# Patient Record
Sex: Female | Born: 1970 | Race: Black or African American | Hispanic: No | Marital: Married | Smoking: Never smoker
Health system: Southern US, Community
[De-identification: ages and names within clinical notes are randomized; demographics above are authoritative.]

## PROBLEM LIST (undated history)

## (undated) DIAGNOSIS — G9340 Encephalopathy, unspecified: Secondary | ICD-10-CM

## (undated) DIAGNOSIS — E876 Hypokalemia: Secondary | ICD-10-CM

## (undated) DIAGNOSIS — G40901 Epilepsy, unspecified, not intractable, with status epilepticus: Secondary | ICD-10-CM

## (undated) DIAGNOSIS — Z86718 Personal history of other venous thrombosis and embolism: Secondary | ICD-10-CM

## (undated) DIAGNOSIS — R413 Other amnesia: Secondary | ICD-10-CM

## (undated) DIAGNOSIS — G919 Hydrocephalus, unspecified: Secondary | ICD-10-CM

## (undated) DIAGNOSIS — N179 Acute kidney failure, unspecified: Secondary | ICD-10-CM

## (undated) DIAGNOSIS — R55 Syncope and collapse: Secondary | ICD-10-CM

## (undated) DIAGNOSIS — F419 Anxiety disorder, unspecified: Secondary | ICD-10-CM

## (undated) DIAGNOSIS — K219 Gastro-esophageal reflux disease without esophagitis: Secondary | ICD-10-CM

## (undated) DIAGNOSIS — E46 Unspecified protein-calorie malnutrition: Secondary | ICD-10-CM

## (undated) DIAGNOSIS — E559 Vitamin D deficiency, unspecified: Secondary | ICD-10-CM

## (undated) DIAGNOSIS — A419 Sepsis, unspecified organism: Secondary | ICD-10-CM

## (undated) DIAGNOSIS — N289 Disorder of kidney and ureter, unspecified: Secondary | ICD-10-CM

## (undated) DIAGNOSIS — J9600 Acute respiratory failure, unspecified whether with hypoxia or hypercapnia: Secondary | ICD-10-CM

## (undated) DIAGNOSIS — R0902 Hypoxemia: Secondary | ICD-10-CM

## (undated) DIAGNOSIS — R000 Tachycardia, unspecified: Secondary | ICD-10-CM

## (undated) DIAGNOSIS — R7401 Elevation of levels of liver transaminase levels: Secondary | ICD-10-CM

## (undated) DIAGNOSIS — R5381 Other malaise: Secondary | ICD-10-CM

## (undated) DIAGNOSIS — E8809 Other disorders of plasma-protein metabolism, not elsewhere classified: Secondary | ICD-10-CM

## (undated) DIAGNOSIS — Z01818 Encounter for other preprocedural examination: Secondary | ICD-10-CM

## (undated) DIAGNOSIS — G43909 Migraine, unspecified, not intractable, without status migrainosus: Secondary | ICD-10-CM

## (undated) DIAGNOSIS — R0682 Tachypnea, not elsewhere classified: Secondary | ICD-10-CM

## (undated) DIAGNOSIS — R569 Unspecified convulsions: Secondary | ICD-10-CM

## (undated) DIAGNOSIS — I10 Essential (primary) hypertension: Secondary | ICD-10-CM

## (undated) DIAGNOSIS — R6510 Systemic inflammatory response syndrome (SIRS) of non-infectious origin without acute organ dysfunction: Secondary | ICD-10-CM

## (undated) DIAGNOSIS — Z9289 Personal history of other medical treatment: Secondary | ICD-10-CM

## (undated) DIAGNOSIS — K5900 Constipation, unspecified: Secondary | ICD-10-CM

## (undated) DIAGNOSIS — I609 Nontraumatic subarachnoid hemorrhage, unspecified: Secondary | ICD-10-CM

## (undated) DIAGNOSIS — Z931 Gastrostomy status: Secondary | ICD-10-CM

## (undated) DIAGNOSIS — R638 Other symptoms and signs concerning food and fluid intake: Secondary | ICD-10-CM

## (undated) DIAGNOSIS — T85528A Displacement of other gastrointestinal prosthetic devices, implants and grafts, initial encounter: Secondary | ICD-10-CM

## (undated) DIAGNOSIS — M549 Dorsalgia, unspecified: Secondary | ICD-10-CM

## (undated) DIAGNOSIS — R0989 Other specified symptoms and signs involving the circulatory and respiratory systems: Secondary | ICD-10-CM

## (undated) DIAGNOSIS — D62 Acute posthemorrhagic anemia: Secondary | ICD-10-CM

## (undated) HISTORY — DX: Other disorders of plasma-protein metabolism, not elsewhere classified: E88.09

## (undated) HISTORY — DX: Other symptoms and signs concerning food and fluid intake: R63.8

## (undated) HISTORY — DX: Encephalopathy, unspecified: G93.40

## (undated) HISTORY — DX: Gastrostomy status: Z93.1

## (undated) HISTORY — DX: Gastro-esophageal reflux disease without esophagitis: K21.9

## (undated) HISTORY — DX: Acute kidney failure, unspecified: N17.9

## (undated) HISTORY — DX: Epilepsy, unspecified, not intractable, with status epilepticus: G40.901

## (undated) HISTORY — DX: Anxiety disorder, unspecified: F41.9

## (undated) HISTORY — DX: Personal history of other venous thrombosis and embolism: Z86.718

## (undated) HISTORY — DX: Elevation of levels of liver transaminase levels: R74.01

## (undated) HISTORY — DX: Dorsalgia, unspecified: M54.9

## (undated) HISTORY — DX: Other specified symptoms and signs involving the circulatory and respiratory systems: R09.89

## (undated) HISTORY — DX: Unspecified protein-calorie malnutrition: E46

## (undated) HISTORY — DX: Tachycardia, unspecified: R00.0

## (undated) HISTORY — PX: LAPAROSCOPY: SHX197

## (undated) HISTORY — DX: Encounter for other preprocedural examination: Z01.818

## (undated) HISTORY — DX: Acute respiratory failure, unspecified whether with hypoxia or hypercapnia: J96.00

## (undated) HISTORY — DX: Hypoxemia: R09.02

## (undated) HISTORY — DX: Displacement of other gastrointestinal prosthetic devices, implants and grafts, initial encounter: T85.528A

## (undated) HISTORY — DX: Hypokalemia: E87.6

## (undated) HISTORY — DX: Acute posthemorrhagic anemia: D62

## (undated) HISTORY — DX: Personal history of other medical treatment: Z92.89

## (undated) HISTORY — DX: Other malaise: R53.81

## (undated) HISTORY — DX: Syncope and collapse: R55

## (undated) HISTORY — DX: Systemic inflammatory response syndrome (sirs) of non-infectious origin without acute organ dysfunction: R65.10

## (undated) HISTORY — DX: Tachypnea, not elsewhere classified: R06.82

## (undated) HISTORY — DX: Hydrocephalus, unspecified: G91.9

## (undated) HISTORY — DX: Constipation, unspecified: K59.00

## (undated) HISTORY — DX: Vitamin D deficiency, unspecified: E55.9

## (undated) HISTORY — DX: Migraine, unspecified, not intractable, without status migrainosus: G43.909

## (undated) HISTORY — DX: Disorder of kidney and ureter, unspecified: N28.9

## (undated) HISTORY — DX: Sepsis, unspecified organism: A41.9

---

## 2006-12-02 ENCOUNTER — Ambulatory Visit: Payer: Self-pay | Admitting: Family Medicine

## 2006-12-02 LAB — CONVERTED CEMR LAB
ALT: 13 units/L (ref 0–40)
AST: 20 units/L (ref 0–37)
Alkaline Phosphatase: 67 units/L (ref 39–117)
Basophils Relative: 1.4 % — ABNORMAL HIGH (ref 0.0–1.0)
Bilirubin, Direct: 0.1 mg/dL (ref 0.0–0.3)
Chloride: 110 meq/L (ref 96–112)
Eosinophils Absolute: 0 10*3/uL (ref 0.0–0.6)
Eosinophils Relative: 0.7 % (ref 0.0–5.0)
GFR calc Af Amer: 72 mL/min
GFR calc non Af Amer: 59 mL/min
HCT: 38.3 % (ref 36.0–46.0)
HDL: 42.9 mg/dL (ref >39.0)
Lymphocytes Relative: 33.5 % (ref 12.0–46.0)
Monocytes Relative: 3.2 % (ref 3.0–11.0)
Neutro Abs: 4.1 10*3/uL (ref 1.4–7.7)
Platelets: 228 10*3/uL (ref 150–400)
Potassium: 4 meq/L (ref 3.5–5.1)
Total Bilirubin: 0.9 mg/dL (ref 0.3–1.2)
Total CHOL/HDL Ratio: 4.5
Triglycerides: 60 mg/dL (ref 0–149)
VLDL: 12 mg/dL (ref 0–40)

## 2008-06-16 ENCOUNTER — Ambulatory Visit: Payer: Self-pay | Admitting: Family Medicine

## 2008-06-16 DIAGNOSIS — G43009 Migraine without aura, not intractable, without status migrainosus: Secondary | ICD-10-CM

## 2008-06-16 DIAGNOSIS — R0789 Other chest pain: Secondary | ICD-10-CM

## 2008-06-16 HISTORY — DX: Migraine without aura, not intractable, without status migrainosus: G43.009

## 2008-06-16 HISTORY — DX: Other chest pain: R07.89

## 2009-10-04 ENCOUNTER — Ambulatory Visit (HOSPITAL_BASED_OUTPATIENT_CLINIC_OR_DEPARTMENT_OTHER): Admission: RE | Admit: 2009-10-04 | Discharge: 2009-10-04 | Payer: Self-pay | Admitting: Family Medicine

## 2009-10-04 ENCOUNTER — Ambulatory Visit: Payer: Self-pay | Admitting: Diagnostic Radiology

## 2012-01-06 ENCOUNTER — Encounter (HOSPITAL_BASED_OUTPATIENT_CLINIC_OR_DEPARTMENT_OTHER): Payer: Self-pay | Admitting: *Deleted

## 2012-01-06 ENCOUNTER — Emergency Department (HOSPITAL_BASED_OUTPATIENT_CLINIC_OR_DEPARTMENT_OTHER)
Admission: EM | Admit: 2012-01-06 | Discharge: 2012-01-06 | Disposition: A | Discharge status: Home / Self Care | Payer: Managed Care, Other (non HMO) | Attending: Emergency Medicine | Admitting: Emergency Medicine

## 2012-01-06 DIAGNOSIS — S239XXA Sprain of unspecified parts of thorax, initial encounter: Secondary | ICD-10-CM | POA: Insufficient documentation

## 2012-01-06 DIAGNOSIS — S39012A Strain of muscle, fascia and tendon of lower back, initial encounter: Secondary | ICD-10-CM

## 2012-01-06 DIAGNOSIS — X503XXA Overexertion from repetitive movements, initial encounter: Secondary | ICD-10-CM | POA: Insufficient documentation

## 2012-01-06 DIAGNOSIS — S335XXA Sprain of ligaments of lumbar spine, initial encounter: Secondary | ICD-10-CM | POA: Insufficient documentation

## 2012-01-06 DIAGNOSIS — I10 Essential (primary) hypertension: Secondary | ICD-10-CM | POA: Insufficient documentation

## 2012-01-06 HISTORY — DX: Essential (primary) hypertension: I10

## 2012-01-06 MED ORDER — PREDNISONE 50 MG PO TABS: 50.0000 mg | ORAL_TABLET | Freq: Every day | ORAL | Status: AC

## 2012-01-06 MED ORDER — HYDROCODONE-ACETAMINOPHEN 5-325 MG PO TABS: 1.0000 | ORAL_TABLET | Freq: Four times a day (QID) | ORAL | Status: AC | PRN

## 2012-01-06 NOTE — Discharge Instructions (Signed)
Return here as needed. Use ice and heat on your back. Follow up with your doctor for a recheck.

## 2012-01-06 NOTE — ED Notes (Signed)
Pt complains of injuring her back on Wednesday when she was lifting and moving some heavy bags has been using flexeril etc but not getting any better. Pt ambulatory to exam room

## 2012-01-06 NOTE — ED Provider Notes (Signed)
History     CSN: [id number]  Arrival date & time 01/06/12  0800   First MD Initiated Contact with Patient 01/06/12 [phone]      Chief Complaint  Patient presents with  . Back Pain    HPI The patient presents to the ER with back pain that began 5 days ago. She states that she bent over to pick up some bags and then noted the pain began at that time. She states that there is pain mainly on the R side starting in th mid back and will radiate down to the bottom of her back. The patient denies CP, SOB, weakness, numbness, bowel/bladder incontinence, dysuria, N/V, diarrhea, neck pain, abd pain or fever. The patient has only tried flexeril but only got minimal relief. Past Medical History  Diagnosis Date  . Hypertension     Past Surgical History  Procedure Date  . Cesarean section     History reviewed. No pertinent family history.  History  Substance Use Topics  . Smoking status: Never Smoker   . Smokeless tobacco: Not on file  . Alcohol Use: Yes     occ    OB History    Grav Para Term Preterm Abortions TAB SAB Ect Mult Living                  Review of Systems All pertinent positives and negatives reviewed in the history of present illness  Allergies  Review of patient's allergies indicates no known allergies.  Home Medications  No current outpatient prescriptions on file.  BP 135/82  Pulse 73  Temp(Src) 97.7 F (36.5 C) (Oral)  Resp 20  Ht 5\' 2"  (1.575 m)  Wt 215 lb (97.523 kg)  BMI 39.32 kg/m2  SpO2 100%  Physical Exam  Nursing note and vitals reviewed. Constitutional: She is oriented to person, place, and time. She appears well-developed and well-nourished. No distress.  HENT:  Head: Normocephalic and atraumatic.  Eyes: Pupils are equal, round, and reactive to light.  Cardiovascular: Normal rate, regular rhythm and normal heart sounds.  Exam reveals no gallop and no friction rub.   No murmur heard. Pulmonary/Chest: Effort normal and breath sounds normal.  No respiratory distress. She has no wheezes.  Musculoskeletal:       Thoracic back: She exhibits tenderness, pain and spasm. She exhibits no bony tenderness, no swelling, no edema and no deformity.       Back:  Neurological: She is alert and oriented to person, place, and time. She has normal strength. No sensory deficit. Coordination and gait normal.  Reflex Scores:      Patellar reflexes are 2+ on the right side and 2+ on the left side.      Achilles reflexes are 2+ on the right side and 2+ on the left side. Skin: Skin is warm and dry. No rash noted.    ED Course  Procedures (including critical care time)  The patient most likely has a thoracic and lumbar back strain based on her HPI and PE. The patient is explained that she has strained the muscle by bending over picking up the bags. She states that she has no issues with walking or coordination. The patient is explained the relative rest concept and to use ice and heat on her back. I advised her to use her Flexeril at night right before bed. The patients questions were answered and she voiced an understanding of the plan. She is advised to follow up with her PCP as  well. No imaging was performed based on her HPI and PE findings.   MDM          Carlyle Dolly, PA-C 01/06/12 [phone]

## 2012-01-06 NOTE — ED Notes (Signed)
Patient reports she was lifting some bags on Wednesday and hurt her back.  Been attempting Flexeril, Advil and heat with no relief since then.

## 2012-01-06 NOTE — ED Provider Notes (Signed)
Medical screening examination/treatment/procedure(s) were performed by non-physician practitioner and as supervising physician I was immediately available for consultation/collaboration.   Joya Gaskins, MD 01/06/12 [phone]

## 2012-05-01 ENCOUNTER — Other Ambulatory Visit (HOSPITAL_BASED_OUTPATIENT_CLINIC_OR_DEPARTMENT_OTHER): Payer: Self-pay | Admitting: Family Medicine

## 2012-05-01 DIAGNOSIS — Z139 Encounter for screening, unspecified: Secondary | ICD-10-CM

## 2012-05-08 ENCOUNTER — Ambulatory Visit (HOSPITAL_BASED_OUTPATIENT_CLINIC_OR_DEPARTMENT_OTHER)
Admission: RE | Admit: 2012-05-08 | Discharge: 2012-05-08 | Disposition: A | Discharge status: Home / Self Care | Payer: Managed Care, Other (non HMO) | Source: Ambulatory Visit | Attending: Family Medicine | Admitting: Family Medicine

## 2012-05-08 DIAGNOSIS — Z1231 Encounter for screening mammogram for malignant neoplasm of breast: Secondary | ICD-10-CM | POA: Insufficient documentation

## 2012-05-08 DIAGNOSIS — Z139 Encounter for screening, unspecified: Secondary | ICD-10-CM

## 2012-12-31 ENCOUNTER — Encounter: Payer: Self-pay | Admitting: *Deleted

## 2012-12-31 DIAGNOSIS — I10 Essential (primary) hypertension: Secondary | ICD-10-CM

## 2012-12-31 DIAGNOSIS — K219 Gastro-esophageal reflux disease without esophagitis: Secondary | ICD-10-CM

## 2012-12-31 HISTORY — DX: Essential (primary) hypertension: I10

## 2013-02-01 ENCOUNTER — Ambulatory Visit: Payer: Managed Care, Other (non HMO) | Admitting: Family Medicine

## 2013-02-01 DIAGNOSIS — Z0289 Encounter for other administrative examinations: Secondary | ICD-10-CM

## 2013-07-23 ENCOUNTER — Ambulatory Visit (INDEPENDENT_AMBULATORY_CARE_PROVIDER_SITE_OTHER): Payer: Commercial Indemnity | Admitting: Family Medicine

## 2013-07-23 ENCOUNTER — Encounter (INDEPENDENT_AMBULATORY_CARE_PROVIDER_SITE_OTHER): Payer: Self-pay

## 2013-07-23 ENCOUNTER — Encounter: Payer: Self-pay | Admitting: Family Medicine

## 2013-07-23 VITALS — BP 126/91 | HR 78 | Resp 16 | Ht 63.0 in | Wt 226.0 lb

## 2013-07-23 DIAGNOSIS — E559 Vitamin D deficiency, unspecified: Secondary | ICD-10-CM

## 2013-07-23 DIAGNOSIS — M722 Plantar fascial fibromatosis: Secondary | ICD-10-CM

## 2013-07-23 DIAGNOSIS — Z0000 Encounter for general adult medical examination without abnormal findings: Secondary | ICD-10-CM

## 2013-07-23 DIAGNOSIS — R7301 Impaired fasting glucose: Secondary | ICD-10-CM

## 2013-07-23 DIAGNOSIS — E669 Obesity, unspecified: Secondary | ICD-10-CM

## 2013-07-23 DIAGNOSIS — I10 Essential (primary) hypertension: Secondary | ICD-10-CM

## 2013-07-23 MED ORDER — TRIAMTERENE-HCTZ 37.5-25 MG PO TABS: 1.0000 | ORAL_TABLET | Freq: Every day | ORAL | Status: DC

## 2013-07-23 MED ORDER — VITAMIN D (ERGOCALCIFEROL) 1.25 MG (50000 UNIT) PO CAPS: ORAL_CAPSULE | ORAL | Status: AC

## 2013-07-23 MED ORDER — PHENTERMINE-TOPIRAMATE ER 11.25-69 MG PO CP24: 1.0000 | ORAL_CAPSULE | Freq: Every day | ORAL | Status: DC

## 2013-07-23 MED ORDER — NABUMETONE 750 MG PO TABS: 750.0000 mg | ORAL_TABLET | Freq: Two times a day (BID) | ORAL | Status: AC | PRN

## 2013-07-23 NOTE — Patient Instructions (Signed)
1)  Pain - Vitamin D and the Relafen.  If your feel don't improve contact Dr. Ralene Cork.    2)  Weight - Get back on your diet  3)  Labs -  F/U in a month for a CPE and to go over lab results.    Plantar Fasciitis (Heel Spur Syndrome) with Rehab The plantar fascia is a fibrous, ligament-like, soft-tissue structure that spans the bottom of the foot. Plantar fasciitis is a condition that causes pain in the foot due to inflammation of the tissue. SYMPTOMS   Pain and tenderness on the underneath side of the foot.  Pain that worsens with standing or walking. CAUSES  Plantar fasciitis is caused by irritation and injury to the plantar fascia on the underneath side of the foot. Common mechanisms of injury include:  Direct trauma to bottom of the foot.  Damage to a small nerve that runs under the foot where the main fascia attaches to the heel bone.  Stress placed on the plantar fascia due to bone spurs. RISK INCREASES WITH:   Activities that place stress on the plantar fascia (running, jumping, pivoting, or cutting).  Poor strength and flexibility.  Improperly fitted shoes.  Tight calf muscles.  Flat feet.  Failure to warm-up properly before activity.  Obesity. PREVENTION  Warm up and stretch properly before activity.  Allow for adequate recovery between workouts.  Maintain physical fitness:  Strength, flexibility, and endurance.  Cardiovascular fitness.  Maintain a health body weight.  Avoid stress on the plantar fascia.  Wear properly fitted shoes, including arch supports for individuals who have flat feet. PROGNOSIS  If treated properly, then the symptoms of plantar fasciitis usually resolve without surgery. However, occasionally surgery is necessary. RELATED COMPLICATIONS   Recurrent symptoms that may result in a chronic condition.  Problems of the lower back that are caused by compensating for the injury, such as limping.  Pain or weakness of the foot during  push-off following surgery.  Chronic inflammation, scarring, and partial or complete fascia tear, occurring more often from repeated injections. TREATMENT  Treatment initially involves the use of ice and medication to help reduce pain and inflammation. The use of strengthening and stretching exercises may help reduce pain with activity, especially stretches of the Achilles tendon. These exercises may be performed at home or with a therapist. Your caregiver may recommend that you use heel cups of arch supports to help reduce stress on the plantar fascia. Occasionally, corticosteroid injections are given to reduce inflammation. If symptoms persist for greater than 6 months despite non-surgical (conservative), then surgery may be recommended.  MEDICATION   If pain medication is necessary, then nonsteroidal anti-inflammatory medications, such as aspirin and ibuprofen, or other minor pain relievers, such as acetaminophen, are often recommended.  Do not take pain medication within 7 days before surgery.  Prescription pain relievers may be given if deemed necessary by your caregiver. Use only as directed and only as much as you need.  Corticosteroid injections may be given by your caregiver. These injections should be reserved for the most serious cases, because they may only be given a certain number of times. HEAT AND COLD  Cold treatment (icing) relieves pain and reduces inflammation. Cold treatment should be applied for 10 to 15 minutes every 2 to 3 hours for inflammation and pain and immediately after any activity that aggravates your symptoms. Use ice packs or massage the area with a piece of ice (ice massage).  Heat treatment may be used prior  to performing the stretching and strengthening activities prescribed by your caregiver, physical therapist, or athletic trainer. Use a heat pack or soak the injury in warm water. SEEK IMMEDIATE MEDICAL CARE IF:  Treatment seems to offer no benefit, or the  condition worsens.  Any medications produce adverse side effects. EXERCISES RANGE OF MOTION (ROM) AND STRETCHING EXERCISES - Plantar Fasciitis (Heel Spur Syndrome) These exercises may help you when beginning to rehabilitate your injury. Your symptoms may resolve with or without further involvement from your physician, physical therapist or athletic trainer. While completing these exercises, remember:   Restoring tissue flexibility helps normal motion to return to the joints. This allows healthier, less painful movement and activity.  An effective stretch should be held for at least 30 seconds.  A stretch should never be painful. You should only feel a gentle lengthening or release in the stretched tissue. RANGE OF MOTION - Toe Extension, Flexion  Sit with your right / left leg crossed over your opposite knee.  Grasp your toes and gently pull them back toward the top of your foot. You should feel a stretch on the bottom of your toes and/or foot.  Hold this stretch for __________ seconds.  Now, gently pull your toes toward the bottom of your foot. You should feel a stretch on the top of your toes and or foot.  Hold this stretch for __________ seconds. Repeat __________ times. Complete this stretch __________ times per day.  RANGE OF MOTION - Ankle Dorsiflexion, Active Assisted  Remove shoes and sit on a chair that is preferably not on a carpeted surface.  Place right / left foot under knee. Extend your opposite leg for support.  Keeping your heel down, slide your right / left foot back toward the chair until you feel a stretch at your ankle or calf. If you do not feel a stretch, slide your bottom forward to the edge of the chair, while still keeping your heel down.  Hold this stretch for __________ seconds. Repeat __________ times. Complete this stretch __________ times per day.  STRETCH  Gastroc, Standing  Place hands on wall.  Extend right / left leg, keeping the front knee  somewhat bent.  Slightly point your toes inward on your back foot.  Keeping your right / left heel on the floor and your knee straight, shift your weight toward the wall, not allowing your back to arch.  You should feel a gentle stretch in the right / left calf. Hold this position for __________ seconds. Repeat __________ times. Complete this stretch __________ times per day. STRETCH  Soleus, Standing  Place hands on wall.  Extend right / left leg, keeping the other knee somewhat bent.  Slightly point your toes inward on your back foot.  Keep your right / left heel on the floor, bend your back knee, and slightly shift your weight over the back leg so that you feel a gentle stretch deep in your back calf.  Hold this position for __________ seconds. Repeat __________ times. Complete this stretch __________ times per day. STRETCH  Gastrocsoleus, Standing  Note: This exercise can place a lot of stress on your foot and ankle. Please complete this exercise only if specifically instructed by your caregiver.   Place the ball of your right / left foot on a step, keeping your other foot firmly on the same step.  Hold on to the wall or a rail for balance.  Slowly lift your other foot, allowing your body weight to press  your heel down over the edge of the step.  You should feel a stretch in your right / left calf.  Hold this position for __________ seconds.  Repeat this exercise with a slight bend in your right / left knee. Repeat __________ times. Complete this stretch __________ times per day.  STRENGTHENING EXERCISES - Plantar Fasciitis (Heel Spur Syndrome)  These exercises may help you when beginning to rehabilitate your injury. They may resolve your symptoms with or without further involvement from your physician, physical therapist or athletic trainer. While completing these exercises, remember:   Muscles can gain both the endurance and the strength needed for everyday activities  through controlled exercises.  Complete these exercises as instructed by your physician, physical therapist or athletic trainer. Progress the resistance and repetitions only as guided. STRENGTH - Towel Curls  Sit in a chair positioned on a non-carpeted surface.  Place your foot on a towel, keeping your heel on the floor.  Pull the towel toward your heel by only curling your toes. Keep your heel on the floor.  If instructed by your physician, physical therapist or athletic trainer, add ____________________ at the end of the towel. Repeat __________ times. Complete this exercise __________ times per day. STRENGTH - Ankle Inversion  Secure one end of a rubber exercise band/tubing to a fixed object (table, pole). Loop the other end around your foot just before your toes.  Place your fists between your knees. This will focus your strengthening at your ankle.  Slowly, pull your big toe up and in, making sure the band/tubing is positioned to resist the entire motion.  Hold this position for __________ seconds.  Have your muscles resist the band/tubing as it slowly pulls your foot back to the starting position. Repeat __________ times. Complete this exercises __________ times per day.  Document Released: 09/02/2005 Document Revised: 11/25/2011 Document Reviewed: 12/15/2008 [facility] Patient Information 2014 [place], [place].

## 2013-07-23 NOTE — Progress Notes (Signed)
Subjective:    Patient ID: Carly [name], female    DOB: Dec 04, 1970, 42 y.o.   MRN: [id number]  HPI  Joycelyn is here today to discuss a couple of issues.  She continues to struggle with her weight.  She took Qsymia last year and did well with her weight loss.  Since stopping the Qsymia, she has gained 19 lbs.  She would like to get back on her Qsymia.  She also wants to start back dieting and exercising. Since her last visit, she has developed pain in her right foot.  It is very painful when she steps out of bed each morning.    Review of Systems  Constitutional: Negative.   HENT: Negative.   Eyes: Negative.   Respiratory: Negative.   Cardiovascular: Negative.   Gastrointestinal: Negative.   Endocrine: Negative.   Genitourinary: Negative.   Musculoskeletal:       Pain/Mild Swelling in Right Leg; Pain in foot when she gets out of bed in the morning.    Skin: Negative.   Allergic/Immunologic: Negative.   Neurological: Negative.   Hematological: Negative.   Psychiatric/Behavioral: Negative.      Past Medical History  Diagnosis Date  . Hypertension   . GERD (gastroesophageal reflux disease)   . Migraines       Family History  Problem Relation Age of Onset  . Diabetes Mother   . Hypertension Mother   . Hyperlipidemia Mother   . Cancer Father     colon cancer  . Diabetes Maternal Grandmother   . Hyperlipidemia Paternal Grandfather       History   Social History Narrative   Marital Status: Married Dorene Sorrow)   Children:  Son (1) Daughter (1)    Pets: None   Living Situation: Lives with husband and children.   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 Constitutional: She is oriented to person, place, and time. She appears well-developed and well-nourished.   Cardiovascular: Normal rate and regular rhythm.   Pulmonary/Chest: Effort normal and breath sounds normal.  Neurological: She is alert and oriented to person, place, and time.  Skin: Skin is warm and dry.  Psychiatric: She has a normal mood and affect.     Assessment & Plan:    Charisse was seen today for foot pain and obesity.  Diagnoses and associated orders for this visit:  Plantar fasciitis, bilateral - nabumetone (RELAFEN) 750 MG tablet; Take 1 tablet (750 mg total) by mouth 2 (two) times daily as needed for mild pain.  Essential hypertension, benign - triamterene-hydrochlorothiazide (MAXZIDE-25) 37.5-25 MG per tablet; Take 1 tablet by mouth daily.  Obesity, unspecified - Phentermine-Topiramate 11.25-69 MG CP24; Take 1 capsule by mouth daily.  Impaired fasting glucose - Hemoglobin A1c - Comp Met (CMET)  Unspecified vitamin D deficiency - Vitamin D, Ergocalciferol, (DRISDOL) 50000 UNITS CAPS capsule; Take 1 capsule 3 times per week (M/W/F)  Routine general medical examination at a health care facility (She is to return for a CPE and to go over her lab results).   - CBC with Differential - TSH - Lipid panel  TIME SPENT "FACE TO FACE" WITH PATIENT -  30 MINS

## 2013-07-24 LAB — COMPREHENSIVE METABOLIC PANEL
ALT: 17 IU/L (ref 0–32)
AST: 20 IU/L (ref 0–40)
Albumin/Globulin Ratio: 2 (ref 1.1–2.5)
Albumin: 4.7 g/dL (ref 3.5–5.5)
Alkaline Phosphatase: 81 IU/L (ref 39–117)
BUN/Creatinine Ratio: 9 (ref 9–23)
BUN: 12 mg/dL (ref 6–24)
CO2: 30 mmol/L — ABNORMAL HIGH (ref 18–29)
Calcium: 9.7 mg/dL (ref 8.7–10.2)
Chloride: 97 mmol/L (ref 97–108)
Creatinine, Ser: 1.27 mg/dL — ABNORMAL HIGH (ref 0.57–1.00)
GFR calc Af Amer: 59 mL/min/{1.73_m2} — ABNORMAL LOW (ref >59)
GFR calc non Af Amer: 51 mL/min/{1.73_m2} — ABNORMAL LOW (ref >59)
Globulin, Total: 2.4 g/dL (ref 1.5–4.5)
Glucose: 79 mg/dL (ref 65–99)
Potassium: 3.6 mmol/L (ref 3.5–5.2)
Sodium: 141 mmol/L (ref 134–144)
Total Bilirubin: 0.4 mg/dL (ref 0.0–1.2)
Total Protein: 7.1 g/dL (ref 6.0–8.5)

## 2013-07-24 LAB — CBC WITH DIFFERENTIAL/PLATELET
Basophils Absolute: 0 10*3/uL (ref 0.0–0.2)
Basos: 0 %
Eos: 1 %
Eosinophils Absolute: 0 10*3/uL (ref 0.0–0.4)
HCT: 39.7 % (ref 34.0–46.6)
Hemoglobin: 13.7 g/dL (ref 11.1–15.9)
Immature Grans (Abs): 0 10*3/uL (ref 0.0–0.1)
Immature Granulocytes: 0 %
Lymphocytes Absolute: 2.2 10*3/uL (ref 0.7–3.1)
Lymphs: 33 %
MCH: 28.4 pg (ref 26.6–33.0)
MCHC: 34.5 g/dL (ref 31.5–35.7)
MCV: 82 fL (ref 79–97)
Monocytes Absolute: 0.4 10*3/uL (ref 0.1–0.9)
Monocytes: 6 %
Neutrophils Absolute: 4.1 10*3/uL (ref 1.4–7.0)
Neutrophils Relative %: 60 %
RBC: 4.83 x10E6/uL (ref 3.77–5.28)
RDW: 14.3 % (ref 12.3–15.4)
WBC: 6.7 10*3/uL (ref 3.4–10.8)

## 2013-07-24 LAB — HEMOGLOBIN A1C
Est. average glucose Bld gHb Est-mCnc: 123 mg/dL
Hgb A1c MFr Bld: 5.9 % — ABNORMAL HIGH (ref 4.8–5.6)

## 2013-07-24 LAB — LIPID PANEL
Chol/HDL Ratio: 3.4 ratio units (ref 0.0–4.4)
Cholesterol, Total: 204 mg/dL — ABNORMAL HIGH (ref 100–199)
HDL: 60 mg/dL (ref >39)
LDL Calculated: 128 mg/dL — ABNORMAL HIGH (ref 0–99)
Triglycerides: 81 mg/dL (ref 0–149)
VLDL Cholesterol Cal: 16 mg/dL (ref 5–40)

## 2013-07-24 LAB — TSH: TSH: 1.41 u[IU]/mL (ref 0.450–4.500)

## 2013-08-23 ENCOUNTER — Ambulatory Visit: Payer: Commercial Indemnity | Admitting: Family Medicine

## 2013-08-23 ENCOUNTER — Encounter: Payer: Self-pay | Admitting: Family Medicine

## 2013-08-23 DIAGNOSIS — E6609 Other obesity due to excess calories: Secondary | ICD-10-CM

## 2013-08-23 DIAGNOSIS — R7301 Impaired fasting glucose: Secondary | ICD-10-CM | POA: Insufficient documentation

## 2013-08-23 DIAGNOSIS — E559 Vitamin D deficiency, unspecified: Secondary | ICD-10-CM | POA: Insufficient documentation

## 2013-08-23 DIAGNOSIS — M722 Plantar fascial fibromatosis: Secondary | ICD-10-CM

## 2013-08-23 DIAGNOSIS — Z0000 Encounter for general adult medical examination without abnormal findings: Secondary | ICD-10-CM | POA: Insufficient documentation

## 2013-08-23 HISTORY — DX: Impaired fasting glucose: R73.01

## 2013-08-23 HISTORY — DX: Plantar fascial fibromatosis: M72.2

## 2013-08-23 HISTORY — DX: Other obesity due to excess calories: E66.09

## 2013-08-26 ENCOUNTER — Other Ambulatory Visit: Payer: Self-pay | Admitting: Family Medicine

## 2013-08-26 DIAGNOSIS — Z1231 Encounter for screening mammogram for malignant neoplasm of breast: Secondary | ICD-10-CM

## 2013-08-30 ENCOUNTER — Ambulatory Visit: Payer: Commercial Indemnity | Admitting: Family Medicine

## 2013-09-01 ENCOUNTER — Ambulatory Visit (HOSPITAL_BASED_OUTPATIENT_CLINIC_OR_DEPARTMENT_OTHER)
Admission: RE | Admit: 2013-09-01 | Discharge: 2013-09-01 | Disposition: A | Discharge status: Home / Self Care | Payer: Commercial Indemnity | Source: Ambulatory Visit | Attending: Family Medicine | Admitting: Family Medicine

## 2013-09-01 ENCOUNTER — Ambulatory Visit (HOSPITAL_BASED_OUTPATIENT_CLINIC_OR_DEPARTMENT_OTHER): Payer: Commercial Indemnity

## 2013-09-01 DIAGNOSIS — Z1231 Encounter for screening mammogram for malignant neoplasm of breast: Secondary | ICD-10-CM | POA: Insufficient documentation

## 2013-09-02 ENCOUNTER — Ambulatory Visit (INDEPENDENT_AMBULATORY_CARE_PROVIDER_SITE_OTHER): Payer: Commercial Indemnity | Admitting: Family Medicine

## 2013-09-02 ENCOUNTER — Encounter: Payer: Self-pay | Admitting: Family Medicine

## 2013-09-02 VITALS — BP 117/81 | HR 80 | Resp 16 | Wt 224.0 lb

## 2013-09-02 DIAGNOSIS — R799 Abnormal finding of blood chemistry, unspecified: Secondary | ICD-10-CM

## 2013-09-02 DIAGNOSIS — E669 Obesity, unspecified: Secondary | ICD-10-CM

## 2013-09-02 DIAGNOSIS — IMO0002 Reserved for concepts with insufficient information to code with codable children: Secondary | ICD-10-CM

## 2013-09-02 DIAGNOSIS — R7989 Other specified abnormal findings of blood chemistry: Secondary | ICD-10-CM

## 2013-09-02 DIAGNOSIS — E785 Hyperlipidemia, unspecified: Secondary | ICD-10-CM

## 2013-09-02 DIAGNOSIS — R7301 Impaired fasting glucose: Secondary | ICD-10-CM

## 2013-09-02 MED ORDER — PHENTERMINE-TOPIRAMATE ER 11.25-69 MG PO CP24: 1.0000 | ORAL_CAPSULE | Freq: Every day | ORAL | Status: AC

## 2013-09-02 NOTE — Patient Instructions (Signed)
1)  Creatinine - Drink a lot of fluid, limit your Ibuprofen and Relafen and get your lab in 6 weeks.

## 2013-09-02 NOTE — Progress Notes (Signed)
Subjective:    Patient ID: Carly [name], female    DOB: May 01, 1971, 42 y.o.   MRN: [id number]  HPI  Carly [name] is here today to go over her most recent lab results.  She has done well since her last office visit.  She was complaining of plantar fasciitis at her last visit.  She has been taking Relafen and her pain has improved.  She would like to continue on Qsymia.  She has been trying to work harder on her diet and exercise.    Review of Systems  Constitutional: Negative for unexpected weight change.  All other systems reviewed and are negative.   Past Medical History  Diagnosis Date  . Hypertension   . GERD (gastroesophageal reflux disease)   . Migraines      Past Surgical History  Procedure Laterality Date  . Cesarean section       History   Social History Narrative   Marital Status: Married Dorene Sorrow)   Children:  Son (1) Daughter (1)    Pets: None   Living Situation: Lives with husband and children.   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 Diabetes Mother   . Hypertension Mother   . Hyperlipidemia Mother   . Cancer Father     colon cancer  . Diabetes Maternal Grandmother   . Hyperlipidemia Paternal Grandfather      Current Outpatient Prescriptions on File Prior to Visit  Medication Sig Dispense Refill  . nabumetone (RELAFEN) 750 MG tablet Take 1 tablet (750 mg total) by mouth 2 (two) times daily as needed for mild pain.  60 tablet  2  . triamterene-hydrochlorothiazide (MAXZIDE-25) 37.5-25 MG per tablet Take 1 tablet by mouth daily.  90 tablet  3  . Vitamin D, Ergocalciferol, (DRISDOL) 50000 UNITS CAPS capsule Take 1 capsule 3 times per week (M/W/F)  12 capsule  11   No current facility-administered medications on file prior to visit.      No Known Allergies       Objective:   Physical Exam  Constitutional: She appears well-nourished. No distress.  HENT:  Head: Normocephalic.  Eyes: No scleral icterus.  Neck: No thyromegaly present.  Cardiovascular: Normal rate, regular rhythm and normal heart sounds.   Pulmonary/Chest: Effort normal and breath sounds normal.  Abdominal: There is no tenderness.  Musculoskeletal: She exhibits no edema and no tenderness.  Neurological: She is alert.  Skin: Skin is warm and dry.  Psychiatric: She has a normal mood and affect. Her behavior is normal. Judgment and thought content normal.      Assessment & Plan:    Carly [name] was seen today for medication management.  Diagnoses and associated orders for this visit:  Creatinine elevation Comments: Her creatinine is elevated.  She is to decrease her intake of NSAIDS and increase her water intake.  We'll recheck a BMET in 6 weeks.   - Basic Metabolic Panel (BMET); Future  Impaired fasting glucose Comments: Her A1c is elevated at 5.9%.  She will continue to work on diet and exercise.    Other and unspecified hyperlipidemia Comments: Her LDL is elevated at 128.  This is 10 points down from  when it was checked 6 yrs ago.    Obesity, unspecified - Phentermine-Topiramate 11.25-69 MG CP24; Take 1 capsule by mouth daily.   TIME SPENT "FACE TO FACE" WITH PATIENT -  30 MINS

## 2014-04-18 ENCOUNTER — Ambulatory Visit: Payer: Commercial Indemnity | Admitting: Family Medicine

## 2016-11-12 DIAGNOSIS — R7303 Prediabetes: Secondary | ICD-10-CM

## 2016-11-12 DIAGNOSIS — E7800 Pure hypercholesterolemia, unspecified: Secondary | ICD-10-CM

## 2016-11-12 HISTORY — DX: Pure hypercholesterolemia, unspecified: E78.00

## 2016-11-12 HISTORY — DX: Prediabetes: R73.03

## 2017-02-25 ENCOUNTER — Encounter: Payer: Self-pay | Admitting: Physician Assistant

## 2017-02-25 ENCOUNTER — Ambulatory Visit (INDEPENDENT_AMBULATORY_CARE_PROVIDER_SITE_OTHER): Payer: 59 | Admitting: Physician Assistant

## 2017-02-25 VITALS — BP 142/82 | HR 85 | Temp 98.5°F | Resp 18 | Ht 64.5 in | Wt 240.2 lb

## 2017-02-25 DIAGNOSIS — R51 Headache: Secondary | ICD-10-CM | POA: Diagnosis not present

## 2017-02-25 DIAGNOSIS — M542 Cervicalgia: Secondary | ICD-10-CM

## 2017-02-25 DIAGNOSIS — R030 Elevated blood-pressure reading, without diagnosis of hypertension: Secondary | ICD-10-CM

## 2017-02-25 DIAGNOSIS — R079 Chest pain, unspecified: Secondary | ICD-10-CM

## 2017-02-25 DIAGNOSIS — R519 Headache, unspecified: Secondary | ICD-10-CM

## 2017-02-25 LAB — POCT CBC
Granulocyte percent: 64.7 % (ref 37–80)
HCT, POC: 36.1 % — AB (ref 37.7–47.9)
Hemoglobin: 12.7 g/dL (ref 12.2–16.2)
Lymph, poc: 2 (ref 0.6–3.4)
MCH, POC: 28.1 pg (ref 27–31.2)
MCHC: 35.3 g/dL (ref 31.8–35.4)
MCV: 79.5 fL — AB (ref 80–97)
MID (cbc): 0.3 (ref 0–0.9)
MPV: 8.4 fL (ref 0–99.8)
POC Granulocyte: 4.2 (ref 2–6.9)
POC LYMPH PERCENT: 30.6 %L (ref 10–50)
POC MID %: 4.7 %M (ref 0–12)
Platelet Count, POC: 192 10*3/uL (ref 142–424)
RBC: 4.53 M/uL (ref 4.04–5.48)
RDW, POC: 13.6 %
WBC: 6.5 10*3/uL (ref 4.6–10.2)

## 2017-02-25 LAB — GLUCOSE, POCT (MANUAL RESULT ENTRY): POC Glucose: 110 mg/dL — AB (ref 70–99)

## 2017-02-25 MED ORDER — BUTALBITAL-APAP-CAFF-COD 50-325-40-30 MG PO CAPS: 1.0000 | ORAL_CAPSULE | ORAL | 0 refills | Status: DC | PRN

## 2017-02-25 NOTE — Patient Instructions (Addendum)
I am treating you today for a migraine. Please stay home when you take this medication. Do not drive or operate machinery. Follow-up with your PCP.  We can refer you to the headache clinic if your symptoms continue.  Please go to the emergency department if your symptoms worsen or do not improve.   Start exercising regularly - start with at least 150 minutes per week of moderate intensity aerobic exercise, coupled with a reduced saturated fat diet. Reduce saturated fats. Increase fruits and nuts and whole grains, and dramatically reduce intake of process bread and sugar.   Thank you for coming in today. I hope you feel we met your needs.  Feel free to call [facility] if you have any questions or further requests.  Please consider signing up for MyChart if you do not already have it, as this is a great way to communicate with me.  Best,  Whitney McVey, PA-C   Migraine Headache A migraine headache is an intense, throbbing pain on one side or both sides of the head. Migraines may also cause other symptoms, such as nausea, vomiting, and sensitivity to light and noise. What are the causes? Doing or taking certain things may also trigger migraines, such as:  Alcohol.  Smoking.  Medicines, such as: ? Medicine used to treat chest pain (nitroglycerine). ? Birth control pills. ? Estrogen pills. ? Certain blood pressure medicines.  Aged cheeses, chocolate, or caffeine.  Foods or drinks that contain nitrates, glutamate, aspartame, or tyramine.  Physical activity.  Other things that may trigger a migraine include:  Menstruation.  Pregnancy.  Hunger.  Stress, lack of sleep, too much sleep, or fatigue.  Weather changes.  What increases the risk? The following factors may make you more likely to experience migraine headaches:  Age. Risk increases with age.  Family history of migraine headaches.  Being Caucasian.  Depression and anxiety.  Obesity.  Being a woman.  Having a  hole in the heart (patent foramen ovale) or other heart problems.  What are the signs or symptoms? The main symptom of this condition is pulsating or throbbing pain. Pain may:  Happen in any area of the head, such as on one side or both sides.  Interfere with daily activities.  Get worse with physical activity.  Get worse with exposure to bright lights or loud noises.  Other symptoms may include:  Nausea.  Vomiting.  Dizziness.  General sensitivity to bright lights, loud noises, or smells.  Before you get a migraine, you may get warning signs that a migraine is developing (aura). An aura may include:  Seeing flashing lights or having blind spots.  Seeing bright spots, halos, or zigzag lines.  Having tunnel vision or blurred vision.  Having numbness or a tingling feeling.  Having trouble talking.  Having muscle weakness.  How is this diagnosed? A migraine headache can be diagnosed based on:  Your symptoms.  A physical exam.  Tests, such as CT scan or MRI of the head. These imaging tests can help rule out other causes of headaches.  Taking fluid from the spine (lumbar puncture) and analyzing it (cerebrospinal fluid analysis, or CSF analysis).  How is this treated? A migraine headache is usually treated with medicines that:  Relieve pain.  Relieve nausea.  Prevent migraines from coming back.  Treatment may also include:  Acupuncture.  Lifestyle changes like avoiding foods that trigger migraines.  Follow these instructions at home: Medicines  Take over-the-counter and prescription medicines only as told by  your health care provider.  Do not drive or use heavy machinery while taking prescription pain medicine.  To prevent or treat constipation while you are taking prescription pain medicine, your health care provider may recommend that you: ? Drink enough fluid to keep your urine clear or pale yellow. ? Take over-the-counter or prescription  medicines. ? Eat foods that are high in fiber, such as fresh fruits and vegetables, whole grains, and beans. ? Limit foods that are high in fat and processed sugars, such as fried and sweet foods. Lifestyle  Avoid alcohol use.  Do not use any products that contain nicotine or tobacco, such as cigarettes and e-cigarettes. If you need help quitting, ask your health care provider.  Get at least 8 hours of sleep every night.  Limit your stress. General instructions   Keep a journal to find out what may trigger your migraine headaches. For example, write down: ? What you eat and drink. ? How much sleep you get. ? Any change to your diet or medicines.  If you have a migraine: ? Avoid things that make your symptoms worse, such as bright lights. ? It may help to lie down in a dark, quiet room. ? Do not drive or use heavy machinery. ? Ask your health care provider what activities are safe for you while you are experiencing symptoms.  Keep all follow-up visits as told by your health care provider. This is important. Contact a health care provider if:  You develop symptoms that are different or more severe than your usual migraine symptoms. Get help right away if:  Your migraine becomes severe.  You have a fever.  You have a stiff neck.  You have vision loss.  Your muscles feel weak or like you cannot control them.  You start to lose your balance often.  You develop trouble walking.  You faint. This information is not intended to replace advice given to you by your health care provider. Make sure you discuss any questions you have with your health care provider. Document Released: 09/02/2005 Document Revised: 03/22/2016 Document Reviewed: 02/19/2016 Elsevier Interactive Patient Education  2017 [employer].  IF you received an x-ray today, you will receive an invoice from [facility]. Please contact [facility] at [phone] with questions or concerns  regarding your invoice.   IF you received labwork today, you will receive an invoice from [place]. Please contact [facility] at [phone] with questions or concerns regarding your invoice.   Our billing staff will not be able to assist you with questions regarding bills from these companies.  You will be contacted with the lab results as soon as they are available. The fastest way to get your results is to activate your My Chart account. Instructions are located on the last page of this paperwork. If you have not heard from [place] regarding the results in 2 weeks, please contact this office.

## 2017-02-25 NOTE — Progress Notes (Signed)
Carly [name]  MRN: [id number] DOB: 1971/03/06  PCP: [name], MD  Subjective:  Pt is a 46 year old female PMH HTN presents to clinic for chest pain and headache. Symptoms started about one hour ago.  Warm sensation ran up her neck, "it went quiet". She was sitting at her computer at work when this happened. +dizziness and lightheadedness. B/l temples "shooting pain". Head felt "full and my ear got silent". Episode lasted about 10 minutes.  Chest pain in the center of her chest when she started to walk. She currently has chest pain when she moves. Located to the right of center of her chest. She still feels "pressure" on both sides of her head in the temple area and at the base of her neck. Base of her neck hurts worse with movement and pushing on it.   H/o migraines triggered by weather changes. This is not the worst headache she has ever had. She has not taken anything to feel better.  Denies nausea, vomiting,  She took HCTZ this morning.   Her PCP is Dr. Elza Rafter at [facility]. Next appt is in Sept.   Life stressors - Her mother has Parkinson's disease and she is taking care of her mother. This is not new.   Was evaluated by cardiology 4 months ago at [facility] Dr Atilano Median: "everything looked good"  Denies shob, arm pain, near syncope, palpitations, nausea.    Review of Systems  Constitutional: Negative for chills and diaphoresis.  Cardiovascular: Positive for chest pain. Negative for palpitations.  Musculoskeletal: Positive for neck pain. Negative for back pain and neck stiffness.  Neurological: Positive for dizziness, light-headedness and headaches. Negative for syncope, speech difficulty, weakness and numbness.    Patient Active Problem List   Diagnosis Date Noted  . Routine general medical examination at a health care facility 08/23/2013  . Essential hypertension, benign 08/23/2013  . Impaired fasting glucose 08/23/2013  . Plantar fasciitis,  bilateral 08/23/2013  . Unspecified vitamin D deficiency 08/23/2013  . Obesity, unspecified 08/23/2013  . Unspecified essential hypertension 12/31/2012  . GERD (gastroesophageal reflux disease) 12/31/2012  . COMMON MIGRAINE 06/16/2008  . CHEST PAIN, INTERMITTENT 06/16/2008    Current Outpatient Prescriptions on File Prior to Visit  Medication Sig Dispense Refill  . triamterene-hydrochlorothiazide (MAXZIDE-25) 37.5-25 MG per tablet Take 1 tablet by mouth daily. 90 tablet 3   No current facility-administered medications on file prior to visit.     No Known Allergies   Objective:  BP (!) 159/103 (BP Location: Right Arm, Patient Position: Sitting, Cuff Size: Normal)   Pulse 85   Temp 98.5 F (36.9 C) (Oral)   Resp 18   Ht 5' 4.5" (1.638 m)   Wt 240 lb 3.2 oz (109 kg)   LMP 02/05/2017   SpO2 98%   BMI 40.59 kg/m   Physical Exam  Constitutional: She is oriented to person, place, and time and well-developed, well-nourished, and in no distress. No distress.  HENT:  Temples not pulsatile. Temples not TTP  Eyes: EOM are normal. Pupils are equal, round, and reactive to light.  Cardiovascular: Normal rate, regular rhythm, S1 normal, S2 normal, normal heart sounds and normal pulses.   Pulmonary/Chest: Effort normal and breath sounds normal. She exhibits tenderness (right intercostal muscles).  Musculoskeletal:       Cervical back: She exhibits tenderness (b/l cervical muscles). She exhibits normal range of motion, no bony tenderness and no spasm.  Neurological: She is alert and  oriented to person, place, and time. GCS score is 15.  Skin: Skin is warm and dry.  Psychiatric: Mood, memory, affect and judgment normal.  Vitals reviewed.   Results for orders placed or performed in visit on 02/25/17  POCT CBC  Result Value Ref Range   WBC 6.5 4.6 - 10.2 K/uL   Lymph, poc 2.0 0.6 - 3.4   POC LYMPH PERCENT 30.6 10 - 50 %L   MID (cbc) 0.3 0 - 0.9   POC MID % 4.7 0 - 12 %M   POC  Granulocyte 4.2 2 - 6.9   Granulocyte percent 64.7 37 - 80 %G   RBC 4.53 4.04 - 5.48 M/uL   Hemoglobin 12.7 12.2 - 16.2 g/dL   HCT, POC 36.1 (A) 37.7 - 47.9 %   MCV 79.5 (A) 80 - 97 fL   MCH, POC 28.1 27 - 31.2 pg   MCHC 35.3 31.8 - 35.4 g/dL   RDW, POC 13.6 %   Platelet Count, POC 192 142 - 424 K/uL   MPV 8.4 0 - 99.8 fL  POCT glucose (manual entry)  Result Value Ref Range   POC Glucose 110 (A) 70 - 99 mg/dl   10/2016 [facility] Dr Atilano Median Assessment: 1. Chest discomfort. Probably from GERD, with recent exacerbation from situational stress. Pattern a less typical for ischemic discomfort 2. Hyperlipidemia. Isolated LDL elevation, most recently 142 with normal triglycerides and HDL. Elevated LDL pattern probably related to excessive calories and obesity along with lack of regular aerobic exercise. Lipid panel not consistent with any significant monogenic inherited dyslipidemia  EKG - normal sinus rhythm. Possible right atrial enlargement.  Assessment and Plan :  1. Chest pain, unspecified type 2. Elevated blood pressure reading 3. Acute nonintractable headache, unspecified headache type 4. Neck pain, bilateral - EKG 12-Lead - CMP14+EGFR - Lipid panel - Recheck vitals - POCT CBC - POCT glucose (manual entry) - butalbital-acetaminophen-caffeine (FIORICET WITH CODEINE) 50-325-40-30 MG capsule; Take 1 capsule by mouth every 4 (four) hours as needed for headache.  Dispense: 30 capsule; Refill: 0 - EKG and POCT labs are reassuring. Recent cardiology work-up by Dr Atilano Median is also reassuring. Not concerned with ischemia at this time.  Chest and neck pain are reproducible. Likely acute migraine. Will treat. Medication side effects discussed.  Labs are pending. F/u with PCP. She agrees with plan.   Mercer Pod, PA-C  [facility] [place] 02/25/2017 1:35 PM

## 2017-02-26 LAB — LIPID PANEL
Chol/HDL Ratio: 3.3 ratio (ref 0.0–4.4)
Cholesterol, Total: 180 mg/dL (ref 100–199)
HDL: 55 mg/dL (ref >39)
LDL Calculated: 109 mg/dL — ABNORMAL HIGH (ref 0–99)
Triglycerides: 81 mg/dL (ref 0–149)
VLDL Cholesterol Cal: 16 mg/dL (ref 5–40)

## 2017-02-26 LAB — CMP14+EGFR
ALT: 48 IU/L — ABNORMAL HIGH (ref 0–32)
AST: 42 IU/L — ABNORMAL HIGH (ref 0–40)
Albumin/Globulin Ratio: 1.6 (ref 1.2–2.2)
Albumin: 4.1 g/dL (ref 3.5–5.5)
Alkaline Phosphatase: 92 IU/L (ref 39–117)
BUN/Creatinine Ratio: 7 — ABNORMAL LOW (ref 9–23)
BUN: 9 mg/dL (ref 6–24)
Bilirubin Total: 0.2 mg/dL (ref 0.0–1.2)
CO2: 28 mmol/L (ref 20–29)
Calcium: 9.7 mg/dL (ref 8.7–10.2)
Chloride: 100 mmol/L (ref 96–106)
Creatinine, Ser: 1.32 mg/dL — ABNORMAL HIGH (ref 0.57–1.00)
GFR calc Af Amer: 55 mL/min/{1.73_m2} — ABNORMAL LOW (ref >59)
GFR calc non Af Amer: 48 mL/min/{1.73_m2} — ABNORMAL LOW (ref >59)
Globulin, Total: 2.6 g/dL (ref 1.5–4.5)
Glucose: 97 mg/dL (ref 65–99)
Potassium: 4.1 mmol/L (ref 3.5–5.2)
Sodium: 143 mmol/L (ref 134–144)
Total Protein: 6.7 g/dL (ref 6.0–8.5)

## 2017-03-03 ENCOUNTER — Inpatient Hospital Stay (HOSPITAL_COMMUNITY): Payer: 59 | Admitting: Anesthesiology

## 2017-03-03 ENCOUNTER — Inpatient Hospital Stay (HOSPITAL_COMMUNITY): Payer: 59

## 2017-03-03 ENCOUNTER — Inpatient Hospital Stay (HOSPITAL_COMMUNITY)
Admission: EM | Admit: 2017-03-03 | Discharge: 2017-04-09 | DRG: 020 | Disposition: A | Discharge status: Rehabilitation Facility | Payer: 59 | Attending: Neurosurgery | Admitting: Neurosurgery

## 2017-03-03 ENCOUNTER — Emergency Department (HOSPITAL_COMMUNITY): Payer: 59

## 2017-03-03 ENCOUNTER — Encounter (HOSPITAL_COMMUNITY)
Admission: EM | Disposition: A | Discharge status: Rehabilitation Facility | Payer: Self-pay | Source: Home / Self Care | Attending: Neurosurgery

## 2017-03-03 ENCOUNTER — Encounter (HOSPITAL_COMMUNITY): Payer: Self-pay | Admitting: *Deleted

## 2017-03-03 DIAGNOSIS — J9602 Acute respiratory failure with hypercapnia: Secondary | ICD-10-CM | POA: Diagnosis not present

## 2017-03-03 DIAGNOSIS — Z01818 Encounter for other preprocedural examination: Secondary | ICD-10-CM

## 2017-03-03 DIAGNOSIS — R509 Fever, unspecified: Secondary | ICD-10-CM | POA: Diagnosis not present

## 2017-03-03 DIAGNOSIS — J9600 Acute respiratory failure, unspecified whether with hypoxia or hypercapnia: Secondary | ICD-10-CM | POA: Diagnosis not present

## 2017-03-03 DIAGNOSIS — R4189 Other symptoms and signs involving cognitive functions and awareness: Secondary | ICD-10-CM

## 2017-03-03 DIAGNOSIS — J154 Pneumonia due to other streptococci: Secondary | ICD-10-CM | POA: Diagnosis not present

## 2017-03-03 DIAGNOSIS — R000 Tachycardia, unspecified: Secondary | ICD-10-CM | POA: Diagnosis not present

## 2017-03-03 DIAGNOSIS — R404 Transient alteration of awareness: Secondary | ICD-10-CM | POA: Diagnosis not present

## 2017-03-03 DIAGNOSIS — R402112 Coma scale, eyes open, never, at arrival to emergency department: Secondary | ICD-10-CM | POA: Diagnosis present

## 2017-03-03 DIAGNOSIS — R0902 Hypoxemia: Secondary | ICD-10-CM

## 2017-03-03 DIAGNOSIS — Z931 Gastrostomy status: Secondary | ICD-10-CM

## 2017-03-03 DIAGNOSIS — I720 Aneurysm of carotid artery: Secondary | ICD-10-CM | POA: Diagnosis not present

## 2017-03-03 DIAGNOSIS — Z7189 Other specified counseling: Secondary | ICD-10-CM | POA: Diagnosis not present

## 2017-03-03 DIAGNOSIS — I615 Nontraumatic intracerebral hemorrhage, intraventricular: Secondary | ICD-10-CM | POA: Diagnosis present

## 2017-03-03 DIAGNOSIS — R402312 Coma scale, best motor response, none, at arrival to emergency department: Secondary | ICD-10-CM | POA: Diagnosis present

## 2017-03-03 DIAGNOSIS — E872 Acidosis: Secondary | ICD-10-CM | POA: Diagnosis not present

## 2017-03-03 DIAGNOSIS — G9340 Encephalopathy, unspecified: Secondary | ICD-10-CM | POA: Diagnosis not present

## 2017-03-03 DIAGNOSIS — R740 Nonspecific elevation of levels of transaminase and lactic acid dehydrogenase [LDH]: Secondary | ICD-10-CM | POA: Diagnosis not present

## 2017-03-03 DIAGNOSIS — R6521 Severe sepsis with septic shock: Secondary | ICD-10-CM | POA: Diagnosis not present

## 2017-03-03 DIAGNOSIS — J189 Pneumonia, unspecified organism: Secondary | ICD-10-CM | POA: Diagnosis not present

## 2017-03-03 DIAGNOSIS — D62 Acute posthemorrhagic anemia: Secondary | ICD-10-CM | POA: Diagnosis not present

## 2017-03-03 DIAGNOSIS — Z431 Encounter for attention to gastrostomy: Secondary | ICD-10-CM | POA: Diagnosis not present

## 2017-03-03 DIAGNOSIS — G4089 Other seizures: Secondary | ICD-10-CM | POA: Diagnosis present

## 2017-03-03 DIAGNOSIS — Y95 Nosocomial condition: Secondary | ICD-10-CM | POA: Diagnosis not present

## 2017-03-03 DIAGNOSIS — Z9889 Other specified postprocedural states: Secondary | ICD-10-CM

## 2017-03-03 DIAGNOSIS — J155 Pneumonia due to Escherichia coli: Secondary | ICD-10-CM | POA: Diagnosis not present

## 2017-03-03 DIAGNOSIS — J9690 Respiratory failure, unspecified, unspecified whether with hypoxia or hypercapnia: Secondary | ICD-10-CM

## 2017-03-03 DIAGNOSIS — I619 Nontraumatic intracerebral hemorrhage, unspecified: Secondary | ICD-10-CM | POA: Diagnosis not present

## 2017-03-03 DIAGNOSIS — I161 Hypertensive emergency: Secondary | ICD-10-CM

## 2017-03-03 DIAGNOSIS — G919 Hydrocephalus, unspecified: Secondary | ICD-10-CM | POA: Diagnosis present

## 2017-03-03 DIAGNOSIS — A419 Sepsis, unspecified organism: Secondary | ICD-10-CM | POA: Diagnosis not present

## 2017-03-03 DIAGNOSIS — Z978 Presence of other specified devices: Secondary | ICD-10-CM

## 2017-03-03 DIAGNOSIS — Z515 Encounter for palliative care: Secondary | ICD-10-CM | POA: Diagnosis not present

## 2017-03-03 DIAGNOSIS — R0602 Shortness of breath: Secondary | ICD-10-CM

## 2017-03-03 DIAGNOSIS — R402212 Coma scale, best verbal response, none, at arrival to emergency department: Secondary | ICD-10-CM | POA: Diagnosis present

## 2017-03-03 DIAGNOSIS — I10 Essential (primary) hypertension: Secondary | ICD-10-CM | POA: Diagnosis present

## 2017-03-03 DIAGNOSIS — K219 Gastro-esophageal reflux disease without esophagitis: Secondary | ICD-10-CM | POA: Diagnosis present

## 2017-03-03 DIAGNOSIS — J80 Acute respiratory distress syndrome: Secondary | ICD-10-CM | POA: Diagnosis not present

## 2017-03-03 DIAGNOSIS — R7303 Prediabetes: Secondary | ICD-10-CM | POA: Diagnosis not present

## 2017-03-03 DIAGNOSIS — R4689 Other symptoms and signs involving appearance and behavior: Secondary | ICD-10-CM

## 2017-03-03 DIAGNOSIS — Z66 Do not resuscitate: Secondary | ICD-10-CM | POA: Diagnosis not present

## 2017-03-03 DIAGNOSIS — R6510 Systemic inflammatory response syndrome (SIRS) of non-infectious origin without acute organ dysfunction: Secondary | ICD-10-CM | POA: Diagnosis not present

## 2017-03-03 DIAGNOSIS — R0989 Other specified symptoms and signs involving the circulatory and respiratory systems: Secondary | ICD-10-CM | POA: Diagnosis not present

## 2017-03-03 DIAGNOSIS — R29710 NIHSS score 10: Secondary | ICD-10-CM | POA: Diagnosis present

## 2017-03-03 DIAGNOSIS — G43909 Migraine, unspecified, not intractable, without status migrainosus: Secondary | ICD-10-CM | POA: Diagnosis present

## 2017-03-03 DIAGNOSIS — D6489 Other specified anemias: Secondary | ICD-10-CM | POA: Diagnosis present

## 2017-03-03 DIAGNOSIS — Z9289 Personal history of other medical treatment: Secondary | ICD-10-CM

## 2017-03-03 DIAGNOSIS — R638 Other symptoms and signs concerning food and fluid intake: Secondary | ICD-10-CM | POA: Diagnosis not present

## 2017-03-03 DIAGNOSIS — Z8249 Family history of ischemic heart disease and other diseases of the circulatory system: Secondary | ICD-10-CM | POA: Diagnosis not present

## 2017-03-03 DIAGNOSIS — Z9911 Dependence on respirator [ventilator] status: Secondary | ICD-10-CM

## 2017-03-03 DIAGNOSIS — J9601 Acute respiratory failure with hypoxia: Secondary | ICD-10-CM

## 2017-03-03 DIAGNOSIS — R0682 Tachypnea, not elsewhere classified: Secondary | ICD-10-CM

## 2017-03-03 DIAGNOSIS — N179 Acute kidney failure, unspecified: Secondary | ICD-10-CM | POA: Diagnosis not present

## 2017-03-03 DIAGNOSIS — Z6841 Body Mass Index (BMI) 40.0 and over, adult: Secondary | ICD-10-CM

## 2017-03-03 DIAGNOSIS — Z0000 Encounter for general adult medical examination without abnormal findings: Secondary | ICD-10-CM

## 2017-03-03 DIAGNOSIS — R339 Retention of urine, unspecified: Secondary | ICD-10-CM | POA: Diagnosis not present

## 2017-03-03 DIAGNOSIS — R269 Unspecified abnormalities of gait and mobility: Secondary | ICD-10-CM | POA: Diagnosis not present

## 2017-03-03 DIAGNOSIS — E876 Hypokalemia: Secondary | ICD-10-CM | POA: Diagnosis not present

## 2017-03-03 DIAGNOSIS — I609 Nontraumatic subarachnoid hemorrhage, unspecified: Secondary | ICD-10-CM

## 2017-03-03 DIAGNOSIS — K567 Ileus, unspecified: Secondary | ICD-10-CM

## 2017-03-03 DIAGNOSIS — Z833 Family history of diabetes mellitus: Secondary | ICD-10-CM

## 2017-03-03 DIAGNOSIS — E878 Other disorders of electrolyte and fluid balance, not elsewhere classified: Secondary | ICD-10-CM | POA: Diagnosis not present

## 2017-03-03 DIAGNOSIS — J15211 Pneumonia due to Methicillin susceptible Staphylococcus aureus: Secondary | ICD-10-CM | POA: Diagnosis not present

## 2017-03-03 DIAGNOSIS — I607 Nontraumatic subarachnoid hemorrhage from unspecified intracranial artery: Secondary | ICD-10-CM | POA: Diagnosis present

## 2017-03-03 DIAGNOSIS — R739 Hyperglycemia, unspecified: Secondary | ICD-10-CM | POA: Diagnosis present

## 2017-03-03 DIAGNOSIS — E870 Hyperosmolality and hypernatremia: Secondary | ICD-10-CM | POA: Diagnosis not present

## 2017-03-03 DIAGNOSIS — I360 Nonrheumatic tricuspid (valve) stenosis: Secondary | ICD-10-CM | POA: Diagnosis not present

## 2017-03-03 DIAGNOSIS — Z800 Family history of malignant neoplasm of digestive organs: Secondary | ICD-10-CM

## 2017-03-03 DIAGNOSIS — R1310 Dysphagia, unspecified: Secondary | ICD-10-CM | POA: Diagnosis not present

## 2017-03-03 DIAGNOSIS — J988 Other specified respiratory disorders: Secondary | ICD-10-CM | POA: Diagnosis not present

## 2017-03-03 DIAGNOSIS — F4323 Adjustment disorder with mixed anxiety and depressed mood: Secondary | ICD-10-CM | POA: Diagnosis not present

## 2017-03-03 DIAGNOSIS — E669 Obesity, unspecified: Secondary | ICD-10-CM | POA: Diagnosis present

## 2017-03-03 DIAGNOSIS — Z8349 Family history of other endocrine, nutritional and metabolic diseases: Secondary | ICD-10-CM

## 2017-03-03 DIAGNOSIS — R29722 NIHSS score 22: Secondary | ICD-10-CM | POA: Diagnosis not present

## 2017-03-03 DIAGNOSIS — I69398 Other sequelae of cerebral infarction: Secondary | ICD-10-CM | POA: Diagnosis not present

## 2017-03-03 DIAGNOSIS — I69319 Unspecified symptoms and signs involving cognitive functions following cerebral infarction: Secondary | ICD-10-CM | POA: Diagnosis not present

## 2017-03-03 HISTORY — PX: IR TRANSCATH/EMBOLIZ: IMG695

## 2017-03-03 HISTORY — PX: IR ANGIOGRAM FOLLOW UP STUDY: IMG697

## 2017-03-03 HISTORY — PX: IR ANGIO VERTEBRAL SEL VERTEBRAL BILAT MOD SED: IMG5369

## 2017-03-03 HISTORY — PX: RADIOLOGY WITH ANESTHESIA: SHX6223

## 2017-03-03 HISTORY — PX: IR ANGIO INTRA EXTRACRAN SEL INTERNAL CAROTID UNI R MOD SED: IMG5362

## 2017-03-03 LAB — COMPREHENSIVE METABOLIC PANEL
ALT: 44 U/L (ref 14–54)
ANION GAP: 10 (ref 5–15)
AST: 45 U/L — ABNORMAL HIGH (ref 15–41)
Albumin: 3.9 g/dL (ref 3.5–5.0)
Alkaline Phosphatase: 77 U/L (ref 38–126)
BUN: 7 mg/dL (ref 6–20)
CHLORIDE: 104 mmol/L (ref 101–111)
CO2: 23 mmol/L (ref 22–32)
Calcium: 8.8 mg/dL — ABNORMAL LOW (ref 8.9–10.3)
Creatinine, Ser: 1.26 mg/dL — ABNORMAL HIGH (ref 0.44–1.00)
GFR calc non Af Amer: 50 mL/min — ABNORMAL LOW (ref >60)
GFR, EST AFRICAN AMERICAN: 58 mL/min — AB (ref >60)
Glucose, Bld: 147 mg/dL — ABNORMAL HIGH (ref 65–99)
Potassium: 3.3 mmol/L — ABNORMAL LOW (ref 3.5–5.1)
SODIUM: 137 mmol/L (ref 135–145)
Total Bilirubin: 0.7 mg/dL (ref 0.3–1.2)
Total Protein: 6.6 g/dL (ref 6.5–8.1)

## 2017-03-03 LAB — CBC
HEMATOCRIT: 33.5 % — AB (ref 36.0–46.0)
HEMOGLOBIN: 10.9 g/dL — AB (ref 12.0–15.0)
MCH: 27 pg (ref 26.0–34.0)
MCHC: 32.5 g/dL (ref 30.0–36.0)
MCV: 82.9 fL (ref 78.0–100.0)
Platelets: 192 10*3/uL (ref 150–400)
RBC: 4.04 MIL/uL (ref 3.87–5.11)
RDW: 14.3 % (ref 11.5–15.5)
WBC: 9.9 10*3/uL (ref 4.0–10.5)

## 2017-03-03 LAB — I-STAT ARTERIAL BLOOD GAS, ED
Acid-base deficit: 3 mmol/L — ABNORMAL HIGH (ref 0.0–2.0)
BICARBONATE: 25.2 mmol/L (ref 20.0–28.0)
O2 SAT: 99 %
PCO2 ART: 55.7 mmHg — AB (ref 32.0–48.0)
PH ART: 7.265 — AB (ref 7.350–7.450)
PO2 ART: 150 mmHg — AB (ref 83.0–108.0)
TCO2: 27 mmol/L (ref 0–100)

## 2017-03-03 LAB — GLUCOSE, CAPILLARY
Glucose-Capillary: 69 mg/dL (ref 65–99)
Glucose-Capillary: 86 mg/dL (ref 65–99)
Glucose-Capillary: 70 mg/dL (ref 65–99)

## 2017-03-03 LAB — APTT: aPTT: 31 seconds (ref 24–36)

## 2017-03-03 LAB — MRSA PCR SCREENING: MRSA by PCR: NEGATIVE

## 2017-03-03 LAB — PROTIME-INR
INR: 1.07
Prothrombin Time: 13.9 s (ref 11.4–15.2)

## 2017-03-03 SURGERY — RADIOLOGY WITH ANESTHESIA
Anesthesia: General

## 2017-03-03 MED ORDER — PROPOFOL 500 MG/50ML IV EMUL
INTRAVENOUS | Status: DC | PRN
  Administered 2017-03-03: 50 ug/kg/min via INTRAVENOUS

## 2017-03-03 MED ORDER — LORAZEPAM 2 MG/ML IJ SOLN
INTRAMUSCULAR | Status: AC
  Filled 2017-03-03: qty 1

## 2017-03-03 MED ORDER — NICARDIPINE HCL IN NACL 20-0.86 MG/200ML-% IV SOLN: 0.0000 mg/h | INTRAVENOUS | Status: DC

## 2017-03-03 MED ORDER — PHENYLEPHRINE HCL 10 MG/ML IJ SOLN
0.0000 ug/min | INTRAMUSCULAR | Status: DC
  Administered 2017-03-04: 10 ug/min via INTRAVENOUS
  Administered 2017-03-06: 20 ug/min via INTRAVENOUS
  Administered 2017-03-06: 80 ug/min via INTRAVENOUS
  Administered 2017-03-06: 50 ug/min via INTRAVENOUS
  Administered 2017-03-06: 40 ug/min via INTRAVENOUS
  Filled 2017-03-03 (×7): qty 1

## 2017-03-03 MED ORDER — FENTANYL CITRATE (PF) 100 MCG/2ML IJ SOLN
100.0000 ug | INTRAMUSCULAR | Status: AC | PRN
  Administered 2017-03-05 – 2017-03-06 (×3): 100 ug via INTRAVENOUS
  Filled 2017-03-03 (×3): qty 2

## 2017-03-03 MED ORDER — DOCUSATE SODIUM 100 MG PO CAPS: 100.0000 mg | ORAL_CAPSULE | Freq: Two times a day (BID) | ORAL | Status: DC

## 2017-03-03 MED ORDER — MIDAZOLAM HCL 2 MG/2ML IJ SOLN
2.0000 mg | INTRAMUSCULAR | Status: DC | PRN
  Administered 2017-03-03 – 2017-03-07 (×9): 2 mg via INTRAVENOUS
  Filled 2017-03-03 (×8): qty 2

## 2017-03-03 MED ORDER — DEXTROSE 50 % IV SOLN
INTRAVENOUS | Status: AC
  Administered 2017-03-03: 50 mL
  Filled 2017-03-03: qty 50

## 2017-03-03 MED ORDER — ONDANSETRON 4 MG PO TBDP: 4.0000 mg | ORAL_TABLET | Freq: Four times a day (QID) | ORAL | Status: DC | PRN

## 2017-03-03 MED ORDER — PROPOFOL 1000 MG/100ML IV EMUL
INTRAVENOUS | Status: AC
  Administered 2017-03-03: 30 mg/kg/h
  Filled 2017-03-03: qty 100

## 2017-03-03 MED ORDER — HYDROMORPHONE HCL 1 MG/ML IJ SOLN: 0.5000 mg | INTRAMUSCULAR | Status: DC | PRN

## 2017-03-03 MED ORDER — ORAL CARE MOUTH RINSE
15.0000 mL | OROMUCOSAL | Status: DC
  Administered 2017-03-04 – 2017-03-27 (×251): 15 mL via OROMUCOSAL

## 2017-03-03 MED ORDER — FENTANYL CITRATE (PF) 100 MCG/2ML IJ SOLN
50.0000 ug | Freq: Once | INTRAMUSCULAR | Status: AC
  Administered 2017-03-03: 50 ug via INTRAVENOUS

## 2017-03-03 MED ORDER — NIMODIPINE 60 MG/20ML PO SOLN
60.0000 mg | ORAL | Status: DC
  Administered 2017-03-03 – 2017-03-13 (×57): 60 mg
  Filled 2017-03-03 (×55): qty 20

## 2017-03-03 MED ORDER — CHLORHEXIDINE GLUCONATE 0.12% ORAL RINSE (MEDLINE KIT)
15.0000 mL | Freq: Two times a day (BID) | OROMUCOSAL | Status: DC
  Administered 2017-03-03 – 2017-04-04 (×61): 15 mL via OROMUCOSAL

## 2017-03-03 MED ORDER — MIDAZOLAM HCL 2 MG/2ML IJ SOLN
INTRAMUSCULAR | Status: AC
  Administered 2017-03-03: 2 mg
  Filled 2017-03-03: qty 2

## 2017-03-03 MED ORDER — ACETAMINOPHEN 325 MG PO TABS: 650.0000 mg | ORAL_TABLET | ORAL | Status: DC | PRN

## 2017-03-03 MED ORDER — FENTANYL BOLUS VIA INFUSION
50.0000 ug | INTRAVENOUS | Status: DC | PRN
  Administered 2017-03-04: 50 ug via INTRAVENOUS
  Filled 2017-03-03: qty 50

## 2017-03-03 MED ORDER — IOPAMIDOL (ISOVUE-300) INJECTION 61%
INTRAVENOUS | Status: AC
  Administered 2017-03-03: 65 mL
  Filled 2017-03-03: qty 300

## 2017-03-03 MED ORDER — PANTOPRAZOLE SODIUM 40 MG PO PACK: 40.0000 mg | PACK | Freq: Every day | ORAL | Status: DC

## 2017-03-03 MED ORDER — SODIUM CHLORIDE 0.9 % IV BOLUS (SEPSIS)
500.0000 mL | Freq: Once | INTRAVENOUS | Status: AC
  Administered 2017-03-03: 500 mL via INTRAVENOUS

## 2017-03-03 MED ORDER — SUCCINYLCHOLINE CHLORIDE 20 MG/ML IJ SOLN
INTRAMUSCULAR | Status: AC | PRN
  Administered 2017-03-03: 150 mg via INTRAVENOUS

## 2017-03-03 MED ORDER — SODIUM CHLORIDE 0.9 % IV SOLN
INTRAVENOUS | Status: DC
Start: 2017-03-03 — End: 2017-03-04
  Administered 2017-03-03 (×4): via INTRAVENOUS

## 2017-03-03 MED ORDER — ONDANSETRON HCL 4 MG/2ML IJ SOLN
4.0000 mg | Freq: Four times a day (QID) | INTRAMUSCULAR | Status: DC | PRN
  Administered 2017-03-03: 4 mg via INTRAVENOUS
  Filled 2017-03-03: qty 2

## 2017-03-03 MED ORDER — MANNITOL 25 % IV SOLN
50.0000 g | Freq: Once | INTRAVENOUS | Status: AC
  Administered 2017-03-03: 50 g via INTRAVENOUS
  Filled 2017-03-03: qty 200

## 2017-03-03 MED ORDER — NIMODIPINE 30 MG PO CAPS
60.0000 mg | ORAL_CAPSULE | ORAL | Status: DC
  Filled 2017-03-03: qty 2

## 2017-03-03 MED ORDER — PHENYLEPHRINE HCL 10 MG/ML IJ SOLN
INTRAVENOUS | Status: DC | PRN
  Administered 2017-03-03: 75 ug/min via INTRAVENOUS

## 2017-03-03 MED ORDER — FENTANYL CITRATE (PF) 250 MCG/5ML IJ SOLN
INTRAMUSCULAR | Status: DC | PRN
  Administered 2017-03-03: 100 ug via INTRAVENOUS

## 2017-03-03 MED ORDER — FENTANYL CITRATE (PF) 100 MCG/2ML IJ SOLN
100.0000 ug | INTRAMUSCULAR | Status: DC | PRN
  Administered 2017-03-04: 100 ug via INTRAVENOUS
  Filled 2017-03-03: qty 2

## 2017-03-03 MED ORDER — ACETAMINOPHEN-CODEINE #3 300-30 MG PO TABS: 1.0000 | ORAL_TABLET | ORAL | Status: DC | PRN

## 2017-03-03 MED ORDER — FENTANYL CITRATE (PF) 100 MCG/2ML IJ SOLN
INTRAMUSCULAR | Status: AC
  Administered 2017-03-03: 50 ug via INTRAVENOUS
  Filled 2017-03-03: qty 2

## 2017-03-03 MED ORDER — PANTOPRAZOLE SODIUM 40 MG PO TBEC: 40.0000 mg | DELAYED_RELEASE_TABLET | Freq: Every day | ORAL | Status: DC

## 2017-03-03 MED ORDER — ORAL CARE MOUTH RINSE: 15.0000 mL | Freq: Four times a day (QID) | OROMUCOSAL | Status: DC

## 2017-03-03 MED ORDER — MIDAZOLAM HCL 2 MG/2ML IJ SOLN
2.0000 mg | INTRAMUSCULAR | Status: AC | PRN
  Administered 2017-03-03 – 2017-03-07 (×3): 2 mg via INTRAVENOUS
  Filled 2017-03-03 (×4): qty 2

## 2017-03-03 MED ORDER — STROKE: EARLY STAGES OF RECOVERY BOOK
Freq: Once | Status: DC
  Filled 2017-03-03: qty 1

## 2017-03-03 MED ORDER — FENTANYL 2500MCG IN NS 250ML (10MCG/ML) PREMIX INFUSION
25.0000 ug/h | INTRAVENOUS | Status: DC
  Administered 2017-03-03: 25 ug/h via INTRAVENOUS
  Administered 2017-03-03: 50 ug/h via INTRAVENOUS
  Filled 2017-03-03 (×2): qty 250

## 2017-03-03 MED ORDER — ROCURONIUM BROMIDE 10 MG/ML (PF) SYRINGE
PREFILLED_SYRINGE | INTRAVENOUS | Status: DC | PRN
  Administered 2017-03-03: 20 mg via INTRAVENOUS
  Administered 2017-03-03: 50 mg via INTRAVENOUS
  Administered 2017-03-03: 30 mg via INTRAVENOUS

## 2017-03-03 MED ORDER — IOPAMIDOL (ISOVUE-370) INJECTION 76%
INTRAVENOUS | Status: AC
  Administered 2017-03-03: 50 mL
  Filled 2017-03-03: qty 50

## 2017-03-03 MED ORDER — NICARDIPINE HCL IN NACL 20-0.86 MG/200ML-% IV SOLN
3.0000 mg/h | Freq: Once | INTRAVENOUS | Status: AC
  Administered 2017-03-03: 5 mg/h via INTRAVENOUS

## 2017-03-03 MED ORDER — SODIUM CHLORIDE 0.9 % IV SOLN
1000.0000 mg | Freq: Once | INTRAVENOUS | Status: AC
  Administered 2017-03-03: 1000 mg via INTRAVENOUS
  Filled 2017-03-03: qty 10

## 2017-03-03 MED ORDER — IOPAMIDOL (ISOVUE-300) INJECTION 61%
INTRAVENOUS | Status: AC
  Filled 2017-03-03: qty 100

## 2017-03-03 MED ORDER — ACETAMINOPHEN 650 MG RE SUPP
650.0000 mg | RECTAL | Status: DC | PRN
  Administered 2017-03-20 (×2): 650 mg via RECTAL
  Filled 2017-03-03 (×2): qty 1

## 2017-03-03 MED ORDER — INSULIN ASPART 100 UNIT/ML ~~LOC~~ SOLN
0.0000 [IU] | SUBCUTANEOUS | Status: DC
  Administered 2017-03-04 – 2017-03-07 (×5): 1 [IU] via SUBCUTANEOUS
  Administered 2017-03-07: 2 [IU] via SUBCUTANEOUS
  Administered 2017-03-07 (×3): 1 [IU] via SUBCUTANEOUS
  Administered 2017-03-08: 3 [IU] via SUBCUTANEOUS
  Administered 2017-03-08 (×2): 1 [IU] via SUBCUTANEOUS
  Administered 2017-03-08 – 2017-03-09 (×4): 2 [IU] via SUBCUTANEOUS
  Administered 2017-03-09 (×2): 3 [IU] via SUBCUTANEOUS
  Administered 2017-03-09: 2 [IU] via SUBCUTANEOUS
  Administered 2017-03-10 (×3): 3 [IU] via SUBCUTANEOUS
  Administered 2017-03-10: 2 [IU] via SUBCUTANEOUS
  Administered 2017-03-10: 3 [IU] via SUBCUTANEOUS
  Administered 2017-03-10 – 2017-03-11 (×3): 2 [IU] via SUBCUTANEOUS
  Administered 2017-03-11 (×2): 1 [IU] via SUBCUTANEOUS
  Administered 2017-03-11: 5 [IU] via SUBCUTANEOUS
  Administered 2017-03-12 (×2): 2 [IU] via SUBCUTANEOUS
  Administered 2017-03-12 (×3): 1 [IU] via SUBCUTANEOUS
  Administered 2017-03-13: 2 [IU] via SUBCUTANEOUS
  Administered 2017-03-13 (×3): 1 [IU] via SUBCUTANEOUS
  Administered 2017-03-13 – 2017-03-14 (×2): 2 [IU] via SUBCUTANEOUS
  Administered 2017-03-14: 1 [IU] via SUBCUTANEOUS
  Administered 2017-03-14 (×3): 2 [IU] via SUBCUTANEOUS
  Administered 2017-03-14: 1 [IU] via SUBCUTANEOUS
  Administered 2017-03-15: 2 [IU] via SUBCUTANEOUS
  Administered 2017-03-15 – 2017-03-16 (×6): 1 [IU] via SUBCUTANEOUS
  Administered 2017-03-16 (×4): 2 [IU] via SUBCUTANEOUS
  Administered 2017-03-16: 1 [IU] via SUBCUTANEOUS
  Administered 2017-03-17: 2 [IU] via SUBCUTANEOUS
  Administered 2017-03-17: 1 [IU] via SUBCUTANEOUS
  Administered 2017-03-17 (×3): 2 [IU] via SUBCUTANEOUS
  Administered 2017-03-17: 1 [IU] via SUBCUTANEOUS
  Administered 2017-03-18 (×2): 2 [IU] via SUBCUTANEOUS
  Administered 2017-03-18: 1 [IU] via SUBCUTANEOUS
  Administered 2017-03-18 (×2): 2 [IU] via SUBCUTANEOUS
  Administered 2017-03-19: 1 [IU] via SUBCUTANEOUS
  Administered 2017-03-19 (×2): 2 [IU] via SUBCUTANEOUS
  Administered 2017-03-19 (×2): 1 [IU] via SUBCUTANEOUS
  Administered 2017-03-19 – 2017-03-20 (×2): 2 [IU] via SUBCUTANEOUS
  Administered 2017-03-20 – 2017-03-21 (×2): 1 [IU] via SUBCUTANEOUS
  Administered 2017-03-21: 2 [IU] via SUBCUTANEOUS
  Administered 2017-03-21 – 2017-03-24 (×10): 1 [IU] via SUBCUTANEOUS

## 2017-03-03 MED ORDER — ACETAMINOPHEN 160 MG/5ML PO SOLN
650.0000 mg | ORAL | Status: DC | PRN
  Administered 2017-03-07 – 2017-03-12 (×4): 650 mg
  Administered 2017-03-12: 17:00:00
  Administered 2017-03-13 – 2017-03-25 (×24): 650 mg
  Filled 2017-03-03 (×31): qty 20.3

## 2017-03-03 MED ORDER — PANTOPRAZOLE SODIUM 40 MG IV SOLR
40.0000 mg | Freq: Every day | INTRAVENOUS | Status: DC
  Administered 2017-03-04 – 2017-03-08 (×5): 40 mg via INTRAVENOUS
  Filled 2017-03-03 (×5): qty 40

## 2017-03-03 NOTE — Progress Notes (Signed)
   03/03/17 1300  Clinical Encounter Type  Visited With Patient and family together;Health care provider  Visit Type Initial;Spiritual support  Referral From Nurse  Consult/Referral To Chaplain  Spiritual Encounters  Spiritual Needs Emotional  Stress Factors  Patient Stress Factors None identified  Family Stress Factors Family relationships;Health changes;Lack of knowledge    Chaplain paged for intubation in E43. Escorted brother to consult B. Patient presented with migraine symptoms and now seizing and posturing. Provided hospitality, ministry of presence, emotional support and prayer. Tasean Mancha L. Volanda Napoleon, MDiv

## 2017-03-03 NOTE — ED Notes (Signed)
Lab contacted saying CBC hemolyzed.

## 2017-03-03 NOTE — Consult Note (Signed)
CC:  Chief Complaint  Patient presents with  . Altered Mental Status    HPI: Carly [name] is a 46 y.o. female who presented to ER for headache. Patient is currently intubated. History obtained via chart review and by talking with ER personnel. Came from work due to shaking activity along with headache. Initially, she was talkative, alert, normal GCS. She decompensated within 15 min of being in the ER requiring intubation. Possible seizure like activity witnessed by nursing.   PMH: Past Medical History:  Diagnosis Date  . GERD (gastroesophageal reflux disease)   . Hypertension   . Migraines     PSH: Past Surgical History:  Procedure Laterality Date  . CESAREAN SECTION      SH: Social History  Substance Use Topics  . Smoking status: Never Smoker  . Smokeless tobacco: Never Used  . Alcohol use Yes     Comment: occ    MEDS: Prior to Admission medications   Medication Sig Start Date End Date Taking? Authorizing Provider  butalbital-acetaminophen-caffeine (FIORICET WITH CODEINE) [id number] MG capsule Take 1 capsule by mouth every 4 (four) hours as needed for headache. 02/25/17  Yes McVey, Gelene Mink, PA-C  cyclobenzaprine (FLEXERIL) 10 MG tablet Take 10 mg by mouth at bedtime as needed for muscle spasms.   Yes [provider]  Ibuprofen (MIDOL) 200 MG CAPS Take 1-2 capsules by mouth daily as needed.   Yes [provider]  triamterene-hydrochlorothiazide (MAXZIDE-25) 37.5-25 MG per tablet Take 1 tablet by mouth daily. 07/23/13 03/03/17 Yes Zanard, Bernadene Bell, MD    ALLERGY: No Known Allergies  ROS: unable to obtain ROS  Vitals:   03/03/17 1345 03/03/17 1400  BP: 102/60 (!) 121/48  Pulse: 62 (!) 49  Resp: 18 (!) 22  Temp:     Intubated Pupils fixed.  No response to light. No corneal reflexes Intermittent posturing Does not respond to pain  IMAGING: CT HEAD/ANGIO HEAD/NECK IMPRESSION: 1. 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 2. 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 3. Critical Value/emergent results were called by telephone at the time of interpretation on 03/03/2017 at 1242 hours to RN Laveda Norman in the ED who verbally acknowledged and relayed these to Dr. Quintella Reichert , who was involved in patient intubation. I also then briefly discussed the case by telephone with Dr. Juliet Rude. 4. 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 5. Mild ventriculomegaly/transependymal edema. Absence subarachnoid hemorrhage at this time, and basilar cisterns remain patent. 6. Tortuous carotid arteries. No atherosclerosis or stenosis. No other intracranial aneurysm identified.  IMPRESSION/PLAN - 46 y.o. female who presented to ER in normal state of health with headache and decompensated while present in ER. CT showed ruptured intracranial aneurysm with hemorrhage. Neuro exam is poor. Required emergent EVD placement by Dr Marland Kitchen Ditty while in ER. Discussed with family who understands severity of the situation. Pt will need to undergo diagnostic angiogram possible aneurysm coiling vs possible craniotomy for clipping of aneurysm by Dr Kathyrn Sheriff. I have discussed the risks and benefits, including alternatives of the procedures listed above with the family, who states in their own language understanding. They wish to proceed. All questions were sought and answered.

## 2017-03-03 NOTE — ED Notes (Signed)
Pt is posturing and vomiting. Pt turned on her side until seizure meds given then will insert OG tube.

## 2017-03-03 NOTE — Anesthesia Procedure Notes (Signed)
Arterial Line Insertion Start/End6/18/2018 3:45 PM, 03/03/2017 3:50 AM Performed by: Linna Caprice, Soraya Paquette, anesthesiologist  Preanesthetic checklist: patient identified, site marked and monitors and equipment checked Right, radial was placed Catheter size: 20 G Hand hygiene performed , maximum sterile barriers used  and Seldinger technique used Allen's test indicative of satisfactory collateral circulation Attempts: 3 Procedure performed using ultrasound guided technique. Following insertion, Biopatch and dressing applied. Patient tolerated the procedure well with no immediate complications.

## 2017-03-03 NOTE — Progress Notes (Signed)
Fentanyl bag pulled from pyxis - incorrect quantity entered, this was noted with RN after the fact. 1 bag pulled ~75% of bag was wasted  Carly [name] 03/03/2017 4:14 PM

## 2017-03-03 NOTE — Progress Notes (Addendum)
Pts history reviewed. Had sudden onset of HA, ?SZ at work. Had progressive decline in ED requiring intubation. No FH of aneurysms, non-smoker.   EXAM:  BP (!) 89/53   Pulse (!) 53   Temp 98.4 F (36.9 C) (Axillary)   Resp (!) 22   LMP 02/05/2017   SpO2 96%   Intermittent eye opening to pain Pupils 60mm, reactive Breathing over vent Minimal responses to noxious stimuli  IMAGING: CTH reviewed, demonstrates large left frontal IPH with ventricular extension and casting of the left lateral ventricle, 3rd, and 4th ventricles.  CTA demonstrates possible superiorly projecting left paraophthalmic ICA aneurysm. There appears to be active contrast extravasation into the posterior margin of the left frontal hematoma.  IMPRESSION:  46 y.o. 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   PLAN: - Will proceed with diagnostic angiogram, treatment of any identified aneurysm  I have reviewed with the patient's family the situation. Risks, benefits, and alternatives to the angiogram and coiling were reviewed. After all questions were answered, I obtained verbal consent from the patient's husband to proceed as above.

## 2017-03-03 NOTE — Anesthesia Preprocedure Evaluation (Signed)
Anesthesia Evaluation  Patient identified by MRN, date of birth, ID band  Reviewed: Patient's Chart, lab work & pertinent test results, Unable to perform ROS - Chart review only  Airway Mallampati: Intubated       Dental   Pulmonary    breath sounds clear to auscultation       Cardiovascular hypertension,  Rhythm:Regular Rate:Normal     Neuro/Psych    GI/Hepatic   Endo/Other    Renal/GU      Musculoskeletal   Abdominal   Peds  Hematology   Anesthesia Other Findings   Reproductive/Obstetrics                             Anesthesia Physical Anesthesia Plan  ASA: IV and emergent  Anesthesia Plan: General   Post-op Pain Management:    Induction: Intravenous  PONV Risk Score and Plan:   Airway Management Planned: Oral ETT  Additional Equipment: Arterial line  Intra-op Plan:   Post-operative Plan: Post-operative intubation/ventilation  Informed Consent: I have reviewed the patients History and Physical, chart, labs and discussed the procedure including the risks, benefits and alternatives for the proposed anesthesia with the patient or authorized representative who has indicated his/her understanding and acceptance.     Plan Discussed with: CRNA and Anesthesiologist  Anesthesia Plan Comments:         Anesthesia Quick Evaluation

## 2017-03-03 NOTE — Anesthesia Procedure Notes (Signed)
Date/Time: 03/03/2017 3:49 PM Performed by: Myna Bright Pre-anesthesia Checklist: Patient identified, Emergency Drugs available, Suction available and Patient being monitored Patient Re-evaluated:Patient Re-evaluated prior to inductionOxygen Delivery Method: Circle system utilized Intubation Type: Inhalational induction with existing ETT Placement Confirmation: positive ETCO2 and breath sounds checked- equal and bilateral Secured at: 22 cm Tube secured with: Tape

## 2017-03-03 NOTE — Sedation Documentation (Signed)
Drain in place and hooked to accuDrain.

## 2017-03-03 NOTE — Progress Notes (Signed)
Hypoglycemic Event  CBG: 69  Treatment: D50 IV 25 mL  Symptoms: None   Follow-up CBG: Time:2040 CBG Result86   Possible Reasons for Event: Unknown  Comments/MD notified:Vinny C. PA   Lianne Bushy A

## 2017-03-03 NOTE — Transfer of Care (Signed)
Immediate Anesthesia Transfer of Care Note  Patient: Carly [name]  Procedure(s) Performed: Procedure(s): RADIOLOGY WITH ANESTHESIA (N/A)  Patient Location: ICU  Anesthesia Type:General  Level of Consciousness: unresponsive and Patient remains intubated per anesthesia plan  Airway & Oxygen Therapy: Patient remains intubated per anesthesia plan and Patient placed on Ventilator (see vital sign flow sheet for setting)  Post-op Assessment: Report given to RN and Post -op Vital signs reviewed and stable  Post vital signs: Reviewed and stable  Last Vitals:  Vitals:   03/03/17 1430 03/03/17 1500  BP: (!) 89/53 (!) 77/42  Pulse: (!) 53 (!) 52  Resp: (!) 22 (!) 24  Temp:      Last Pain:  Vitals:   03/03/17 1210  TempSrc: Axillary         Complications: No apparent anesthesia complications

## 2017-03-03 NOTE — Brief Op Note (Signed)
PREOP DX:   Subarachnoid hemorrhage  POSTOP DX:   Same  PROCEDURE: 1. Diagnostic cerebral angiogram    2. Coiling of left ICA aneurysm  SURGEON:   Tymon Nemetz  ASSISTANT:   None  ANESTHESIA:  GETA  EBL:    Minimal  SPECIMENS:   None  DRAINS:   None  COMPLICATIONS:  None immediate  CONDITION:   Hemodynamically stable to ICU  FINDINGS:   1. 3.0 x 4.7WL supraclinoid LICA aneurysm successfully coiled with minimal neck residual    2. Diffusely increased mean transit time suggesting elevated ICP, although could also be    seen with decreased cardiac ouput

## 2017-03-03 NOTE — Anesthesia Postprocedure Evaluation (Signed)
Anesthesia Post Note  Patient: Carly [name]  Procedure(s) Performed: Procedure(s) (LRB): RADIOLOGY WITH ANESTHESIA (N/A)     Patient location during evaluation: SICU Anesthesia Type: General Level of consciousness: sedated Pain management: pain level controlled Vital Signs Assessment: post-procedure vital signs reviewed and stable Respiratory status: patient remains intubated per anesthesia plan Cardiovascular status: stable Anesthetic complications: no    Last Vitals:  Vitals:   03/03/17 1430 03/03/17 1500  BP: (!) 89/53 (!) 77/42  Pulse: (!) 53 (!) 52  Resp: (!) 22 (!) 24  Temp:      Last Pain:  Vitals:   03/03/17 1210  TempSrc: Axillary                 Tiajuana Amass

## 2017-03-03 NOTE — Progress Notes (Addendum)
eLink Physician-Brief Progress Note Patient Name: Carly [name] DOB: 1970-12-13 MRN: [id number]   Date of Service  03/03/2017  HPI/Events of Note  Hypotensive coming out of OR CXR clear, WBC and hgb normal Tele OK  eICU Interventions  Start neosynephrine Give another 500cc saline bolus Check 12 lead     Intervention Category Major Interventions: Hypotension - evaluation and management  Simonne Maffucci 03/03/2017, 9:50 PM

## 2017-03-03 NOTE — Procedures (Signed)
After discussing the risks, benefits, and alternatives to the procedure right provided consent to proceed.  The right frontal area was clipped of hair.  A timeout was performed.  The area was prepped and draped in the usual sterile fashion.  The skin was anesthetized with lidocaine with epinephrine.  A 1.5 cm linear incision was made at Kocher's point.  A twist drill burr hole was made.  The bone chips were removed and the incision irrigated.  The dura was incised.  The ventriculostomy catheter was passed to 6.5 cm with return of bloody spinal fluid.  It was tunneled medially and posteriorly.  The catheter was secured at the exit site with a purse string suture.  The skin incision was closed with running nylon suture.  A tension loop was created.  The catheter was connected to the sterile drainage chamber.  A sterile dressing was applied.

## 2017-03-03 NOTE — Progress Notes (Signed)
Spoke with Elmyra Ricks RN in ED to get update on arrival and report. Patient will be going to IR before coming to the floor.

## 2017-03-03 NOTE — Sedation Documentation (Signed)
ET in place 23 at the lip with positive color change.

## 2017-03-03 NOTE — Progress Notes (Deleted)
Per on call PA, drain is okay to be lower and unleveled r/t dried blood clot in drainage system. Drain works properly when it is not leveled.  Will continue to monitor. Lianne Bushy RN BSN

## 2017-03-03 NOTE — Progress Notes (Signed)
eLink Physician-Brief Progress Note Patient Name: ELLAROSE BRANDI DOB: 08/13/1971 MRN: [id number]   Date of Service  03/03/2017  HPI/Events of Note  Hypotension On propofol infusion from IR suite  eICU Interventions  Stop propofol Start saline bolus Change sedation to fentanyl/versed     Intervention Category Major Interventions: Hypotension - evaluation and management  Simonne Maffucci 03/03/2017, 7:46 PM

## 2017-03-03 NOTE — ED Notes (Signed)
Neurosurgery is at bedside to place an external ventricular drain.

## 2017-03-03 NOTE — Procedures (Signed)
Intubation Procedure Note HUNTER PINKARD [id number] Jul 20, 1971  Procedure: Intubation Indications: Airway protection and maintenance  Procedure Details Consent: Risks of procedure as well as the alternatives and risks of each were explained to the (patient/caregiver).  Consent for procedure obtained. Time Out: Verified patient identification, verified procedure, site/side was marked, verified correct patient position, special equipment/implants available, medications/allergies/relevent history reviewed, required imaging and test results available.  Performed  Maximum sterile technique was used including gloves and hand hygiene.  MAC and 3    Evaluation Hemodynamic Status: BP stable throughout; O2 sats: stable throughout Patient's Current Condition: stable Complications: No apparent complications Patient did tolerate procedure well. Chest X-ray ordered to verify placement.  CXR: pending.   Elsie Stain 03/03/2017

## 2017-03-03 NOTE — ED Provider Notes (Signed)
[facility] DEPT Provider Note   CSN: [id number] Arrival date & time: 03/03/17  1201     History   Chief Complaint Chief Complaint  Patient presents with  . Altered Mental Status    HPI Carly [name] is a 46 y.o. female.  HPI Carly [name] is a 46 y.o. female who presents to the Emergency Department complaining of HA.  Level V caveat due to AMS.  Per EMS report she has experienced a headache over the last several days. She developed shaking activity at work and EMS was called. EMS reports a GCS of 15. On patient's ED arrival she was talking to the tech when she became suddenly unresponsive. There is report of focal twitching with eye gaze to the left.   Past Medical History:  Diagnosis Date  . GERD (gastroesophageal reflux disease)   . Hypertension   . Migraines     Patient Active Problem List   Diagnosis Date Noted  . Routine general medical examination at a health care facility 08/23/2013  . Essential hypertension, benign 08/23/2013  . Impaired fasting glucose 08/23/2013  . Plantar fasciitis, bilateral 08/23/2013  . Unspecified vitamin D deficiency 08/23/2013  . Obesity, unspecified 08/23/2013  . Unspecified essential hypertension 12/31/2012  . GERD (gastroesophageal reflux disease) 12/31/2012  . COMMON MIGRAINE 06/16/2008  . CHEST PAIN, INTERMITTENT 06/16/2008    Past Surgical History:  Procedure Laterality Date  . CESAREAN SECTION      OB History    No data available       Home Medications    Prior to Admission medications   Medication Sig Start Date End Date Taking? Authorizing Provider  butalbital-acetaminophen-caffeine (FIORICET WITH CODEINE) [id number] MG capsule Take 1 capsule by mouth every 4 (four) hours as needed for headache. 02/25/17   McVey, Gelene Mink, PA-C  cholecalciferol (VITAMIN D) 1000 units tablet Take 1,000 Units by mouth daily.    [provider]  triamterene-hydrochlorothiazide (MAXZIDE-25) 37.5-25 MG per tablet  Take 1 tablet by mouth daily. 07/23/13 07/23/14  [name], MD  triamterene-hydrochlorothiazide (MAXZIDE-25) 37.5-25 MG tablet Take 1 tablet by mouth daily.    [provider]    Family History Family History  Problem Relation Age of Onset  . Diabetes Mother   . Hypertension Mother   . Hyperlipidemia Mother   . Cancer Father        colon cancer  . Diabetes Maternal Grandmother   . Hyperlipidemia Paternal Grandfather     Social History Social History  Substance Use Topics  . Smoking status: Never Smoker  . Smokeless tobacco: Never Used  . Alcohol use Yes     Comment: occ     Allergies   Patient has no known allergies.   Review of Systems Review of Systems  Unable to perform ROS: Patient unresponsive     Physical Exam Updated Vital Signs BP (!) 187/87   Pulse (!) 56   Temp 98.4 F (36.9 C) (Axillary)   Resp 19   LMP 02/05/2017   SpO2 99%   Physical Exam  Constitutional: She appears well-developed and well-nourished.  HENT:  Head: Normocephalic and atraumatic.  Eyes:  Right pupil midsize and reactive. Left pupil mid size and sluggishly reactive.  Cardiovascular: Regular rhythm.   No murmur heard. Bradycardiac  Pulmonary/Chest: Effort normal and breath sounds normal. No respiratory distress.  Abdominal: Soft. There is no tenderness. There is no rebound and no guarding.  Musculoskeletal: She exhibits no edema or tenderness.  Neurological:  Unresponsive. GCS 1-1-1  Skin: Skin is warm. She is diaphoretic.  Psychiatric: She has a normal mood and affect. Her behavior is normal.  Nursing note and vitals reviewed.    ED Treatments / Results  Labs (all labs ordered are listed, but only abnormal results are displayed) Labs Reviewed  COMPREHENSIVE METABOLIC PANEL  CBC  CBG MONITORING, ED  I-STAT CHEM 8, ED  I-STAT BETA HCG BLOOD, ED ([facility], [facility], [facility] ONLY)    EKG  EKG Interpretation None       Radiology No results  found.  Procedures Procedure Name: Intubation Date/Time: 03/03/2017 4:32 PM Performed by: Quintella Reichert Pre-anesthesia Checklist: Patient identified, Emergency Drugs available, Suction available and Patient being monitored Oxygen Delivery Method: Nasal cannula Intubation Type: Rapid sequence Laryngoscope Size: Glidescope and 3 Tube size: 7.5 mm Number of attempts: 1 Airway Equipment and Method: Video-laryngoscopy Placement Confirmation: ETT inserted through vocal cords under direct vision,  Positive ETCO2 and Breath sounds checked- equal and bilateral Secured at: 23 cm Tube secured with: Tape Dental Injury: Teeth and Oropharynx as per pre-operative assessment       (including critical care time) CRITICAL CARE Performed by: Quintella Reichert   Total critical care time: 60 minutes  Critical care time was exclusive of separately billable procedures and treating other patients.  Critical care was necessary to treat or prevent imminent or life-threatening deterioration.  Critical care was time spent personally by me on the following activities: development of treatment plan with patient and/or surrogate as well as nursing, discussions with consultants, evaluation of patient's response to treatment, examination of patient, obtaining history from patient or surrogate, ordering and performing treatments and interventions, ordering and review of laboratory studies, ordering and review of radiographic studies, pulse oximetry and re-evaluation of patient's condition.  Medications Ordered in ED Medications  levETIRAcetam (KEPPRA) 1,000 mg in sodium chloride 0.9 % 100 mL IVPB (not administered)  iopamidol (ISOVUE-370) 76 % injection (50 mLs  Contrast Given 03/03/17 1230)     Initial Impression / Assessment and Plan / ED Course  I have reviewed the triage vital signs and the nursing notes.  Pertinent labs & imaging results that were available during my care of the patient were reviewed by  me and considered in my medical decision making (see chart for details).     Patient presents to the ED for evaluation of headache. Patient on responsive shortly after ED arrival. 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 Patient unresponsive and intubated for airway protection and respiratory support. Concern for impending herniation given her exam and she was treated with mannitol. Cardene for uncontrolled hypertension. Keppra for seizure activity. Versed for seizure activity/sedation. Neurosurgery consulted for aneurysm with IVH. She was evaluated by neurosurgery at the bedside and ventricular drain placed in the ED. She was transferred emergently to the interventional suite for potential coiling of her aneurysm. Critical care consulted for comanagement. Family updated of patient's status and critical nature of her illness.  Final Clinical Impressions(s) / ED Diagnoses   Final diagnoses:  Subarachnoid hemorrhage ([place])  SAH (subarachnoid hemorrhage) ([place])  History of ETT  Encounter for intubation    New Prescriptions New Prescriptions   No medications on file     Quintella Reichert, MD 03/04/17 1619

## 2017-03-03 NOTE — ED Notes (Signed)
100 mg of Roc given rt pt posturing and biting on tube. Roc worked sufficiently and RN was able to pass OG down without difficulty. Gastric contents noted in suction container.

## 2017-03-03 NOTE — Progress Notes (Signed)
Pt transferred to 4N ICU, groin level 0- RN took report from CRNA and Radiology RN. IR team signing off

## 2017-03-03 NOTE — ED Notes (Signed)
ALL pt belongings given to husband, Caeleigh Prohaska.

## 2017-03-03 NOTE — ED Notes (Signed)
Contacted IR for Dr. Ralene Bathe.  IR states that they should be coming to get patient in approximately 10-15 minutes.

## 2017-03-03 NOTE — Progress Notes (Signed)
Increased the Pt's RR to 24 and decreased her FIO2 to 60% per verbal order from critical care. RT will continue to monitor.

## 2017-03-03 NOTE — H&P (Signed)
PULMONARY / CRITICAL CARE MEDICINE   Name: Carly [name] MRN: [id number] DOB: [birth year]    ADMISSION DATE:  03/03/2017 CONSULTATION DATE:  6/18  REFERRING MD:  Dr. Ralene Bathe EDP  CHIEF COMPLAINT: Headache  HISTORY OF PRESENT ILLNESS:   46 year old female with PMH as below, which is significant for HTN, GERD, and migraines admitted 6/18 for HA & focal seizure activity. CT head SAH w/ associated IVH in setting of presumed ruptured aneurysm. Developed progressive decreased LOC, vomited, postured. Intubated for airway protection and IVC placed per neuro-surg. PCCM asked to admit. Pending IR   PAST MEDICAL HISTORY :  She  has a past medical history of GERD (gastroesophageal reflux disease); Hypertension; and Migraines.  PAST SURGICAL HISTORY: She  has a past surgical history that includes Cesarean section.  No Known Allergies  No current facility-administered medications on file prior to encounter.    Current Outpatient Prescriptions on File Prior to Encounter  Medication Sig  . butalbital-acetaminophen-caffeine (FIORICET WITH CODEINE) 50-325-40-30 MG capsule Take 1 capsule by mouth every 4 (four) hours as needed for headache.  . triamterene-hydrochlorothiazide (MAXZIDE-25) 37.5-25 MG per tablet Take 1 tablet by mouth daily.    FAMILY HISTORY:  Her indicated that her mother is alive. She indicated that her father is deceased. She indicated that her maternal grandmother is deceased. She indicated that her paternal grandfather is deceased.    SOCIAL HISTORY: She  reports that she has never smoked. She has never used smokeless tobacco. She reports that she drinks alcohol. She reports that she does not use drugs.  REVIEW OF SYSTEMS:   Unable   SUBJECTIVE:  Unresponsive   VITAL SIGNS: BP (!) 121/48   Pulse (!) 49   Temp 98.4 F (36.9 C) (Axillary)   Resp (!) 22   LMP 02/05/2017   SpO2 100%   HEMODYNAMICS:    VENTILATOR SETTINGS: Vent Mode: PRVC FiO2 (%):  [100 %] 100  % Set Rate:  [18 bmp] 18 bmp PEEP:  [5 cmH20] 5 cmH20 Plateau Pressure:  [21 cmH20] 21 cmH20  INTAKE / OUTPUT: No intake/output data recorded.  PHYSICAL EXAMINATION: General appearance:  46 Year old  female, well nourished, sedated on vent Eyes: anicteric sclerae, moist conjunctivae; PERRL, EOMI bilaterally. Mouth:  membranes and no mucosal ulcerations; normal hard and soft palate, orally intubated Neck: Trachea midline; neck supple, no JVD Lungs/chest: scattered rhonchi, with normal respiratory effort and no intercostal retractions CV: RRR, no MRGs  Abdomen: Soft, non-tender; no masses or HSM Extremities: No peripheral edema or extremity lymphadenopathy Skin: Normal temperature, turgor and texture; no rash, ulcers or subcutaneous nodules Neuro/Psych: GCS 3 sedated on vent PERRL   LABS:  BMET  Recent Labs Lab 02/25/17 1352 03/03/17 1214  NA 143 137  K 4.1 3.3*  CL 100 104  CO2 28 23  BUN 9 7  CREATININE 1.32* 1.26*  GLUCOSE 97 147*    Electrolytes  Recent Labs Lab 02/25/17 1352 03/03/17 1214  CALCIUM 9.7 8.8*    CBC  Recent Labs Lab 02/25/17 1354  WBC 6.5  HGB 12.7  HCT 36.1*    Coag's No results for input(s): APTT, INR in the last 168 hours.  Sepsis Markers No results for input(s): LATICACIDVEN, PROCALCITON, O2SATVEN in the last 168 hours.  ABG No results for input(s): PHART, PCO2ART, PO2ART in the last 168 hours.  Liver Enzymes  Recent Labs Lab 02/25/17 1352 03/03/17 1214  AST 42* 45*  ALT 48* 44  ALKPHOS 92 77  BILITOT 0.2 0.7  ALBUMIN 4.1 3.9    Cardiac Enzymes No results for input(s): TROPONINI, PROBNP in the last 168 hours.  Glucose No results for input(s): GLUCAP in the last 168 hours.  Imaging Ct Angio Head W Or Wo Contrast  Result Date: 03/03/2017 CLINICAL DATA:  46 year old female presented to the ED with headache, and then collapsed, became unresponsive. EXAM: CT HEAD WITHOUT CONTRAST CT ANGIOGRAPHY HEAD AND NECK  TECHNIQUE: Multidetector CT imaging of the head and neck was performed both prior to and during during bolus administration of intravenous contrast. Multiplanar CT image reconstructions and MIPs were obtained to evaluate the vascular anatomy. Carotid stenosis measurements (when applicable) are obtained utilizing NASCET criteria, using the distal internal carotid diameter as the denominator. CONTRAST:  50 mL Isovue 370 COMPARISON:  [facility] noncontrast head CT 02/28/2017. FINDINGS: CT HEAD FINDINGS Brain: Acute intra-axial hemorrhage within the left inferior frontal gyrus encompassing 44 x 22 x 22 mm (estimated intra-axial blood volume 11 mL) and secondary extension from that hemorrhage into the ventricular system. Moderate to large volume of intraventricular hemorrhage. Mild ventriculomegaly. Edema surrounding the parenchymal hemorrhage site. Mild regional mass effect. There may be early transependymal edema. No superimposed basilar cistern subarachnoid hemorrhage. Basilar cisterns remain patent. No significant midline shift. No superimposed acute cortically based infarct. Calvarium and skull base: Intact. No acute osseous abnormality identified. Paranasal sinuses: Visualized paranasal sinuses and mastoids are stable and well pneumatized. Orbits: Negative orbit and scalp soft tissues. CTA NECK Skeleton: Probable benign vertebral hemangioma in the C3 body. No acute osseous abnormality identified. Upper chest: Pulmonary atelectasis. No superior mediastinal lymphadenopathy. Other neck: Mild thyromegaly. Partial effacement of the pharynx. Otherwise negative neck soft tissues. No cervical lymphadenopathy. Aortic arch: 3 vessel arch configuration, although the left vertebral artery arises from the proximal subclavian very near the arch. No arch atherosclerosis. No great vessel origin stenosis. Right carotid system: Mildly tortuous proximal right CCA. Negative right carotid bifurcation. Tortuous but otherwise  negative cervical right ICA. Left carotid system: Mildly tortuous left CCA at the thoracic inlet. Negative left carotid bifurcation. Tortuous but otherwise negative cervical left ICA. Vertebral arteries:No proximal subclavian artery plaque or stenosis. The left vertebral artery arises early. Normal right vertebral origin. Mildly dominant right vertebral artery. Tortuous bilateral V2 segments. No vertebral artery stenosis to the skullbase. CTA HEAD Posterior circulation: Dominant distal right vertebral artery. Normal right PICA origin. The left vertebral artery functionally terminates in PICA. The basilar artery is patent but diminutive. Fetal type bilateral PCA origins are suspected. Bilateral PCA branches are patent but irregular, particularly the left. Anterior circulation: Negative right ICA siphon. Right posterior communicating artery origin is within normal limits. Patent right ICA terminus. Patent MCA and ACA origins. Right A1 segment is tortuous. ACA branches are diffusely irregular, and severely stenotic in the a 2 segments. Right MCA branches are mildly to moderately irregular. No branch occlusion is identified. Left ICA siphon is patent to the terminus. Arising from the supraclinoid segment about 5 mm distal to the ophthalmic artery origin is superiorly directed aneurysm estimated at 5-6 mm in length by 3 mm diameter. Extravasation of contrast from the tip of the aneurysm into the left inferior frontal gyrus and ultimately the left lateral ventricle is demonstrated. See series 11, images 96 images 90-99, and series 16, images 25 and 26. The left MCA and ACA origins remain patent. The left ACA is severely irregular like that on the right side. Left MCA M1 segment and left MCA branches  are patent with mild irregularity. Venous sinuses: Not evaluated due to early contrast phase. Anatomic variants: Early left vertebral artery origin from the left subclavian. Review of the MIP images confirms the above  findings. IMPRESSION: 1. 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 2. 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 3. Critical Value/emergent results were called by telephone at the time of interpretation on 03/03/2017 at 1242 hours to RN Laveda Norman in the ED who verbally acknowledged and relayed these to Dr. Quintella Reichert , who was involved in patient intubation. I also then briefly discussed the case by telephone with Dr. Juliet Rude. 4. 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 5. Mild ventriculomegaly/transependymal edema. Absence subarachnoid hemorrhage at this time, and basilar cisterns remain patent. 6. Tortuous carotid arteries. No atherosclerosis or stenosis. No other intracranial aneurysm identified. Electronically Signed   By: Genevie Ann M.D.   On: 03/03/2017 13:11   Ct Head Wo Contrast  Result Date: 03/03/2017 CLINICAL DATA:  46 year old female presented to the ED with headache, and then collapsed, became unresponsive. EXAM: CT HEAD WITHOUT CONTRAST CT ANGIOGRAPHY HEAD AND NECK TECHNIQUE: Multidetector CT imaging of the head and neck was performed both prior to and during during bolus administration of intravenous contrast. Multiplanar CT image reconstructions and MIPs were obtained to evaluate the vascular anatomy. Carotid stenosis measurements (when applicable) are obtained utilizing NASCET criteria, using the distal internal carotid diameter as the denominator. CONTRAST:  50 mL Isovue 370 COMPARISON:  [facility] noncontrast head CT 02/28/2017. FINDINGS: CT HEAD FINDINGS Brain: Acute intra-axial hemorrhage within the left inferior frontal gyrus encompassing 44 x 22 x 22 mm (estimated intra-axial blood volume 11 mL) and secondary extension from that hemorrhage into  the ventricular system. Moderate to large volume of intraventricular hemorrhage. Mild ventriculomegaly. Edema surrounding the parenchymal hemorrhage site. Mild regional mass effect. There may be early transependymal edema. No superimposed basilar cistern subarachnoid hemorrhage. Basilar cisterns remain patent. No significant midline shift. No superimposed acute cortically based infarct. Calvarium and skull base: Intact. No acute osseous abnormality identified. Paranasal sinuses: Visualized paranasal sinuses and mastoids are stable and well pneumatized. Orbits: Negative orbit and scalp soft tissues. CTA NECK Skeleton: Probable benign vertebral hemangioma in the C3 body. No acute osseous abnormality identified. Upper chest: Pulmonary atelectasis. No superior mediastinal lymphadenopathy. Other neck: Mild thyromegaly. Partial effacement of the pharynx. Otherwise negative neck soft tissues. No cervical lymphadenopathy. Aortic arch: 3 vessel arch configuration, although the left vertebral artery arises from the proximal subclavian very near the arch. No arch atherosclerosis. No great vessel origin stenosis. Right carotid system: Mildly tortuous proximal right CCA. Negative right carotid bifurcation. Tortuous but otherwise negative cervical right ICA. Left carotid system: Mildly tortuous left CCA at the thoracic inlet. Negative left carotid bifurcation. Tortuous but otherwise negative cervical left ICA. Vertebral arteries:No proximal subclavian artery plaque or stenosis. The left vertebral artery arises early. Normal right vertebral origin. Mildly dominant right vertebral artery. Tortuous bilateral V2 segments. No vertebral artery stenosis to the skullbase. CTA HEAD Posterior circulation: Dominant distal right vertebral artery. Normal right PICA origin. The left vertebral artery functionally terminates in PICA. The basilar artery is patent but diminutive. Fetal type bilateral PCA origins are suspected. Bilateral PCA  branches are patent but irregular, particularly the left. Anterior circulation: Negative right ICA siphon. Right  posterior communicating artery origin is within normal limits. Patent right ICA terminus. Patent MCA and ACA origins. Right A1 segment is tortuous. ACA branches are diffusely irregular, and severely stenotic in the a 2 segments. Right MCA branches are mildly to moderately irregular. No branch occlusion is identified. Left ICA siphon is patent to the terminus. Arising from the supraclinoid segment about 5 mm distal to the ophthalmic artery origin is superiorly directed aneurysm estimated at 5-6 mm in length by 3 mm diameter. Extravasation of contrast from the tip of the aneurysm into the left inferior frontal gyrus and ultimately the left lateral ventricle is demonstrated. See series 11, images 96 images 90-99, and series 16, images 25 and 26. The left MCA and ACA origins remain patent. The left ACA is severely irregular like that on the right side. Left MCA M1 segment and left MCA branches are patent with mild irregularity. Venous sinuses: Not evaluated due to early contrast phase. Anatomic variants: Early left vertebral artery origin from the left subclavian. Review of the MIP images confirms the above findings. IMPRESSION: 1. 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 2. 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 3. Critical Value/emergent results were called by telephone at the time of interpretation on 03/03/2017 at 1242 hours to RN Laveda Norman in the ED who verbally acknowledged and relayed these to Dr. Quintella Reichert , who was involved in patient intubation. I also then briefly discussed the case by telephone with Dr. Juliet Rude. 4. Associated generalized intracranial artery Vasospasm, severe in the  bilateral ACAs. 5. Mild ventriculomegaly/transependymal edema. Absence subarachnoid hemorrhage at this time, and basilar cisterns remain patent. 6. Tortuous carotid arteries. No atherosclerosis or stenosis. No other intracranial aneurysm identified. Electronically Signed   By: Genevie Ann M.D.   On: 03/03/2017 13:11   Ct Angio Neck W And/or Wo Contrast  Result Date: 03/03/2017 CLINICAL DATA:  46 year old female presented to the ED with headache, and then collapsed, became unresponsive. EXAM: CT HEAD WITHOUT CONTRAST CT ANGIOGRAPHY HEAD AND NECK TECHNIQUE: Multidetector CT imaging of the head and neck was performed both prior to and during during bolus administration of intravenous contrast. Multiplanar CT image reconstructions and MIPs were obtained to evaluate the vascular anatomy. Carotid stenosis measurements (when applicable) are obtained utilizing NASCET criteria, using the distal internal carotid diameter as the denominator. CONTRAST:  50 mL Isovue 370 COMPARISON:  [facility] noncontrast head CT 02/28/2017. FINDINGS: CT HEAD FINDINGS Brain: Acute intra-axial hemorrhage within the left inferior frontal gyrus encompassing 44 x 22 x 22 mm (estimated intra-axial blood volume 11 mL) and secondary extension from that hemorrhage into the ventricular system. Moderate to large volume of intraventricular hemorrhage. Mild ventriculomegaly. Edema surrounding the parenchymal hemorrhage site. Mild regional mass effect. There may be early transependymal edema. No superimposed basilar cistern subarachnoid hemorrhage. Basilar cisterns remain patent. No significant midline shift. No superimposed acute cortically based infarct. Calvarium and skull base: Intact. No acute osseous abnormality identified. Paranasal sinuses: Visualized paranasal sinuses and mastoids are stable and well pneumatized. Orbits: Negative orbit and scalp soft tissues. CTA NECK Skeleton: Probable benign vertebral hemangioma in the C3 body. No acute  osseous abnormality identified. Upper chest: Pulmonary atelectasis. No superior mediastinal lymphadenopathy. Other neck: Mild thyromegaly. Partial effacement of the pharynx. Otherwise negative neck soft tissues. No cervical lymphadenopathy. Aortic arch: 3 vessel  arch configuration, although the left vertebral artery arises from the proximal subclavian very near the arch. No arch atherosclerosis. No great vessel origin stenosis. Right carotid system: Mildly tortuous proximal right CCA. Negative right carotid bifurcation. Tortuous but otherwise negative cervical right ICA. Left carotid system: Mildly tortuous left CCA at the thoracic inlet. Negative left carotid bifurcation. Tortuous but otherwise negative cervical left ICA. Vertebral arteries:No proximal subclavian artery plaque or stenosis. The left vertebral artery arises early. Normal right vertebral origin. Mildly dominant right vertebral artery. Tortuous bilateral V2 segments. No vertebral artery stenosis to the skullbase. CTA HEAD Posterior circulation: Dominant distal right vertebral artery. Normal right PICA origin. The left vertebral artery functionally terminates in PICA. The basilar artery is patent but diminutive. Fetal type bilateral PCA origins are suspected. Bilateral PCA branches are patent but irregular, particularly the left. Anterior circulation: Negative right ICA siphon. Right posterior communicating artery origin is within normal limits. Patent right ICA terminus. Patent MCA and ACA origins. Right A1 segment is tortuous. ACA branches are diffusely irregular, and severely stenotic in the a 2 segments. Right MCA branches are mildly to moderately irregular. No branch occlusion is identified. Left ICA siphon is patent to the terminus. Arising from the supraclinoid segment about 5 mm distal to the ophthalmic artery origin is superiorly directed aneurysm estimated at 5-6 mm in length by 3 mm diameter. Extravasation of contrast from the tip of the  aneurysm into the left inferior frontal gyrus and ultimately the left lateral ventricle is demonstrated. See series 11, images 96 images 90-99, and series 16, images 25 and 26. The left MCA and ACA origins remain patent. The left ACA is severely irregular like that on the right side. Left MCA M1 segment and left MCA branches are patent with mild irregularity. Venous sinuses: Not evaluated due to early contrast phase. Anatomic variants: Early left vertebral artery origin from the left subclavian. Review of the MIP images confirms the above findings. IMPRESSION: 1. 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 2. 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 3. Critical Value/emergent results were called by telephone at the time of interpretation on 03/03/2017 at 1242 hours to RN Laveda Norman in the ED who verbally acknowledged and relayed these to Dr. Quintella Reichert , who was involved in patient intubation. I also then briefly discussed the case by telephone with Dr. Juliet Rude. 4. 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 5. Mild ventriculomegaly/transependymal edema. Absence subarachnoid hemorrhage at this time, and basilar cisterns remain patent. 6. Tortuous carotid arteries. No atherosclerosis or stenosis. No other intracranial aneurysm identified. Electronically Signed   By: Genevie Ann M.D.   On: 03/03/2017 13:11   Dg Chest Portable 1 View  Result Date: 03/03/2017 CLINICAL DATA:  Post intubation. EXAM: PORTABLE CHEST 1 VIEW COMPARISON:  None. FINDINGS: Endotracheal tube is 1.8 cm above the carina. Low lung volumes. Cardiac pads on the chest. Negative for pneumothorax. Heart size is prominent but likely accentuated by the portable technique and low inspiratory effort. IMPRESSION: Low  lung volumes. Endotracheal tube is positioned above the carina. Electronically Signed   By: Markus Daft M.D.   On: 03/03/2017 13:14   STUDIES:  CTA 6/18: 1. 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 2. Positive for active extravasation of hemorrhage from a superiorly directed left supraclinoid ICA aneurysm (~5 x 3 mm).4. 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 5. Mild ventriculomegaly/transependymal edema. Absence subarachnoid hemorrhage at this time, and basilar cisterns remain patent. 6. Tortuous carotid arteries. No atherosclerosis or stenosis  CULTURES: Sputum 6/29>>>  ANTIBIOTICS:   SIGNIFICANT EVENTS:   LINES/TUBES: oett 6/18>>>  DISCUSSION: sah w/ IVH. S/p IVC. Awaiting IR +/- OR.   ASSESSMENT / PLAN:  PULMONARY A: Acute hypercarbic respiratory failure Ventilator dependent in setting of acute neurological insult  R/o aspiration  Low volume film No clear in filtrate  P:   Ve adjusted  PAD protocol  F/u abg  Sputum culture Trend fever curve; repeat cxr in am   CARDIOVASCULAR A:  HTN  P:  SBP goal < 140 in setting of [place] Tele  cycel CEs  RENAL A:   aki hypokalmia P:   IVFs Trend chem->at risk for AKI w/ dye load  GASTROINTESTINAL A:   No acute  P:   NPO PPI  HEMATOLOGIC A:   No acute  P:  scds Trend cbc Transfuse per protocol   INFECTIOUS A:   R/o aspiration  P:   Sputum culture   ENDOCRINE A:   Mild hyperglycemia P:   Trend   NEUROLOGIC A:   SAH w/ IVH s/p IVC placed in ED 6/18 P:   RASS goal: -1 sbp < 140 Scheduled nimodipine Awaiting neuro-IR Further recs per neuro and neuro-surg.    FAMILY  - Updates:   - Inter-disciplinary family meet or Palliative Care meeting due by:  6/25  Erick Colace ACNP-BC [place] Pager # [phone] OR #  [phone] if no answer   03/03/2017, 2:20 PM  Attending Note:  46 year old female with history of HTN who presents to the hospital with AMS and was noted to have [place].  Neurosurgery was called and a drain was placed.  Patient was taken from there to IR for coiling.  PCCM asked to assess with ventilator management.  On exam, lungs were clear.  I reviewed head CT myself, ICH noted.  Will admit to the ICU from IR.  Adjust vent for ABG that we will need upon arrival.  CXR upon arrival to the ICU.  AM labs ordered.  BP control per neurology.  Maintain NPO.  IV hydration.  Replace electrolytes.  AM labs ordered.  No family bedside when patient was seen.  The patient is critically ill with multiple organ systems failure and requires high complexity decision making for assessment and support, frequent evaluation and titration of therapies, application of advanced monitoring technologies and extensive interpretation of multiple databases.   Critical Care Time devoted to patient care services described in this note is  35  Minutes. This time reflects time of care of this signee Dr Jennet Maduro. This critical care time does not reflect procedure time, or teaching time or supervisory time of PA/NP/Med student/Med Resident etc but could involve care discussion time.  Rush Farmer, M.D. [facility] Pulmonary/Critical Care Medicine. Pager: [phone]. After hours pager: [phone].

## 2017-03-03 NOTE — Progress Notes (Signed)
Pt hypotensive via cuff pressure and ART line. Heather RN , notified CCM, with new orders received. Will continue to monitor. Lianne Bushy RN BSN

## 2017-03-04 ENCOUNTER — Inpatient Hospital Stay (HOSPITAL_COMMUNITY): Payer: 59

## 2017-03-04 ENCOUNTER — Encounter (HOSPITAL_COMMUNITY): Payer: Self-pay | Admitting: Neurosurgery

## 2017-03-04 DIAGNOSIS — J9600 Acute respiratory failure, unspecified whether with hypoxia or hypercapnia: Secondary | ICD-10-CM

## 2017-03-04 DIAGNOSIS — I609 Nontraumatic subarachnoid hemorrhage, unspecified: Secondary | ICD-10-CM

## 2017-03-04 LAB — POCT I-STAT, CHEM 8
BUN: 5 mg/dL — ABNORMAL LOW (ref 6–20)
Calcium, Ion: 1.05 mmol/L — ABNORMAL LOW (ref 1.15–1.40)
Chloride: 110 mmol/L (ref 101–111)
Creatinine, Ser: 0.8 mg/dL (ref 0.44–1.00)
Glucose, Bld: 108 mg/dL — ABNORMAL HIGH (ref 65–99)
HCT: 31 % — ABNORMAL LOW (ref 36.0–46.0)
Hemoglobin: 10.5 g/dL — ABNORMAL LOW (ref 12.0–15.0)
Potassium: 3.7 mmol/L (ref 3.5–5.1)
Sodium: 147 mmol/L — ABNORMAL HIGH (ref 135–145)
TCO2: 23 mmol/L (ref 0–100)

## 2017-03-04 LAB — CBC
HEMATOCRIT: 34.5 % — AB (ref 36.0–46.0)
Hemoglobin: 11.1 g/dL — ABNORMAL LOW (ref 12.0–15.0)
MCH: 27.7 pg (ref 26.0–34.0)
MCHC: 32.2 g/dL (ref 30.0–36.0)
MCV: 86 fL (ref 78.0–100.0)
Platelets: 168 10*3/uL (ref 150–400)
RBC: 4.01 MIL/uL (ref 3.87–5.11)
RDW: 15 % (ref 11.5–15.5)
WBC: 12.4 10*3/uL — AB (ref 4.0–10.5)

## 2017-03-04 LAB — BLOOD GAS, ARTERIAL
ACID-BASE DEFICIT: 3.1 mmol/L — AB (ref 0.0–2.0)
Bicarbonate: 20.8 mmol/L (ref 20.0–28.0)
FIO2: 60
MECHVT: 400 mL
O2 Saturation: 99.4 %
PEEP: 5 cmH2O
Patient temperature: 98.6
RATE: 24 resp/min
pCO2 arterial: 33.2 mmHg (ref 32.0–48.0)
pH, Arterial: 7.413 (ref 7.350–7.450)
pO2, Arterial: 191 mmHg — ABNORMAL HIGH (ref 83.0–108.0)

## 2017-03-04 LAB — BASIC METABOLIC PANEL
ANION GAP: 6 (ref 5–15)
BUN: 5 mg/dL — ABNORMAL LOW (ref 6–20)
CALCIUM: 7.5 mg/dL — AB (ref 8.9–10.3)
CHLORIDE: 116 mmol/L — AB (ref 101–111)
CO2: 23 mmol/L (ref 22–32)
CREATININE: 1.01 mg/dL — AB (ref 0.44–1.00)
GFR calc non Af Amer: >60 mL/min (ref >60)
Glucose, Bld: 143 mg/dL — ABNORMAL HIGH (ref 65–99)
Potassium: 4 mmol/L (ref 3.5–5.1)
SODIUM: 145 mmol/L (ref 135–145)

## 2017-03-04 LAB — GLUCOSE, CAPILLARY
GLUCOSE-CAPILLARY: 100 mg/dL — AB (ref 65–99)
GLUCOSE-CAPILLARY: 118 mg/dL — AB (ref 65–99)
GLUCOSE-CAPILLARY: 141 mg/dL — AB (ref 65–99)
Glucose-Capillary: 132 mg/dL — ABNORMAL HIGH (ref 65–99)
Glucose-Capillary: 114 mg/dL — ABNORMAL HIGH (ref 65–99)
Glucose-Capillary: 101 mg/dL — ABNORMAL HIGH (ref 65–99)

## 2017-03-04 LAB — PHOSPHORUS: PHOSPHORUS: 1.9 mg/dL — AB (ref 2.5–4.6)

## 2017-03-04 LAB — TROPONIN I: Troponin I: 0.13 ng/mL (ref <0.03)

## 2017-03-04 LAB — HIV ANTIBODY (ROUTINE TESTING W REFLEX): HIV Screen 4th Generation wRfx: NONREACTIVE

## 2017-03-04 LAB — MAGNESIUM: MAGNESIUM: 1.7 mg/dL (ref 1.7–2.4)

## 2017-03-04 MED ORDER — ROCURONIUM BROMIDE 50 MG/5ML IV SOLN
60.0000 mg | Freq: Once | INTRAVENOUS | Status: AC
  Administered 2017-03-04: 60 mg via INTRAVENOUS
  Filled 2017-03-04: qty 6

## 2017-03-04 MED ORDER — SODIUM CHLORIDE 0.45 % IV SOLN
INTRAVENOUS | Status: DC
  Administered 2017-03-04 – 2017-03-05 (×3): via INTRAVENOUS

## 2017-03-04 MED ORDER — WHITE PETROLATUM GEL
Status: AC
  Administered 2017-03-04: 23:00:00
  Filled 2017-03-04: qty 1

## 2017-03-04 MED ORDER — DOCUSATE SODIUM 50 MG/5ML PO LIQD
100.0000 mg | Freq: Two times a day (BID) | ORAL | Status: DC
Start: 2017-03-04 — End: 2017-03-11
  Administered 2017-03-04 – 2017-03-10 (×12): 100 mg
  Filled 2017-03-04 (×12): qty 10

## 2017-03-04 MED ORDER — ETOMIDATE 2 MG/ML IV SOLN
30.0000 mg | Freq: Once | INTRAVENOUS | Status: AC
  Administered 2017-03-04: 30 mg via INTRAVENOUS

## 2017-03-04 MED ORDER — MAGNESIUM SULFATE 2 GM/50ML IV SOLN
2.0000 g | Freq: Once | INTRAVENOUS | Status: AC
  Administered 2017-03-04: 2 g via INTRAVENOUS
  Filled 2017-03-04: qty 50

## 2017-03-04 MED FILL — Medication: Qty: 1 | Status: AC

## 2017-03-04 NOTE — Progress Notes (Signed)
PT Cancellation Note  Patient Details Name: Carly [name] MRN: [id number] DOB: 06-02-1971   Cancelled Treatment:    Reason Eval/Treat Not Completed: Patient not medically ready. Pt self-extubated this morning, requiring reintubation. Will continue to follow and complete PT evaluation when able.    [name] 03/04/2017, 10:23 AM  [name], PT, DPT Acute Rehabilitation Services Pager: [phone]

## 2017-03-04 NOTE — Progress Notes (Signed)
Continuing to have issues with drain leveled at patients tragus. Per PA, if drain is lower than tragus this is ok, due to the need for the drain to drain. Will continue to monitor. Fransico Michael RN BSN.

## 2017-03-04 NOTE — Progress Notes (Signed)
Pt self-extubated.  Placed on 100 % NRB.  Pt spontaneously breathing unable to follow commands.  Intubated by Dr. Milinda Hirschfeld and placed back on full support through vent.

## 2017-03-04 NOTE — Progress Notes (Signed)
OT Cancellation Note  Patient Details Name: Carly [name] MRN: [id number] DOB: Aug 25, 1971   Cancelled Treatment:    Reason Eval/Treat Not Completed: Other (comment). Pt on bedrest. Please update activity orders when appropriate for therapy. Thanks  Gary, OT/L  [phone] 03/04/2017 03/04/2017, 6:53 AM

## 2017-03-04 NOTE — Procedures (Signed)
EMERGENT ENDOTRACHEAL INTUBATION  Consent:  Procedure performed emergently post self-extubation.  Medications Administered: 1. Etomidate 30 mg IV push 2. Rocuronium 60 mg IV push  Description of Procedure:  Procedure was performed emergently after patient self extubated. Patient was intermittently opening eyes to voice but not following commands or verbalizing. Patient requiring 100% nonrebreather with borderline bradycardia. Given patient's continued altered mental status and inability to protect airway patient underwent endotracheal intubation with rapid sequence intubation. Utilizing a video laryngoscope and #3 blade as well as cricothyroid pressure I had excellent visualization of the patient's vocal cords. A #7.5 endotracheal tube was inserted between the vocal cords with ease. Repeat capnographic color change was appreciated as well as symmetric chest wall rise. Bilateral breath sounds were auscultated. Tube was secured at 24 cm at the lip. Patient maintain saturation and vitals throughout the procedure.  Condition:  Remains critically ill and in ICU.   Complications:  None.  Blood Loss:  None.

## 2017-03-04 NOTE — Progress Notes (Signed)
Contacted Georgann Housekeeper NP, patient Hr and rhythm are irregular with extended pauses. Some pauses have been noted to be 4-5 seconds long. NP will follow up.

## 2017-03-04 NOTE — Progress Notes (Signed)
SLP Cancellation Note  Patient Details Name: Carly [name] MRN: [id number] DOB: 1971/01/11   Cancelled treatment:       Reason Eval/Treat Not Completed: Medical issues which prohibited therapy. Pt self-extubated this morning, requiring reintubation. Will f/u as able.   Germain Osgood 03/04/2017, 8:31 AM  Germain Osgood, M.A. CCC-SLP [phone]

## 2017-03-04 NOTE — Progress Notes (Signed)
Transcranial Doppler  Date POD PCO2 HCT BP  MCA ACA PCA OPHT SIPH VERT Basilar  03/04/17 vs     Right  Left   57  71   -45  -45   53  67   16  14   *  51   *  *   *  *         Right  Left                                            Right  Left                                             Right  Left                                             Right  Left                                            Right  Left                                            Right  Left                                        MCA = Middle Cerebral Artery      OPHT = Opthalmic Artery     BASILAR = Basilar Artery   ACA = Anterior Cerebral Artery     SIPH = Carotid Siphon PCA = Posterior Cerebral Artery   VERT = Verterbral Artery                 Left Lindegaard ratio 3.07 Right lindegaard ratio 1.63 Normal MCA = 62+\-12 ACA = 50+\-12 PCA = 42+\-23  \03/04/17 Unable to obtain the vertebral due to ET tube stabilizer  [facility] , RVS 03/04/2017 6:20 PM

## 2017-03-04 NOTE — Care Management Note (Signed)
Case Management Note  Patient Details  Name: [name] MRN: [id number] Date of Birth: 1971-08-02  Subjective/Objective:  Pt admitted on 03/03/17 with AMS and ICH.  PTA, pt independent, lives with spouse.                    Action/Plan: Pt currently remains sedated and intubated.  Will follow for discharge planning as pt progresses.    Expected Discharge Date:                  Expected Discharge Plan:     In-House Referral:     Discharge planning Services  CM Consult  Post Acute Care Choice:    Choice offered to:     DME Arranged:    DME Agency:     HH Arranged:    HH Agency:     Status of Service:  In process, will continue to follow  If discussed at Long Length of Stay Meetings, dates discussed:    Additional Comments:  Reinaldo Raddle, RN, BSN  Trauma/Neuro ICU Case Manager [phone]

## 2017-03-04 NOTE — Progress Notes (Signed)
Fentanyl drip discontinued. 200 ml discarded down sink, witnessed by [employer]. Bag and line discarded according to policy.

## 2017-03-04 NOTE — Progress Notes (Signed)
Lab called with critical troponin of 0.13. MD paged with results.

## 2017-03-04 NOTE — Progress Notes (Signed)
PULMONARY / CRITICAL CARE MEDICINE   Name: HILLERY ZACHMAN MRN: [id number] DOB: 08-10-1971    ADMISSION DATE:  03/03/2017 CONSULTATION DATE:  6/18  REFERRING MD:  Dr. Ralene Bathe EDP  CHIEF COMPLAINT: Headache  HISTORY OF PRESENT ILLNESS:   46 year old female with PMH as below, which is significant for HTN, GERD, and migraines admitted 6/18 for HA & focal seizure activity. CT head SAH w/ associated IVH in setting of presumed ruptured aneurysm. Developed progressive decreased LOC, vomited, postured. Intubated for airway protection and IVC placed per neuro-surg.   SUBJECTIVE:  Successful coil placed in IR. Self extubated this AM, urgently reintubated for mental status.  VITAL SIGNS: BP (!) 146/98   Pulse (!) 24   Temp 98.8 F (37.1 C) (Axillary)   Resp (!) 24   Ht 5\' 7"  (1.702 m)   Wt 108 kg (238 lb 1.6 oz)   LMP 02/05/2017   SpO2 100%   BMI 37.29 kg/m   HEMODYNAMICS:    VENTILATOR SETTINGS: Vent Mode: PRVC FiO2 (%):  [40 %-100 %] 100 % Set Rate:  [18 bmp-24 bmp] 24 bmp Vt Set:  [400 mL] 400 mL PEEP:  [5 cmH20] 5 cmH20 Plateau Pressure:  [16 cmH20-21 cmH20] 18 cmH20  INTAKE / OUTPUT: I/O last 3 completed shifts: In: 3197.1 [I.V.:3197.1] Out: 7623 [Urine:7350; Drains:223; Blood:50]  PHYSICAL EXAMINATION:  General:  Overweight adult female in NAD Neuro:  Sedated on vent, does arouse and tracks with eyes on command. Does not follow with extremities. HEENT:  Ventricular drain in place, PERRL, no JVD Cardiovascular: brady, regular, no MRG Lungs:  Clear bilateral breath sounds Abdomen:  Soft, non-distended, non-tender Musculoskeletal:  No acute deformity Skin:  Intact, MMM   LABS:  BMET  Recent Labs Lab 02/25/17 1352 03/03/17 1214 03/04/17 0426  NA 143 137 145  K 4.1 3.3* 4.0  CL 100 104 116*  CO2 28 23 23   BUN 9 7 5*  CREATININE 1.32* 1.26* 1.01*  GLUCOSE 97 147* 143*    Electrolytes  Recent Labs Lab 02/25/17 1352 03/03/17 1214 03/04/17 0426  CALCIUM  9.7 8.8* 7.5*  MG  --   --  1.7  PHOS  --   --  1.9*    CBC  Recent Labs Lab 02/25/17 1354 03/03/17 2041 03/04/17 0639  WBC 6.5 9.9 PENDING  HGB 12.7 10.9* 11.1*  HCT 36.1* 33.5* 34.5*  PLT  --  192 168    Coag's  Recent Labs Lab 03/03/17 2041  APTT 31  INR 1.07    Sepsis Markers No results for input(s): LATICACIDVEN, PROCALCITON, O2SATVEN in the last 168 hours.  ABG  Recent Labs Lab 03/03/17 1420 03/04/17 0440  PHART 7.265* 7.413  PCO2ART 55.7* 33.2  PO2ART 150.0* 191*    Liver Enzymes  Recent Labs Lab 02/25/17 1352 03/03/17 1214  AST 42* 45*  ALT 48* 44  ALKPHOS 92 77  BILITOT 0.2 0.7  ALBUMIN 4.1 3.9    Cardiac Enzymes No results for input(s): TROPONINI, PROBNP in the last 168 hours.  Glucose  Recent Labs Lab 03/03/17 2016 03/03/17 2042 03/03/17 2303 03/04/17 0317  GLUCAP 69 86 70 141*    Imaging Ct Angio Head W Or Wo Contrast  Result Date: 03/03/2017 CLINICAL DATA:  46 year old female presented to the ED with headache, and then collapsed, became unresponsive. EXAM: CT HEAD WITHOUT CONTRAST CT ANGIOGRAPHY HEAD AND NECK TECHNIQUE: Multidetector CT imaging of the head and neck was performed both prior to and during during  bolus administration of intravenous contrast. Multiplanar CT image reconstructions and MIPs were obtained to evaluate the vascular anatomy. Carotid stenosis measurements (when applicable) are obtained utilizing NASCET criteria, using the distal internal carotid diameter as the denominator. CONTRAST:  50 mL Isovue 370 COMPARISON:  [facility] noncontrast head CT 02/28/2017. FINDINGS: CT HEAD FINDINGS Brain: Acute intra-axial hemorrhage within the left inferior frontal gyrus encompassing 44 x 22 x 22 mm (estimated intra-axial blood volume 11 mL) and secondary extension from that hemorrhage into the ventricular system. Moderate to large volume of intraventricular hemorrhage. Mild ventriculomegaly. Edema surrounding the  parenchymal hemorrhage site. Mild regional mass effect. There may be early transependymal edema. No superimposed basilar cistern subarachnoid hemorrhage. Basilar cisterns remain patent. No significant midline shift. No superimposed acute cortically based infarct. Calvarium and skull base: Intact. No acute osseous abnormality identified. Paranasal sinuses: Visualized paranasal sinuses and mastoids are stable and well pneumatized. Orbits: Negative orbit and scalp soft tissues. CTA NECK Skeleton: Probable benign vertebral hemangioma in the C3 body. No acute osseous abnormality identified. Upper chest: Pulmonary atelectasis. No superior mediastinal lymphadenopathy. Other neck: Mild thyromegaly. Partial effacement of the pharynx. Otherwise negative neck soft tissues. No cervical lymphadenopathy. Aortic arch: 3 vessel arch configuration, although the left vertebral artery arises from the proximal subclavian very near the arch. No arch atherosclerosis. No great vessel origin stenosis. Right carotid system: Mildly tortuous proximal right CCA. Negative right carotid bifurcation. Tortuous but otherwise negative cervical right ICA. Left carotid system: Mildly tortuous left CCA at the thoracic inlet. Negative left carotid bifurcation. Tortuous but otherwise negative cervical left ICA. Vertebral arteries:No proximal subclavian artery plaque or stenosis. The left vertebral artery arises early. Normal right vertebral origin. Mildly dominant right vertebral artery. Tortuous bilateral V2 segments. No vertebral artery stenosis to the skullbase. CTA HEAD Posterior circulation: Dominant distal right vertebral artery. Normal right PICA origin. The left vertebral artery functionally terminates in PICA. The basilar artery is patent but diminutive. Fetal type bilateral PCA origins are suspected. Bilateral PCA branches are patent but irregular, particularly the left. Anterior circulation: Negative right ICA siphon. Right posterior  communicating artery origin is within normal limits. Patent right ICA terminus. Patent MCA and ACA origins. Right A1 segment is tortuous. ACA branches are diffusely irregular, and severely stenotic in the a 2 segments. Right MCA branches are mildly to moderately irregular. No branch occlusion is identified. Left ICA siphon is patent to the terminus. Arising from the supraclinoid segment about 5 mm distal to the ophthalmic artery origin is superiorly directed aneurysm estimated at 5-6 mm in length by 3 mm diameter. Extravasation of contrast from the tip of the aneurysm into the left inferior frontal gyrus and ultimately the left lateral ventricle is demonstrated. See series 11, images 96 images 90-99, and series 16, images 25 and 26. The left MCA and ACA origins remain patent. The left ACA is severely irregular like that on the right side. Left MCA M1 segment and left MCA branches are patent with mild irregularity. Venous sinuses: Not evaluated due to early contrast phase. Anatomic variants: Early left vertebral artery origin from the left subclavian. Review of the MIP images confirms the above findings. IMPRESSION: 1. 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 2. 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 3. Critical Value/emergent results were called by telephone at the time of interpretation  on 03/03/2017 at 1242 hours to RN Laveda Norman in the ED who verbally acknowledged and relayed these to Dr. Quintella Reichert , who was involved in patient intubation. I also then briefly discussed the case by telephone with Dr. Juliet Rude. 4. 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 5. Mild ventriculomegaly/transependymal edema. Absence subarachnoid hemorrhage at this time, and basilar cisterns  remain patent. 6. Tortuous carotid arteries. No atherosclerosis or stenosis. No other intracranial aneurysm identified. Electronically Signed   By: Genevie Ann M.D.   On: 03/03/2017 13:11   Ct Head Wo Contrast  Result Date: 03/03/2017 CLINICAL DATA:  46 year old female presented to the ED with headache, and then collapsed, became unresponsive. EXAM: CT HEAD WITHOUT CONTRAST CT ANGIOGRAPHY HEAD AND NECK TECHNIQUE: Multidetector CT imaging of the head and neck was performed both prior to and during during bolus administration of intravenous contrast. Multiplanar CT image reconstructions and MIPs were obtained to evaluate the vascular anatomy. Carotid stenosis measurements (when applicable) are obtained utilizing NASCET criteria, using the distal internal carotid diameter as the denominator. CONTRAST:  50 mL Isovue 370 COMPARISON:  [facility] noncontrast head CT 02/28/2017. FINDINGS: CT HEAD FINDINGS Brain: Acute intra-axial hemorrhage within the left inferior frontal gyrus encompassing 44 x 22 x 22 mm (estimated intra-axial blood volume 11 mL) and secondary extension from that hemorrhage into the ventricular system. Moderate to large volume of intraventricular hemorrhage. Mild ventriculomegaly. Edema surrounding the parenchymal hemorrhage site. Mild regional mass effect. There may be early transependymal edema. No superimposed basilar cistern subarachnoid hemorrhage. Basilar cisterns remain patent. No significant midline shift. No superimposed acute cortically based infarct. Calvarium and skull base: Intact. No acute osseous abnormality identified. Paranasal sinuses: Visualized paranasal sinuses and mastoids are stable and well pneumatized. Orbits: Negative orbit and scalp soft tissues. CTA NECK Skeleton: Probable benign vertebral hemangioma in the C3 body. No acute osseous abnormality identified. Upper chest: Pulmonary atelectasis. No superior mediastinal lymphadenopathy. Other neck: Mild thyromegaly. Partial  effacement of the pharynx. Otherwise negative neck soft tissues. No cervical lymphadenopathy. Aortic arch: 3 vessel arch configuration, although the left vertebral artery arises from the proximal subclavian very near the arch. No arch atherosclerosis. No great vessel origin stenosis. Right carotid system: Mildly tortuous proximal right CCA. Negative right carotid bifurcation. Tortuous but otherwise negative cervical right ICA. Left carotid system: Mildly tortuous left CCA at the thoracic inlet. Negative left carotid bifurcation. Tortuous but otherwise negative cervical left ICA. Vertebral arteries:No proximal subclavian artery plaque or stenosis. The left vertebral artery arises early. Normal right vertebral origin. Mildly dominant right vertebral artery. Tortuous bilateral V2 segments. No vertebral artery stenosis to the skullbase. CTA HEAD Posterior circulation: Dominant distal right vertebral artery. Normal right PICA origin. The left vertebral artery functionally terminates in PICA. The basilar artery is patent but diminutive. Fetal type bilateral PCA origins are suspected. Bilateral PCA branches are patent but irregular, particularly the left. Anterior circulation: Negative right ICA siphon. Right posterior communicating artery origin is within normal limits. Patent right ICA terminus. Patent MCA and ACA origins. Right A1 segment is tortuous. ACA branches are diffusely irregular, and severely stenotic in the a 2 segments. Right MCA branches are mildly to moderately irregular. No branch occlusion is identified. Left ICA siphon is patent to the terminus. Arising from the supraclinoid segment about 5 mm distal to the ophthalmic artery origin is superiorly directed aneurysm estimated at 5-6 mm in length by 3 mm diameter. Extravasation of contrast from the tip of the  aneurysm into the left inferior frontal gyrus and ultimately the left lateral ventricle is demonstrated. See series 11, images 96 images 90-99, and  series 16, images 25 and 26. The left MCA and ACA origins remain patent. The left ACA is severely irregular like that on the right side. Left MCA M1 segment and left MCA branches are patent with mild irregularity. Venous sinuses: Not evaluated due to early contrast phase. Anatomic variants: Early left vertebral artery origin from the left subclavian. Review of the MIP images confirms the above findings. IMPRESSION: 1. 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 2. 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 3. Critical Value/emergent results were called by telephone at the time of interpretation on 03/03/2017 at 1242 hours to RN Laveda Norman in the ED who verbally acknowledged and relayed these to Dr. Quintella Reichert , who was involved in patient intubation. I also then briefly discussed the case by telephone with Dr. Juliet Rude. 4. 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 5. Mild ventriculomegaly/transependymal edema. Absence subarachnoid hemorrhage at this time, and basilar cisterns remain patent. 6. Tortuous carotid arteries. No atherosclerosis or stenosis. No other intracranial aneurysm identified. Electronically Signed   By: Genevie Ann M.D.   On: 03/03/2017 13:11   Ct Angio Neck W And/or Wo Contrast  Result Date: 03/03/2017 CLINICAL DATA:  46 year old female presented to the ED with headache, and then collapsed, became unresponsive. EXAM: CT HEAD WITHOUT CONTRAST CT ANGIOGRAPHY HEAD AND NECK TECHNIQUE: Multidetector CT imaging of the head and neck was performed both prior to and during during bolus administration of intravenous contrast. Multiplanar CT image reconstructions and MIPs were obtained to evaluate the vascular anatomy. Carotid stenosis measurements (when applicable) are  obtained utilizing NASCET criteria, using the distal internal carotid diameter as the denominator. CONTRAST:  50 mL Isovue 370 COMPARISON:  [facility] noncontrast head CT 02/28/2017. FINDINGS: CT HEAD FINDINGS Brain: Acute intra-axial hemorrhage within the left inferior frontal gyrus encompassing 44 x 22 x 22 mm (estimated intra-axial blood volume 11 mL) and secondary extension from that hemorrhage into the ventricular system. Moderate to large volume of intraventricular hemorrhage. Mild ventriculomegaly. Edema surrounding the parenchymal hemorrhage site. Mild regional mass effect. There may be early transependymal edema. No superimposed basilar cistern subarachnoid hemorrhage. Basilar cisterns remain patent. No significant midline shift. No superimposed acute cortically based infarct. Calvarium and skull base: Intact. No acute osseous abnormality identified. Paranasal sinuses: Visualized paranasal sinuses and mastoids are stable and well pneumatized. Orbits: Negative orbit and scalp soft tissues. CTA NECK Skeleton: Probable benign vertebral hemangioma in the C3 body. No acute osseous abnormality identified. Upper chest: Pulmonary atelectasis. No superior mediastinal lymphadenopathy. Other neck: Mild thyromegaly. Partial effacement of the pharynx. Otherwise negative neck soft tissues. No cervical lymphadenopathy. Aortic arch: 3 vessel arch configuration, although the left vertebral artery arises from the proximal subclavian very near the arch. No arch atherosclerosis. No great vessel origin stenosis. Right carotid system: Mildly tortuous proximal right CCA. Negative right carotid bifurcation. Tortuous but otherwise negative cervical right ICA. Left carotid system: Mildly tortuous left CCA at the thoracic inlet. Negative left carotid bifurcation. Tortuous but otherwise negative cervical left ICA. Vertebral arteries:No proximal subclavian artery plaque or stenosis. The left vertebral artery arises early. Normal  right vertebral origin. Mildly dominant right vertebral artery. Tortuous bilateral V2  segments. No vertebral artery stenosis to the skullbase. CTA HEAD Posterior circulation: Dominant distal right vertebral artery. Normal right PICA origin. The left vertebral artery functionally terminates in PICA. The basilar artery is patent but diminutive. Fetal type bilateral PCA origins are suspected. Bilateral PCA branches are patent but irregular, particularly the left. Anterior circulation: Negative right ICA siphon. Right posterior communicating artery origin is within normal limits. Patent right ICA terminus. Patent MCA and ACA origins. Right A1 segment is tortuous. ACA branches are diffusely irregular, and severely stenotic in the a 2 segments. Right MCA branches are mildly to moderately irregular. No branch occlusion is identified. Left ICA siphon is patent to the terminus. Arising from the supraclinoid segment about 5 mm distal to the ophthalmic artery origin is superiorly directed aneurysm estimated at 5-6 mm in length by 3 mm diameter. Extravasation of contrast from the tip of the aneurysm into the left inferior frontal gyrus and ultimately the left lateral ventricle is demonstrated. See series 11, images 96 images 90-99, and series 16, images 25 and 26. The left MCA and ACA origins remain patent. The left ACA is severely irregular like that on the right side. Left MCA M1 segment and left MCA branches are patent with mild irregularity. Venous sinuses: Not evaluated due to early contrast phase. Anatomic variants: Early left vertebral artery origin from the left subclavian. Review of the MIP images confirms the above findings. IMPRESSION: 1. 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 2. 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 3.  Critical Value/emergent results were called by telephone at the time of interpretation on 03/03/2017 at 1242 hours to RN Laveda Norman in the ED who verbally acknowledged and relayed these to Dr. Quintella Reichert , who was involved in patient intubation. I also then briefly discussed the case by telephone with Dr. Juliet Rude. 4. 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 5. Mild ventriculomegaly/transependymal edema. Absence subarachnoid hemorrhage at this time, and basilar cisterns remain patent. 6. Tortuous carotid arteries. No atherosclerosis or stenosis. No other intracranial aneurysm identified. Electronically Signed   By: Genevie Ann M.D.   On: 03/03/2017 13:11   Dg Chest Port 1 View  Result Date: 03/04/2017 CLINICAL DATA:  46 year old hypertensive female with endotracheal tube in place. Subsequent encounter. EXAM: PORTABLE CHEST 1 VIEW COMPARISON:  03/03/2017. FINDINGS: Endotracheal tube tip 2 cm above the carina. Nasogastric tube courses below the diaphragm. Tip is not included on the present exam. Hazy opacity left lung may be related to rotation rather than posteriorly layering left-sided pleural effusion. No segmental consolidation or pulmonary edema. Heart size within normal limits. IMPRESSION: Endotracheal tube tip 2 cm above the carina. Hazy opacity left lung may be related to rotation rather than posteriorly layering left-sided pleural effusion. Electronically Signed   By: Genia Del M.D.   On: 03/04/2017 07:07   Dg Chest Portable 1 View  Result Date: 03/03/2017 CLINICAL DATA:  Post intubation. EXAM: PORTABLE CHEST 1 VIEW COMPARISON:  None. FINDINGS: Endotracheal tube is 1.8 cm above the carina. Low lung volumes. Cardiac pads on the chest. Negative for pneumothorax. Heart size is prominent but likely accentuated by the portable technique and low inspiratory effort. IMPRESSION: Low lung volumes. Endotracheal tube is positioned above the carina.  Electronically Signed   By: Markus Daft M.D.   On:  03/03/2017 13:14   STUDIES:  CTA 6/18: Ruptured intracranial aneurysm with hemorrhage into the left inferior frontal gyrus and then into the ventricular system via of the left lateral ventricle. 2. Positive for active extravasation of hemorrhage from a superiorly directed left supraclinoid ICA aneurysm (~5 x 3 mm).4. 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 5. Mild ventriculomegaly/transependymal edema. Absence subarachnoid hemorrhage at this time, and basilar cisterns remain patent. 6. Tortuous carotid arteries. No atherosclerosis or stenosis  CULTURES: Sputum 6/29 >>>  ANTIBIOTICS:   SIGNIFICANT EVENTS:   LINES/TUBES: oett 6/18>>>  DISCUSSION: 46 year old female admitted 6/18 with SAH w/ IVH. IVC placed in ED. Aneurysm coiled in IR. To ICU on vent.   ASSESSMENT / PLAN:  PULMONARY A: Acute hypercarbic respiratory failure Ventilator dependent in setting of acute neurological insult  R/o aspiration   P:   Full vent support Sputum culture Decrease FiO2 Trend fever curve; repeat cxr in am   CARDIOVASCULAR A:  HTN   P:  SBP goal < 140 in setting of SAH Phenylephrine/Nicardipine as needed Telemetry monitoring Holding home Maxide  RENAL A:   AKI Hypokalemia  P:   IVF change to 1/2NS Trend chemistry  GASTROINTESTINAL A:   No acute  P:   NPO PPI  HEMATOLOGIC A:   No acute  P:  SCDs Trend CBC Transfuse per protocol   INFECTIOUS A:   R/o aspiration  P:   Sputum culture  Holding ABX for now  ENDOCRINE A:   Mild hyperglycemia P:   Follow glucose on chemistry SSI for consecutive glucose > 180  NEUROLOGIC A:   SAH w/ IVH s/p IVC placed in ED 6/18. Aneurysm coiled 6/18. P:   RASS goal: -1 sbp < 140 Management per neuro/neurosurgery.  Keppra Nimodipine  FAMILY  - Updates: no family available  - Inter-disciplinary family meet or Palliative Care  meeting due by:  6/25  Georgann Housekeeper, AGACNP-BC [facility] Pager [phone] or [phone]  03/04/2017 8:37 AM

## 2017-03-04 NOTE — Progress Notes (Signed)
Self extubated; reintubated without difficulty. EVD draining but with clot; flushed and working well Opens eyes to painful stimulus Intermittently following commands on the left, dense right hemiparesis Condition remains guarded Continue supportive care

## 2017-03-04 NOTE — Progress Notes (Signed)
CCM notified of patients heart rate sustaining in the 40's with occassional drops into the 30's. MD stated to continue to monitor, with no new orders at this time. Will continue to monitor. Lianne Bushy RN BSN

## 2017-03-04 NOTE — Progress Notes (Signed)
   03/04/17 1700  Clinical Encounter Type  Visited With Patient;Health care provider  Visit Type Follow-up;Spiritual support  Consult/Referral To Chaplain  Spiritual Encounters  Spiritual Needs Emotional  Stress Factors  Patient Stress Factors None identified    Followed up with patient to see if family needed support post ED. Family not currently available, spoke with nurse. Provided ministry of presence. Cree Kunert L. Volanda Napoleon, MDiv

## 2017-03-04 NOTE — Progress Notes (Signed)
Patient self-extubated shortly after shift change. RN had just exited room, patient had mittens on for safety. Re-intubated with no complications. Patien now in restraints.

## 2017-03-05 ENCOUNTER — Other Ambulatory Visit (HOSPITAL_COMMUNITY): Payer: Commercial Indemnity

## 2017-03-05 ENCOUNTER — Inpatient Hospital Stay (HOSPITAL_COMMUNITY): Payer: 59

## 2017-03-05 ENCOUNTER — Encounter (HOSPITAL_COMMUNITY): Payer: Self-pay | Admitting: Emergency Medicine

## 2017-03-05 LAB — GLUCOSE, CAPILLARY
GLUCOSE-CAPILLARY: 67 mg/dL (ref 65–99)
GLUCOSE-CAPILLARY: 94 mg/dL (ref 65–99)
GLUCOSE-CAPILLARY: 70 mg/dL (ref 65–99)
Glucose-Capillary: 146 mg/dL — ABNORMAL HIGH (ref 65–99)
Glucose-Capillary: 111 mg/dL — ABNORMAL HIGH (ref 65–99)
Glucose-Capillary: 101 mg/dL — ABNORMAL HIGH (ref 65–99)
Glucose-Capillary: 69 mg/dL (ref 65–99)
Glucose-Capillary: 64 mg/dL — ABNORMAL LOW (ref 65–99)
Glucose-Capillary: 95 mg/dL (ref 65–99)

## 2017-03-05 LAB — CBC
HCT: 33 % — ABNORMAL LOW (ref 36.0–46.0)
Hemoglobin: 10.5 g/dL — ABNORMAL LOW (ref 12.0–15.0)
MCH: 26.9 pg (ref 26.0–34.0)
MCHC: 31.8 g/dL (ref 30.0–36.0)
MCV: 84.4 fL (ref 78.0–100.0)
PLATELETS: 159 10*3/uL (ref 150–400)
RBC: 3.91 MIL/uL (ref 3.87–5.11)
RDW: 15.2 % (ref 11.5–15.5)
WBC: 12.2 10*3/uL — ABNORMAL HIGH (ref 4.0–10.5)

## 2017-03-05 LAB — BASIC METABOLIC PANEL
Anion gap: 8 (ref 5–15)
BUN: 5 mg/dL — AB (ref 6–20)
CO2: 23 mmol/L (ref 22–32)
CREATININE: 0.95 mg/dL (ref 0.44–1.00)
Calcium: 8.1 mg/dL — ABNORMAL LOW (ref 8.9–10.3)
Chloride: 113 mmol/L — ABNORMAL HIGH (ref 101–111)
GFR calc Af Amer: >60 mL/min (ref >60)
Glucose, Bld: 128 mg/dL — ABNORMAL HIGH (ref 65–99)
POTASSIUM: 3.4 mmol/L — AB (ref 3.5–5.1)
SODIUM: 144 mmol/L (ref 135–145)

## 2017-03-05 LAB — TROPONIN I: TROPONIN I: 0.07 ng/mL — AB (ref <0.03)

## 2017-03-05 MED ORDER — DEXTROSE 50 % IV SOLN
INTRAVENOUS | Status: AC
  Administered 2017-03-05: 50 mL
  Filled 2017-03-05: qty 50

## 2017-03-05 MED ORDER — SODIUM CHLORIDE 0.9 % IV BOLUS (SEPSIS)
500.0000 mL | Freq: Once | INTRAVENOUS | Status: AC
  Administered 2017-03-05: 500 mL via INTRAVENOUS

## 2017-03-05 MED ORDER — POTASSIUM CHLORIDE 10 MEQ/100ML IV SOLN
10.0000 meq | INTRAVENOUS | Status: AC
  Administered 2017-03-05 (×2): 10 meq via INTRAVENOUS
  Filled 2017-03-05 (×2): qty 100

## 2017-03-05 NOTE — Progress Notes (Signed)
Pt attempted to self-extubate again.  Tube had come out 3 cm and able to push ET tube back down and in good position.  Confirmed with ETCO2 detector with positive color change, Bilat BS equal with good chest rise.  X-ray to confirm as well.  OG was pulled out completely.

## 2017-03-05 NOTE — Progress Notes (Signed)
OT Cancellation Note  Patient Details Name: Carly [name] MRN: [id number] DOB: [birth year]   Cancelled Treatment:    Reason Eval/Treat Not Completed: Patient not medically ready. Pt sedated/intubated/on bedrest.  [place], OT/L  [phone] 03/05/2017 03/05/2017, 7:06 AM

## 2017-03-05 NOTE — Progress Notes (Signed)
SLP Cancellation Note  Patient Details Name: MADALENA KESECKER MRN: [id number] DOB: [birth year]   Cancelled treatment:       Reason Eval/Treat Not Completed: Medical issues which prohibited therapy (remains intubated/sedated)   Germain Osgood 03/05/2017, 8:06 AM  Germain Osgood, M.A. CCC-SLP [phone]

## 2017-03-05 NOTE — Progress Notes (Signed)
Cortrak Tube Team Note:  Consult received to place a Cortrak feeding tube.   A 10 F Cortrak tube was placed in the R nare and secured with a nasal bridle at 77 cm. Per the Cortrak monitor reading the tube tip is post-pyloric.   No x-ray is required. RN may begin using tube.   If the tube becomes dislodged please keep the tube and contact the Cortrak team at [email or web address] (password [email or web address]) for replacement.  If after hours and replacement cannot be delayed, place a NG tube and confirm placement with an abdominal x-ray.      Jarome Matin, MS, RD, LDN, [facility] Inpatient Clinical Dietitian Pager # [phone] After hours/weekend pager # [phone]

## 2017-03-05 NOTE — Progress Notes (Signed)
PULMONARY / CRITICAL CARE MEDICINE   Name: Carly [name] MRN: [id number] DOB: 09/27/1970    ADMISSION DATE:  03/03/2017 CONSULTATION DATE:  6/18  REFERRING MD:  Dr. Ralene Bathe EDP  CHIEF COMPLAINT: Headache  Brief:   46 year old female with PMH as below, which is significant for HTN, GERD, and migraines admitted 6/18 for HA & focal seizure activity. CT head SAH w/ associated IVH in setting of presumed ruptured aneurysm. Developed progressive decreased LOC, vomited, postured. Intubated for airway protection and IVC placed per neuro-surg.   SUBJECTIVE:  This morning patient attempted to pull out ETT tube. No issues overnight.   VITAL SIGNS: BP (!) 97/54 (BP Location: Right Arm)   Pulse 63   Temp 98.4 F (36.9 C) (Axillary)   Resp 17   Ht 5\' 7"  (1.702 m)   Wt 108 kg (238 lb 1.6 oz)   LMP 02/05/2017   SpO2 100%   BMI 37.29 kg/m   HEMODYNAMICS:    VENTILATOR SETTINGS: Vent Mode: PRVC FiO2 (%):  [40 %] 40 % Set Rate:  [18 bmp] 18 bmp Vt Set:  [400 mL] 400 mL PEEP:  [5 cmH20] 5 cmH20 Plateau Pressure:  [12 cmH20-16 cmH20] 16 cmH20  INTAKE / OUTPUT: I/O last 3 completed shifts: In: 3972.1 [I.V.:3972.1] Out: 6212 [Urine:5075; Emesis/NG output:660; Drains:427; Blood:50]  PHYSICAL EXAMINATION:  General appearance:  Adult female, no distress  Eyes:  PERRL Mouth:  membranes and no mucosal ulcerations, ETT in place  Neck: Trachea midline; neck supple, no JVD Lungs/chest: Coarse, non-labored, no wheeze/crackles   CV: RRR, no MRGs  Abdomen: Obese, Soft, non-tender Extremities: -edema  Skin: warm, dry, intact, IVC in place  Psych: Follows commands, moves extremities    LABS:  BMET  Recent Labs Lab 03/03/17 1214 03/03/17 1823 03/04/17 0426 03/05/17 0230  NA 137 147* 145 144  K 3.3* 3.7 4.0 3.4*  CL 104 110 116* 113*  CO2 23  --  23 23  BUN 7 5* 5* 5*  CREATININE 1.26* 0.80 1.01* 0.95  GLUCOSE 147* 108* 143* 128*    Electrolytes  Recent Labs Lab 03/03/17 1214  03/04/17 0426 03/05/17 0230  CALCIUM 8.8* 7.5* 8.1*  MG  --  1.7  --   PHOS  --  1.9*  --     CBC  Recent Labs Lab 03/03/17 2041 03/04/17 0639 03/05/17 0230  WBC 9.9 12.4* 12.2*  HGB 10.9* 11.1* 10.5*  HCT 33.5* 34.5* 33.0*  PLT 192 168 159    Coag's  Recent Labs Lab 03/03/17 2041  APTT 31  INR 1.07    Sepsis Markers No results for input(s): LATICACIDVEN, PROCALCITON, O2SATVEN in the last 168 hours.  ABG  Recent Labs Lab 03/03/17 1420 03/04/17 0440  PHART 7.265* 7.413  PCO2ART 55.7* 33.2  PO2ART 150.0* 191*    Liver Enzymes  Recent Labs Lab 03/03/17 1214  AST 45*  ALT 44  ALKPHOS 77  BILITOT 0.7  ALBUMIN 3.9    Cardiac Enzymes  Recent Labs Lab 03/04/17 1202 03/05/17 0230  TROPONINI 0.13* 0.07*    Glucose  Recent Labs Lab 03/04/17 1143 03/04/17 1540 03/04/17 1929 03/04/17 2337 03/05/17 0346 03/05/17 0801  GLUCAP 114* 101* 100* 118* 146* 111*    Imaging Dg Chest Port 1 View  Result Date: 03/05/2017 CLINICAL DATA:  Re-intubation EXAM: PORTABLE CHEST 1 VIEW COMPARISON:  03/05/2017 FINDINGS: Endotracheal tube is again identified in satisfactory position. The nasogastric catheter has been removed in the interval. Cardiac shadow is  stable. The lungs are well aerated. Some very minimal right basilar atelectasis is now seen. No bony abnormality is noted. IMPRESSION: Minimal right basilar atelectasis. Endotracheal tube in satisfactory position. The nasogastric catheter has been removed. Electronically Signed   By: Inez Catalina M.D.   On: 03/05/2017 08:26   Dg Chest Port 1 View  Result Date: 03/05/2017 CLINICAL DATA:  Check endotracheal tube placement EXAM: PORTABLE CHEST 1 VIEW COMPARISON:  03/04/2017 FINDINGS: Cardiac shadow is mildly prominent but accentuated by portable technique. Endotracheal tube is noted in satisfactory position. The nasogastric catheter has been withdrawn and now lies in the mid esophagus. This should be advanced as  clinically indicated. The lungs are well-aerated without focal infiltrate. No bony abnormality is seen. IMPRESSION: Endotracheal tube in satisfactory position. There is been interval withdrawal of the nasogastric catheter. This should be advanced as clinically indicated. Clearing Electronically Signed   By: Inez Catalina M.D.   On: 03/05/2017 07:15   Dg Chest Port 1 View  Result Date: 03/04/2017 CLINICAL DATA:  Intubation. EXAM: PORTABLE CHEST 1 VIEW COMPARISON:  03/04/2017. FINDINGS: Endotracheal tube tip noted 2 cm above the scratched it endotracheal tube and NG tube in stable position. Cardiomegaly. Mild bilateral interstitial prominence. Mild CHF cannot be excluded. No prominent pleural effusion. No pneumothorax . IMPRESSION: 1.  Lines and tubes in stable position. 2. Cardiomegaly with mild pulmonary interstitial prominence. Mild CHF cannot be completely excluded . Electronically Signed   By: Marcello Moores  Register   On: 03/04/2017 10:56   STUDIES:  CTA 6/18: Ruptured intracranial aneurysm with hemorrhage into the left inferior frontal gyrus and then into the ventricular system via of the left lateral ventricle. 2. Positive for active extravasation of hemorrhage from a superiorly directed left supraclinoid ICA aneurysm (~5 x 3 mm).4. 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 5. Mild ventriculomegaly/transependymal edema. Absence subarachnoid hemorrhage at this time, and basilar cisterns remain patent. 6. Tortuous carotid arteries. No atherosclerosis or stenosis  CULTURES: Sputum 6/19 >>>  ANTIBIOTICS: None.   SIGNIFICANT EVENTS: 6/18 > Presented to ED with [facility]  LINES/TUBES: oett 6/18>>>  DISCUSSION: 45 year old female admitted 6/18 with SAH w/ IVH. IVC placed in ED. Aneurysm coiled in IR. To ICU on vent.   ASSESSMENT / PLAN:  PULMONARY A: Acute hypercarbic respiratory failure Ventilator dependent in setting of acute neurological insult - improved    Concern for aspiration  P:   Full vent support Wean > weaning now, hope to extubate this AM  Trend CXR VAP Bundle  Pulmonary Hygiene   CARDIOVASCULAR A:  H/O HTN  Elevated Troponin > down-trending  P:  Cardiac Monitoring  SBP goal < 140 in setting of SAH Phenylephrine/Nicardipine if needed  Holding home Maxide Trend Troponin   RENAL A:   AKI - improving  Hypokalemia P:   Trend BMP Replace electrolytes as needed  1/2 NS @ 75 ml/hr   GASTROINTESTINAL A:   No acute  P:   NPO > if not extubated today with start TF  PPI Swallow Evaluation once extubated > cortrack if fails   HEMATOLOGIC A:   No acute  P:  SCDs Trend CBC  INFECTIOUS A:   ? Aspiration Event  P:   Trend WBC and Fever Curve Follow culture data  Hold antibiotics for now   ENDOCRINE A:   Hyperglycemia  P:   Trend Glucose SSI for CBG >180   NEUROLOGIC A:   SAH w/ IVH s/p IVC placed in  ED 6/18. Aneurysm coiled 6/18. P:   RASS goal: -1 Per Neuro/Neurosurgery  Continue Keppra and Nimodipine  FAMILY  - Updates: no family available  - Inter-disciplinary family meet or Palliative Care meeting due by:  6/25  CC Time: 29 minutes   Hayden Pedro, AGACNP-BC [place]  Pgr: [phone]  PCCM Pgr: [phone]

## 2017-03-05 NOTE — Progress Notes (Signed)
eLink Physician-Brief Progress Note Patient Name: Carly [name] DOB: 1971/07/18 MRN: [id number]   Date of Service  03/05/2017  HPI/Events of Note  hypotensive  eICU Interventions  NS 500 bolus Start neo gtt     Intervention Category Intermediate Interventions: Hypotension - evaluation and management  ALVA,RAKESH V. 03/05/2017, 10:58 PM

## 2017-03-05 NOTE — Progress Notes (Signed)
PT Cancellation Note  Patient Details Name: Carly [name] MRN: [id number] DOB: [birth year]   Cancelled Treatment:    Reason Eval/Treat Not Completed: Patient not medically ready. Pt remains sedated/intubated. Will continue to follow and complete PT eval when medically appropriate.    [name] 03/05/2017, 10:53 AM   [name], PT, DPT Acute Rehabilitation Services Pager: [phone]

## 2017-03-06 ENCOUNTER — Inpatient Hospital Stay (HOSPITAL_COMMUNITY): Payer: 59

## 2017-03-06 ENCOUNTER — Encounter (HOSPITAL_COMMUNITY): Payer: Self-pay

## 2017-03-06 DIAGNOSIS — I609 Nontraumatic subarachnoid hemorrhage, unspecified: Secondary | ICD-10-CM

## 2017-03-06 LAB — BASIC METABOLIC PANEL
ANION GAP: 11 (ref 5–15)
BUN: <5 mg/dL — ABNORMAL LOW (ref 6–20)
CALCIUM: 8.3 mg/dL — AB (ref 8.9–10.3)
CO2: 24 mmol/L (ref 22–32)
Chloride: 109 mmol/L (ref 101–111)
Creatinine, Ser: 0.94 mg/dL (ref 0.44–1.00)
GLUCOSE: 108 mg/dL — AB (ref 65–99)
POTASSIUM: 3.2 mmol/L — AB (ref 3.5–5.1)
Sodium: 144 mmol/L (ref 135–145)

## 2017-03-06 LAB — GLUCOSE, CAPILLARY
GLUCOSE-CAPILLARY: 104 mg/dL — AB (ref 65–99)
GLUCOSE-CAPILLARY: 126 mg/dL — AB (ref 65–99)
GLUCOSE-CAPILLARY: 82 mg/dL (ref 65–99)
Glucose-Capillary: 120 mg/dL — ABNORMAL HIGH (ref 65–99)
Glucose-Capillary: 143 mg/dL — ABNORMAL HIGH (ref 65–99)
Glucose-Capillary: 82 mg/dL (ref 65–99)

## 2017-03-06 LAB — CBC
HEMATOCRIT: 34.6 % — AB (ref 36.0–46.0)
HEMOGLOBIN: 11 g/dL — AB (ref 12.0–15.0)
MCH: 27 pg (ref 26.0–34.0)
MCHC: 31.8 g/dL (ref 30.0–36.0)
MCV: 85 fL (ref 78.0–100.0)
Platelets: 192 10*3/uL (ref 150–400)
RBC: 4.07 MIL/uL (ref 3.87–5.11)
RDW: 15.1 % (ref 11.5–15.5)
WBC: 14.9 10*3/uL — ABNORMAL HIGH (ref 4.0–10.5)

## 2017-03-06 LAB — PHOSPHORUS
PHOSPHORUS: 1.8 mg/dL — AB (ref 2.5–4.6)
PHOSPHORUS: 1.9 mg/dL — AB (ref 2.5–4.6)
Phosphorus: <1 mg/dL — CL (ref 2.5–4.6)

## 2017-03-06 LAB — PROCALCITONIN: Procalcitonin: <0.1 ng/mL

## 2017-03-06 LAB — MAGNESIUM
MAGNESIUM: 2.2 mg/dL (ref 1.7–2.4)
Magnesium: 2.2 mg/dL (ref 1.7–2.4)
Magnesium: 1.8 mg/dL (ref 1.7–2.4)

## 2017-03-06 MED ORDER — VITAL HIGH PROTEIN PO LIQD
1000.0000 mL | ORAL | Status: DC
  Administered 2017-03-06 – 2017-03-08 (×3): 1000 mL

## 2017-03-06 MED ORDER — POTASSIUM PHOSPHATES 15 MMOLE/5ML IV SOLN: 40.0000 meq | Freq: Once | INTRAVENOUS | Status: DC

## 2017-03-06 MED ORDER — SODIUM CHLORIDE 0.9% FLUSH
10.0000 mL | Freq: Two times a day (BID) | INTRAVENOUS | Status: DC
  Administered 2017-03-06: 20 mL
  Administered 2017-03-06 – 2017-03-07 (×3): 10 mL
  Administered 2017-03-08: 30 mL

## 2017-03-06 MED ORDER — POTASSIUM PHOSPHATES 15 MMOLE/5ML IV SOLN
20.0000 mmol | Freq: Once | INTRAVENOUS | Status: AC
  Administered 2017-03-06: 20 mmol via INTRAVENOUS
  Filled 2017-03-06: qty 6.67

## 2017-03-06 MED ORDER — SODIUM CHLORIDE 0.9% FLUSH: 10.0000 mL | INTRAVENOUS | Status: DC | PRN

## 2017-03-06 MED ORDER — PRO-STAT SUGAR FREE PO LIQD
30.0000 mL | Freq: Two times a day (BID) | ORAL | Status: DC
  Filled 2017-03-06: qty 30

## 2017-03-06 MED ORDER — CHLORHEXIDINE GLUCONATE CLOTH 2 % EX PADS
6.0000 | MEDICATED_PAD | Freq: Every day | CUTANEOUS | Status: DC
  Administered 2017-03-07 – 2017-03-09 (×3): 6 via TOPICAL

## 2017-03-06 MED ORDER — CHLORHEXIDINE GLUCONATE CLOTH 2 % EX PADS
6.0000 | MEDICATED_PAD | Freq: Every day | CUTANEOUS | Status: DC
  Administered 2017-03-07 – 2017-03-16 (×8): 6 via TOPICAL

## 2017-03-06 MED ORDER — VITAL HIGH PROTEIN PO LIQD
1000.0000 mL | ORAL | Status: DC
  Administered 2017-03-06: 1000 mL

## 2017-03-06 MED ORDER — SODIUM CHLORIDE 0.9 % IV SOLN
INTRAVENOUS | Status: DC
  Administered 2017-03-06 – 2017-03-07 (×3): via INTRAVENOUS

## 2017-03-06 MED ORDER — POTASSIUM & SODIUM PHOSPHATES 280-160-250 MG PO PACK
2.0000 | PACK | Freq: Three times a day (TID) | ORAL | Status: AC
  Administered 2017-03-06 – 2017-03-07 (×3): 2 via ORAL
  Filled 2017-03-06 (×3): qty 2

## 2017-03-06 MED ORDER — SODIUM CHLORIDE 0.9 % IV SOLN
0.0000 ug/min | INTRAVENOUS | Status: DC
  Administered 2017-03-06: 200 ug/min via INTRAVENOUS
  Administered 2017-03-06: 100 ug/min via INTRAVENOUS
  Administered 2017-03-07 (×6): 400 ug/min via INTRAVENOUS
  Administered 2017-03-07: 200 ug/min via INTRAVENOUS
  Administered 2017-03-08 (×3): 400 ug/min via INTRAVENOUS
  Filled 2017-03-06 (×19): qty 4

## 2017-03-06 MED ORDER — NOREPINEPHRINE BITARTRATE 1 MG/ML IV SOLN
0.0000 ug/min | INTRAVENOUS | Status: DC
  Administered 2017-03-06: 2 ug/min via INTRAVENOUS
  Administered 2017-03-07: 10 ug/min via INTRAVENOUS
  Administered 2017-03-07: 5 ug/min via INTRAVENOUS
  Administered 2017-03-07: 10 ug/min via INTRAVENOUS
  Filled 2017-03-06 (×4): qty 4

## 2017-03-06 MED ORDER — ADULT MULTIVITAMIN LIQUID CH
15.0000 mL | Freq: Every day | ORAL | Status: DC
  Administered 2017-03-06 – 2017-03-24 (×18): 15 mL
  Filled 2017-03-06 (×19): qty 15

## 2017-03-06 MED ORDER — SODIUM CHLORIDE 0.9% FLUSH
10.0000 mL | Freq: Two times a day (BID) | INTRAVENOUS | Status: DC
  Administered 2017-03-06 – 2017-03-08 (×5): 10 mL

## 2017-03-06 MED ORDER — FENTANYL CITRATE (PF) 100 MCG/2ML IJ SOLN
25.0000 ug | INTRAMUSCULAR | Status: DC | PRN
  Administered 2017-03-07 (×5): 100 ug via INTRAVENOUS
  Filled 2017-03-06 (×5): qty 2

## 2017-03-06 MED ORDER — VITAL HIGH PROTEIN PO LIQD: 1000.0000 mL | ORAL | Status: DC

## 2017-03-06 NOTE — Progress Notes (Signed)
OT Cancellation Note  Patient Details Name: Carly [name] MRN: [id number] DOB: 01-11-1971   Cancelled Treatment:    Reason Eval/Treat Not Completed: Patient not medically ready  [place], OT/L  [phone] 03/06/2017 03/06/2017, 7:11 AM

## 2017-03-06 NOTE — Progress Notes (Signed)
PT Cancellation Note  Patient Details Name: Carly [name] MRN: [id number] DOB: 08/28/1971   Cancelled Treatment:    Reason Eval/Treat Not Completed: Patient not medically ready. Will continue to follow and complete PT eval when appropriate.   [name] 03/06/2017, 8:55 AM   [name], PT, DPT Acute Rehabilitation Services Pager: [phone]

## 2017-03-06 NOTE — Progress Notes (Signed)
eLink Physician-Brief Progress Note Patient Name: MIRCA YALE DOB: [birth year] MRN: [id number]   Date of Service  03/06/2017  HPI/Events of Note  SAH with vasospasm ON neo gtt with SBP 130s, goal 180 per neuro sx  eICU Interventions  Add levo gtt     Intervention Category Major Interventions: Hypertension - evaluation and management  Tayanna Talford V. 03/06/2017, 7:32 PM

## 2017-03-06 NOTE — Progress Notes (Signed)
CRITICAL VALUE ALERT  Critical Value:  Phosphorus: <1.0    Date & Time Notied:  03/06/17 1915  Provider Notified: CCM Elink  Orders Received/Actions taken: [place]

## 2017-03-06 NOTE — Progress Notes (Signed)
Assessed both arms with ultrasound for PIV.  Unable to find any suitable veins.

## 2017-03-06 NOTE — Progress Notes (Signed)
PULMONARY / CRITICAL CARE MEDICINE   Name: Carly [name] MRN: [id number] DOB: 10-Sep-1971    ADMISSION DATE:  03/03/2017 CONSULTATION DATE:  6/18  REFERRING MD:  Dr. Ralene Bathe EDP  CHIEF COMPLAINT: Headache  Brief:   46 year old female with PMH as below, which is significant for HTN, GERD, and migraines admitted 6/18 for HA & focal seizure activity. CT head SAH w/ associated IVH in setting of presumed ruptured aneurysm. Developed progressive decreased LOC, vomited, postured. Intubated for airway protection and IVC placed per neuro-surg.   SUBJECTIVE:  No issues overnight. Neuro exam continues to be inconsistent. Weaned well 6/20.   VITAL SIGNS: BP 133/71   Pulse (!) 57   Temp 98.5 F (36.9 C) (Oral)   Resp 18   Ht 5\' 7"  (1.702 m)   Wt 108 kg (238 lb 1.6 oz)   LMP 02/05/2017   SpO2 100%   BMI 37.29 kg/m   HEMODYNAMICS:    VENTILATOR SETTINGS: Vent Mode: PSV;CPAP FiO2 (%):  [40 %] 40 % Set Rate:  [18 bmp] 18 bmp Vt Set:  [400 mL] 400 mL PEEP:  [5 cmH20] 5 cmH20 Pressure Support:  [5 cmH20] 5 cmH20 Plateau Pressure:  [15 cmH20-16 cmH20] 16 cmH20  INTAKE / OUTPUT: I/O last 3 completed shifts: In: 2680 [I.V.:2400; NG/GT:80; IV Piggyback:200] Out: 3295 [Urine:2745; Emesis/NG output:200; Drains:300]  PHYSICAL EXAMINATION:  General appearance: Adult female, no acute distress  Eyes:  PERRL, EOMI bilaterally. Mouth:  membranes and no mucosal ulcerations Neck: Trachea midline; neck supple, no JVD, ETT in place   Lungs/chest: CTA, with normal respiratory effort, no crackles/wheeze CV: RRR, no MRGs  Abdomen: Soft, non-tender; no masses or HSM Extremities: No peripheral edema  Skin: warm, dry, intact, IVC in place (Draining bloody CSF)    Psych: not following commands, opens eyes to physical stimulation     LABS:  BMET  Recent Labs Lab 03/04/17 0426 03/05/17 0230 03/06/17 0447  NA 145 144 144  K 4.0 3.4* 3.2*  CL 116* 113* 109  CO2 23 23 24   BUN 5* 5* <5*   CREATININE 1.01* 0.95 0.94  GLUCOSE 143* 128* 108*    Electrolytes  Recent Labs Lab 03/04/17 0426 03/05/17 0230 03/06/17 0447  CALCIUM 7.5* 8.1* 8.3*  MG 1.7  --  2.2  PHOS 1.9*  --  1.8*    CBC  Recent Labs Lab 03/04/17 0639 03/05/17 0230 03/06/17 0447  WBC 12.4* 12.2* 14.9*  HGB 11.1* 10.5* 11.0*  HCT 34.5* 33.0* 34.6*  PLT 168 159 192    Coag's  Recent Labs Lab 03/03/17 2041  APTT 31  INR 1.07    Sepsis Markers No results for input(s): LATICACIDVEN, PROCALCITON, O2SATVEN in the last 168 hours.  ABG  Recent Labs Lab 03/03/17 1420 03/04/17 0440  PHART 7.265* 7.413  PCO2ART 55.7* 33.2  PO2ART 150.0* 191*    Liver Enzymes  Recent Labs Lab 03/03/17 1214  AST 45*  ALT 44  ALKPHOS 77  BILITOT 0.7  ALBUMIN 3.9    Cardiac Enzymes  Recent Labs Lab 03/04/17 1202 03/05/17 0230  TROPONINI 0.13* 0.07*    Glucose  Recent Labs Lab 03/05/17 2027 03/05/17 2103 03/05/17 2202 03/05/17 2321 03/06/17 0333 03/06/17 0754  GLUCAP 69 64* 70 95 104* 126*    Imaging Ct Head Wo Contrast  Result Date: 03/06/2017 CLINICAL DATA:  Initial evaluation for decreased level of consciousness. Level of consciousness. EXAM: CT HEAD WITHOUT CONTRAST TECHNIQUE: Contiguous axial images were obtained from  the base of the skull through the vertex without intravenous contrast. COMPARISON:  Prior CT from 03/03/2017. FINDINGS: Brain: Acute intra-axial hemorrhage at the anterior inferior left frontal lobe again seen, relatively stable measuring 4.1 x 2.2 cm. Similar surrounding low-density vasogenic edema. Intraventricular extension with blood in the lateral, third, and fourth ventricles, similar to previous. There has been interval placement of a right frontal approach ventricular catheter with tip seen terminating at the left thalamus. Ventricular dilatation relatively similar, with asymmetric dilatation of the left lateral ventricle as compared to the right. Basilar  cisterns remain patent. No significant midline shift. No new hemorrhage. Streak artifact from coiled aneurysm noted. No acute large vessel territory infarct. No extra-axial fluid collection. Vascular: No hyperdense vessel. Skull: Scalp soft tissues within normal limits. Right frontal burr hole for ventricular catheter noted. Calvarium otherwise intact. Sinuses/Orbits: Globes and orbital soft tissues within normal limits. Paranasal sinuses clear. Endotracheal and enteric tubes partially visualized. Trace left mastoid effusion. IMPRESSION: 1. Overall little interval change in size and appearance of acute intraparenchymal hematoma at the anterior inferior left frontal lobe from ruptured left ICA aneurysm, with similar localized mass effect and edema. Associated intraventricular extension with large volume hemorrhage throughout the ventricular system. 2. Interval placement of right frontal approach ventricular catheter with tip terminating at the left thalamus. Ventricular size similar to previous. 3. No other new acute intracranial process. Electronically Signed   By: Jeannine Boga M.D.   On: 03/06/2017 00:18   Dg Chest Port 1 View  Result Date: 03/06/2017 CLINICAL DATA:  Intubation. EXAM: PORTABLE CHEST 1 VIEW COMPARISON:  03/05/2017. FINDINGS: Interim placement of feeding tube, its tip is below left hemidiaphragm. Endotracheal tube in stable position. Heart size normal. Low lung volumes with right base subsegmental atelectasis, improved from prior exam. Mild infiltrate left lung base cannot be excluded. No pleural effusion or pneumothorax. IMPRESSION: 1. Interim placement of feeding tube, its tip is below left hemidiaphragm. Endotracheal tube in stable position. 2. Right base subsegmental atelectasis, improved from prior exam. Mild left base infiltrate cannot be excluded on today's exam . Electronically Signed   By: Marcello Moores  Register   On: 03/06/2017 06:49   STUDIES:  CTA 6/18: Ruptured intracranial  aneurysm with hemorrhage into the left inferior frontal gyrus and then into the ventricular system via of the left lateral ventricle. 2. Positive for active extravasation of hemorrhage from a superiorly directed left supraclinoid ICA aneurysm (~5 x 3 mm).4. 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 5. Mild ventriculomegaly/transependymal edema. Absence subarachnoid hemorrhage at this time, and basilar cisterns remain patent. 6. Tortuous carotid arteries. No atherosclerosis or stenosis CT Head 6/20: Acute intra-axial hemorrhage at the anterior inferior left frontal lobe again seen, relatively stable measuring 4.1 x 2.2 cm. Similar surrounding low-density vasogenic edema. Intraventricular extension with blood in the lateral, third, and fourth ventricles, similar to previous. There has been interval placement of a right frontal approach ventricular catheter with tip seen terminating at the left thalamus. Ventricular dilatation relatively similar, with asymmetric dilatation of the left lateral ventricle as compared to the right. Basilar cisterns remain patent. No significant midline shift. No new hemorrhage. Streak artifact from coiled aneurysm noted. No acute large vessel territory infarct. No extra-axial fluid collection.  CULTURES: Sputum 6/19 >>>  ANTIBIOTICS: None.   SIGNIFICANT EVENTS: 6/18 > Presented to ED with [facility]  LINES/TUBES: oett 6/18>>>  DISCUSSION: 46 year old female admitted 6/18 with SAH w/ IVH. IVC placed in ED. Aneurysm coiled in  IR. To ICU on vent.   ASSESSMENT / PLAN:  PULMONARY A: Acute hypercarbic respiratory failure Ventilator dependent in setting of acute neurological insult - improved   Concern for aspiration  P:   Full vent support Wean > weaning now, mental status barrier to extubation  Trend CXR VAP Bundle  Pulmonary Hygiene   CARDIOVASCULAR A:  HTN  Elevated Troponin > down-trending  P:  Cardiac Monitoring  SBP  goal 120-140 in setting of SAH Phenylephrine/Nicardipine if needed  Holding home Maxide  RENAL A:   AKI - improving  Hypokalemia P:   Trend BMP Replace electrolytes as needed  1/2 NS @ 75 ml/hr   GASTROINTESTINAL A:   Dysphagia  Nutrition Needs  P:   Continue TF  PPI  HEMATOLOGIC A:   No acute  P:  SCDs Trend CBC Hold all anticoagulation   INFECTIOUS A:   ? Aspiration Event  P:   Trend WBC and Fever Curve Follow culture data  Hold antibiotics for now   ENDOCRINE A:   Hyperglycemia  P:   Trend Glucose SSI for CBG >180   NEUROLOGIC A:   SAH w/ IVH s/p IVC placed in ED 6/18. Aneurysm coiled 6/18. P:   RASS goal: 0  Per Neuro/Neurosurgery  Continue Keppra and Nimodipine  FAMILY  - Updates: no family available  - Inter-disciplinary family meet or Palliative Care meeting due by:  6/25  CC Time: 10 minutes   Hayden Pedro, AGACNP-BC [place]  Pgr: [phone]  PCCM Pgr: [phone]

## 2017-03-06 NOTE — Progress Notes (Signed)
eLink Physician-Brief Progress Note Patient Name: ROBERTO HLAVATY DOB: May 14, 1971 MRN: [id number]   Date of Service  03/06/2017  HPI/Events of Note  Hypophos ? refeeding  eICU Interventions  -repleted -drop Tfs to 30/h      Intervention Category Intermediate Interventions: Electrolyte abnormality - evaluation and management  ALVA,RAKESH V. 03/06/2017, 7:25 PM

## 2017-03-06 NOTE — Progress Notes (Signed)
Peripherally Inserted Central Catheter/Midline Placement  The IV Nurse has discussed with the patient and/or persons authorized to consent for the patient, the purpose of this procedure and the potential benefits and risks involved with this procedure.  The benefits include less needle sticks, lab draws from the catheter, and the patient may be discharged home with the catheter. Risks include, but not limited to, infection, bleeding, blood clot (thrombus formation), and puncture of an artery; nerve damage and irregular heartbeat and possibility to perform a PICC exchange if needed/ordered by physician.  Alternatives to this procedure were also discussed.  Bard Power PICC patient education guide, fact sheet on infection prevention and patient information card has been provided to patient /or left at bedside.    PICC/Midline Placement Documentation  PICC Double Lumen 82/08/13 PICC Right Basilic 41 cm (Active)  Indication for Insertion or Continuance of Line Poor Vasculature-patient has had multiple peripheral attempts or PIVs lasting less than 24 hours 03/06/2017  2:00 PM  Dressing Change Due 03/13/17 03/06/2017  2:00 PM       Jule Economy Horton 03/06/2017, 2:24 PM

## 2017-03-06 NOTE — Progress Notes (Signed)
Initial Nutrition Assessment  DOCUMENTATION CODES:   Obesity unspecified  INTERVENTION:   Vital High Protein @ 60 ml/hr via Cortrak Provides: 1440 kcal, 126 grams protein, and 1203 ml free water.   MVI daily  Noted low phosphorus, labs ordered, MD monitoring and providing repletion.   NUTRITION DIAGNOSIS:   Inadequate oral intake related to inability to eat as evidenced by NPO status.  GOAL:   Patient will meet greater than or equal to 90% of their needs  MONITOR:   Vent status, I & O's, TF tolerance, Labs  REASON FOR ASSESSMENT:   Consult, Ventilator Enteral/tube feeding initiation and management  ASSESSMENT:   Pt with PMH of HTN, GERD, and migraines admitted with SAH, IVH, ruptured aneurysm s/p coiling of left ICA aneurysm and IVC placed 6/18.   Patient is currently intubated on ventilator support Pt discussed during ICU rounds and with RN.  No sedation.  Nutrition-Focused physical exam completed. No acute findings.   Labs reviewed: PO4 1.8 - 1.9 (L), K+ 3.2 (L) Medications reviewed and include: 20 mmol potassium phosphate ordered x 1   CBG's: 126-82  6/20 cortrak placed  Diet Order:  Diet NPO time specified  Skin:  Reviewed, no issues  Last BM:  unknown  Height:   Ht Readings from Last 1 Encounters:  03/03/17 5\' 7"  (1.702 m)    Weight:   Wt Readings from Last 1 Encounters:  03/03/17 238 lb 1.6 oz (108 kg)    Ideal Body Weight:  61.3 kg  BMI:  Body mass index is 37.29 kg/m.  Estimated Nutritional Needs:   Kcal:  6378-5885  Protein:  >/= 122 grams  Fluid:  > 2 L/day  EDUCATION NEEDS:   No education needs identified at this time  [place], [place], [place] Pager [phone] After Hours Pager

## 2017-03-06 NOTE — Progress Notes (Signed)
Subjective: Called by patient's nurse, Merrilee Seashore, because he found her less responsive. Dr. Elsworth Soho from critical care ordered normal saline bolus of 500 mL. Patient was on the way to CT scan, for follow-up CT.  Reviewing the electronic record systolic blood pressure was as low as 85 mmHg. IVC draining bloody CSF.  Objective: Vital signs in last 24 hours: Vitals:   03/05/17 2100 03/05/17 2200 03/05/17 2300 03/05/17 2351  BP: (!) 95/50 (!) 99/52 (!) 97/41   Pulse:  (!) 56 62   Resp: 20 (!) 23 18   Temp:      TempSrc:      SpO2:  100% 100% 100%  Weight:      Height:        Intake/Output from previous day: 06/20 0701 - 06/21 0700 In: 1405 [I.V.:1125; NG/GT:80; IV Piggyback:200] Out: 0370 [Urine:1525; Drains:143] Intake/Output this shift: Total I/O In: 305 [I.V.:225; NG/GT:80] Out: 168 [Urine:125; Drains:43]  Physical Exam:  Patient continues on ventilator via ETT. Opening eyes spontaneously. Following commands by squeezing right hand as well as looking to the left and right to command.  CBC  Recent Labs  03/04/17 0639 03/05/17 0230  WBC 12.4* 12.2*  HGB 11.1* 10.5*  HCT 34.5* 33.0*  PLT 168 159   BMET  Recent Labs  03/04/17 0426 03/05/17 0230  NA 145 144  K 4.0 3.4*  CL 116* 113*  CO2 23 23  GLUCOSE 143* 128*  BUN 5* 5*  CREATININE 1.01* 0.95  CALCIUM 7.5* 8.1*    Assessment/Plan: CT of brain without contrast reviewed. No significant change to ICH and IVH. IVC in adequate position. Patient at this time essentially at exam of Dr. Cyndy Freeze from 6/19.  I suspect lower systolic blood pressure less than to decreased neurologic responsiveness, which is improved with increase of blood pressure.   Spoke with patient's husband who is at bedside. Spoke with patient's nurse, and emphasized that he will need to work with CCM to maintain adequate blood pressure. The goal would be systolic blood pressure of 120-140 mmHg.  Prognosis remains guarded.   Hosie Spangle,  MD 03/06/2017, 12:15 AM

## 2017-03-07 ENCOUNTER — Inpatient Hospital Stay (HOSPITAL_COMMUNITY): Payer: 59

## 2017-03-07 DIAGNOSIS — I609 Nontraumatic subarachnoid hemorrhage, unspecified: Secondary | ICD-10-CM

## 2017-03-07 DIAGNOSIS — I360 Nonrheumatic tricuspid (valve) stenosis: Secondary | ICD-10-CM

## 2017-03-07 DIAGNOSIS — J9601 Acute respiratory failure with hypoxia: Secondary | ICD-10-CM

## 2017-03-07 DIAGNOSIS — N179 Acute kidney failure, unspecified: Secondary | ICD-10-CM

## 2017-03-07 LAB — BODY FLUID CELL COUNT WITH DIFFERENTIAL
Eos, Fluid: 0 %
Lymphs, Fluid: 2 %
Monocyte-Macrophage-Serous Fluid: 9 % — ABNORMAL LOW (ref 50–90)
NEUTROPHIL FLUID: 89 % — AB (ref 0–25)
WBC FLUID: 4500 uL — AB (ref 0–1000)

## 2017-03-07 LAB — GLUCOSE, CAPILLARY
Glucose-Capillary: 128 mg/dL — ABNORMAL HIGH (ref 65–99)
Glucose-Capillary: 144 mg/dL — ABNORMAL HIGH (ref 65–99)
Glucose-Capillary: 148 mg/dL — ABNORMAL HIGH (ref 65–99)
Glucose-Capillary: 148 mg/dL — ABNORMAL HIGH (ref 65–99)
Glucose-Capillary: 152 mg/dL — ABNORMAL HIGH (ref 65–99)
Glucose-Capillary: 173 mg/dL — ABNORMAL HIGH (ref 65–99)

## 2017-03-07 LAB — POCT I-STAT 3, ART BLOOD GAS (G3+)
Acid-base deficit: 3 mmol/L — ABNORMAL HIGH (ref 0.0–2.0)
Bicarbonate: 23.6 mmol/L (ref 20.0–28.0)
Bicarbonate: 21.3 mmol/L (ref 20.0–28.0)
O2 SAT: 83 %
O2 Saturation: 94 %
PCO2 ART: 38.5 mmHg (ref 32.0–48.0)
PCO2 ART: 37.2 mmHg (ref 32.0–48.0)
PO2 ART: 52 mmHg — AB (ref 83.0–108.0)
Patient temperature: 104.1
Patient temperature: 100.5
TCO2: 25 mmol/L (ref 0–100)
TCO2: 22 mmol/L (ref 0–100)
pH, Arterial: 7.408 (ref 7.350–7.450)
pH, Arterial: 7.37 (ref 7.350–7.450)
pO2, Arterial: 80 mmHg — ABNORMAL LOW (ref 83.0–108.0)

## 2017-03-07 LAB — BASIC METABOLIC PANEL
Anion gap: 11 (ref 5–15)
BUN: 8 mg/dL (ref 6–20)
CALCIUM: 7.8 mg/dL — AB (ref 8.9–10.3)
CHLORIDE: 114 mmol/L — AB (ref 101–111)
CO2: 22 mmol/L (ref 22–32)
Creatinine, Ser: 1.04 mg/dL — ABNORMAL HIGH (ref 0.44–1.00)
GFR calc non Af Amer: >60 mL/min (ref >60)
Glucose, Bld: 117 mg/dL — ABNORMAL HIGH (ref 65–99)
Potassium: 3.4 mmol/L — ABNORMAL LOW (ref 3.5–5.1)
SODIUM: 147 mmol/L — AB (ref 135–145)

## 2017-03-07 LAB — CBC
HCT: 34.5 % — ABNORMAL LOW (ref 36.0–46.0)
HEMOGLOBIN: 11.2 g/dL — AB (ref 12.0–15.0)
MCH: 27.1 pg (ref 26.0–34.0)
MCHC: 32.5 g/dL (ref 30.0–36.0)
MCV: 83.5 fL (ref 78.0–100.0)
Platelets: 209 10*3/uL (ref 150–400)
RBC: 4.13 MIL/uL (ref 3.87–5.11)
RDW: 14.7 % (ref 11.5–15.5)
WBC: 15 10*3/uL — ABNORMAL HIGH (ref 4.0–10.5)

## 2017-03-07 LAB — TROPONIN I: TROPONIN I: 0.36 ng/mL — AB (ref <0.03)

## 2017-03-07 LAB — URINALYSIS, ROUTINE W REFLEX MICROSCOPIC
BILIRUBIN URINE: NEGATIVE
GLUCOSE, UA: NEGATIVE mg/dL
KETONES UR: NEGATIVE mg/dL
Leukocytes, UA: NEGATIVE
NITRITE: NEGATIVE
PROTEIN: NEGATIVE mg/dL
Specific Gravity, Urine: 1.016 (ref 1.005–1.030)
pH: 5 (ref 5.0–8.0)

## 2017-03-07 LAB — PROCALCITONIN: Procalcitonin: 1.28 ng/mL

## 2017-03-07 LAB — MAGNESIUM
MAGNESIUM: 2 mg/dL (ref 1.7–2.4)
MAGNESIUM: 1.8 mg/dL (ref 1.7–2.4)

## 2017-03-07 LAB — ECHOCARDIOGRAM COMPLETE
Height: 67 in
WEIGHTICAEL: 3876.57 [oz_av]

## 2017-03-07 LAB — PHOSPHORUS
PHOSPHORUS: 2.1 mg/dL — AB (ref 2.5–4.6)
Phosphorus: 1.7 mg/dL — ABNORMAL LOW (ref 2.5–4.6)

## 2017-03-07 LAB — TRIGLYCERIDES: Triglycerides: 957 mg/dL — ABNORMAL HIGH (ref <150)

## 2017-03-07 MED ORDER — FENTANYL BOLUS VIA INFUSION
50.0000 ug | INTRAVENOUS | Status: DC | PRN
  Administered 2017-03-07 – 2017-03-08 (×5): 50 ug via INTRAVENOUS
  Filled 2017-03-07: qty 50

## 2017-03-07 MED ORDER — NOREPINEPHRINE BITARTRATE 1 MG/ML IV SOLN
0.0000 ug/min | INTRAVENOUS | Status: DC
  Administered 2017-03-07: 40 ug/min via INTRAVENOUS
  Administered 2017-03-07: 30 ug/min via INTRAVENOUS
  Administered 2017-03-08 (×2): 45 ug/min via INTRAVENOUS
  Administered 2017-03-08 – 2017-03-09 (×5): 50 ug/min via INTRAVENOUS
  Administered 2017-03-10: 20 ug/min via INTRAVENOUS
  Administered 2017-03-10 – 2017-03-11 (×2): 50 ug/min via INTRAVENOUS
  Administered 2017-03-12: 29 ug/min via INTRAVENOUS
  Administered 2017-03-12: 45 ug/min via INTRAVENOUS
  Administered 2017-03-13: 50 ug/min via INTRAVENOUS
  Administered 2017-03-13: 45 ug/min via INTRAVENOUS
  Administered 2017-03-15: 17 ug/min via INTRAVENOUS
  Administered 2017-03-15: 20 ug/min via INTRAVENOUS
  Administered 2017-03-16: 16 ug/min via INTRAVENOUS
  Filled 2017-03-07 (×23): qty 16

## 2017-03-07 MED ORDER — FENTANYL 2500MCG IN NS 250ML (10MCG/ML) PREMIX INFUSION
25.0000 ug/h | INTRAVENOUS | Status: DC
  Administered 2017-03-07: 25 ug/h via INTRAVENOUS
  Administered 2017-03-08: 100 ug/h via INTRAVENOUS
  Filled 2017-03-07 (×2): qty 250

## 2017-03-07 MED ORDER — MIDAZOLAM BOLUS VIA INFUSION
1.0000 mg | INTRAVENOUS | Status: DC | PRN
  Filled 2017-03-07: qty 2

## 2017-03-07 MED ORDER — FUROSEMIDE 10 MG/ML IJ SOLN
40.0000 mg | Freq: Once | INTRAMUSCULAR | Status: AC
  Administered 2017-03-07: 40 mg via INTRAVENOUS
  Filled 2017-03-07: qty 4

## 2017-03-07 MED ORDER — IOPAMIDOL (ISOVUE-370) INJECTION 76%
INTRAVENOUS | Status: AC
  Administered 2017-03-07: 100 mL via INTRAVENOUS
  Filled 2017-03-07: qty 50

## 2017-03-07 MED ORDER — PROPOFOL 1000 MG/100ML IV EMUL
5.0000 ug/kg/min | INTRAVENOUS | Status: DC
  Administered 2017-03-07 (×2): 50 ug/kg/min via INTRAVENOUS
  Filled 2017-03-07 (×2): qty 100

## 2017-03-07 MED ORDER — PIPERACILLIN-TAZOBACTAM 3.375 G IVPB
3.3750 g | Freq: Three times a day (TID) | INTRAVENOUS | Status: DC
  Administered 2017-03-07 – 2017-03-10 (×9): 3.375 g via INTRAVENOUS
  Filled 2017-03-07 (×10): qty 50

## 2017-03-07 MED ORDER — PROPOFOL 1000 MG/100ML IV EMUL
INTRAVENOUS | Status: AC
  Administered 2017-03-07: 50 ug/kg/min via INTRAVENOUS
  Filled 2017-03-07: qty 100

## 2017-03-07 MED ORDER — IOPAMIDOL (ISOVUE-370) INJECTION 76%
INTRAVENOUS | Status: AC
  Filled 2017-03-07: qty 50

## 2017-03-07 MED ORDER — VASOPRESSIN 20 UNIT/ML IV SOLN
0.0300 [IU]/min | INTRAVENOUS | Status: DC
  Administered 2017-03-07 – 2017-03-12 (×7): 0.03 [IU]/min via INTRAVENOUS
  Administered 2017-03-13: 0.02 [IU]/min via INTRAVENOUS
  Filled 2017-03-07 (×8): qty 2

## 2017-03-07 MED ORDER — SODIUM CHLORIDE 0.9 % IV SOLN
0.0000 mg/h | INTRAVENOUS | Status: DC
  Administered 2017-03-07: 9 mg/h via INTRAVENOUS
  Administered 2017-03-07: 2 mg/h via INTRAVENOUS
  Filled 2017-03-07 (×3): qty 10

## 2017-03-07 MED ORDER — MIDAZOLAM BOLUS VIA INFUSION
1.0000 mg | INTRAVENOUS | Status: DC | PRN
  Administered 2017-03-07: 3 mg via INTRAVENOUS
  Administered 2017-03-07: 2 mg via INTRAVENOUS
  Administered 2017-03-07: 3 mg via INTRAVENOUS
  Administered 2017-03-07: 2 mg via INTRAVENOUS
  Administered 2017-03-07 – 2017-03-08 (×3): 3 mg via INTRAVENOUS
  Filled 2017-03-07: qty 4

## 2017-03-07 MED ORDER — FENTANYL CITRATE (PF) 100 MCG/2ML IJ SOLN: 50.0000 ug | Freq: Once | INTRAMUSCULAR | Status: AC

## 2017-03-07 MED ORDER — VANCOMYCIN HCL IN DEXTROSE 750-5 MG/150ML-% IV SOLN
750.0000 mg | Freq: Two times a day (BID) | INTRAVENOUS | Status: DC
  Administered 2017-03-07 – 2017-03-08 (×3): 750 mg via INTRAVENOUS
  Filled 2017-03-07 (×3): qty 150

## 2017-03-07 NOTE — Progress Notes (Signed)
OT Cancellation Note  Patient Details Name: Carly [name] MRN: [id number] DOB: [birth year]   Cancelled Treatment:    Reason Eval/Treat Not Completed: Patient not medically ready. Pt continues with bedrest orders at this time. OT will sign off at this time and will await new orders for OT evaluation if needs change. Thank you!  Norman Herrlich, MS OTR/L  Pager: [phone]   Norman Herrlich 03/07/2017, 7:28 AM

## 2017-03-07 NOTE — Progress Notes (Signed)
Called by RN due to decreased mental status, decreased SpO2, and vent dysychrony. Per RN, pt minimally responsive, pupils sluggish, and minimal motor responses. SBP >190 mmHg with MAP > 100. Temp 104.  Pts mental status could be related to high fever, however certainly also concern for spasm, despite hypertension. - Will cont cooling blanket, tylenol, motrin. Send blood, urine, sputum culture. CXR. - Propofol  - Once stable from respiratory standpoint, will get CT/CTA

## 2017-03-07 NOTE — Progress Notes (Signed)
Pt seen and examined. Had episode of hypotension last night, change in mental status. CT head done.  EXAM: Temp:  [100 F (37.8 C)-103.8 F (39.9 C)] 103.8 F (39.9 C) (06/22 0741) Pulse Rate:  [48-103] 103 (06/22 0730) Resp:  [14-37] 37 (06/22 0730) BP: (105-189)/(51-88) 188/73 (06/22 0730) SpO2:  [77 %-100 %] 88 % (06/22 0730) FiO2 (%):  [40 %-60 %] 60 % (06/22 0421) Weight:  [109.9 kg (242 lb 4.6 oz)] 109.9 kg (242 lb 4.6 oz) (06/22 0500) Intake/Output      06/21 0701 - 06/22 0700 06/22 0701 - 06/23 0700   I.V. (mL/kg) 4298.2 (39.1)    NG/GT 696.5    IV Piggyback 506.7    Total Intake(mL/kg) 5501.4 (50.1)    Urine (mL/kg/hr) 1925 (0.7)    Drains 337 (0.1)    Stool 0 (0)    Total Output 2262     Net +3239.4          Urine Occurrence 8 x    Stool Occurrence 4 x     Opens eyes to pain Pupils reactive Breathing over vent ?intermittently FC RUE Minimal movement LUE/BLE  LABS: Lab Results  Component Value Date   CREATININE 1.04 (H) 03/07/2017   BUN 8 03/07/2017   NA 147 (H) 03/07/2017   K 3.4 (L) 03/07/2017   CL 114 (H) 03/07/2017   CO2 22 03/07/2017   Lab Results  Component Value Date   WBC 15.0 (H) 03/07/2017   HGB 11.2 (L) 03/07/2017   HCT 34.5 (L) 03/07/2017   MCV 83.5 03/07/2017   PLT 209 03/07/2017    IMAGING: CT head reviewed, no new hemorrhage, no HCP, EVD in reasonable position.  IMPRESSION: - 46 y.o. female [place] d# 4 s/p LICA aneurysm coiling, decreased mental status possibly related to hypotension.  PLAN: - Currently on Neosynephrine, may need to titrate up for goal SBP > 159mmHg - Cont supportive care

## 2017-03-07 NOTE — Progress Notes (Signed)
PULMONARY / CRITICAL CARE MEDICINE   Name: Carly [name] MRN: [id number] DOB: 1971/02/02    ADMISSION DATE:  03/03/2017 CONSULTATION DATE:  6/18  REFERRING MD:  Dr. Ralene Bathe EDP  CHIEF COMPLAINT: Headache  Brief:   46 year old female with PMH as below, which is significant for HTN, GERD, and migraines admitted 6/18 for HA & focal seizure activity. CT head SAH w/ associated IVH in setting of presumed ruptured aneurysm. Developed progressive decreased LOC, vomited, postured. Intubated for airway protection and IVC placed per neuro-surg.   SUBJECTIVE:  Temperature spike to 104.1 overnight. Desaturation to mid 80s requiring 100 FiO2. When entering room patient BP 190-200   VITAL SIGNS: BP (!) 162/91   Pulse 78   Temp (S) (!) 104.1 F (40.1 C) (Rectal) Comment (Src): confirmed w/ rectal thermometer  Resp 15   Ht 5\' 7"  (1.702 m)   Wt 109.9 kg (242 lb 4.6 oz)   LMP 02/05/2017   SpO2 95%   BMI 37.95 kg/m   HEMODYNAMICS:    VENTILATOR SETTINGS: Vent Mode: PRVC FiO2 (%):  [40 %-60 %] 60 % Set Rate:  [18 bmp] 18 bmp Vt Set:  [400 mL] 400 mL PEEP:  [5 cmH20] 5 cmH20 Pressure Support:  [5 cmH20] 5 cmH20 Plateau Pressure:  [7 cmH20-22 cmH20] 22 cmH20  INTAKE / OUTPUT: I/O last 3 completed shifts: In: 6181.4 [I.V.:4898.2; NG/GT:776.5; IV Piggyback:506.7] Out: 2849 [Urine:2420; Drains:429]  PHYSICAL EXAMINATION:  General appearance: Adult female, moderate respiratory distress  Eyes:  Pupils equal and intact  Mouth:  membranes and no mucosal ulcerations Neck: Trachea midline; neck supple, no JVD, ETT in place   Lungs/chest: Crackles to bases, labored, on vent  CV: RRR, no MRGs  Abdomen: Soft, non-tender; no masses or HSM Extremities: No peripheral edema  Skin: warm, dry, intact, IVC in place (Draining bloody CSF)    Psych: not following commands, not opening eyes, shivering noted, sedated      LABS:  BMET  Recent Labs Lab 03/05/17 0230 03/06/17 0447 03/07/17 0532  NA  144 144 147*  K 3.4* 3.2* 3.4*  CL 113* 109 114*  CO2 23 24 22   BUN 5* <5* 8  CREATININE 0.95 0.94 1.04*  GLUCOSE 128* 108* 117*    Electrolytes  Recent Labs Lab 03/05/17 0230 03/06/17 0447 03/06/17 0905 03/06/17 1748 03/07/17 0532  CALCIUM 8.1* 8.3*  --   --  7.8*  MG  --  2.2 2.2 1.8 2.0  PHOS  --  1.8* 1.9* <1.0* 1.7*    CBC  Recent Labs Lab 03/05/17 0230 03/06/17 0447 03/07/17 0638  WBC 12.2* 14.9* 15.0*  HGB 10.5* 11.0* 11.2*  HCT 33.0* 34.6* 34.5*  PLT 159 192 209    Coag's  Recent Labs Lab 03/03/17 2041  APTT 31  INR 1.07    Sepsis Markers  Recent Labs Lab 03/06/17 1054 03/07/17 0805  PROCALCITON <0.10 1.28    ABG  Recent Labs Lab 03/03/17 1420 03/04/17 0440 03/07/17 0855  PHART 7.265* 7.413 7.408  PCO2ART 55.7* 33.2 38.5  PO2ART 150.0* 191* 80.0*    Liver Enzymes  Recent Labs Lab 03/03/17 1214  AST 45*  ALT 44  ALKPHOS 77  BILITOT 0.7  ALBUMIN 3.9    Cardiac Enzymes  Recent Labs Lab 03/04/17 1202 03/05/17 0230  TROPONINI 0.13* 0.07*    Glucose  Recent Labs Lab 03/06/17 1131 03/06/17 1514 03/06/17 1931 03/06/17 2347 03/07/17 0332 03/07/17 0732  GLUCAP 82 82 120* 143* 148* 148*  Imaging Dg Chest Port 1 View  Result Date: 03/07/2017 CLINICAL DATA:  Ventilator dependent. EXAM: PORTABLE CHEST 1 VIEW COMPARISON:  03/06/2017 FINDINGS: Enteric tube terminates at the level of the clavicles, 3.7 cm above the carina. A right PICC has been placed and terminates over the lower SVC. Enteric tube courses into the left upper abdomen with tip not imaged. The cardiac silhouette is mildly enlarged. There is persistent mild elevation of the right hemidiaphragm. There is new extensive consolidation throughout the right mid lung and right lung base with air bronchograms. The left lung is clear. There may be a small right pleural effusion. No pneumothorax is identified. IMPRESSION: 1. New extensive right lung airspace disease  which may represent pneumonia. 2. Right PICC placement as above. Electronically Signed   By: Logan Bores M.D.   On: 03/07/2017 07:17   STUDIES:  CTA 6/18: Ruptured intracranial aneurysm with hemorrhage into the left inferior frontal gyrus and then into the ventricular system via of the left lateral ventricle. 2. Positive for active extravasation of hemorrhage from a superiorly directed left supraclinoid ICA aneurysm (~5 x 3 mm).4. 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 5. Mild ventriculomegaly/transependymal edema. Absence subarachnoid hemorrhage at this time, and basilar cisterns remain patent. 6. Tortuous carotid arteries. No atherosclerosis or stenosis CT Head 6/20: Acute intra-axial hemorrhage at the anterior inferior left frontal lobe again seen, relatively stable measuring 4.1 x 2.2 cm. Similar surrounding low-density vasogenic edema. Intraventricular extension with blood in the lateral, third, and fourth ventricles, similar to previous. There has been interval placement of a right frontal approach ventricular catheter with tip seen terminating at the left thalamus. Ventricular dilatation relatively similar, with asymmetric dilatation of the left lateral ventricle as compared to the right. Basilar cisterns remain patent. No significant midline shift. No new hemorrhage. Streak artifact from coiled aneurysm noted. No acute large vessel territory infarct. No extra-axial fluid collection. CXR 6/22 > New extensive right lung airspace diease  CTA Head/Neck 6/22 >> CT Chest 6/22 >>   CULTURES: Sputum 6/21 >>> Sputum 6/22 >> Blood 6/22 >>   ANTIBIOTICS: Vancomycin 6/22 >> Zosyn 6/22 >>   SIGNIFICANT EVENTS: 6/18 > Presented to ED with [facility]  LINES/TUBES: oett 6/18>>> PICC 6/21 >>   DISCUSSION: 46 year old female admitted 6/18 with SAH w/ IVH. IVC placed in ED. Aneurysm coiled in IR. To ICU on vent.   ASSESSMENT / PLAN:  PULMONARY A: Acute  hypoxic respiratory failure in setting of HCAP, +/-Flash pulmonary edema   Ventilator dependent in setting of acute neurological insult - improved   Concern for aspiration  P:   Full vent support Unable to wean this AM due to respiratory decompensation  Trend CXR VAP Bundle  Pulmonary Hygiene  Obtain CT Chest   CARDIOVASCULAR A:  HTN  Elevated Troponin > down-trending  P:  Cardiac Monitoring  SBP goal >180 per neurosurgery  Phenylephrine/Nicardipine if needed  Holding home Maxide Obtain Troponin  ECHO pending   RENAL A:   AKI - improving  Hypokalemia P:   Trend BMP Replace electrolytes as needed  NS @ 100 ml/hr given Vasospasm   GASTROINTESTINAL A:   Dysphagia  Nutrition Needs  P:   Continue TF  PPI  HEMATOLOGIC A:   No acute  P:  SCDs Trend CBC Hold all anticoagulation   INFECTIOUS A:   ? Aspiration Event  HCAP  -Temp spiked 104.1 6/22, new right side extensive airspace disease   P:  Trend WBC and Fever Curve PAN culture  Vancomycin and Zosyn  Trend Procal   ENDOCRINE A:   Hyperglycemia  P:   Trend Glucose SSI for CBG >180   NEUROLOGIC A:   SAH w/ IVH s/p IVC placed in ED 6/18. Aneurysm coiled 6/18. Vasospasm  P:   RASS goal: 0  Per Neuro/Neurosurgery  Continue Keppra and Nimodipine CTA Head/Neck Pending   FAMILY  - Updates: husband updated at bedside 6/22   - Inter-disciplinary family meet or Palliative Care meeting due by:  6/25  CC Time: 54 minutes   Hayden Pedro, AGACNP-BC [place]  Pgr: [phone]  PCCM Pgr: [phone]

## 2017-03-07 NOTE — Progress Notes (Signed)
CRITICAL VALUE STICKER  CRITICAL VALUE: troponin 0.36  RECEIVER (on-site recipient of call): Fabio Asa, RN  [place] NOTIFIED: 03/07/17 @ 1215  MESSENGER (representative from lab):  MD NOTIFIED: Dr. Vaughan Browner, face-to-face  TIME OF NOTIFICATION: 1220  RESPONSE: no new orders

## 2017-03-07 NOTE — Procedures (Signed)
Bronchoscopy Procedure Note Carly [name] [id number] 1970/11/12  Procedure: Bronchoscopy Indications: Diagnostic evaluation of the airways, Obtain specimens for culture and/or other diagnostic studies and Remove secretions  Procedure Details Consent: Risks of procedure as well as the alternatives and risks of each were explained to the (patient/caregiver).  Consent for procedure obtained. Time Out: Verified patient identification, verified procedure, site/side was marked, verified correct patient position, special equipment/implants available, medications/allergies/relevent history reviewed, required imaging and test results available.  Performed  In preparation for procedure, patient was given 100% FiO2 and bronchoscope lubricated. Sedation: Propofol  Airway entered and the following bronchi were examined: RUL, RML, RLL, LUL, LLL and Bronchi.  Thick purulent secretions were seen which were suctioned out.  Procedures performed: Bronchial washings Bronchoscope removed. Patient placed back on 100% FiO2 at conclusion of procedure.    Evaluation Hemodynamic Status: BP stable throughout; O2 sats: stable throughout Patient's Current Condition: stable Specimens:  Sent purulent fluid Complications: No apparent complications Patient did tolerate procedure well.  [name] 03/07/2017

## 2017-03-07 NOTE — Progress Notes (Addendum)
Transcranial Doppler  Date POD PCO2 HCT BP  MCA ACA PCA OPHT SIPH VERT Basilar  03/04/17 vs     Right  Left   57  71   -45  -45   53  67   16  14   *  51   *  *   *  *    03/07/17 vs/ je     Right  Left   *  *   *  *   *  *   12  8   *  *   *  *   *           Right  Left                 Right  Left                                             Right  Left                                            Right  Left                                            Right  Left                                        MCA = Middle Cerebral Artery      OPHT = Opthalmic Artery     BASILAR = Basilar Artery   ACA = Anterior Cerebral Artery     SIPH = Carotid Siphon PCA = Posterior Cerebral Artery   VERT = Verterbral Artery                 Left Lindegaard ratio 3.07 Right lindegaard ratio 1.63 Normal MCA = 62+\-12 ACA = 50+\-12 PCA = 42+\-23   03/04/17 Unable to obtain the vertebral due to ET tube stabilizer 6/22 very limited study due to patient constant shaking/ tensing up. je/vs

## 2017-03-07 NOTE — Progress Notes (Signed)
Pt seen and examined, just returned from CT. Started on propofol, Sats somewhat improved, better vent synchrony.  EXAM: Temp:  [100 F (37.8 C)-104.1 F (40.1 C)] 104.1 F (40.1 C) (06/22 0849) Pulse Rate:  [48-103] 78 (06/22 0849) Resp:  [14-37] 15 (06/22 0849) BP: (105-195)/(51-91) 162/91 (06/22 0849) SpO2:  [77 %-100 %] 96 % (06/22 1007) FiO2 (%):  [40 %-60 %] 60 % (06/22 0421) Weight:  [109.9 kg (242 lb 4.6 oz)] 109.9 kg (242 lb 4.6 oz) (06/22 0500) Intake/Output      06/21 0701 - 06/22 0700 06/22 0701 - 06/23 0700   I.V. (mL/kg) 4298.2 (39.1)    NG/GT 696.[name]    IV Piggyback 506.7    Total Intake(mL/kg) 5501.4 (50.1)    Urine (mL/kg/hr) 1925 (0.7)    Drains 337 (0.1) 24 (0.1)   Stool 0 (0)    Total Output 2262 24   Net +3239.4 -24        Urine Occurrence 8 x    Stool Occurrence 4 x     EXAM: Sedated on propofol No eye opening Pupils sluggish No motor responses  LABS: Lab Results  Component Value Date   CREATININE 1.04 (H) 03/07/2017   BUN 8 03/07/2017   NA 147 (H) 03/07/2017   K 3.4 (L) 03/07/2017   CL 114 (H) 03/07/2017   CO2 22 03/07/2017   Lab Results  Component Value Date   WBC 15.0 (H) 03/07/2017   HGB 11.2 (L) 03/07/2017   HCT 34.[name] (L) 03/07/2017   MCV 83.[name] 03/07/2017   PLT 209 03/07/2017    IMAGING: CT/CTA reviewed. No new hemorrhage, stable IVH, no HCP.  CTA appears to demonstrates mild/moderate spasm involving primarily the dominant right A1/A2, and the basilar artery. Distal ACA and posterior circulation vessels are visibile.  IMPRESSION: - 46 y.o. female Carly [name] s/p LICA aneurysm coiling.  - Febrile, hypoxemic on last ABG - likely result of pneumonia and pulmonary edema - Likely mild/mod vasospasm  PLAN: - Cont hyperdynamic therapy, SBP goal > 120mmHg, maintain eu- or slight hypervolemia. Obviously a difficult situation with concomitant pulmonary edema, but would avoid diuretics if possible. - TCD today, cont Nimotop - Vanc/Zosyn  started - Planning on bronch today per PCCM - After bronch, when stable from resp standpoint, will give sedation vacation for neurologic assessment.

## 2017-03-07 NOTE — Progress Notes (Signed)
eLink Physician-Brief Progress Note Patient Name: Carly [name] DOB: [birth year] MRN: [id number]   Date of Service  03/07/2017  HPI/Events of Note  Nurse notified of suboptimal blood pressure for vasospasm. Transitioning from propofol to Versed and fentanyl infusions for sedation given hypotension. Patient on Versed at 3 mg per hour & worsening ventilator synchrony compared with earlier camera check. Maximum infusion for Levophed and Neo-Synephrine infusions. Status post IV Lasix.   eICU Interventions  1. Ordering vasopressin for blood pressure support 2. Continuing Levophed and Neo-Synephrine 3. Continuing positive end expiratory pressure for recruitment 4. Requested nurse increase Versed drip sedation & bolus Versed via infusion 5. Liberalizing Versed bolus via infusion 6. Holding paralytic as patient is not deeply sedated      Intervention Category Major Interventions: Shock - evaluation and management;Respiratory failure - evaluation and management  Tera Partridge 03/07/2017, 7:01 PM

## 2017-03-07 NOTE — Progress Notes (Signed)
PT Cancellation Note  Patient Details Name: Carly [name] MRN: [id number] DOB: 1971-01-15   Cancelled Treatment:    Reason Eval/Treat Not Completed: Patient continues to be not medically ready for PT. Will sign off at this time. Please reorder if/when pt becomes appropriate for physical therapy.   [name] 03/07/2017, 11:20 AM   [name], PT, DPT Acute Rehabilitation Services Pager: [phone]

## 2017-03-07 NOTE — Progress Notes (Signed)
  Echocardiogram 2D Echocardiogram has been performed.  Carly [name] Carly [name] Carly [name] 03/07/2017, 11:47 AM

## 2017-03-07 NOTE — Progress Notes (Signed)
*  PRELIMINARY RESULTS* Vascular Ultrasound Lower extremity venous duplex has been completed.  Preliminary findings: technically difficult due to patient tightening legs through test- difficult to perform venous compressions. No obvious evidence of DVT. No baker's cyst.    Landry Mellow, RDMS, RVT  03/07/2017, 1:05 PM

## 2017-03-07 NOTE — Progress Notes (Signed)
BP cuff readings unreliable at this time. RT attempted art line w/o success. Dr. Vaughan Browner aware.

## 2017-03-07 NOTE — Progress Notes (Signed)
RT attempted Art line x1 in LR, RR restricted at this time. CCM MD, NP made aware. RT will continue to monitor.

## 2017-03-07 NOTE — Progress Notes (Signed)
Received call from nursing.  Patient in poor condition currently  Temp 104.42F. Hypertensive. Tachypneic.  Neuro exam poor - pupils reactive but left is sluggish. Not responding to pain.  Dr Kathyrn Sheriff also called by nursing.  Panculture. Once patient stable, stat CT/CTA head

## 2017-03-07 NOTE — Progress Notes (Signed)
Pharmacy Antibiotic Note  Carly [name] is a 46 y.o. female admitted on 03/03/2017 with sepsis  Plan: Zosyn iv 3.375 gm iv q8h Vanc 750 mg iv q12 Monitor renal fx, cx, vt prn  Height: 5\' 7"  (170.2 cm) Weight: 242 lb 4.6 oz (109.9 kg) IBW/kg (Calculated) : 61.6  Temp (24hrs), Avg:101.1 F (38.4 C), Min:100 F (37.8 C), Max:103.8 F (39.9 C)   Recent Labs Lab 03/03/17 1823 03/03/17 2041 03/04/17 0426 03/04/17 0639 03/05/17 0230 03/06/17 0447 03/07/17 0532 03/07/17 0638  WBC  --  9.9  --  12.4* 12.2* 14.9*  --  15.0*  CREATININE 0.80  --  1.01*  --  0.95 0.94 1.04*  --     Estimated Creatinine Clearance: 85.4 mL/min (A) (by C-G formula based on SCr of 1.04 mg/dL (H)).    No Known Allergies  Levester Fresh, PharmD, BCPS, BCCCP Clinical Pharmacist 03/07/2017 8:56 AM

## 2017-03-07 NOTE — Progress Notes (Signed)
Progress Note   Blood pressure 130/62, pulse 79, temperature 99.8 F (37.7 C), temperature source Rectal, resp. rate (!) 37, height 5\' 7"  (1.702 m), weight 109.9 kg (242 lb 4.6 oz), last menstrual period 02/05/2017, SpO2 90 %.   Acute Hypoxic Respiratory Failure in setting of pulmonary edema +HCAP Plan  -Increase PEEP to 16  -Trend ABG/CXR > Obtain repeat ABG in one hour  -Continue antibiotics  -Diuresis (Spoke to Neurosurgery -- goal to prevent hypovolemia in setting of vasospasm but are okay with lasix given hypoxia) - given 40 meq lasix now  -D/C Propofol in setting of hypotension  -Start Versed and Fentanyl gtt with goal to decrease tachypnea to improve work of breathing   Hayden Pedro, AGACNP-BC [place]  Pgr: [phone]  PCCM Pgr: [phone]

## 2017-03-07 NOTE — Progress Notes (Signed)
SpO2 88% w/ FiO2 100%. Dyssynchronous w/ ventilator after 143mcg fentanyl, 2 versed. RR 38, temp 104.1 and continuing to increase despite cooling blanket. Contacted Dr. Kathyrn Sheriff, see associated orders. Lyndle Herrlich, PA at bedside. Started propofol, pt RR 21 and synchronous w/ vent. CCM at bedside at this time.

## 2017-03-07 NOTE — Procedures (Signed)
Arterial Catheter Insertion Procedure Note SARAYA TIREY [id number] 09/01/1971  Procedure: Insertion of Arterial Catheter  Indications: Blood pressure monitoring  Procedure Details Consent: Risks of procedure as well as the alternatives and risks of each were explained to the (patient/caregiver).  Consent for procedure obtained. Time Out: Verified patient identification, verified procedure, site/side was marked, verified correct patient position, special equipment/implants available, medications/allergies/relevent history reviewed, required imaging and test results available.  Performed  Maximum sterile technique was used including antiseptics, cap, gloves, gown, hand hygiene, mask and sheet. Skin prep: Chlorhexidine; local anesthetic administered 22 gauge catheter was inserted into left femoral artery using the Seldinger technique.  Evaluation Blood flow good; BP tracing good. Complications: No apparent complications.   Hayden Pedro, AGACNP-BC [facility]  Pgr: [phone]  PCCM Pgr: [phone]  Rush Farmer, M.D. [facility] Pulmonary/Critical Care Medicine. Pager: [phone]. After hours pager: [phone].

## 2017-03-07 NOTE — Progress Notes (Signed)
SLP Cancellation Note  Patient Details Name: LETETIA ROMANELLO MRN: [id number] DOB: 1971-07-26   Cancelled treatment:       Reason Eval/Treat Not Completed: Patient not medically ready. Will sign off at this time.    Shayaan Parke, Katherene Ponto 03/07/2017, 7:44 AM

## 2017-03-07 NOTE — Progress Notes (Signed)
eLink Physician-Brief Progress Note Patient Name: Carly [name] DOB: 1970/10/19 MRN: [id number]   Date of Service  03/07/2017  HPI/Events of Note  Echocardiogram ordered for elevated troponin I. A troponin I trending upward. No wall motion abnormality on echocardiogram.   eICU Interventions  No indication that would suggest a myocardial infarction for elevated troponin I.      Intervention Category Major Interventions: Other:  Tera Partridge 03/07/2017, 3:47 PM

## 2017-03-08 ENCOUNTER — Inpatient Hospital Stay (HOSPITAL_COMMUNITY): Payer: 59

## 2017-03-08 ENCOUNTER — Encounter (HOSPITAL_COMMUNITY): Payer: Self-pay

## 2017-03-08 LAB — COMPREHENSIVE METABOLIC PANEL
ALT: 10 U/L — AB (ref 14–54)
AST: 58 U/L — AB (ref 15–41)
Albumin: 2.4 g/dL — ABNORMAL LOW (ref 3.5–5.0)
Alkaline Phosphatase: 85 U/L (ref 38–126)
Anion gap: 11 (ref 5–15)
BUN: 23 mg/dL — AB (ref 6–20)
CHLORIDE: 116 mmol/L — AB (ref 101–111)
CO2: 18 mmol/L — AB (ref 22–32)
CREATININE: 2.69 mg/dL — AB (ref 0.44–1.00)
Calcium: 7 mg/dL — ABNORMAL LOW (ref 8.9–10.3)
GFR calc Af Amer: 23 mL/min — ABNORMAL LOW (ref >60)
GFR calc non Af Amer: 20 mL/min — ABNORMAL LOW (ref >60)
Glucose, Bld: 225 mg/dL — ABNORMAL HIGH (ref 65–99)
Potassium: 2.6 mmol/L — CL (ref 3.5–5.1)
SODIUM: 145 mmol/L (ref 135–145)
Total Bilirubin: 1.2 mg/dL (ref 0.3–1.2)
Total Protein: 5.8 g/dL — ABNORMAL LOW (ref 6.5–8.1)

## 2017-03-08 LAB — BASIC METABOLIC PANEL
ANION GAP: 10 (ref 5–15)
BUN: 16 mg/dL (ref 6–20)
CHLORIDE: 115 mmol/L — AB (ref 101–111)
CO2: 22 mmol/L (ref 22–32)
Calcium: 7.3 mg/dL — ABNORMAL LOW (ref 8.9–10.3)
Creatinine, Ser: 1.96 mg/dL — ABNORMAL HIGH (ref 0.44–1.00)
GFR calc non Af Amer: 29 mL/min — ABNORMAL LOW (ref >60)
GFR, EST AFRICAN AMERICAN: 34 mL/min — AB (ref >60)
Glucose, Bld: 180 mg/dL — ABNORMAL HIGH (ref 65–99)
Potassium: 2.8 mmol/L — ABNORMAL LOW (ref 3.5–5.1)
Sodium: 147 mmol/L — ABNORMAL HIGH (ref 135–145)

## 2017-03-08 LAB — CBC
HCT: 32.3 % — ABNORMAL LOW (ref 36.0–46.0)
HEMOGLOBIN: 10.9 g/dL — AB (ref 12.0–15.0)
MCH: 28.2 pg (ref 26.0–34.0)
MCHC: 33.7 g/dL (ref 30.0–36.0)
MCV: 83.7 fL (ref 78.0–100.0)
Platelets: 210 10*3/uL (ref 150–400)
RBC: 3.86 MIL/uL — AB (ref 3.87–5.11)
RDW: 15.4 % (ref 11.5–15.5)
WBC: 24.8 10*3/uL — ABNORMAL HIGH (ref 4.0–10.5)

## 2017-03-08 LAB — CULTURE, RESPIRATORY W GRAM STAIN

## 2017-03-08 LAB — POCT I-STAT 3, ART BLOOD GAS (G3+)
ACID-BASE DEFICIT: 9 mmol/L — AB (ref 0.0–2.0)
Acid-base deficit: 12 mmol/L — ABNORMAL HIGH (ref 0.0–2.0)
BICARBONATE: 16.8 mmol/L — AB (ref 20.0–28.0)
Bicarbonate: 14.7 mmol/L — ABNORMAL LOW (ref 20.0–28.0)
O2 Saturation: 72 %
O2 Saturation: 73 %
PCO2 ART: 35 mmHg (ref 32.0–48.0)
PO2 ART: 45 mmHg — AB (ref 83.0–108.0)
Patient temperature: 100.3
Patient temperature: 99
TCO2: 18 mmol/L (ref 0–100)
TCO2: 16 mmol/L (ref 0–100)
pCO2 arterial: 36.4 mmHg (ref 32.0–48.0)
pH, Arterial: 7.277 — ABNORMAL LOW (ref 7.350–7.450)
pH, Arterial: 7.232 — ABNORMAL LOW (ref 7.350–7.450)
pO2, Arterial: 46 mmHg — ABNORMAL LOW (ref 83.0–108.0)

## 2017-03-08 LAB — GLUCOSE, CAPILLARY
GLUCOSE-CAPILLARY: 111 mg/dL — AB (ref 65–99)
GLUCOSE-CAPILLARY: 134 mg/dL — AB (ref 65–99)
Glucose-Capillary: 122 mg/dL — ABNORMAL HIGH (ref 65–99)
Glucose-Capillary: 159 mg/dL — ABNORMAL HIGH (ref 65–99)
Glucose-Capillary: 231 mg/dL — ABNORMAL HIGH (ref 65–99)
Glucose-Capillary: 93 mg/dL (ref 65–99)

## 2017-03-08 LAB — PROCALCITONIN: Procalcitonin: 13.36 ng/mL

## 2017-03-08 LAB — CULTURE, RESPIRATORY

## 2017-03-08 MED ORDER — MIDAZOLAM HCL 2 MG/2ML IJ SOLN
2.0000 mg | Freq: Once | INTRAMUSCULAR | Status: AC | PRN
  Administered 2017-03-17: 2 mg via INTRAVENOUS
  Filled 2017-03-08: qty 2

## 2017-03-08 MED ORDER — POTASSIUM CHLORIDE 10 MEQ/50ML IV SOLN
INTRAVENOUS | Status: AC
  Administered 2017-03-08: 10 meq
  Filled 2017-03-08: qty 50

## 2017-03-08 MED ORDER — SODIUM CHLORIDE 0.9 % IV SOLN
2.0000 mg/h | INTRAVENOUS | Status: DC
  Administered 2017-03-09 – 2017-03-11 (×4): 6 mg/h via INTRAVENOUS
  Administered 2017-03-11: 4 mg/h via INTRAVENOUS
  Administered 2017-03-11: 6 mg/h via INTRAVENOUS
  Administered 2017-03-12: 5 mg/h via INTRAVENOUS
  Administered 2017-03-12: 6 mg/h via INTRAVENOUS
  Administered 2017-03-13: 5 mg/h via INTRAVENOUS
  Filled 2017-03-08 (×12): qty 10

## 2017-03-08 MED ORDER — DEXTROSE 5 % IV SOLN
3.0000 mg/h | INTRAVENOUS | Status: DC
  Administered 2017-03-09: 4 mg/h via INTRAVENOUS
  Filled 2017-03-08 (×3): qty 25

## 2017-03-08 MED ORDER — POTASSIUM CHLORIDE 10 MEQ/50ML IV SOLN: 10.0000 meq | INTRAVENOUS | Status: DC

## 2017-03-08 MED ORDER — FENTANYL BOLUS VIA INFUSION
50.0000 ug | INTRAVENOUS | Status: DC | PRN
  Administered 2017-03-17: 50 ug via INTRAVENOUS
  Filled 2017-03-08: qty 50

## 2017-03-08 MED ORDER — SODIUM CHLORIDE 0.9 % IV SOLN
3.0000 ug/kg/min | INTRAVENOUS | Status: DC
  Administered 2017-03-09: 3 ug/kg/min via INTRAVENOUS
  Administered 2017-03-09: 2 ug/kg/min via INTRAVENOUS
  Administered 2017-03-10: 1 ug/kg/min via INTRAVENOUS
  Administered 2017-03-11 – 2017-03-12 (×2): 3 ug/kg/min via INTRAVENOUS
  Filled 2017-03-08 (×5): qty 20

## 2017-03-08 MED ORDER — MIDAZOLAM HCL 2 MG/2ML IJ SOLN: 2.0000 mg | Freq: Once | INTRAMUSCULAR | Status: DC

## 2017-03-08 MED ORDER — FUROSEMIDE 10 MG/ML IJ SOLN
60.0000 mg | Freq: Two times a day (BID) | INTRAMUSCULAR | Status: DC
  Administered 2017-03-08: 60 mg via INTRAVENOUS
  Filled 2017-03-08: qty 6

## 2017-03-08 MED ORDER — LINEZOLID 600 MG/300ML IV SOLN
600.0000 mg | Freq: Two times a day (BID) | INTRAVENOUS | Status: DC
  Administered 2017-03-08 – 2017-03-09 (×4): 600 mg via INTRAVENOUS
  Filled 2017-03-08 (×5): qty 300

## 2017-03-08 MED ORDER — MIDAZOLAM BOLUS VIA INFUSION
2.0000 mg | INTRAVENOUS | Status: DC | PRN
  Filled 2017-03-08: qty 2

## 2017-03-08 MED ORDER — SODIUM CHLORIDE 0.9 % IV SOLN
0.0000 mg/h | INTRAVENOUS | Status: DC
  Administered 2017-03-08: 9 mg/h via INTRAVENOUS
  Filled 2017-03-08: qty 20

## 2017-03-08 MED ORDER — POTASSIUM CHLORIDE 10 MEQ/50ML IV SOLN
10.0000 meq | INTRAVENOUS | Status: AC
  Administered 2017-03-08 (×4): 10 meq via INTRAVENOUS
  Filled 2017-03-08 (×4): qty 50

## 2017-03-08 MED ORDER — PANTOPRAZOLE SODIUM 40 MG PO PACK
40.0000 mg | PACK | Freq: Every day | ORAL | Status: DC
  Administered 2017-03-09 – 2017-03-25 (×15): 40 mg
  Filled 2017-03-08 (×16): qty 20

## 2017-03-08 MED ORDER — FENTANYL 2500MCG IN NS 250ML (10MCG/ML) PREMIX INFUSION
100.0000 ug/h | INTRAVENOUS | Status: DC
  Administered 2017-03-09: 100 ug/h via INTRAVENOUS
  Administered 2017-03-10 – 2017-03-11 (×2): 125 ug/h via INTRAVENOUS
  Administered 2017-03-12: 275 ug/h via INTRAVENOUS
  Administered 2017-03-12: 300 ug/h via INTRAVENOUS
  Administered 2017-03-13 – 2017-03-14 (×4): 275 ug/h via INTRAVENOUS
  Administered 2017-03-14: 200 ug/h via INTRAVENOUS
  Administered 2017-03-16: 100 ug/h via INTRAVENOUS
  Administered 2017-03-16: 75 ug/h via INTRAVENOUS
  Administered 2017-03-17: 12:00:00 via INTRAVENOUS
  Administered 2017-03-17: 100 ug/h via INTRAVENOUS
  Administered 2017-03-17: 175 ug/h via INTRAVENOUS
  Administered 2017-03-18: 100 ug/h via INTRAVENOUS
  Administered 2017-03-18: 300 ug/h via INTRAVENOUS
  Administered 2017-03-19: 200 ug/h via INTRAVENOUS
  Administered 2017-03-20: 160 ug/h via INTRAVENOUS
  Administered 2017-03-21: 125 ug/h via INTRAVENOUS
  Administered 2017-03-21: 200 ug/h via INTRAVENOUS
  Filled 2017-03-08 (×20): qty 250

## 2017-03-08 MED ORDER — SODIUM CHLORIDE 0.9 % IV SOLN
0.0000 ug/min | INTRAVENOUS | Status: DC
  Administered 2017-03-08 – 2017-03-10 (×14): 400 ug/min via INTRAVENOUS
  Administered 2017-03-10: 150 ug/min via INTRAVENOUS
  Administered 2017-03-11: 325 ug/min via INTRAVENOUS
  Administered 2017-03-11: 360 ug/min via INTRAVENOUS
  Administered 2017-03-11: 50 ug/min via INTRAVENOUS
  Filled 2017-03-08 (×14): qty 8
  Filled 2017-03-08: qty 5
  Filled 2017-03-08 (×8): qty 8

## 2017-03-08 MED ORDER — ARTIFICIAL TEARS OPHTHALMIC OINT
1.0000 "application " | TOPICAL_OINTMENT | Freq: Three times a day (TID) | OPHTHALMIC | Status: DC
  Administered 2017-03-09 – 2017-03-14 (×16): 1 via OPHTHALMIC
  Filled 2017-03-08: qty 3.5

## 2017-03-08 NOTE — Progress Notes (Signed)
eLink Physician-Brief Progress Note Patient Name: Carly [name] DOB: [birth year] MRN: [id number]   Date of Service  03/08/2017  HPI/Events of Note  Patient with persistent hypoxia. Reviewed her portable chest x-ray which does show persistent bilateral opacities right greater than left with some consolidation on the right given the presence of air bronchograms. Can recheck shows patient with good lung compliance and questionable ventilator synchrony. Somewhat tachypneic above the set rate. Currently on vasopressor support for cerebral perfusion pressure and vasospasm. Questionable oxygenation with FiO2 1.0. Nurse at bedside. Only 200 mL output from bolus Lasix earlier.   eICU Interventions  1. Discontinuing bolus Lasix 2. Starting Lasix drip at 4 mg per hour 3. Holding tube feeds 4. Starting Nimbex drip for paralysis 5. Monitoring sedation and paralysis with train-of-four and BIS monitoring 6. Deep sedation as per protocol with Versed and fentanyl infusions 7. Ventilator order adjusted to increase set rate to 32 breaths per minute to match patient effort  8. Repeat ABG with a.m. labs      Intervention Category Major Interventions: Sepsis - evaluation and management;Respiratory failure - evaluation and management  Tera Partridge 03/08/2017, 11:41 PM

## 2017-03-08 NOTE — Progress Notes (Addendum)
NEUROSURGERY PROGRESS NOTE  Minimal movement to painful stimuli as patient is still heavily sedated on versed and fentanyl. Will defer wakeup assessment since patient becomes very desynchronous on the ventilator. Continue BP goal >180. IVC averaging output of 8-14mL/hr  Temp:  [99.8 F (37.7 C)-104.1 F (40.1 C)] 100.4 F (38 C) (06/22 2000) Pulse Rate:  [77-95] 77 (06/23 0600) Resp:  [15-41] 21 (06/23 0700) BP: (127-195)/(62-103) 130/62 (06/22 1517) SpO2:  [88 %-100 %] 97 % (06/23 0600) Arterial Line BP: (143-205)/(69-117) 159/88 (06/23 0700) FiO2 (%):  [100 %] 100 % (06/23 0402) Weight:  [117.6 kg (259 lb 4.2 oz)] 117.6 kg (259 lb 4.2 oz) (06/23 0500)  Eleonore Chiquito, NP 03/08/2017 7:56 AM   Seen and agree...aggressive pulmonary management.  If triple H therapy is detrimental to current pulmonary staus we can scale back.  Patient does have documented spasm.  GPC

## 2017-03-08 NOTE — Progress Notes (Signed)
eLink Physician-Brief Progress Note Patient Name: Carly [name] DOB: 1971-07-30 MRN: [id number]   Date of Service  03/08/2017  HPI/Events of Note  Hypokalemia  eICU Interventions  Potassium replaced     Intervention Category Intermediate Interventions: Electrolyte abnormality - evaluation and management  DETERDING,ELIZABETH 03/08/2017, 5:08 AM

## 2017-03-08 NOTE — Progress Notes (Signed)
eLink Physician-Brief Progress Note Patient Name: Carly [name] DOB: [birth year] MRN: [id number]   Date of Service  03/08/2017  HPI/Events of Note  Urinary retention recurrent. Low K and worsening renal insuff  eICU Interventions  Place foley Potassium replacement and repeat BMP at 2300     Intervention Category Minor Interventions: Electrolytes abnormality - evaluation and management  Mauri Brooklyn, P 03/08/2017, 5:42 PM

## 2017-03-08 NOTE — Progress Notes (Signed)
PULMONARY / CRITICAL CARE MEDICINE   Name: Carly [name] MRN: [id number] DOB: 12-20-1970    ADMISSION DATE:  03/03/2017 CONSULTATION DATE:  6/18  REFERRING MD:  Dr. Ralene Bathe EDP  CHIEF COMPLAINT: Headache  Brief:   46 year old female with PMH as below, which is significant for HTN, GERD, and migraines admitted 6/18 for HA & focal seizure activity. CT head SAH w/ associated IVH in setting of presumed ruptured aneurysm. Developed progressive decreased LOC, vomited, postured. Intubated for airway protection and IVC placed per neuro-surg.   SUBJECTIVE:  Sedated BP labile Growing SA in sputum   VITAL SIGNS: BP (!) 113/23   Pulse 89   Temp 98.7 F (37.1 C) (Rectal) Comment (Src): cooling blanket  Resp (!) 26   Ht 5\' 7"  (1.702 m)   Wt 259 lb 4.2 oz (117.6 kg)   SpO2 95%   BMI 40.61 kg/m   HEMODYNAMICS:    VENTILATOR SETTINGS: Vent Mode: PRVC FiO2 (%):  [100 %] 100 % Set Rate:  [18 bmp] 18 bmp Vt Set:  [400 mL] 400 mL PEEP:  [5 cmH20-16 cmH20] 16 cmH20 Plateau Pressure:  [16 cmH20-35 cmH20] 26 cmH20  INTAKE / OUTPUT: I/O last 3 completed shifts: In: 7984.9 [I.V.:6695.4; NG/GT:1039.5; IV Piggyback:250] Out: 3509 [Urine:3050; Drains:459]  Physical Exam  Constitutional: She appears well-developed and well-nourished. She is sedated and intubated.  HENT:  Head: Normocephalic and atraumatic.  Ventricular drain w/ old blood  Eyes: Conjunctivae and EOM are normal. Pupils are equal, round, and reactive to light. No scleral icterus.  Cardiovascular: Normal rate, regular rhythm, normal heart sounds and normal pulses.   Pulmonary/Chest: No accessory muscle usage. She is intubated. No respiratory distress. She has rhonchi in the right upper field, the right middle field and the right lower field.  Abdominal: Soft. Normal appearance and bowel sounds are normal.  Neurological: GCS eye subscore is 1. GCS verbal subscore is 1. GCS motor subscore is 4.  Currently heavily sedated.  Has  self extuabated  Skin: Skin is warm, dry and intact.    LABS:  BMET  Recent Labs Lab 03/06/17 0447 03/07/17 0532 03/08/17 0420  NA 144 147* 147*  K 3.2* 3.4* 2.8*  CL 109 114* 115*  CO2 24 22 22   BUN <5* 8 16  CREATININE 0.94 1.04* 1.96*  GLUCOSE 108* 117* 180*    Electrolytes  Recent Labs Lab 03/06/17 0447  03/06/17 1748 03/07/17 0532 03/07/17 1630 03/08/17 0420  CALCIUM 8.3*  --   --  7.8*  --  7.3*  MG 2.2  < > 1.8 2.0 1.8  --   PHOS 1.8*  < > <1.0* 1.7* 2.1*  --   < > = values in this interval not displayed.  CBC  Recent Labs Lab 03/06/17 0447 03/07/17 0638 03/08/17 0420  WBC 14.9* 15.0* 24.8*  HGB 11.0* 11.2* 10.9*  HCT 34.6* 34.5* 32.3*  PLT 192 209 210    Coag's  Recent Labs Lab 03/03/17 2041  APTT 31  INR 1.07    Sepsis Markers  Recent Labs Lab 03/06/17 1054 03/07/17 0805 03/08/17 0420  PROCALCITON <0.10 1.28 13.36    ABG  Recent Labs Lab 03/07/17 0855 03/07/17 1514 03/07/17 1745  PHART 7.408 7.370 7.350  PCO2ART 38.5 37.2 37.7  PO2ART 80.0* 52.0* 69.8*    Liver Enzymes  Recent Labs Lab 03/03/17 1214  AST 45*  ALT 44  ALKPHOS 77  BILITOT 0.7  ALBUMIN 3.9    Cardiac Enzymes  Recent Labs Lab 03/04/17 1202 03/05/17 0230 03/07/17 1043  TROPONINI 0.13* 0.07* 0.36*    Glucose  Recent Labs Lab 03/07/17 1533 03/07/17 1939 03/07/17 2352 03/08/17 0349 03/08/17 0740 03/08/17 1122  GLUCAP 128* 173* 152* 93 122* 111*    Imaging Dg Chest Port 1 View  Result Date: 03/08/2017 CLINICAL DATA:  Ventilator dependent. EXAM: PORTABLE CHEST 1 VIEW COMPARISON:  03/07/2017 FINDINGS: Endotracheal tube terminates 2 cm above the carina. Right PICC terminates over the cavoatrial junction. Feeding tube is incompletely visualized but appears unchanged and likely terminates near the ligament of Treitz. The cardiac silhouette is mildly enlarged. There is persistent extensive airspace opacity throughout the right lung with  milder airspace opacity in the left mid and lower lungs, stable to mildly improved on the right and not significantly changed on the left. No sizable pleural effusion or pneumothorax is evident. IMPRESSION: Persistent right greater than left lung airspace opacities concerning for pneumonia, at most minimally improved on the right. Electronically Signed   By: Logan Bores M.D.   On: 03/08/2017 08:14   Dg Chest Port 1 View  Result Date: 03/07/2017 CLINICAL DATA:  Bronchoscopy. EXAM: PORTABLE CHEST 1 VIEW COMPARISON:  CT 03/07/2017.  Chest x-ray 03/07/2017. FINDINGS: Endotracheal tube, feeding tube, right PICC line noted in stable position. Heart size normal. Bilateral pulmonary infiltrates/edema, right side greater than left again noted. No interim improvement. Small right pleural effusion unchanged. No pneumothorax . IMPRESSION: 1. Lines and tubes in stable position. 2. Bilateral pulmonary infiltrates/edema, right side greater left. No interim change from prior exam. Small right pleural effusion unchanged. Electronically Signed   By: Marcello Moores  Register   On: 03/07/2017 12:28   STUDIES:  CTA 6/18: Ruptured intracranial aneurysm with hemorrhage into the left inferior frontal gyrus and then into the ventricular system via of the left lateral ventricle. 2. Positive for active extravasation of hemorrhage from a superiorly directed left supraclinoid ICA aneurysm (~5 x 3 mm).4. 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 5. Mild ventriculomegaly/transependymal edema. Absence subarachnoid hemorrhage at this time, and basilar cisterns remain patent. 6. Tortuous carotid arteries. No atherosclerosis or stenosis CT Head 6/20: Acute intra-axial hemorrhage at the anterior inferior left frontal lobe again seen, relatively stable measuring 4.1 x 2.2 cm. Similar surrounding low-density vasogenic edema. Intraventricular extension with blood in the lateral, third, and fourth ventricles,  similar to previous. There has been interval placement of a right frontal approach ventricular catheter with tip seen terminating at the left thalamus. Ventricular dilatation relatively similar, with asymmetric dilatation of the left lateral ventricle as compared to the right. Basilar cisterns remain patent. No significant midline shift. No new hemorrhage. Streak artifact from coiled aneurysm noted. No acute large vessel territory infarct. No extra-axial fluid collection. CXR 6/22 > New extensive right lung airspace diease  CTA Head/Neck 6/22 >>1. Satisfactory post coil embolization appearance of the left supraclinoid ICA aneurysm. 2. 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 Left MCA and bilateral PCA Vasa spasm appears unchanged. 3. Questionable new high-grade stenosis at the left vertebral artery origin. The left vertebral artery otherwise appears stable and within normal limits throughout its course. It functionally terminates in PICA. 4. Otherwise stable arterial findings in the neck. CT Chest 6/22 >> IMPRESSION: Multifocal airspace opacities, most confluent throughout the right lung and in the lower lobes concerning for multifocal pneumonia.  CULTURES: Sputum 6/21 >>>abundant  SA>>> Sputum 6/22 >>gpc>>> Blood 6/22 >>   ANTIBIOTICS: Vancomycin 6/22 >>6/23 zyvox 6/23>>> Zosyn 6/22 >>   SIGNIFICANT EVENTS: 6/18 > Presented to ED with [facility]  LINES/TUBES: oett 6/18>>> PICC 6/21 >>     ASSESSMENT / PLAN:  SAH w/ IVH s/p IVC placed in ED 6/18. Aneurysm coiled 6/18. Vasospasm  Plan:   RASS goal: -2  IVC per neuro Cont nimodipine  Cont HHH therapy (goal sbp ~180) Cont keppra  Acute hypoxic respiratory failure in setting of HCAP, +/-Flash pulmonary edema   Ventilator dependent in setting of acute neurological insult - improved   Concern for  aspiration-->SA pneumonia/HCAP 6/23: pcxr personally reviewed. Right > left airspace disease worse. Support tubes in good place.  CVP 13 Plan Cont full vent support Wean FIO2 and peep as able  PAD protocol Repeat abg and cxr in am  Cont abx day 2, narrow when cultures complete (changing vanc to zyvox) cont zosyn   HTN w/ HHH therapy  Elevated Troponin > down-trending  ->BP lower than goal-->seemingly d/t sedation  Plan Cont levophed,neo and vasopressin Cont tele Decrease sedation   AKI-->worse..Likely d/t angio and vanc  Fluid and electrolyte d/o: hypernatremia and hyperchloremia  Plan:   Cont NS at 100  Replace K F/u am chemistry and this AN Will change vanc to zyvox given worsening renal fxn    Dysphagia  Nutrition Needs  Plan:   Continue TF  PPI  Anemia of critical illness plan scds [employer] ac Transfuse per protocol    Hyperglycemia  Plan Trend cbg ssi   DVT prophylaxis: scd SUP: ppi  Diet: tubefeed Activity: br Disposition : icu  DISCUSSION: 46 year old female admitted 6/18 with SAH w/ IVH. IVC placed in ED. Aneurysm coiled in IR. To ICU on vent.  Now supported on vent + vasospasm-->treating w/ HHH therapy and nimodipine.  Course c/b HCAP.  Also now AKI For today: Change abx to zosyn/zyvox Cont HHH therapy and supportive care.   My cct 57 min  Erick Colace ACNP-BC [place] Pager # [phone] OR # [phone] if no answer  Attending Note:  46 year old female SAH and staph pneumonia.  She is currently in ARDS.  Sat of 81% on 100% and 16 of PEEP.  Diffuse crackles on exam.  I reviewed CXR myself, ETT in good position with diffuse infiltrate noted.  Will Continue full vent support.  Continue high PEEP.  Abx as ordered (zosyn and zyvox from vanc).  F/U on culture sensitivities.  Will need diureses but concern is for brain function.  KVO IVF though.  Renal function is of serious concern, if continues to worsen may need to  consider dialysis.  The patient is critically ill with multiple organ systems failure and requires high complexity decision making for assessment and support, frequent evaluation and titration of therapies, application of advanced monitoring technologies and extensive interpretation of multiple databases.   Critical Care Time devoted to patient care services described in this note is  35  Minutes. This time reflects time of care of this signee Dr Jennet Maduro. This critical care time does not reflect procedure time, or teaching time or supervisory time of PA/NP/Med student/Med Resident etc but could involve care discussion time.  Rush Farmer, M.D. [facility] Pulmonary/Critical Care Medicine. Pager: [phone]. After hours pager: [phone].

## 2017-03-08 NOTE — Progress Notes (Signed)
CDS called for potential death. Referral [phone].  Awaiting return call from coordinator.

## 2017-03-08 NOTE — Progress Notes (Signed)
eLink Physician-Brief Progress Note Patient Name: Carly [name] DOB: 23-May-1971 MRN: [id number]   Date of Service  03/08/2017  HPI/Events of Note  SAH Severe hypoxemia presume related to volume overload. Discussed with neurosurgery  eICU Interventions  ABG now Increase peep to 18 Lasix now     Intervention Category Major Interventions: Hypoxemia - evaluation and management  Mauri Brooklyn, P 03/08/2017, 9:34 PM

## 2017-03-09 ENCOUNTER — Inpatient Hospital Stay (HOSPITAL_COMMUNITY): Payer: 59

## 2017-03-09 DIAGNOSIS — J80 Acute respiratory distress syndrome: Secondary | ICD-10-CM

## 2017-03-09 DIAGNOSIS — J189 Pneumonia, unspecified organism: Secondary | ICD-10-CM

## 2017-03-09 LAB — COMPREHENSIVE METABOLIC PANEL
ALT: 57 U/L — ABNORMAL HIGH (ref 14–54)
AST: 102 U/L — ABNORMAL HIGH (ref 15–41)
Albumin: 2.1 g/dL — ABNORMAL LOW (ref 3.5–5.0)
Alkaline Phosphatase: 86 U/L (ref 38–126)
Anion gap: 13 (ref 5–15)
BUN: 27 mg/dL — ABNORMAL HIGH (ref 6–20)
CO2: 17 mmol/L — ABNORMAL LOW (ref 22–32)
Calcium: 7 mg/dL — ABNORMAL LOW (ref 8.9–10.3)
Chloride: 117 mmol/L — ABNORMAL HIGH (ref 101–111)
Creatinine, Ser: 2.72 mg/dL — ABNORMAL HIGH (ref 0.44–1.00)
GFR calc Af Amer: 23 mL/min — ABNORMAL LOW (ref >60)
GFR calc non Af Amer: 20 mL/min — ABNORMAL LOW (ref >60)
Glucose, Bld: 209 mg/dL — ABNORMAL HIGH (ref 65–99)
Potassium: 2.6 mmol/L — CL (ref 3.5–5.1)
Sodium: 147 mmol/L — ABNORMAL HIGH (ref 135–145)
Total Bilirubin: 1.1 mg/dL (ref 0.3–1.2)
Total Protein: 5.3 g/dL — ABNORMAL LOW (ref 6.5–8.1)

## 2017-03-09 LAB — BLOOD GAS, ARTERIAL
ACID-BASE DEFICIT: 12.6 mmol/L — AB (ref 0.0–2.0)
BICARBONATE: 15.3 mmol/L — AB (ref 20.0–28.0)
Drawn by: 28340
FIO2: 100
LHR: 32 {breaths}/min
O2 SAT: 68.4 %
PEEP/CPAP: 16 cmH2O
PH ART: 7.11 — AB (ref 7.350–7.450)
Patient temperature: 98.2
VT: 400 mL
pCO2 arterial: 50.1 mmHg — ABNORMAL HIGH (ref 32.0–48.0)
pO2, Arterial: 43.5 mmHg — ABNORMAL LOW (ref 83.0–108.0)

## 2017-03-09 LAB — BASIC METABOLIC PANEL
Anion gap: 12 (ref 5–15)
Anion gap: 9 (ref 5–15)
BUN: 26 mg/dL — AB (ref 6–20)
BUN: 29 mg/dL — ABNORMAL HIGH (ref 6–20)
CALCIUM: 7.3 mg/dL — AB (ref 8.9–10.3)
CHLORIDE: 118 mmol/L — AB (ref 101–111)
CO2: 18 mmol/L — AB (ref 22–32)
CO2: 15 mmol/L — AB (ref 22–32)
CREATININE: 2.69 mg/dL — AB (ref 0.44–1.00)
CREATININE: 2.65 mg/dL — AB (ref 0.44–1.00)
Calcium: 6.9 mg/dL — ABNORMAL LOW (ref 8.9–10.3)
Chloride: 116 mmol/L — ABNORMAL HIGH (ref 101–111)
GFR calc Af Amer: 24 mL/min — ABNORMAL LOW (ref >60)
GFR calc non Af Amer: 20 mL/min — ABNORMAL LOW (ref >60)
GFR, EST AFRICAN AMERICAN: 23 mL/min — AB (ref >60)
GFR, EST NON AFRICAN AMERICAN: 20 mL/min — AB (ref >60)
GLUCOSE: 223 mg/dL — AB (ref 65–99)
Glucose, Bld: 183 mg/dL — ABNORMAL HIGH (ref 65–99)
Potassium: 2.5 mmol/L — CL (ref 3.5–5.1)
Potassium: 2.9 mmol/L — ABNORMAL LOW (ref 3.5–5.1)
SODIUM: 143 mmol/L (ref 135–145)
Sodium: 145 mmol/L (ref 135–145)

## 2017-03-09 LAB — CULTURE, BAL-QUANTITATIVE W GRAM STAIN

## 2017-03-09 LAB — GLUCOSE, CAPILLARY
GLUCOSE-CAPILLARY: 198 mg/dL — AB (ref 65–99)
GLUCOSE-CAPILLARY: 190 mg/dL — AB (ref 65–99)
GLUCOSE-CAPILLARY: 219 mg/dL — AB (ref 65–99)
GLUCOSE-CAPILLARY: 185 mg/dL — AB (ref 65–99)
Glucose-Capillary: 166 mg/dL — ABNORMAL HIGH (ref 65–99)

## 2017-03-09 LAB — CBC
HCT: 27.8 % — ABNORMAL LOW (ref 36.0–46.0)
Hemoglobin: 8.9 g/dL — ABNORMAL LOW (ref 12.0–15.0)
MCH: 27.1 pg (ref 26.0–34.0)
MCHC: 32 g/dL (ref 30.0–36.0)
MCV: 84.5 fL (ref 78.0–100.0)
Platelets: 169 10*3/uL (ref 150–400)
RBC: 3.29 MIL/uL — ABNORMAL LOW (ref 3.87–5.11)
RDW: 16.2 % — ABNORMAL HIGH (ref 11.5–15.5)
WBC: 20.6 10*3/uL — ABNORMAL HIGH (ref 4.0–10.5)

## 2017-03-09 LAB — MAGNESIUM: MAGNESIUM: 2 mg/dL (ref 1.7–2.4)

## 2017-03-09 LAB — CULTURE, RESPIRATORY W GRAM STAIN: Special Requests: NORMAL

## 2017-03-09 LAB — CULTURE, BAL-QUANTITATIVE

## 2017-03-09 LAB — CULTURE, RESPIRATORY

## 2017-03-09 MED ORDER — POTASSIUM CHLORIDE 20 MEQ PO PACK
40.0000 meq | PACK | Freq: Once | ORAL | Status: AC
  Administered 2017-03-09: 40 meq via ORAL
  Filled 2017-03-09: qty 2

## 2017-03-09 MED ORDER — POTASSIUM CHLORIDE 10 MEQ/50ML IV SOLN
10.0000 meq | INTRAVENOUS | Status: AC
  Administered 2017-03-09 – 2017-03-10 (×6): 10 meq via INTRAVENOUS
  Filled 2017-03-09 (×6): qty 50

## 2017-03-09 MED ORDER — SODIUM BICARBONATE 8.4 % IV SOLN
INTRAVENOUS | Status: DC
  Administered 2017-03-09 – 2017-03-10 (×3): via INTRAVENOUS
  Filled 2017-03-09 (×5): qty 150

## 2017-03-09 MED ORDER — POTASSIUM CHLORIDE 10 MEQ/50ML IV SOLN
10.0000 meq | INTRAVENOUS | Status: AC
  Administered 2017-03-09 (×6): 10 meq via INTRAVENOUS
  Filled 2017-03-09 (×6): qty 50

## 2017-03-09 NOTE — Progress Notes (Signed)
CRITICAL VALUE ALERT  Critical Value: K 2.5  Date & Time Notied:  03/09/2017 1900  Provider Notified: Dr. Maia Petties  Orders Received/Actions taken: MD will order 6 runs of k and 90meq via tube

## 2017-03-09 NOTE — Progress Notes (Signed)
PULMONARY / CRITICAL CARE MEDICINE   Name: Carly [name] MRN: [id number] DOB: 09-Mar-1971    ADMISSION DATE:  03/03/2017 CONSULTATION DATE:  6/18  REFERRING MD:  Dr. Ralene Bathe EDP  CHIEF COMPLAINT: Headache  Brief:   46 year old female with PMH as below, which is significant for HTN, GERD, and migraines admitted 6/18 for HA & focal seizure activity. CT head SAH w/ associated IVH in setting of presumed ruptured aneurysm. Developed progressive decreased LOC, vomited, postured. Intubated for airway protection and IVC placed per neuro-surg.   SUBJECTIVE:  Desaturates BP w/ progressive shock Critically ill  VITAL SIGNS: BP (!) 78/40   Pulse 91   Temp 99 F (37.2 C) (Rectal)   Resp (!) 32   Ht 5\' 7"  (1.702 m)   Wt 267 lb 10.2 oz (121.4 kg)   SpO2 (!) 67%   BMI 41.92 kg/m   HEMODYNAMICS: CVP:  [22 mmHg] 22 mmHg  VENTILATOR SETTINGS: Vent Mode: PRVC FiO2 (%):  [100 %] 100 % Set Rate:  [18 bmp-32 bmp] 32 bmp Vt Set:  [400 mL] 400 mL PEEP:  [16 cmH20-18 cmH20] 16 cmH20 Pressure Support:  [16 cmH20] 16 cmH20 Plateau Pressure:  [19 cmH20-31 cmH20] 28 cmH20  INTAKE / OUTPUT: I/O last 3 completed shifts: In: 10164.1 [I.V.:7994.1; NG/GT:870; IV Piggyback:1300] Out: 1696 [Urine:3385; Drains:343]  Physical Exam  Constitutional: She appears well-developed and well-nourished. No distress. She is sedated and intubated.  HENT:  Head: Normocephalic and atraumatic.  Cardiovascular: Normal rate, regular rhythm and normal heart sounds.   Pulmonary/Chest: She is intubated. She has decreased breath sounds.  Abdominal: Soft. Normal appearance and bowel sounds are normal. There is no tenderness.  Genitourinary:  Genitourinary Comments: Clear yellow but minimal urine output   Neurological: GCS eye subscore is 1. GCS verbal subscore is 1. GCS motor subscore is 1.  On NMB gtt   Skin: She is not diaphoretic.   LABS:  BMET  Recent Labs Lab 03/08/17 0420 03/08/17 1601 03/09/17 0453  NA  147* 145 147*  K 2.8* 2.6* 2.6*  CL 115* 116* 117*  CO2 22 18* 17*  BUN 16 23* 27*  CREATININE 1.96* 2.69* 2.72*  GLUCOSE 180* 225* 209*    Electrolytes  Recent Labs Lab 03/06/17 1748 03/07/17 0532 03/07/17 1630  03/08/17 0420 03/08/17 1601 03/09/17 0453  CALCIUM  --  7.8*  --   < > 7.3* 7.0* 7.0*  MG 1.8 2.0 1.8  --   --   --  2.0  PHOS <1.0* 1.7* 2.1*  --   --   --   --   < > = values in this interval not displayed.  CBC  Recent Labs Lab 03/07/17 0638 03/08/17 0420 03/09/17 0453  WBC 15.0* 24.8* 20.6*  HGB 11.2* 10.9* 8.9*  HCT 34.5* 32.3* 27.8*  PLT 209 210 169    Coag's  Recent Labs Lab 03/03/17 2041  APTT 31  INR 1.07    Sepsis Markers  Recent Labs Lab 03/06/17 1054 03/07/17 0805 03/08/17 0420  PROCALCITON <0.10 1.28 13.36    ABG  Recent Labs Lab 03/08/17 1250 03/08/17 2250 03/09/17 0351  PHART 7.277* 7.232* 7.110*  PCO2ART 36.4 35.0 50.1*  PO2ART 45.0* 46.0* 43.5*    Liver Enzymes  Recent Labs Lab 03/03/17 1214 03/08/17 1601 03/09/17 0453  AST 45* 58* 102*  ALT 44 10* 57*  ALKPHOS 77 85 86  BILITOT 0.7 1.2 1.1  ALBUMIN 3.9 2.4* 2.1*    Cardiac Enzymes  Recent Labs Lab 03/04/17 1202 03/05/17 0230 03/07/17 1043  TROPONINI 0.13* 0.07* 0.36*    Glucose  Recent Labs Lab 03/08/17 1610 03/08/17 2007 03/09/17 0001 03/09/17 0336 03/09/17 0743 03/09/17 1112  GLUCAP 231* 134* 159* 198* 190* 219*    Imaging Dg Chest Port 1 View  Result Date: 03/08/2017 CLINICAL DATA:  Hypoxia. EXAM: PORTABLE CHEST 1 VIEW COMPARISON:  Chest radiograph March 08, 2017 at 0525 hours FINDINGS: Cardiac silhouette is mildly enlarged and unchanged. Mediastinal silhouette is normal. Endotracheal tube tip projects 2.2 cm above the carina. RIGHT PICC distal tip projecting cavoatrial junction. Feeding tube past proximal stomach, distal tip out of field-of-view. Increasing interstitial and alveolar airspace opacities with air bronchograms. Small  RIGHT pleural effusion. No pneumothorax. Soft tissue planes and included osseous structures are unchanged. IMPRESSION: Cardiomegaly with increasing interstitial and alveolar airspace opacities concerning for pulmonary edema and/or pneumonia. No apparent change in life-support lines. Electronically Signed   By: Elon Alas M.D.   On: 03/08/2017 22:43   STUDIES:  CTA 6/18: Ruptured intracranial aneurysm with hemorrhage into the left inferior frontal gyrus and then into the ventricular system via of the left lateral ventricle. 2. Positive for active extravasation of hemorrhage from a superiorly directed left supraclinoid ICA aneurysm (~5 x 3 mm).4. 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 5. Mild ventriculomegaly/transependymal edema. Absence subarachnoid hemorrhage at this time, and basilar cisterns remain patent. 6. Tortuous carotid arteries. No atherosclerosis or stenosis CT Head 6/20: Acute intra-axial hemorrhage at the anterior inferior left frontal lobe again seen, relatively stable measuring 4.1 x 2.2 cm. Similar surrounding low-density vasogenic edema. Intraventricular extension with blood in the lateral, third, and fourth ventricles, similar to previous. There has been interval placement of a right frontal approach ventricular catheter with tip seen terminating at the left thalamus. Ventricular dilatation relatively similar, with asymmetric dilatation of the left lateral ventricle as compared to the right. Basilar cisterns remain patent. No significant midline shift. No new hemorrhage. Streak artifact from coiled aneurysm noted. No acute large vessel territory infarct. No extra-axial fluid collection. CXR 6/22 > New extensive right lung airspace diease  CTA Head/Neck 6/22 >>1. Satisfactory post coil embolization appearance of the left supraclinoid ICA aneurysm. 2. Continued intracranial artery vasospasm suspected, but improved caliber and enhancement of  the bilateral ACAs, and perhaps also the right MCA branches since the presentation CTA. Left MCA and bilateral PCA Vasa spasm appears unchanged. 3. Questionable new high-grade stenosis at the left vertebral artery origin. The left vertebral artery otherwise appears stable and within normal limits throughout its course. It functionally terminates in PICA. 4. Otherwise stable arterial findings in the neck. CT Chest 6/22 >> IMPRESSION: Multifocal airspace opacities, most confluent throughout the right lung and in the lower lobes concerning for multifocal pneumonia.  CULTURES: Sputum 6/21 >>>abundant SA>>> Sputum 6/22 >>gpc>>> Blood 6/22 >>   ANTIBIOTICS: Vancomycin 6/22 >>6/23 zyvox 6/23>>> Zosyn 6/22 >>   SIGNIFICANT EVENTS: 6/18 > Presented to ED with [facility]  LINES/TUBES: oett 6/18>>> PICC 6/21 >>     ASSESSMENT / PLAN:  SAH w/ IVH s/p IVC placed in ED 6/18. Aneurysm coiled 6/18. Vasospasm  Plan:   keppra IVC per neuro HHH therapy   Cont nimodipine    Acute hypoxic and hypercarbic respiratory failure in setting of HCAP, and volume overload in setting of worsening renal failure Staph HCAP Also group B strep PNA 6/24 densely consolidated on right. Left w/ increasing airspace disease.  -ventilator support maximized  -  now on NMB and progressively hypercarbic and hypoxic  Plan Font full vent support Cont NMB Cont lasix gtt Day abx #3 zyvox day 2, zosyn # 3 F/u cxr    Circulatory shock, favor sepsis and cardiogenic from acidosis  Plan Cont current pressors Cont lasix gtt (given volume overload) No role for fluid challenge  AKI-->worse..Likely d/t angio and vanc  Fluid and electrolyte d/o: hypernatremia and hyperchloremia, hyperchloremia  Progressive Metabolic acidosis  -lasix gtt started last night Plan:   Cont lasix Add bicarb gtt Repeat K this evening Not a dialysis candidate currently d/t her refractory shock and pre-terminal state.   Dysphagia  Nutrition  Needs  Plan:   Cont TF ppi  Anemia of critical illness plan scds Transfuse per protocol    Hyperglycemia  Plan Trend cbg ssi   DVT prophylaxis: scd SUP: ppi  Diet: tubefeed Activity: br Disposition : icu  DISCUSSION: Sah, in vasospasm.  Now in septic shock, prob exacerbated by acidosis. Have conflicting goals: need volume off for oxygenation but need to be well hydrated for [facility] therapy. She is on high dose pressors, appropriate antibiotics and good supportive care. In spite of doing all this she continues to worsen. I told the family that she may not survive the night. They understand and are in support of DNR status should she arrest but they continue to hope for a miracle. Will cont supportive care. Can think about possible renal consult in am IF she survives the night but currently she can't even be re-positioned with out desaturating w/ hemodynamic effects, thus would could arrest during HD cath placement.  For now continue care as outlined If gets thru the night we can re-evaluate.  The family knows that there is nothing else to offer at this point but remain hopeful  My cc time 63 minutes  Erick Colace ACNP-BC [place] Pager # [phone] OR # [phone] if no answer  Attending Note:  46 year old female with CVA who presents with PNA and severe ARDS.  Patient is also now acidotic and showing no signs of improvement.  On exam, diffuse crackles.  I reviewed CXR myself, diffuse infiltrate noted. Had an extensive conversation with family, after discussion, decision was made to proceed with LCB with pressors and intubation only.  No CPR or cardioversion.  In the meantime, will continue abx and f/u on culture sensitivities.  Continue support otherwise.  PCCM will continue to follow.  The patient is critically ill with multiple organ systems failure and requires high complexity decision making for assessment and support, frequent evaluation and titration  of therapies, application of advanced monitoring technologies and extensive interpretation of multiple databases.   Critical Care Time devoted to patient care services described in this note is  35  Minutes. This time reflects time of care of this signee Dr Jennet Maduro. This critical care time does not reflect procedure time, or teaching time or supervisory time of PA/NP/Med student/Med Resident etc but could involve care discussion time.  Rush Farmer, M.D. [facility] Pulmonary/Critical Care Medicine. Pager: [phone]. After hours pager: [phone].

## 2017-03-09 NOTE — Progress Notes (Signed)
Subjective: Patient reports Remains intubated sedated paralyzed  Objective: Vital signs in last 24 hours: Temp:  [98.7 F (37.1 C)-102.3 F (39.1 C)] 101.9 F (38.8 C) (06/23 1800) Pulse Rate:  [77-96] 81 (06/24 0200) Resp:  [20-38] 32 (06/24 0700) BP: (64-143)/(23-93) 65/52 (06/24 0700) SpO2:  [58 %-98 %] 75 % (06/24 0200) Arterial Line BP: (102-182)/(47-105) 137/56 (06/24 0600) FiO2 (%):  [100 %] 100 % (06/24 0334) Weight:  [121.4 kg (267 lb 10.2 oz)] 121.4 kg (267 lb 10.2 oz) (06/24 0500)  Intake/Output from previous day: 06/23 0701 - 06/24 0700 In: 7598.6 [I.V.:5778.6; NG/GT:570; IV Piggyback:1250] Out: 2943 [Urine:2760; Drains:183] Intake/Output this shift: No intake/output data recorded.  Pupil 6 and sluggish otherwise minimal to no exam secondary to sedatives and paralytics  Lab Results:  Recent Labs  03/08/17 0420 03/09/17 0453  WBC 24.8* 20.6*  HGB 10.9* 8.9*  HCT 32.3* 27.8*  PLT 210 169   BMET  Recent Labs  03/08/17 1601 03/09/17 0453  NA 145 147*  K 2.6* 2.6*  CL 116* 117*  CO2 18* 17*  GLUCOSE 225* 209*  BUN 23* 27*  CREATININE 2.69* 2.72*  CALCIUM 7.0* 7.0*    Studies/Results: Ct Angio Head W Or Wo Contrast  Result Date: 03/07/2017 CLINICAL DATA:  46 year old female who presented with acute ruptured left supraclinoid ICA aneurysm, status post endovascular coiling on 03/03/2017. Worsening mental and clinical status this morning, accompanied by high fever of 104 F. Query vasospasm. EXAM: CT HEAD WITHOUT CONTRAST CT ANGIOGRAPHY HEAD AND NECK TECHNIQUE: Multidetector CT imaging of the head and neck was performed using the standard protocol prior to and during bolus administration of intravenous contrast. Multiplanar CT image reconstructions and MIPs were obtained to evaluate the vascular anatomy. Carotid stenosis measurements (when applicable) are obtained utilizing NASCET criteria, using the distal internal carotid diameter as the denominator.  CONTRAST:  100 mL Isovue 370 COMPARISON:  Chest CT today reported separately. CT head without contrast 03/05/2017. Cerebral angiogram/intervention, and head and neck CTA/CT 03/03/2017. FINDINGS: CT HEAD Brain: Stable right frontal approach EVD. Continued large volume hyperdense intraventricular hemorrhage, worst in the left lateral ventricle. Stable ventricle size and configuration since 03/05/2017. Left inferior frontal gyrus parenchymal hemorrhage size and configuration also is stable since 03/05/2017, 42 x 22 mm AP by transverse. Surrounding edema. Regional mass effect appears stable. No definite loss of gray-white matter differentiation in the hemispheres or posterior fossa. No cortically based acute infarct identified. Calvarium and skull base: Stable right frontal burr hole. No acute osseous abnormality identified. Paranasal sinuses: Visualized paranasal sinuses and mastoids are stable and well pneumatized. Right side nasoenteric tube in place. Orbits: Mild right frontal scalp hematoma. Stable orbit and scalp soft tissues. CTA NECK Skeleton: No acute osseous abnormality identified. Upper chest: Reported on chest CT separately today. Other neck: Endotracheal tube and nasoenteric tubes course as expected through the neck. Small volume of fluid in the pharynx. Otherwise bilateral neck soft tissues are within normal limits. Cervical lymph nodes are stable and within normal limits. Aortic arch: Stable an normal, 3 vessel arch configuration. Right carotid system: Stable and negative aside from mild tortuosity. Left carotid system: Stable and negative aside from mild tortuosity. Vertebral arteries: No proximal subclavian artery stenosis. There is an early origin of the left vertebral artery from the proximal left subclavian. However, that origin is less well visualized today. This might be related to motion artifact, but new left vertebral artery stenosis is difficult to exclude. However, the left vertebral artery  otherwise demonstrates stable enhancement to the skullbase. Right vertebral artery is stable and normal aside from tortuosity. CTA HEAD Posterior circulation: Distal vertebral artery caliber and enhancement is stable. The left vertebral artery functionally terminates in PICA as before. Right PICA origin is patent. Basilar artery caliber and enhancement appears stable. Fetal type bilateral PCA origins, more so the left. SCA origins remain patent. Bilateral PCA branches appear irregular throughout, but stable. There is more intracranial venous contamination on the study today. Anterior circulation: Bilateral ICA siphon caliber air and enhancement is stable to the carotid termini. No siphon stenosis. Increased cavernous sinus venous contamination today. Supraclinoid left ICA coil pack corresponding to the site of the superiorly directed aneurysm seen on the pretreatment CTA. Regional streak artifact. No aneurysmal enhancement is evident. Carotid termini, MCA and ACA origins remain patent. Carotid terminus caliber appear stable from the prior CTA. There is increased venous contrast along the MCA branches today. Proximal ACA A2 branches demonstrate improved patency and caliber. Median artery of the corpus callosum demonstrated. Left MCA branches appear stable in caliber and enhancement. Right MCA branches appear stable to mildly improved in caliber and enhancement. No active contrast extravasation today. Venous sinuses: Patent. Anatomic variants: Early left vertebral artery origin from the subclavian. Distal left vertebral artery functionally terminates in PICA. Fetal type PCA origins suspected. Delayed phase: No abnormal intracranial enhancement identified. Review of the MIP images confirms the above findings IMPRESSION: 1. Satisfactory post coil embolization appearance of the left supraclinoid ICA aneurysm. 2. 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 Left MCA and bilateral PCA Vasa spasm appears unchanged. 3. Questionable new high-grade stenosis at the left vertebral artery origin. The left vertebral artery otherwise appears stable and within normal limits throughout its course. It functionally terminates in PICA. 4. Otherwise stable arterial findings in the neck. 5. Stable CT appearance of the brain since 03/05/2017. Continued large volume intraventricular hemorrhage. Stable EVD and ventricle size. Stable left inferior frontal gyrus hematoma. 6. See also Chest CT today reported separately. Electronically Signed   By: Genevie Ann M.D.   On: 03/07/2017 10:42   Ct Head Wo Contrast  Result Date: 03/07/2017 CLINICAL DATA:  46 year old female who presented with acute ruptured left supraclinoid ICA aneurysm, status post endovascular coiling on 03/03/2017. Worsening mental and clinical status this morning, accompanied by high fever of 104 F. Query vasospasm. EXAM: CT HEAD WITHOUT CONTRAST CT ANGIOGRAPHY HEAD AND NECK TECHNIQUE: Multidetector CT imaging of the head and neck was performed using the standard protocol prior to and during bolus administration of intravenous contrast. Multiplanar CT image reconstructions and MIPs were obtained to evaluate the vascular anatomy. Carotid stenosis measurements (when applicable) are obtained utilizing NASCET criteria, using the distal internal carotid diameter as the denominator. CONTRAST:  100 mL Isovue 370 COMPARISON:  Chest CT today reported separately. CT head without contrast 03/05/2017. Cerebral angiogram/intervention, and head and neck CTA/CT 03/03/2017. FINDINGS: CT HEAD Brain: Stable right frontal approach EVD. Continued large volume hyperdense intraventricular hemorrhage, worst in the left lateral ventricle. Stable ventricle size and configuration since 03/05/2017. Left inferior frontal gyrus parenchymal hemorrhage size and configuration also is stable  since 03/05/2017, 42 x 22 mm AP by transverse. Surrounding edema. Regional mass effect appears stable. No definite loss of gray-white matter differentiation in the hemispheres or posterior fossa. No cortically based acute infarct identified. Calvarium and skull base: Stable right frontal burr  hole. No acute osseous abnormality identified. Paranasal sinuses: Visualized paranasal sinuses and mastoids are stable and well pneumatized. Right side nasoenteric tube in place. Orbits: Mild right frontal scalp hematoma. Stable orbit and scalp soft tissues. CTA NECK Skeleton: No acute osseous abnormality identified. Upper chest: Reported on chest CT separately today. Other neck: Endotracheal tube and nasoenteric tubes course as expected through the neck. Small volume of fluid in the pharynx. Otherwise bilateral neck soft tissues are within normal limits. Cervical lymph nodes are stable and within normal limits. Aortic arch: Stable an normal, 3 vessel arch configuration. Right carotid system: Stable and negative aside from mild tortuosity. Left carotid system: Stable and negative aside from mild tortuosity. Vertebral arteries: No proximal subclavian artery stenosis. There is an early origin of the left vertebral artery from the proximal left subclavian. However, that origin is less well visualized today. This might be related to motion artifact, but new left vertebral artery stenosis is difficult to exclude. However, the left vertebral artery otherwise demonstrates stable enhancement to the skullbase. Right vertebral artery is stable and normal aside from tortuosity. CTA HEAD Posterior circulation: Distal vertebral artery caliber and enhancement is stable. The left vertebral artery functionally terminates in PICA as before. Right PICA origin is patent. Basilar artery caliber and enhancement appears stable. Fetal type bilateral PCA origins, more so the left. SCA origins remain patent. Bilateral PCA branches appear irregular  throughout, but stable. There is more intracranial venous contamination on the study today. Anterior circulation: Bilateral ICA siphon caliber air and enhancement is stable to the carotid termini. No siphon stenosis. Increased cavernous sinus venous contamination today. Supraclinoid left ICA coil pack corresponding to the site of the superiorly directed aneurysm seen on the pretreatment CTA. Regional streak artifact. No aneurysmal enhancement is evident. Carotid termini, MCA and ACA origins remain patent. Carotid terminus caliber appear stable from the prior CTA. There is increased venous contrast along the MCA branches today. Proximal ACA A2 branches demonstrate improved patency and caliber. Median artery of the corpus callosum demonstrated. Left MCA branches appear stable in caliber and enhancement. Right MCA branches appear stable to mildly improved in caliber and enhancement. No active contrast extravasation today. Venous sinuses: Patent. Anatomic variants: Early left vertebral artery origin from the subclavian. Distal left vertebral artery functionally terminates in PICA. Fetal type PCA origins suspected. Delayed phase: No abnormal intracranial enhancement identified. Review of the MIP images confirms the above findings IMPRESSION: 1. Satisfactory post coil embolization appearance of the left supraclinoid ICA aneurysm. 2. 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 Left MCA and bilateral PCA Vasa spasm appears unchanged. 3. Questionable new high-grade stenosis at the left vertebral artery origin. The left vertebral artery otherwise appears stable and within normal limits throughout its course. It functionally terminates in PICA. 4. Otherwise stable arterial findings in the neck. 5. Stable CT appearance of the brain since 03/05/2017. Continued large volume intraventricular  hemorrhage. Stable EVD and ventricle size. Stable left inferior frontal gyrus hematoma. 6. See also Chest CT today reported separately. Electronically Signed   By: Genevie Ann M.D.   On: 03/07/2017 10:42   Ct Angio Neck W Or Wo Contrast  Result Date: 03/07/2017 CLINICAL DATA:  46 year old female who presented with acute ruptured left supraclinoid ICA aneurysm, status post endovascular coiling on 03/03/2017. Worsening mental and clinical status this morning, accompanied by high fever of 104 F. Query vasospasm. EXAM:  CT HEAD WITHOUT CONTRAST CT ANGIOGRAPHY HEAD AND NECK TECHNIQUE: Multidetector CT imaging of the head and neck was performed using the standard protocol prior to and during bolus administration of intravenous contrast. Multiplanar CT image reconstructions and MIPs were obtained to evaluate the vascular anatomy. Carotid stenosis measurements (when applicable) are obtained utilizing NASCET criteria, using the distal internal carotid diameter as the denominator. CONTRAST:  100 mL Isovue 370 COMPARISON:  Chest CT today reported separately. CT head without contrast 03/05/2017. Cerebral angiogram/intervention, and head and neck CTA/CT 03/03/2017. FINDINGS: CT HEAD Brain: Stable right frontal approach EVD. Continued large volume hyperdense intraventricular hemorrhage, worst in the left lateral ventricle. Stable ventricle size and configuration since 03/05/2017. Left inferior frontal gyrus parenchymal hemorrhage size and configuration also is stable since 03/05/2017, 42 x 22 mm AP by transverse. Surrounding edema. Regional mass effect appears stable. No definite loss of gray-white matter differentiation in the hemispheres or posterior fossa. No cortically based acute infarct identified. Calvarium and skull base: Stable right frontal burr hole. No acute osseous abnormality identified. Paranasal sinuses: Visualized paranasal sinuses and mastoids are stable and well pneumatized. Right side nasoenteric tube in place.  Orbits: Mild right frontal scalp hematoma. Stable orbit and scalp soft tissues. CTA NECK Skeleton: No acute osseous abnormality identified. Upper chest: Reported on chest CT separately today. Other neck: Endotracheal tube and nasoenteric tubes course as expected through the neck. Small volume of fluid in the pharynx. Otherwise bilateral neck soft tissues are within normal limits. Cervical lymph nodes are stable and within normal limits. Aortic arch: Stable an normal, 3 vessel arch configuration. Right carotid system: Stable and negative aside from mild tortuosity. Left carotid system: Stable and negative aside from mild tortuosity. Vertebral arteries: No proximal subclavian artery stenosis. There is an early origin of the left vertebral artery from the proximal left subclavian. However, that origin is less well visualized today. This might be related to motion artifact, but new left vertebral artery stenosis is difficult to exclude. However, the left vertebral artery otherwise demonstrates stable enhancement to the skullbase. Right vertebral artery is stable and normal aside from tortuosity. CTA HEAD Posterior circulation: Distal vertebral artery caliber and enhancement is stable. The left vertebral artery functionally terminates in PICA as before. Right PICA origin is patent. Basilar artery caliber and enhancement appears stable. Fetal type bilateral PCA origins, more so the left. SCA origins remain patent. Bilateral PCA branches appear irregular throughout, but stable. There is more intracranial venous contamination on the study today. Anterior circulation: Bilateral ICA siphon caliber air and enhancement is stable to the carotid termini. No siphon stenosis. Increased cavernous sinus venous contamination today. Supraclinoid left ICA coil pack corresponding to the site of the superiorly directed aneurysm seen on the pretreatment CTA. Regional streak artifact. No aneurysmal enhancement is evident. Carotid termini,  MCA and ACA origins remain patent. Carotid terminus caliber appear stable from the prior CTA. There is increased venous contrast along the MCA branches today. Proximal ACA A2 branches demonstrate improved patency and caliber. Median artery of the corpus callosum demonstrated. Left MCA branches appear stable in caliber and enhancement. Right MCA branches appear stable to mildly improved in caliber and enhancement. No active contrast extravasation today. Venous sinuses: Patent. Anatomic variants: Early left vertebral artery origin from the subclavian. Distal left vertebral artery functionally terminates in PICA. Fetal type PCA origins suspected. Delayed phase: No abnormal intracranial enhancement identified. Review of the MIP images confirms the above findings IMPRESSION: 1. Satisfactory post coil embolization appearance of the  left supraclinoid ICA aneurysm. 2. 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 Left MCA and bilateral PCA Vasa spasm appears unchanged. 3. Questionable new high-grade stenosis at the left vertebral artery origin. The left vertebral artery otherwise appears stable and within normal limits throughout its course. It functionally terminates in PICA. 4. Otherwise stable arterial findings in the neck. 5. Stable CT appearance of the brain since 03/05/2017. Continued large volume intraventricular hemorrhage. Stable EVD and ventricle size. Stable left inferior frontal gyrus hematoma. 6. See also Chest CT today reported separately. Electronically Signed   By: Genevie Ann M.D.   On: 03/07/2017 10:42   Ct Chest Wo Contrast  Result Date: 03/07/2017 CLINICAL DATA:  Decreased O2 sats. EXAM: CT CHEST WITHOUT CONTRAST TECHNIQUE: Multidetector CT imaging of the chest was performed following the standard protocol without IV contrast. COMPARISON:  None. FINDINGS: Cardiovascular:  Heart is borderline in size. Aorta appears normal caliber, difficult to visualize due to edema/stranding throughout the mediastinum. Mediastinum/Nodes: Edema/ stranding throughout the mediastinum. Note definite adenopathy. No axillary adenopathy. Endotracheal tube tip is just above the carina. Lungs/Pleura: Airspace disease within both lower lobes, right middle lobe and right upper lobe. Patchy opacities in the left upper lobe. Findings compatible with multifocal pneumonia. Small right pleural effusion. Upper Abdomen: Imaging into the upper abdomen shows no acute findings. Musculoskeletal: Chest wall soft tissues are unremarkable. No acute bony abnormality. IMPRESSION: Multifocal airspace opacities, most confluent throughout the right lung and in the lower lobes concerning for multifocal pneumonia. Small right pleural effusion. Electronically Signed   By: Rolm Baptise M.D.   On: 03/07/2017 10:10   Dg Chest Port 1 View  Result Date: 03/08/2017 CLINICAL DATA:  Hypoxia. EXAM: PORTABLE CHEST 1 VIEW COMPARISON:  Chest radiograph March 08, 2017 at 0525 hours FINDINGS: Cardiac silhouette is mildly enlarged and unchanged. Mediastinal silhouette is normal. Endotracheal tube tip projects 2.2 cm above the carina. RIGHT PICC distal tip projecting cavoatrial junction. Feeding tube past proximal stomach, distal tip out of field-of-view. Increasing interstitial and alveolar airspace opacities with air bronchograms. Small RIGHT pleural effusion. No pneumothorax. Soft tissue planes and included osseous structures are unchanged. IMPRESSION: Cardiomegaly with increasing interstitial and alveolar airspace opacities concerning for pulmonary edema and/or pneumonia. No apparent change in life-support lines. Electronically Signed   By: Elon Alas M.D.   On: 03/08/2017 22:43   Dg Chest Port 1 View  Result Date: 03/08/2017 CLINICAL DATA:  Ventilator dependent. EXAM: PORTABLE CHEST 1 VIEW COMPARISON:  03/07/2017 FINDINGS:  Endotracheal tube terminates 2 cm above the carina. Right PICC terminates over the cavoatrial junction. Feeding tube is incompletely visualized but appears unchanged and likely terminates near the ligament of Treitz. The cardiac silhouette is mildly enlarged. There is persistent extensive airspace opacity throughout the right lung with milder airspace opacity in the left mid and lower lungs, stable to mildly improved on the right and not significantly changed on the left. No sizable pleural effusion or pneumothorax is evident. IMPRESSION: Persistent right greater than left lung airspace opacities concerning for pneumonia, at most minimally improved on the right. Electronically Signed   By: Logan Bores M.D.   On: 03/08/2017 08:14   Dg Chest Port 1 View  Result Date: 03/07/2017 CLINICAL DATA:  Bronchoscopy. EXAM: PORTABLE CHEST 1 VIEW COMPARISON:  CT 03/07/2017.  Chest x-ray 03/07/2017. FINDINGS: Endotracheal tube, feeding tube, right PICC line noted in stable position.  Heart size normal. Bilateral pulmonary infiltrates/edema, right side greater than left again noted. No interim improvement. Small right pleural effusion unchanged. No pneumothorax . IMPRESSION: 1. Lines and tubes in stable position. 2. Bilateral pulmonary infiltrates/edema, right side greater left. No interim change from prior exam. Small right pleural effusion unchanged. Electronically Signed   By: Marcello Moores  Register   On: 03/07/2017 12:28    Assessment/Plan: 46 year old with severe high-grade subarachnoid hemorrhage and cardiopulmonary collapse. Patient with significant difficulties ventilating severely hypoxemic refractory to aggressive pulmonary toilet as well as diuresis from volume overload. Severe hypoxemia takes precedence over her neurologic status and management of her vasospasm. Patient remains on vasopressors however very difficult to maintain systolic pressures higher than 100-110 with continued desat and hypoxemia. Discussions will  need to be had with the family regarding continued aggressiveness of care. Very grave prognosis  LOS: 6 days     Alquan Morrish P 03/09/2017, 7:56 AM

## 2017-03-09 NOTE — Progress Notes (Signed)
[facility] Progress Note Patient Name: Carly [name] DOB: 06/08/1971 MRN: [id number]   Date of Service  03/09/2017  HPI/Events of Note  Low K  eICU Interventions  replaced     Intervention Category Minor Interventions: Electrolytes abnormality - evaluation and management  Mauri Brooklyn, P 03/09/2017, 7:21 PM

## 2017-03-09 NOTE — Progress Notes (Signed)
CRITICAL VALUE ALERT  Critical Value:  Potassium of 2.6  Date & Time Notied:  7915 03/09/18  Provider Notified: Ashok Cordia  Orders Received/Actions taken: Potassium runs ordered.

## 2017-03-10 ENCOUNTER — Inpatient Hospital Stay (HOSPITAL_COMMUNITY): Payer: 59

## 2017-03-10 DIAGNOSIS — I609 Nontraumatic subarachnoid hemorrhage, unspecified: Secondary | ICD-10-CM

## 2017-03-10 DIAGNOSIS — J15211 Pneumonia due to Methicillin susceptible Staphylococcus aureus: Secondary | ICD-10-CM

## 2017-03-10 LAB — BASIC METABOLIC PANEL
ANION GAP: 9 (ref 5–15)
Anion gap: 6 (ref 5–15)
Anion gap: 8 (ref 5–15)
BUN: 30 mg/dL — ABNORMAL HIGH (ref 6–20)
BUN: 23 mg/dL — AB (ref 6–20)
BUN: 22 mg/dL — AB (ref 6–20)
CHLORIDE: 115 mmol/L — AB (ref 101–111)
CHLORIDE: 113 mmol/L — AB (ref 101–111)
CO2: 22 mmol/L (ref 22–32)
CO2: 18 mmol/L — ABNORMAL LOW (ref 22–32)
CO2: 22 mmol/L (ref 22–32)
CREATININE: 2.61 mg/dL — AB (ref 0.44–1.00)
CREATININE: 2.09 mg/dL — AB (ref 0.44–1.00)
CREATININE: 2.05 mg/dL — AB (ref 0.44–1.00)
Calcium: 7.5 mg/dL — ABNORMAL LOW (ref 8.9–10.3)
Calcium: 6 mg/dL — CL (ref 8.9–10.3)
Calcium: 6.9 mg/dL — ABNORMAL LOW (ref 8.9–10.3)
Chloride: 117 mmol/L — ABNORMAL HIGH (ref 101–111)
GFR calc Af Amer: 31 mL/min — ABNORMAL LOW (ref >60)
GFR calc Af Amer: 32 mL/min — ABNORMAL LOW (ref >60)
GFR calc non Af Amer: 21 mL/min — ABNORMAL LOW (ref >60)
GFR, EST AFRICAN AMERICAN: 24 mL/min — AB (ref >60)
GFR, EST NON AFRICAN AMERICAN: 27 mL/min — AB (ref >60)
GFR, EST NON AFRICAN AMERICAN: 28 mL/min — AB (ref >60)
GLUCOSE: 295 mg/dL — AB (ref 65–99)
GLUCOSE: 220 mg/dL — AB (ref 65–99)
Glucose, Bld: 193 mg/dL — ABNORMAL HIGH (ref 65–99)
POTASSIUM: 2.7 mmol/L — AB (ref 3.5–5.1)
Potassium: 2.2 mmol/L — CL (ref 3.5–5.1)
Potassium: 2.7 mmol/L — CL (ref 3.5–5.1)
SODIUM: 146 mmol/L — AB (ref 135–145)
SODIUM: 141 mmol/L (ref 135–145)
SODIUM: 143 mmol/L (ref 135–145)

## 2017-03-10 LAB — BLOOD GAS, ARTERIAL
Acid-base deficit: 4.4 mmol/L — ABNORMAL HIGH (ref 0.0–2.0)
Bicarbonate: 20 mmol/L (ref 20.0–28.0)
Drawn by: 406621
FIO2: 1
LHR: 18 {breaths}/min
O2 SAT: 92 %
PEEP/CPAP: 16 cmH2O
Patient temperature: 100.5
VT: 400 mL
pCO2 arterial: 37.7 mmHg (ref 32.0–48.0)
pH, Arterial: 7.35 (ref 7.350–7.450)
pO2, Arterial: 69.8 mmHg — ABNORMAL LOW (ref 83.0–108.0)

## 2017-03-10 LAB — GLUCOSE, CAPILLARY
GLUCOSE-CAPILLARY: 175 mg/dL — AB (ref 65–99)
GLUCOSE-CAPILLARY: 152 mg/dL — AB (ref 65–99)
Glucose-Capillary: 153 mg/dL — ABNORMAL HIGH (ref 65–99)
Glucose-Capillary: 206 mg/dL — ABNORMAL HIGH (ref 65–99)
Glucose-Capillary: 214 mg/dL — ABNORMAL HIGH (ref 65–99)
Glucose-Capillary: 218 mg/dL — ABNORMAL HIGH (ref 65–99)
Glucose-Capillary: 238 mg/dL — ABNORMAL HIGH (ref 65–99)

## 2017-03-10 LAB — POCT I-STAT 3, ART BLOOD GAS (G3+)
Acid-base deficit: 10 mmol/L — ABNORMAL HIGH (ref 0.0–2.0)
BICARBONATE: 17.8 mmol/L — AB (ref 20.0–28.0)
O2 Saturation: 74 %
PCO2 ART: 45.7 mmHg (ref 32.0–48.0)
TCO2: 19 mmol/L (ref 0–100)
pH, Arterial: 7.198 — CL (ref 7.350–7.450)
pO2, Arterial: 48 mmHg — ABNORMAL LOW (ref 83.0–108.0)

## 2017-03-10 LAB — CBC
HCT: 25.8 % — ABNORMAL LOW (ref 36.0–46.0)
HEMOGLOBIN: 8.3 g/dL — AB (ref 12.0–15.0)
MCH: 26.6 pg (ref 26.0–34.0)
MCHC: 32.2 g/dL (ref 30.0–36.0)
MCV: 82.7 fL (ref 78.0–100.0)
PLATELETS: 159 10*3/uL (ref 150–400)
RBC: 3.12 MIL/uL — AB (ref 3.87–5.11)
RDW: 16.2 % — ABNORMAL HIGH (ref 11.5–15.5)
WBC: 19.2 10*3/uL — AB (ref 4.0–10.5)

## 2017-03-10 LAB — PATHOLOGIST SMEAR REVIEW

## 2017-03-10 MED ORDER — CEFAZOLIN SODIUM-DEXTROSE 2-4 GM/100ML-% IV SOLN
2.0000 g | Freq: Three times a day (TID) | INTRAVENOUS | Status: DC
  Administered 2017-03-10 – 2017-03-25 (×45): 2 g via INTRAVENOUS
  Filled 2017-03-10 (×47): qty 100

## 2017-03-10 MED ORDER — POTASSIUM CHLORIDE 20 MEQ/15ML (10%) PO SOLN
40.0000 meq | Freq: Once | ORAL | Status: AC
  Administered 2017-03-10: 40 meq
  Filled 2017-03-10: qty 30

## 2017-03-10 MED ORDER — VITAL HIGH PROTEIN PO LIQD
1000.0000 mL | ORAL | Status: DC
  Administered 2017-03-10: 1000 mL
  Administered 2017-03-11 (×6)
  Administered 2017-03-11: 1000 mL

## 2017-03-10 MED ORDER — CALCIUM GLUCONATE 10 % IV SOLN
1.0000 g | Freq: Once | INTRAVENOUS | Status: AC
  Administered 2017-03-10: 1 g via INTRAVENOUS
  Filled 2017-03-10: qty 10

## 2017-03-10 MED ORDER — POTASSIUM CHLORIDE 10 MEQ/50ML IV SOLN
10.0000 meq | INTRAVENOUS | Status: AC
  Administered 2017-03-10 – 2017-03-11 (×6): 10 meq via INTRAVENOUS
  Filled 2017-03-10 (×6): qty 50

## 2017-03-10 MED ORDER — POTASSIUM CHLORIDE 10 MEQ/50ML IV SOLN
10.0000 meq | INTRAVENOUS | Status: AC
  Administered 2017-03-10 (×6): 10 meq via INTRAVENOUS
  Filled 2017-03-10 (×6): qty 50

## 2017-03-10 NOTE — Progress Notes (Signed)
Pt remains intubated, sedated, paralyzed. No issues overnight. Made DNR over weekend.  EXAM:  BP 125/68   Pulse 74   Temp 99.2 F (37.3 C) (Rectal)   Resp (!) 32   Ht 5\' 7"  (1.702 m)   Wt 130.4 kg (287 lb 7.7 oz)   LMP  (LMP Unknown) Comment: pt is unresponsive, and is unable to communicate  SpO2 (!) 77%   BMI 45.03 kg/m   No exam, intubated, sedated, paralyzed. 0/4 twitches  TCD reviewed, not indicative of spasm  IMPRESSION:  46 y.o. female [place] d# 8 s/p LICA coiling.  Hypoxic resp failure / ARDS, severe AKI appears improving  PLAN: - Cont vent mgmt/sedation/paralytic to optimize oxygenation per PCCM.  - Attempt to maintain hypertension - 125mmHg

## 2017-03-10 NOTE — Progress Notes (Signed)
Transcranial Doppler  Date POD PCO2 HCT BP  MCA ACA PCA OPHT SIPH VERT Basilar  03/04/17 vs     Right  Left   57  71   -45  -45   53  67   16  14   *  51   *  *   *  *    03/07/17 vs/ je     Right  Left   *  *   *  *   *  *   12  8   *  *   *  *   *      6/25 je  45.7 25.8 135/63 Right  Left   63  85   -64  -68   *  *   21  25   -66  -92   -38  -50   *            Right  Left                                             Right  Left                                            Right  Left                                            Right  Left                                        MCA = Middle Cerebral Artery      OPHT = Opthalmic Artery     BASILAR = Basilar Artery   ACA = Anterior Cerebral Artery     SIPH = Carotid Siphon PCA = Posterior Cerebral Artery   VERT = Verterbral Artery                 Left Lindegaard ratio 3.07 Right lindegaard ratio 1.63 Normal MCA = 62+\-12 ACA = 50+\-12 PCA = 42+\-23   03/04/17 Unable to obtain the vertebral due to ET tube stabilizer 6/22 very limited study due to patient constant shaking/ tensing up. je/vs 6/25 very poor windows- had to increase gain for all images to visualize waveform. Patient's BP has be up and down. Unsure of accuracy due to abnormalities of BP. Lindegaard ratio: right 1.5 left 1.2. JE    03/04/2017 6:20 PM

## 2017-03-10 NOTE — Progress Notes (Signed)
Nutrition Follow-up  DOCUMENTATION CODES:   Obesity unspecified  INTERVENTION:   Trickle TF Vital High Protein @ 20 ml/hr (480 ml/day) Provides: 480 kcal and 42 grams protein  As able would recommend advance to goal Vital High Protein @ 50 ml/hr (1200 ml/day) 30 ml Prostat BID Provides: 1400 kcal, 135 grams protein, 1003 ml free water.    NUTRITION DIAGNOSIS:   Inadequate oral intake related to inability to eat as evidenced by NPO status. Ongoing.   GOAL:   Patient will meet greater than or equal to 90% of their needs Progressing.   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  Pt discussed during ICU rounds and with RN.  Patient is currently intubated on ventilator support  Pt worsened 6/23 in septic shock exacerbated by acidosis, severe ARDS with PNA requiring multiple pressors, still in fluid overload.  TF started 6/21, decreased to 30 ml/hr 6/22 due to hypophosphatemia , then d/c'ed 6/23 due to shock.   Medications reviewed and include: colace, MVI Drips: lasix, levophed, neo-synephrine, vasopressin, Na bicarb @ 75 ml/hr, KCl, nimbex Labs reviewed: K+ 2.7 (L), phosphorus 1.9-1.8-1.9-<1.0-1.7-2.1 (6/22) CBG's: 206-175 Pt is positive 13.5 L and up 49 lb since admission  Diet Order:  Diet NPO time specified  Skin:  Reviewed, no issues  Last BM:  unknown - no bm this admission  Height:   Ht Readings from Last 1 Encounters:  03/03/17 5\' 7"  (1.702 m)    Weight:   Wt Readings from Last 1 Encounters:  03/10/17 287 lb 7.7 oz (130.4 kg)    Ideal Body Weight:  61.3 kg  BMI:  Body mass index is 45.03 kg/m.  Estimated Nutritional Needs:   Kcal:  2248-2500  Protein:  >/= 122 grams  Fluid:  > 2  L/day  EDUCATION NEEDS:   No education needs identified at this time  [place], [place], [place] Pager [phone] After Hours Pager

## 2017-03-10 NOTE — Progress Notes (Addendum)
Pharmacy Antibiotic Note  Carly [name] is a 46 y.o. female admitted on 03/03/2017 with sepsis on day #4 of abx. Vancomycin was changed to Zyvox over the weekend due to worsening AKI. Now respiratory cultures showing MSSA.  Zosyn 6/22 >> Vancomycin 6/22 >> 6/23  Linezolid 6/23 >   6/18 MRSA PCR neg 6/21 TA > abundant staph aureus 6/22 Resp Cx > MSSA 6/22 Blood x 2 > 6/22 BAL > MSSA  Plan: -Zosyn 3.375 g IV q8h -Zyvox 600 mg IV q12h -Recommend discontinuing Zyvox and deescalating Zosyn now that cultures are MSSA -Monitor renal fx, cultures, duration of therapy  Height: _0  (170.2 cm) Weight: 287 lb 7.7 oz (130.4 kg) IBW/kg (Calculated) : 61.6  Temp (24hrs), Avg:99.2 F (37.3 C), Min:99 F (37.2 C), Max:99.7 F (37.6 C)   Recent Labs Lab 03/06/17 0447  03/07/17 7741  03/08/17 0420 03/08/17 1601 03/09/17 0453 03/09/17 1819 03/10/17 0633  WBC 14.9*  --  15.0*  --  24.8*  --  20.6*  --  19.2*  CREATININE 0.94  < >  --   < > 1.96* 2.69* 2.72* 2.69* 2.61*  < > = values in this interval not displayed.  Estimated Creatinine Clearance: 37.5 mL/min (A) (by C-G formula based on SCr of 2.61 mg/dL (H)).      Hughes Better, PharmD, BCPS Clinical Pharmacist 03/10/2017 9:10 AM    Addendum -Stop Zyvox and Zosyn  -Begin cefazolin 2g/8h -Monitor renal fx closely   Harvel Quale  03/10/2017  9:51 AM

## 2017-03-10 NOTE — Progress Notes (Signed)
PULMONARY / CRITICAL CARE MEDICINE   Name: Carly [name] MRN: [id number] DOB: [birth year]    ADMISSION DATE:  03/03/2017 CONSULTATION DATE:  6/18  REFERRING MD:  Dr. Ralene Bathe EDP  CHIEF COMPLAINT: Headache  Brief:   46 year old female with PMH as below, which is significant for HTN, GERD, and migraines admitted 6/18 for HA & focal seizure activity. CT head SAH w/ associated IVH in setting of presumed ruptured aneurysm. Developed progressive decreased LOC, vomited, postured. Intubated for airway protection and IVC placed per neuro-surg.   SUBJECTIVE:  Remains Critically ill, sedated, paralyzed ON 3 pressors + lasix gtt  VITAL SIGNS: BP (!) 49/21   Pulse 74   Temp 99.7 F (37.6 C) (Rectal)   Resp (!) 23   Ht 5\' 7"  (1.702 m)   Wt 287 lb 7.7 oz (130.4 kg)   LMP  (LMP Unknown) Comment: pt is unresponsive, and is unable to communicate  SpO2 (!) 70%   BMI 45.03 kg/m   HEMODYNAMICS:    VENTILATOR SETTINGS: Vent Mode: PRVC FiO2 (%):  [100 %] 100 % Set Rate:  [32 bmp] 32 bmp Vt Set:  [400 mL] 400 mL PEEP:  [16 cmH20] 16 cmH20 Plateau Pressure:  [28 cmH20-33 cmH20] 33 cmH20  INTAKE / OUTPUT: I/O last 3 completed shifts: In: 11314.7 [I.V.:9749.2; NG/GT:153; IV Piggyback:1412.5] Out: 2870 [Urine:2565; Drains:305]  Physical Exam  Constitutional: She appears well-developed and well-nourished. No distress. She is sedated and intubated.  HENT:  Head: Normocephalic and atraumatic.  Cardiovascular: Normal rate, regular rhythm and normal heart sounds.   Pulmonary/Chest: She is intubated. No respiratory distress. She has decreased breath sounds.  Abdominal: Soft. Normal appearance and bowel sounds are normal. There is no tenderness.  Genitourinary:  Genitourinary Comments: Clear yellow but minimal urine output   Neurological: GCS eye subscore is 1. GCS verbal subscore is 1. GCS motor subscore is 1.  Deep sedation & paralysed  Skin: She is not diaphoretic.   LABS:  BMET  Recent  Labs Lab 03/09/17 0453 03/09/17 1819 03/10/17 0633  NA 147* 143 146*  K 2.6* 2.5* 2.7*  CL 117* 116* 115*  CO2 17* 18* 22  BUN 27* 29* 30*  CREATININE 2.72* 2.69* 2.61*  GLUCOSE 209* 183* 193*    Electrolytes  Recent Labs Lab 03/06/17 1748 03/07/17 0532 03/07/17 1630  03/09/17 0453 03/09/17 1819 03/10/17 0633  CALCIUM  --  7.8*  --   < > 7.0* 7.3* 7.5*  MG 1.8 2.0 1.8  --  2.0  --   --   PHOS <1.0* 1.7* 2.1*  --   --   --   --   < > = values in this interval not displayed.  CBC  Recent Labs Lab 03/08/17 0420 03/09/17 0453 03/10/17 0633  WBC 24.8* 20.6* 19.2*  HGB 10.9* 8.9* 8.3*  HCT 32.3* 27.8* 25.8*  PLT 210 169 159    Coag's  Recent Labs Lab 03/03/17 2041  APTT 31  INR 1.07    Sepsis Markers  Recent Labs Lab 03/06/17 1054 03/07/17 0805 03/08/17 0420  PROCALCITON <0.10 1.28 13.36    ABG  Recent Labs Lab 03/08/17 2250 03/09/17 0351 03/10/17 0339  PHART 7.232* 7.110* 7.198*  PCO2ART 35.0 50.1* 45.7  PO2ART 46.0* 43.5* 48.0*    Liver Enzymes  Recent Labs Lab 03/03/17 1214 03/08/17 1601 03/09/17 0453  AST 45* 58* 102*  ALT 44 10* 57*  ALKPHOS 77 85 86  BILITOT 0.7 1.2 1.1  ALBUMIN  3.9 2.4* 2.1*    Cardiac Enzymes  Recent Labs Lab 03/04/17 1202 03/05/17 0230 03/07/17 1043  TROPONINI 0.13* 0.07* 0.36*    Glucose  Recent Labs Lab 03/09/17 1112 03/09/17 1543 03/09/17 1936 03/10/17 0014 03/10/17 0428 03/10/17 0747  GLUCAP 219* 166* 185* 214* 206* 175*    Imaging Dg Chest Port 1 View  Result Date: 03/10/2017 CLINICAL DATA:  Ruptured cerebral aneurysm with subarachnoid hemorrhage ; acute respiratory failure with hypoxia. Intubated patient. EXAM: PORTABLE CHEST 1 VIEW COMPARISON:  Portable chest x-ray of March 09, 2017 FINDINGS: The lungs are mildly hypoinflated. Confluent alveolar opacity persists throughout the right lung with areas of similar density in the left mid and lower lung. The cardiac silhouette is  top-normal in size. The pulmonary vascularity is not clearly engorged. The endotracheal tube tip lies 4.3 cm above the carina. The feeding tube tip projects over the proximal jejunum in the left upper quadrant. IMPRESSION: Bilateral airspace opacities most compatible with pneumonia greatest on the right. No overt CHF. One cannot exclude small bilateral pleural effusions. Electronically Signed   By: David  Martinique M.D.   On: 03/10/2017 07:38   STUDIES:  CTA 6/18: Ruptured intracranial aneurysm with hemorrhage into the left inferior frontal gyrus and then into the ventricular system via of the left lateral ventricle. 2. Positive for active extravasation of hemorrhage from a superiorly directed left supraclinoid ICA aneurysm (~5 x 3 mm).4. 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 5. Mild ventriculomegaly/transependymal edema. Absence subarachnoid hemorrhage at this time, and basilar cisterns remain patent. 6. Tortuous carotid arteries. No atherosclerosis or stenosis CT Head 6/20: Acute intra-axial hemorrhage at the anterior inferior left frontal lobe again seen, relatively stable measuring 4.1 x 2.2 cm. Similar surrounding low-density vasogenic edema. Intraventricular extension with blood in the lateral, third, and fourth ventricles, similar to previous. There has been interval placement of a right frontal approach ventricular catheter with tip seen terminating at the left thalamus. Ventricular dilatation relatively similar, with asymmetric dilatation of the left lateral ventricle as compared to the right. Basilar cisterns remain patent. No significant midline shift. No new hemorrhage. Streak artifact from coiled aneurysm noted. No acute large vessel territory infarct. No extra-axial fluid collection. CXR 6/22 > New extensive right lung airspace diease  CTA Head/Neck 6/22 >>1. Satisfactory post coil embolization appearance of the left supraclinoid ICA aneurysm. 2.  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 Left MCA and bilateral PCA Vasa spasm appears unchanged. 3. Questionable new high-grade stenosis at the left vertebral artery origin. The left vertebral artery otherwise appears stable and within normal limits throughout its course. It functionally terminates in PICA. 4. Otherwise stable arterial findings in the neck. CT Chest 6/22 >>  Multifocal airspace opacities, most confluent throughout the right lung and in the lower lobes concerning for multifocal pneumonia.  CULTURES: Sputum 6/21 >>>MSSA Sputum 6/22 >>MSSA Blood 6/22 >>   ANTIBIOTICS: Vancomycin 6/22 >>6/23 zyvox 6/23>>> 6/25 Zosyn 6/22 >> 6/25  SIGNIFICANT EVENTS: 6/18 > Presented to ED with [facility]  LINES/TUBES: oett 6/18>>> PICC 6/21 >>     ASSESSMENT / PLAN:  Acute encephalopathy SAH w/ IVH s/p IVC placed in ED 6/18. Aneurysm coiled 6/18. Vasospasm  Plan:   keppra IVC per neuro Ct HHH therapy, although Rx of ARDS supercedes vasospasm   Cont nimodipine    ARDS/ Acute hypoxic and hypercarbic respiratory failure in  setting of HCAP, and volume overload in setting of worsening renal failure  6/24 densely consolidated on right. Left w/ increasing airspace disease.  -now on NMB and progressively hypercarbic and hypoxic  Plan Full vent support - ct lung protective Tv with PEEP +16 Cont NMB   Staph HCAP Also group B strep PNA  - dc vanc/ zosyn Change to ancef  Circulatory shock, favor sepsis and cardiogenic from acidosis  Plan Cont current pressors    AKI-->Likely d/t contrast and vanc  Progressive Metabolic acidosis   Plan:   Increase lasix gtt (given volume overload) -aim for equal balance Ct bicarb gtt Replace & repeat K  Not a dialysis candidate   Dysphagia  Nutrition Needs  Plan:   Trickle TF ppi  Anemia of  critical illness plan scds Transfuse per protocol  SCDs  Hyperglycemia  Plan Trend cbg ssi  Family - DNR issued, sister updated 6/25   DISCUSSION: SAH with vasospasm.  Also in septic shock, prob exacerbated by acidosis.  Volume management is challenging - need negative for ARDS but need to be well hydrated for [facility] therapy - will try to keep even with lasix gtt     My cc time 45 minutes  Kara Mead MD. [facility].  Pulmonary & Critical care Pager [phone] If no response call [phone]   03/10/2017

## 2017-03-11 ENCOUNTER — Inpatient Hospital Stay (HOSPITAL_COMMUNITY): Payer: 59

## 2017-03-11 LAB — POCT I-STAT 3, ART BLOOD GAS (G3+)
Acid-Base Excess: 9 mmol/L — ABNORMAL HIGH (ref 0.0–2.0)
BICARBONATE: 34 mmol/L — AB (ref 20.0–28.0)
O2 Saturation: 86 %
TCO2: 36 mmol/L (ref 0–100)
pCO2 arterial: 51.4 mmHg — ABNORMAL HIGH (ref 32.0–48.0)
pH, Arterial: 7.43 (ref 7.350–7.450)
pO2, Arterial: 52 mmHg — ABNORMAL LOW (ref 83.0–108.0)

## 2017-03-11 LAB — BASIC METABOLIC PANEL
Anion gap: 13 (ref 5–15)
Anion gap: 11 (ref 5–15)
BUN: 20 mg/dL (ref 6–20)
BUN: 13 mg/dL (ref 6–20)
CALCIUM: 8 mg/dL — AB (ref 8.9–10.3)
CHLORIDE: 100 mmol/L — AB (ref 101–111)
CO2: 29 mmol/L (ref 22–32)
CO2: 37 mmol/L — AB (ref 22–32)
CREATININE: 1.69 mg/dL — AB (ref 0.44–1.00)
CREATININE: 1.19 mg/dL — AB (ref 0.44–1.00)
Calcium: 8.1 mg/dL — ABNORMAL LOW (ref 8.9–10.3)
Chloride: 106 mmol/L (ref 101–111)
GFR calc non Af Amer: 35 mL/min — ABNORMAL LOW (ref >60)
GFR calc non Af Amer: 53 mL/min — ABNORMAL LOW (ref >60)
GFR, EST AFRICAN AMERICAN: 41 mL/min — AB (ref >60)
Glucose, Bld: 177 mg/dL — ABNORMAL HIGH (ref 65–99)
Glucose, Bld: 161 mg/dL — ABNORMAL HIGH (ref 65–99)
Potassium: 2.4 mmol/L — CL (ref 3.5–5.1)
Potassium: 2.9 mmol/L — ABNORMAL LOW (ref 3.5–5.1)
Sodium: 148 mmol/L — ABNORMAL HIGH (ref 135–145)
Sodium: 148 mmol/L — ABNORMAL HIGH (ref 135–145)

## 2017-03-11 LAB — MAGNESIUM: MAGNESIUM: 1.5 mg/dL — AB (ref 1.7–2.4)

## 2017-03-11 LAB — BLOOD GAS, ARTERIAL
Acid-Base Excess: 4.3 mmol/L — ABNORMAL HIGH (ref 0.0–2.0)
BICARBONATE: 29.9 mmol/L — AB (ref 20.0–28.0)
DRAWN BY: 398981
FIO2: 100
MECHVT: 400 mL
O2 Saturation: 97.5 %
PATIENT TEMPERATURE: 97.7
PCO2 ART: 57 mmHg — AB (ref 32.0–48.0)
PEEP: 16 cmH2O
PO2 ART: 106 mmHg (ref 83.0–108.0)
RATE: 32 resp/min
pH, Arterial: 7.337 — ABNORMAL LOW (ref 7.350–7.450)

## 2017-03-11 LAB — GLUCOSE, CAPILLARY
GLUCOSE-CAPILLARY: 135 mg/dL — AB (ref 65–99)
GLUCOSE-CAPILLARY: 141 mg/dL — AB (ref 65–99)
Glucose-Capillary: 289 mg/dL — ABNORMAL HIGH (ref 65–99)
Glucose-Capillary: 119 mg/dL — ABNORMAL HIGH (ref 65–99)
Glucose-Capillary: 112 mg/dL — ABNORMAL HIGH (ref 65–99)
Glucose-Capillary: 157 mg/dL — ABNORMAL HIGH (ref 65–99)

## 2017-03-11 LAB — CBC
HEMATOCRIT: 26.9 % — AB (ref 36.0–46.0)
Hemoglobin: 8.9 g/dL — ABNORMAL LOW (ref 12.0–15.0)
MCH: 27 pg (ref 26.0–34.0)
MCHC: 33.1 g/dL (ref 30.0–36.0)
MCV: 81.5 fL (ref 78.0–100.0)
Platelets: 136 10*3/uL — ABNORMAL LOW (ref 150–400)
RBC: 3.3 MIL/uL — ABNORMAL LOW (ref 3.87–5.11)
RDW: 15.7 % — AB (ref 11.5–15.5)
WBC: 20.4 10*3/uL — ABNORMAL HIGH (ref 4.0–10.5)

## 2017-03-11 LAB — CALCIUM, IONIZED: Calcium, Ionized, Serum: 3.9 mg/dL — ABNORMAL LOW (ref 4.5–5.6)

## 2017-03-11 LAB — PHOSPHORUS: PHOSPHORUS: 2.1 mg/dL — AB (ref 2.5–4.6)

## 2017-03-11 MED ORDER — POTASSIUM CHLORIDE 20 MEQ/15ML (10%) PO SOLN
40.0000 meq | ORAL | Status: AC
  Administered 2017-03-11 (×2): 40 meq
  Filled 2017-03-11 (×2): qty 30

## 2017-03-11 MED ORDER — POTASSIUM CHLORIDE 10 MEQ/50ML IV SOLN
10.0000 meq | INTRAVENOUS | Status: AC
  Administered 2017-03-11 (×6): 10 meq via INTRAVENOUS
  Filled 2017-03-11 (×5): qty 50

## 2017-03-11 MED ORDER — DOCUSATE SODIUM 50 MG/5ML PO LIQD: 100.0000 mg | Freq: Every day | ORAL | Status: DC | PRN

## 2017-03-11 MED ORDER — MAGNESIUM SULFATE 2 GM/50ML IV SOLN
2.0000 g | Freq: Once | INTRAVENOUS | Status: AC
  Administered 2017-03-11: 2 g via INTRAVENOUS
  Filled 2017-03-11: qty 50

## 2017-03-11 MED ORDER — POTASSIUM CHLORIDE 10 MEQ/50ML IV SOLN
10.0000 meq | INTRAVENOUS | Status: AC
  Administered 2017-03-11 – 2017-03-12 (×6): 10 meq via INTRAVENOUS
  Filled 2017-03-11 (×6): qty 50

## 2017-03-11 NOTE — Progress Notes (Addendum)
No issues overnight.   EXAM:  BP (!) 159/77   Pulse 78   Temp 99.4 F (37.4 C) (Axillary)   Resp (!) 26   Ht 5\' 7"  (1.702 m)   Wt 122.5 kg (270 lb 1 oz)   LMP  (LMP Unknown) Comment: pt is unresponsive, and is unable to communicate  SpO2 100%   BMI 42.30 kg/m   Intubated, sedated, paralyzed. EVD in place, functional with bloody CSF  IMPRESSION:  46 y.o. female [place] d#9 s/p LICA aneurysm coiling. ARDS/VAP/Pulm edema appears to be slightly improving with some diuresis / net negative fluid balance. I think adequate oxygenation will certainly take precedence over hyperdynamic (hypervolemic) therapy especially considering her most recent TCD is not overly concerning for vasospasm.   PLAN: - Cont vent mgmt per PCCM. Diuresis / negative fluid balance as necessary to maintain adequate PaO2 - Nimotop - Attempt to maintain hypertension if possible.

## 2017-03-11 NOTE — Progress Notes (Signed)
CRITICAL VALUE ALERT  Critical Value:  K+ 2.7  Date & Time Notied:  03/10/17 2014   Provider Notified: Dr. Halford Chessman  Orders Received/Actions taken: New orders for IV K+, see MAR

## 2017-03-11 NOTE — Progress Notes (Signed)
Inpatient Diabetes Program Recommendations  AACE/ADA: New Consensus Statement on Inpatient Glycemic Control (2015)  Target Ranges:  Prepandial:   less than 140 mg/dL      Peak postprandial:   less than 180 mg/dL (1-2 hours)      Critically ill patients:  140 - 180 mg/dL   Results for Carly [name], Carly [name] (MRN [id number]) as of 03/11/2017 08:54  Ref. Range 03/10/2017 23:40 03/11/2017 04:37 03/11/2017 07:44  Glucose-Capillary Latest Ref Range: 65 - 99 mg/dL 238 (H) 289 (H) 119 (H)    Home DM Meds: None  Current Insulin Orders: Novolog Sensitive Correction Scale/ SSI (0-9 units) Q4 hours       MD- Note patient currently Intubated, Paralyzed, Just started trickle tube feedings.  Having glucose levels >200 mg/dl.  Please consider starting ICU Glycemic Control Protocol.      --Will follow patient during hospitalization--  Wyn Quaker RN, MSN, CDE Diabetes Coordinator Inpatient Glycemic Control Team Team Pager: [phone] (8a-5p)

## 2017-03-11 NOTE — Progress Notes (Signed)
eLink Physician-Brief Progress Note Patient Name: ADRIEN DIETZMAN DOB: 11-10-1970 MRN: [id number]   Date of Service  03/11/2017  HPI/Events of Note    eICU Interventions  KCL 28mEq given Lasix gtt decreased to 3mg  /h      Intervention Category Intermediate Interventions: Electrolyte abnormality - evaluation and management  BYRUM,ROBERT S. 03/11/2017, 9:00 PM

## 2017-03-11 NOTE — Progress Notes (Signed)
PULMONARY / CRITICAL CARE MEDICINE   Name: Carly [name] MRN: [id number] DOB: [birth year]    ADMISSION DATE:  03/03/2017 CONSULTATION DATE:  6/18  REFERRING MD:  Dr. Ralene Bathe EDP  CHIEF COMPLAINT: Headache  Brief:   46 year old female with PMH as below, which is significant for HTN, GERD, and migraines admitted 6/18 for HA & focal seizure activity. CT head SAH w/ associated IVH in setting of presumed ruptured aneurysm. Developed progressive decreased LOC, vomited, postured. Intubated for airway protection and IVC placed per neuro-surg.   SUBJECTIVE:  Remains Critically ill, sedated, paralyzed afebrile On 3 pressors  Diuresing well on lasix gtt  VITAL SIGNS: BP (!) 116/58   Pulse 78   Temp 97.7 F (36.5 C) (Axillary)   Resp (!) 32   Ht 5\' 7"  (1.702 m)   Wt 270 lb 1 oz (122.5 kg)   LMP  (LMP Unknown) Comment: pt is unresponsive, and is unable to communicate  SpO2 100%   BMI 42.30 kg/m   HEMODYNAMICS:    VENTILATOR SETTINGS: Vent Mode: PRVC FiO2 (%):  [100 %] 100 % Set Rate:  [32 bmp] 32 bmp Vt Set:  [400 mL] 400 mL PEEP:  [16 cmH20] 16 cmH20 Plateau Pressure:  [33 cmH20-38 cmH20] 36 cmH20  INTAKE / OUTPUT: I/O last 3 completed shifts: In: 10703.7 [I.V.:9238.7; Other:50; NG/GT:340; IV Piggyback:1075] Out: 10272 [ZDGUY:40347; Drains:284]  Physical Exam  Constitutional: She appears well-developed and well-nourished. No distress. She is sedated, chemically paralyzed and intubated.  HENT:  Head: Normocephalic and atraumatic.  Cardiovascular: Normal rate, regular rhythm and normal heart sounds.   Pulmonary/Chest: She is intubated. No respiratory distress. She has decreased breath sounds.  Abdominal: Soft. Normal appearance and bowel sounds are normal. There is no tenderness.  Genitourinary:  Genitourinary Comments: Clear yellow but minimal urine output   Musculoskeletal: She exhibits edema.  Neurological: GCS eye subscore is 1. GCS verbal subscore is 1. GCS motor  subscore is 1.  Deep sedation & paralysed  Skin: She is not diaphoretic.   LABS:  BMET  Recent Labs Lab 03/10/17 1015 03/10/17 1823 03/11/17 0530  NA 141 143 148*  K 2.2* 2.7* 2.4*  CL 117* 113* 106  CO2 18* 22 29  BUN 23* 22* 20  CREATININE 2.09* 2.05* 1.69*  GLUCOSE 295* 220* 177*    Electrolytes  Recent Labs Lab 03/07/17 0532 03/07/17 1630  03/09/17 0453  03/10/17 1015 03/10/17 1823 03/11/17 0530  CALCIUM 7.8*  --   < > 7.0*  < > 6.0* 6.9* 8.0*  MG 2.0 1.8  --  2.0  --   --   --  1.5*  PHOS 1.7* 2.1*  --   --   --   --   --  2.1*  < > = values in this interval not displayed.  CBC  Recent Labs Lab 03/09/17 0453 03/10/17 0633 03/11/17 0530  WBC 20.6* 19.2* 20.4*  HGB 8.9* 8.3* 8.9*  HCT 27.8* 25.8* 26.9*  PLT 169 159 136*    Coag's No results for input(s): APTT, INR in the last 168 hours.  Sepsis Markers  Recent Labs Lab 03/06/17 1054 03/07/17 0805 03/08/17 0420  PROCALCITON <0.10 1.28 13.36    ABG  Recent Labs Lab 03/09/17 0351 03/10/17 0339 03/11/17 0555  PHART 7.110* 7.198* 7.337*  PCO2ART 50.1* 45.7 57.0*  PO2ART 43.5* 48.0* 106    Liver Enzymes  Recent Labs Lab 03/08/17 1601 03/09/17 0453  AST 58* 102*  ALT 10* 57*  ALKPHOS 85 86  BILITOT 1.2 1.1  ALBUMIN 2.4* 2.1*    Cardiac Enzymes  Recent Labs Lab 03/04/17 1202 03/05/17 0230 03/07/17 1043  TROPONINI 0.13* 0.07* 0.36*    Glucose  Recent Labs Lab 03/10/17 1202 03/10/17 1516 03/10/17 2002 03/10/17 2340 03/11/17 0437 03/11/17 0744  GLUCAP 152* 153* 218* 238* 289* 119*    Imaging Dg Chest Port 1 View  Result Date: 03/11/2017 CLINICAL DATA:  Acute respiratory failure EXAM: PORTABLE CHEST 1 VIEW COMPARISON:  03/10/2017 FINDINGS: Endotracheal tube in good position. Feeding tube in the stomach with the tip not visualized. Right arm PICC tip in the SVC unchanged Diffuse bilateral airspace disease with interval improvement from yesterday. Bibasilar  atelectasis and small effusions. IMPRESSION: Support lines remain in good position Diffuse bilateral airspace disease with interval improvement. Electronically Signed   By: Franchot Gallo M.D.   On: 03/11/2017 07:19   STUDIES:  CTA 6/18: Ruptured intracranial aneurysm with hemorrhage into the left inferior frontal gyrus and then into the ventricular system via of the left lateral ventricle. 2. Positive for active extravasation of hemorrhage from a superiorly directed left supraclinoid ICA aneurysm (~5 x 3 mm).4. 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 5. Mild ventriculomegaly/transependymal edema. Absence subarachnoid hemorrhage at this time, and basilar cisterns remain patent. 6. Tortuous carotid arteries. No atherosclerosis or stenosis CT Head 6/20: Acute intra-axial hemorrhage at the anterior inferior left frontal lobe again seen, relatively stable measuring 4.1 x 2.2 cm. Similar surrounding low-density vasogenic edema. Intraventricular extension with blood in the lateral, third, and fourth ventricles, similar to previous. There has been interval placement of a right frontal approach ventricular catheter with tip seen terminating at the left thalamus. Ventricular dilatation relatively similar, with asymmetric dilatation of the left lateral ventricle as compared to the right. Basilar cisterns remain patent. No significant midline shift. No new hemorrhage. Streak artifact from coiled aneurysm noted. No acute large vessel territory infarct. No extra-axial fluid collection. CXR 6/22 > New extensive right lung airspace diease  CTA Head/Neck 6/22 >>1. Satisfactory post coil embolization appearance of the left supraclinoid ICA aneurysm. 2. 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 Left MCA and bilateral PCA Vasa spasm appears  unchanged. 3. Questionable new high-grade stenosis at the left vertebral artery origin. The left vertebral artery otherwise appears stable and within normal limits throughout its course. It functionally terminates in PICA. 4. Otherwise stable arterial findings in the neck. CT Chest 6/22 >>  Multifocal airspace opacities, most confluent throughout the right lung and in the lower lobes concerning for multifocal pneumonia.  CULTURES: Sputum 6/21 >>>MSSA Sputum 6/22 >>MSSA Blood 6/22 >> ng  ANTIBIOTICS: Vancomycin 6/22 >>6/23 zyvox 6/23>>> 6/25 Zosyn 6/22 >> 6/25 Ancef 6/25 >>  SIGNIFICANT EVENTS: 6/18 > Presented to ED with [facility]  LINES/TUBES: oett 6/18>>> PICC 6/21 >>     ASSESSMENT / PLAN:  Acute encephalopathy SAH w/ IVH s/p IVC placed in ED 6/18. Aneurysm coiled 6/18. Vasospasm  Plan:   Ct keppra IVC per neuro Ct HHH therapy - although Rx of ARDS supercedes vasospasm   Cont nimodipine    ARDS/ Acute hypoxic and hypercarbic respiratory failure in setting of HCAP, and volume overload in setting of worsening renal failure  6/24 densely consolidated on right. Improving CXR with neg balance Plan Full vent support - ct lung protective Tv with PEEP +16 Cont NMB, drop  FIO2   Staph HCAP Also group B strep PNA  - dc vanc/ zosyn Change to ancef  Circulatory shock, favor sepsis and cardiogenic from acidosis  Plan Cont current pressors neuro Sx recommending SBP of 180    AKI-->Likely d/t contrast and vanc , improving Metabolic acidosis  Severe hypokalemia  Plan:   Decrease lasix gtt  -aim for neg 100/h  Dc bicarb gtt & rpt ABG Replace & repeat K PO & IV   Dysphagia  Nutrition Needs  Plan:   Trickle TF ppi  Anemia of critical illness plan scds Transfuse per protocol  SCDs  Hyperglycemia  Plan cbg q 4 ssi  Family - DNR issued, husband  updated 6/25   DISCUSSION: SAH, no vasospasm.  Also in ARDS/ MSSA pna  Volume management is challenging -  need negative for ARDS but need to be well hydrated for [facility] therapy - will try to keep even with lasix gtt     My cc time 45 minutes  Kara Mead MD. [facility]. [place] Pulmonary & Critical care Pager [phone] If no response call [phone]   03/11/2017

## 2017-03-11 NOTE — Progress Notes (Signed)
CRITICAL VALUE ALERT  Critical Value:  K+ 2.4  Date & Time Notied:  0640  Provider Notified: Dr. Nelda Marseille  Orders Received/Actions taken: MD aware. Awaiting new orders

## 2017-03-11 NOTE — Progress Notes (Signed)
Notified E-link of excessive urinary output and liquid stool.

## 2017-03-12 ENCOUNTER — Inpatient Hospital Stay (HOSPITAL_COMMUNITY): Payer: 59

## 2017-03-12 DIAGNOSIS — I609 Nontraumatic subarachnoid hemorrhage, unspecified: Secondary | ICD-10-CM

## 2017-03-12 LAB — GLUCOSE, CAPILLARY
GLUCOSE-CAPILLARY: 146 mg/dL — AB (ref 65–99)
GLUCOSE-CAPILLARY: 150 mg/dL — AB (ref 65–99)
Glucose-Capillary: 155 mg/dL — ABNORMAL HIGH (ref 65–99)
Glucose-Capillary: 121 mg/dL — ABNORMAL HIGH (ref 65–99)
Glucose-Capillary: 117 mg/dL — ABNORMAL HIGH (ref 65–99)
Glucose-Capillary: 164 mg/dL — ABNORMAL HIGH (ref 65–99)

## 2017-03-12 LAB — CULTURE, BLOOD (ROUTINE X 2)
CULTURE: NO GROWTH
Culture: NO GROWTH
SPECIAL REQUESTS: ADEQUATE
Special Requests: ADEQUATE

## 2017-03-12 LAB — BASIC METABOLIC PANEL
ANION GAP: 10 (ref 5–15)
ANION GAP: 13 (ref 5–15)
BUN: 12 mg/dL (ref 6–20)
BUN: 14 mg/dL (ref 6–20)
CALCIUM: 8.3 mg/dL — AB (ref 8.9–10.3)
CALCIUM: 8.4 mg/dL — AB (ref 8.9–10.3)
CHLORIDE: 96 mmol/L — AB (ref 101–111)
CO2: 42 mmol/L — AB (ref 22–32)
CO2: 41 mmol/L — AB (ref 22–32)
Chloride: 97 mmol/L — ABNORMAL LOW (ref 101–111)
Creatinine, Ser: 1.01 mg/dL — ABNORMAL HIGH (ref 0.44–1.00)
Creatinine, Ser: 1.12 mg/dL — ABNORMAL HIGH (ref 0.44–1.00)
GFR calc Af Amer: >60 mL/min (ref >60)
GFR calc Af Amer: >60 mL/min (ref >60)
GFR calc non Af Amer: >60 mL/min (ref >60)
GFR calc non Af Amer: 58 mL/min — ABNORMAL LOW (ref >60)
GLUCOSE: 139 mg/dL — AB (ref 65–99)
GLUCOSE: 170 mg/dL — AB (ref 65–99)
Potassium: 2.7 mmol/L — CL (ref 3.5–5.1)
Potassium: 2.9 mmol/L — ABNORMAL LOW (ref 3.5–5.1)
Sodium: 149 mmol/L — ABNORMAL HIGH (ref 135–145)
Sodium: 150 mmol/L — ABNORMAL HIGH (ref 135–145)

## 2017-03-12 LAB — BLOOD GAS, ARTERIAL
Acid-Base Excess: 19.4 mmol/L — ABNORMAL HIGH (ref 0.0–2.0)
BICARBONATE: 44.9 mmol/L — AB (ref 20.0–28.0)
Drawn by: 331001
FIO2: 60
LHR: 32 {breaths}/min
O2 SAT: 97.3 %
PCO2 ART: 69.6 mmHg — AB (ref 32.0–48.0)
PEEP: 16 cmH2O
Patient temperature: 101.5
VT: 400 mL
pH, Arterial: 7.434 (ref 7.350–7.450)
pO2, Arterial: 96.6 mmHg (ref 83.0–108.0)

## 2017-03-12 LAB — CBC
HEMATOCRIT: 27.7 % — AB (ref 36.0–46.0)
Hemoglobin: 8.8 g/dL — ABNORMAL LOW (ref 12.0–15.0)
MCH: 26.8 pg (ref 26.0–34.0)
MCHC: 31.8 g/dL (ref 30.0–36.0)
MCV: 84.5 fL (ref 78.0–100.0)
Platelets: 132 10*3/uL — ABNORMAL LOW (ref 150–400)
RBC: 3.28 MIL/uL — ABNORMAL LOW (ref 3.87–5.11)
RDW: 15.5 % (ref 11.5–15.5)
WBC: 14.8 10*3/uL — AB (ref 4.0–10.5)

## 2017-03-12 LAB — PHOSPHORUS: PHOSPHORUS: 1.3 mg/dL — AB (ref 2.5–4.6)

## 2017-03-12 LAB — MAGNESIUM: MAGNESIUM: 1.7 mg/dL (ref 1.7–2.4)

## 2017-03-12 MED ORDER — PRO-STAT SUGAR FREE PO LIQD
30.0000 mL | Freq: Two times a day (BID) | ORAL | Status: DC
  Administered 2017-03-12 – 2017-03-24 (×22): 30 mL
  Filled 2017-03-12 (×24): qty 30

## 2017-03-12 MED ORDER — POTASSIUM CHLORIDE 10 MEQ/50ML IV SOLN
INTRAVENOUS | Status: AC
  Filled 2017-03-12: qty 50

## 2017-03-12 MED ORDER — VITAL HIGH PROTEIN PO LIQD
1000.0000 mL | ORAL | Status: DC
  Administered 2017-03-12: 1000 mL
  Administered 2017-03-13 (×2)

## 2017-03-12 MED ORDER — POTASSIUM PHOSPHATE MONOBASIC 500 MG PO TABS
1000.0000 mg | ORAL_TABLET | Freq: Four times a day (QID) | ORAL | Status: DC
  Filled 2017-03-12: qty 2

## 2017-03-12 MED ORDER — K PHOS MONO-SOD PHOS DI & MONO 155-852-130 MG PO TABS
1000.0000 mg | ORAL_TABLET | Freq: Four times a day (QID) | ORAL | Status: AC
  Administered 2017-03-12 (×3): 1000 mg via ORAL
  Filled 2017-03-12 (×3): qty 4

## 2017-03-12 MED ORDER — FUROSEMIDE 10 MG/ML IJ SOLN
40.0000 mg | Freq: Two times a day (BID) | INTRAMUSCULAR | Status: DC
  Administered 2017-03-12 (×2): 40 mg via INTRAVENOUS
  Filled 2017-03-12 (×2): qty 4

## 2017-03-12 MED ORDER — POTASSIUM CHLORIDE 10 MEQ/50ML IV SOLN
10.0000 meq | INTRAVENOUS | Status: AC
  Administered 2017-03-12 (×7): 10 meq via INTRAVENOUS
  Filled 2017-03-12 (×6): qty 50

## 2017-03-12 NOTE — Progress Notes (Signed)
No issues overnight.  EXAM:  BP (!) 169/76   Pulse 76   Temp (!) 100.4 F (38 C) (Rectal)   Resp (!) 32   Ht 5\' 7"  (1.702 m)   Wt 114.4 kg (252 lb 3.3 oz)   LMP  (LMP Unknown) Comment: pt is unresponsive, and is unable to communicate  SpO2 98%   BMI 39.50 kg/m   Intubated, sedated, paralyzed. EVD with bloody CSF, functioning well  IMPRESSION:  46 y.o. female [place] d# 10. Remains sedated and paralyzed for ARDS treatment  PLAN: - Cont current mgmt, primarily by PCCM for respiratory condition

## 2017-03-12 NOTE — Progress Notes (Signed)
CRITICAL VALUE ALERT  Critical Value: K= 2.7  Date & Time Notied:  03/12/17  0545  Provider Notified: [place]  Orders Received/Actions taken: 6 Runs of KCl

## 2017-03-12 NOTE — Progress Notes (Signed)
Nutrition Follow-up  DOCUMENTATION CODES:   Obesity unspecified  INTERVENTION:   Increase Vital High Protein to 30 ml/hr Add 30 ml Prostat BID Provides: 920 kcal, 93 grams protein, and 601 ml free water.   As electrolytes improve goal rate will be 50 ml/hr Provides: 1400 kcal, 135 grams protein, 1003 ml free water  NUTRITION DIAGNOSIS:   Inadequate oral intake related to inability to eat as evidenced by NPO status. Ongoing.   GOAL:   Patient will meet greater than or equal to 90% of their needs Progressing.   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  Patient is currently intubated on ventilator support +fevers, pressor need decreased, paralytics being weaned off Spoke with MD, ok to advance TF IVC in place  Medications reviewed and include: MVI, KPhos Labs reviewed: Na 149 (H), K+ 2.7 (L), PO4 1.3 (L) TF: Vital High Protein @ 20 ml/hr (480 kcal and 42 grams protein)  Electrolytes remain low.   Diet Order:  Diet NPO time specified  Skin:  Reviewed, no issues  Last BM:  6/27, liquid stools, rectal tube inserted unknown volume  Height:   Ht Readings from Last 1 Encounters:  03/03/17 5\' 7"  (1.702 m)    Weight:   Wt Readings from Last 1 Encounters:  03/12/17 252 lb 3.3 oz (114.4 kg)    Ideal Body Weight:  61.3 kg  BMI:  Body mass index is 39.5 kg/m.  Estimated Nutritional Needs:   Kcal:  9381-0175  Protein:  >/= 122 grams  Fluid:  > 2 L/day  EDUCATION NEEDS:   No education needs identified at this time  [place], [place], [place] Pager [phone] After Hours Pager

## 2017-03-12 NOTE — Progress Notes (Signed)
Transcranial Doppler  Date POD PCO2 HCT BP  MCA ACA PCA OPHT SIPH VERT Basilar  03/04/17 vs     Right  Left   57  71   -45  -45   53  67   16  14   *  51   *  *   *  *    03/07/17 vs/ je     Right  Left   *  *   *  *   *  *   12  8   *  *   *  *   *      6/25 je  45.7 25.8 135/63 Right  Left   63  85   -64  -68   *  *   21  25   -66  -92   -38  -50   *       6/27/18rs     Right  Left   53  *   -45  *   *  *   16  30   63  70   -61  *   -41            Right  Left                                            Right  Left                                            Right  Left                                        MCA = Middle Cerebral Artery      OPHT = Opthalmic Artery     BASILAR = Basilar Artery   ACA = Anterior Cerebral Artery     SIPH = Carotid Siphon PCA = Posterior Cerebral Artery   VERT = Verterbral Artery                 Left Lindegaard ratio 3.07 Right lindegaard ratio 1.63 Normal MCA = 62+\-12 ACA = 50+\-12 PCA = 42+\-23   03/04/17 Unable to obtain the vertebral due to ET tube stabilizer 6/22 very limited study due to patient constant shaking/ tensing up. je/vs 6/25 very poor windows- had to increase gain for all images to visualize waveform. Patient's BP has be up and down. Unsure of accuracy due to abnormalities of BP. Lindegaard ratio: right 1.5 left 1.2. JE 6/27 - Limited study on the left side due to poor window from position of patient.  (*) not insonated.  RDS    03/04/2017 6:20 PM

## 2017-03-12 NOTE — Progress Notes (Signed)
PULMONARY / CRITICAL CARE MEDICINE   Name: Carly [name] MRN: [id number] DOB: 10-02-1970    ADMISSION DATE:  03/03/2017 CONSULTATION DATE:  6/18  REFERRING MD:  Dr. Ralene Bathe EDP  CHIEF COMPLAINT: Headache  Brief:   46 year old female with PMH as below, which is significant for HTN, GERD, and migraines admitted 6/18 for HA & focal seizure activity. CT head SAH w/ associated IVH in setting of presumed ruptured aneurysm. Developed progressive decreased LOC, vomited, postured. Intubated for airway protection and IVC placed per neuro-surg.   SUBJECTIVE:  Remains Critically ill, sedated, paralyzed Febrile 101 Remains on 3 pressors  - lower doses 10 L UO on lasix gtt  VITAL SIGNS: BP (!) 169/76   Pulse 76   Temp (!) 101 F (38.3 C) (Rectal)   Resp (!) 32   Ht 5\' 7"  (1.702 m)   Wt 252 lb 3.3 oz (114.4 kg)   LMP  (LMP Unknown) Comment: pt is unresponsive, and is unable to communicate  SpO2 98%   BMI 39.50 kg/m   HEMODYNAMICS:    VENTILATOR SETTINGS: Vent Mode: PRVC FiO2 (%):  [60 %-75 %] 60 % Set Rate:  [32 bmp] 32 bmp Vt Set:  [400 mL] 400 mL PEEP:  [16 cmH20] 16 cmH20 Plateau Pressure:  [32 cmH20-35 cmH20] 32 cmH20  INTAKE / OUTPUT: I/O last 3 completed shifts: In: 7474.7 [I.V.:6234.7; Other:50; NG/GT:640; IV Piggyback:550] Out: 92426 [Urine:16016; Drains:196]  Physical Exam  Constitutional: She appears well-developed and well-nourished. No distress. She is sedated, chemically paralyzed and intubated.  HENT:  Head: Normocephalic and atraumatic.  Cardiovascular: Normal rate, regular rhythm and normal heart sounds.   Pulmonary/Chest: She is intubated. No respiratory distress. She has decreased breath sounds.  Abdominal: Soft. Normal appearance and bowel sounds are normal. There is no tenderness.  Genitourinary:  Genitourinary Comments: Clear yellow but minimal urine output   Musculoskeletal: She exhibits edema.  Neurological: GCS eye subscore is 1. GCS verbal subscore  is 1. GCS motor subscore is 1.  Deep sedation & paralysed  Skin: Skin is warm. No rash noted. She is not diaphoretic.   LABS:  BMET  Recent Labs Lab 03/11/17 0530 03/11/17 1750 03/12/17 0449  NA 148* 148* 149*  K 2.4* 2.9* 2.7*  CL 106 100* 97*  CO2 29 37* 42*  BUN 20 13 12   CREATININE 1.69* 1.19* 1.01*  GLUCOSE 177* 161* 139*    Electrolytes  Recent Labs Lab 03/07/17 1630  03/09/17 0453  03/11/17 0530 03/11/17 1750 03/12/17 0449  CALCIUM  --   < > 7.0*  < > 8.0* 8.1* 8.3*  MG 1.8  --  2.0  --  1.5*  --  1.7  PHOS 2.1*  --   --   --  2.1*  --  1.3*  < > = values in this interval not displayed.  CBC  Recent Labs Lab 03/10/17 0633 03/11/17 0530 03/12/17 0449  WBC 19.2* 20.4* 14.8*  HGB 8.3* 8.9* 8.8*  HCT 25.8* 26.9* 27.7*  PLT 159 136* 132*    Coag's No results for input(s): APTT, INR in the last 168 hours.  Sepsis Markers  Recent Labs Lab 03/06/17 1054 03/07/17 0805 03/08/17 0420  PROCALCITON <0.10 1.28 13.36    ABG  Recent Labs Lab 03/10/17 0339 03/11/17 0555 03/11/17 1509  PHART 7.198* 7.337* 7.430  PCO2ART 45.7 57.0* 51.4*  PO2ART 48.0* 106 52.0*    Liver Enzymes  Recent Labs Lab 03/08/17 1601 03/09/17 0453  AST 58*  102*  ALT 10* 57*  ALKPHOS 85 86  BILITOT 1.2 1.1  ALBUMIN 2.4* 2.1*    Cardiac Enzymes  Recent Labs Lab 03/07/17 1043  TROPONINI 0.36*    Glucose  Recent Labs Lab 03/11/17 1136 03/11/17 1608 03/11/17 1957 03/11/17 2352 03/12/17 0358 03/12/17 0813  GLUCAP 112* 135* 157* 141* 155* 121*    Imaging Dg Chest Port 1 View  Result Date: 03/12/2017 CLINICAL DATA:  Respiratory failure. EXAM: PORTABLE CHEST 1 VIEW COMPARISON:  03/11/2017 FINDINGS: Endotracheal tube, feeding tube, right PICC line in stable position. Heart size stable. Diffuse unchanged bilateral airspace disease. No pleural effusion or pneumothorax. IMPRESSION: 1. Lines and tubes stable position. 2. Diffuse bilateral airspace disease  without interim change . Electronically Signed   By: Marcello Moores  Register   On: 03/12/2017 07:18   STUDIES:  CTA 6/18: Ruptured intracranial aneurysm with hemorrhage into the left inferior frontal gyrus and then into the ventricular system via of the left lateral ventricle. 2. Positive for active extravasation of hemorrhage from a superiorly directed left supraclinoid ICA aneurysm (~5 x 3 mm).4. 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 5. Mild ventriculomegaly/transependymal edema. Absence subarachnoid hemorrhage at this time, and basilar cisterns remain patent. 6. Tortuous carotid arteries. No atherosclerosis or stenosis CT Head 6/20: Acute intra-axial hemorrhage at the anterior inferior left frontal lobe again seen, relatively stable measuring 4.1 x 2.2 cm. Similar surrounding low-density vasogenic edema. Intraventricular extension with blood in the lateral, third, and fourth ventricles, similar to previous. There has been interval placement of a right frontal approach ventricular catheter with tip seen terminating at the left thalamus. Ventricular dilatation relatively similar, with asymmetric dilatation of the left lateral ventricle as compared to the right. Basilar cisterns remain patent. No significant midline shift. No new hemorrhage. Streak artifact from coiled aneurysm noted. No acute large vessel territory infarct. No extra-axial fluid collection. CXR 6/22 > New extensive right lung airspace diease  CTA Head/Neck 6/22 >>1. Satisfactory post coil embolization appearance of the left supraclinoid ICA aneurysm. 2. 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 Left MCA and bilateral PCA Vasa spasm appears unchanged. 3. Questionable new high-grade stenosis at the left vertebral artery origin. The left vertebral artery otherwise  appears stable and within normal limits throughout its course. It functionally terminates in PICA. 4. Otherwise stable arterial findings in the neck. CT Chest 6/22 >>  Multifocal airspace opacities, most confluent throughout the right lung and in the lower lobes concerning for multifocal pneumonia.  CULTURES: Sputum 6/21 >>>MSSA Sputum 6/22 >>MSSA Blood 6/22 >> ng  ANTIBIOTICS: Vancomycin 6/22 >>6/23 zyvox 6/23>>> 6/25 Zosyn 6/22 >> 6/25 Ancef 6/25 >>  SIGNIFICANT EVENTS: 6/18 > Presented to ED with [facility]  LINES/TUBES: oett 6/18>>> PICC 6/21 >>     ASSESSMENT / PLAN:  Acute encephalopathy SAH w/ IVH s/p IVC placed in ED 6/18. Aneurysm coiled 6/18. Vasospasm  Plan:   Ct keppra IVC per neuro Ct modified HHH therapy - since Rx of ARDS supercedes vasospasm , maintain SBP 140 & above Cont nimodipine    ARDS/ Acute hypoxic and hypercarbic respiratory failure in setting of HCAP, and volume overload in setting of worsening renal failure 6/24 densely consolidated on right. Improving CXR with neg balance Plan Full vent support - ct lung protective Tv , drop  PEEP +14 drop FIO2 Attempt off NMB    Staph HCAP Also  group B strep PNA  - Ct ancef x 7-10 ds  Circulatory shock, favor sepsis and cardiogenic from acidosis  Plan Taper pressors - would dc neo & vaso first  SBP of 140 & above acceptable    AKI-->Likely d/t contrast and vanc , improving Metabolic acidosis  Severe hypokalemia / hypophos/ hypomag  Plan:   DC lasix gtt  - use 40 q 12h & aim for neg 2L daily  Replace & repeat K -phos Check lytes daily   Dysphagia  Nutrition Needs  Plan:   Ct Trickle TF ppi  Anemia of critical illness plan scds Transfuse per protocol  SCDs  Hyperglycemia  Plan cbg q 4 ssi  Family - DNR issued, husband  updated daily   DISCUSSION: SAH, no vasospasm on TCDs.  Also ARDS/ MSSA pna , AKI --resolved Volume management is challenging - need negative for ARDS but  need to be well hydrated for [facility] therapy - aiming for net negative to improve hypoxia    My cc time 40 minutes  Kara Mead MD. FCCP. [facility] Pager [phone] If no response call [phone]   03/12/2017

## 2017-03-13 ENCOUNTER — Inpatient Hospital Stay (HOSPITAL_COMMUNITY): Payer: 59

## 2017-03-13 LAB — CBC
HEMATOCRIT: 26.5 % — AB (ref 36.0–46.0)
HEMOGLOBIN: 8.4 g/dL — AB (ref 12.0–15.0)
MCH: 27.5 pg (ref 26.0–34.0)
MCHC: 31.7 g/dL (ref 30.0–36.0)
MCV: 86.6 fL (ref 78.0–100.0)
Platelets: 124 10*3/uL — ABNORMAL LOW (ref 150–400)
RBC: 3.06 MIL/uL — ABNORMAL LOW (ref 3.87–5.11)
RDW: 15.4 % (ref 11.5–15.5)
WBC: 19 10*3/uL — AB (ref 4.0–10.5)

## 2017-03-13 LAB — BASIC METABOLIC PANEL
ANION GAP: 13 (ref 5–15)
ANION GAP: 11 (ref 5–15)
BUN: 15 mg/dL (ref 6–20)
BUN: 17 mg/dL (ref 6–20)
CALCIUM: 8.2 mg/dL — AB (ref 8.9–10.3)
CHLORIDE: 93 mmol/L — AB (ref 101–111)
CHLORIDE: 97 mmol/L — AB (ref 101–111)
CO2: 44 mmol/L — AB (ref 22–32)
CO2: 40 mmol/L — ABNORMAL HIGH (ref 22–32)
CREATININE: 1.03 mg/dL — AB (ref 0.44–1.00)
Calcium: 8.1 mg/dL — ABNORMAL LOW (ref 8.9–10.3)
Creatinine, Ser: 1.12 mg/dL — ABNORMAL HIGH (ref 0.44–1.00)
GFR calc Af Amer: >60 mL/min (ref >60)
GFR calc non Af Amer: 58 mL/min — ABNORMAL LOW (ref >60)
GFR calc non Af Amer: >60 mL/min (ref >60)
Glucose, Bld: 138 mg/dL — ABNORMAL HIGH (ref 65–99)
Glucose, Bld: 158 mg/dL — ABNORMAL HIGH (ref 65–99)
POTASSIUM: 3 mmol/L — AB (ref 3.5–5.1)
Potassium: 3 mmol/L — ABNORMAL LOW (ref 3.5–5.1)
Sodium: 150 mmol/L — ABNORMAL HIGH (ref 135–145)
Sodium: 148 mmol/L — ABNORMAL HIGH (ref 135–145)

## 2017-03-13 LAB — GLUCOSE, CAPILLARY
GLUCOSE-CAPILLARY: 154 mg/dL — AB (ref 65–99)
GLUCOSE-CAPILLARY: 174 mg/dL — AB (ref 65–99)
GLUCOSE-CAPILLARY: 165 mg/dL — AB (ref 65–99)
Glucose-Capillary: 119 mg/dL — ABNORMAL HIGH (ref 65–99)
Glucose-Capillary: 142 mg/dL — ABNORMAL HIGH (ref 65–99)
Glucose-Capillary: 144 mg/dL — ABNORMAL HIGH (ref 65–99)
Glucose-Capillary: 146 mg/dL — ABNORMAL HIGH (ref 65–99)

## 2017-03-13 LAB — BLOOD GAS, ARTERIAL
ACID-BASE EXCESS: 20.2 mmol/L — AB (ref 0.0–2.0)
Bicarbonate: 46.1 mmol/L — ABNORMAL HIGH (ref 20.0–28.0)
Drawn by: 39898
FIO2: 100
O2 SAT: 99.4 %
PEEP/CPAP: 16 cmH2O
PH ART: 7.395 (ref 7.350–7.450)
Patient temperature: 102
RATE: 28 resp/min
VT: 400 mL
pCO2 arterial: 78.8 mmHg (ref 32.0–48.0)
pO2, Arterial: 213 mmHg — ABNORMAL HIGH (ref 83.0–108.0)

## 2017-03-13 LAB — PHOSPHORUS: PHOSPHORUS: 4.4 mg/dL (ref 2.5–4.6)

## 2017-03-13 LAB — MAGNESIUM: Magnesium: 1.9 mg/dL (ref 1.7–2.4)

## 2017-03-13 MED ORDER — NIMODIPINE 30 MG PO CAPS: 30.0000 mg | ORAL_CAPSULE | ORAL | Status: DC

## 2017-03-13 MED ORDER — FUROSEMIDE 10 MG/ML IJ SOLN
8.0000 mg/h | INTRAVENOUS | Status: DC
  Administered 2017-03-13: 3 mg/h via INTRAVENOUS
  Administered 2017-03-15: 8 mg/h via INTRAVENOUS
  Filled 2017-03-13 (×4): qty 25

## 2017-03-13 MED ORDER — VITAL HIGH PROTEIN PO LIQD
1000.0000 mL | ORAL | Status: DC
  Administered 2017-03-13 – 2017-03-18 (×6): 1000 mL

## 2017-03-13 MED ORDER — POTASSIUM CHLORIDE 10 MEQ/50ML IV SOLN
10.0000 meq | INTRAVENOUS | Status: AC
  Administered 2017-03-13 (×2): 10 meq via INTRAVENOUS
  Filled 2017-03-13 (×2): qty 50

## 2017-03-13 MED ORDER — NIMODIPINE 60 MG/20ML PO SOLN
30.0000 mg | ORAL | Status: DC
  Administered 2017-03-13 (×2): 30 mg
  Filled 2017-03-13 (×2): qty 20

## 2017-03-13 MED ORDER — POTASSIUM CHLORIDE 10 MEQ/50ML IV SOLN
10.0000 meq | INTRAVENOUS | Status: AC
  Administered 2017-03-13 (×3): 10 meq via INTRAVENOUS
  Filled 2017-03-13 (×3): qty 50

## 2017-03-13 MED ORDER — POTASSIUM CHLORIDE 20 MEQ/15ML (10%) PO SOLN
40.0000 meq | Freq: Once | ORAL | Status: AC
  Administered 2017-03-13: 40 meq
  Filled 2017-03-13: qty 30

## 2017-03-13 MED ORDER — NIMODIPINE 60 MG/20ML PO SOLN: 60.0000 mg | ORAL | Status: DC

## 2017-03-13 MED ORDER — FREE WATER
200.0000 mL | Status: DC
  Administered 2017-03-13 – 2017-03-14 (×7): 200 mL

## 2017-03-13 NOTE — Progress Notes (Addendum)
RN called d/t pt desat 84-87%.  Fio2 initially increased to 80%, sat still 84-86%.  Recrutiment maneuver performed w/ little benefit noted.  Fio2 increased to 100%, sat 89-90% Dr Elsworth Soho being paged now.   36- Dr Elsworth Soho aware, no new RT orders received currently.

## 2017-03-13 NOTE — Progress Notes (Signed)
CRITICAL VALUE ALERT  Critical Value:  PH: 7.325   CO2: 78.7     Bicarb: 46.1   O2: 213  Date & Time Notied:  03/13/17 0415  Provider Notified: [place] [phone]  Orders Received/Actions taken: Awaiting orders

## 2017-03-13 NOTE — Progress Notes (Addendum)
BP dropped significantly (from 150s to 90s)  after admin of Nimodipine, requiring up to 150 mcg of Neo which was previously off.  Dr. Diamantina Monks notified, Nimodipine changed to 30 mg Q 2.

## 2017-03-13 NOTE — Progress Notes (Signed)
Nutrition Follow-up  DOCUMENTATION CODES:   Obesity unspecified  INTERVENTION:   Increase Vital High Protein by 10 ml every 12 hours to goal rate of 50 ml/hr Continue 30 ml Prostat BID Provides: 1400 kcal, 135 grams protein, and 1003 ml free water.   NUTRITION DIAGNOSIS:   Inadequate oral intake related to inability to eat as evidenced by NPO status. Ongoing.   GOAL:   Provide needs based on ASPEN/SCCM guidelines Progressing.   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  Remains on vent.  Off paralytic, febrile, remains on pressors, replacing K and phos  200 ml free water every 4 hours Medications reviewed and include: MVI, KCl, neo-synephrine, levophed Labs reviewed: Na 150 (H), K+ 30. (L), PO4: 4.4 - WNL, Magnesium 1.9 - WNL CBG's: 146-154  TF: Vital High Protein @ 30 ml/hr with 30 ml Prostat BID (920 kcal and 93 grams protein)   Diet Order:  Diet NPO time specified  Skin:  Reviewed, no issues  Last BM:  6/27: 750 ml via rectal tube x 24 hours   Height:   Ht Readings from Last 1 Encounters:  03/03/17 5\' 7"  (1.702 m)    Weight:   Wt Readings from Last 1 Encounters:  03/13/17 255 lb 11.7 oz (116 kg)    Ideal Body Weight:  61.3 kg  BMI:  Body mass index is 40.05 kg/m.  Estimated Nutritional Needs:   Kcal:  6599-3570  Protein:  >/= 122 grams  Fluid:  > 2 L/day  EDUCATION NEEDS:   No education needs identified at this time  [place], [place], [place] Pager [phone] After Hours Pager

## 2017-03-13 NOTE — Progress Notes (Signed)
eLink Physician-Brief Progress Note Patient Name: Carly [name] DOB: 1971-08-09 MRN: [id number]   Date of Service  03/13/2017  HPI/Events of Note  Patient on Lasix drip. K 3.0. PICC & NGT in place. Creatinine 1.03.  eICU Interventions  KCl 38mEq via tube x1 KCl 46mEq IV x2 runs     Intervention Category Major Interventions: Electrolyte abnormality - evaluation and management  Tera Partridge 03/13/2017, 7:06 PM

## 2017-03-13 NOTE — Progress Notes (Signed)
PULMONARY / CRITICAL CARE MEDICINE   Name: Carly [name] MRN: [id number] DOB: 02-17-1971    ADMISSION DATE:  03/03/2017 CONSULTATION DATE:  6/18  REFERRING MD:  Dr. Ralene Bathe EDP  CHIEF COMPLAINT: Headache  Brief:   46 year old female with PMH as below, which is significant for HTN, GERD, and migraines admitted 6/18 for HA & focal seizure activity. CT head SAH w/ associated IVH in setting of presumed ruptured aneurysm. Developed progressive decreased LOC, vomited, postured. Intubated for airway protection and IVC placed per neuro-surg.    STUDIES:  CTA 6/18: Ruptured intracranial aneurysm with hemorrhage into the left inferior frontal gyrus and then into the ventricular system via of the left lateral ventricle. 2. Positive for active extravasation of hemorrhage from a superiorly directed left supraclinoid ICA aneurysm (~5 x 3 mm).4. 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 5. Mild ventriculomegaly/transependymal edema. Absence subarachnoid hemorrhage at this time, and basilar cisterns remain patent. 6. Tortuous carotid arteries. No atherosclerosis or stenosis   CXR 6/22 > New extensive right lung airspace diease   CTA Head/Neck 6/22 >>1. Satisfactory post coil embolization appearance of the left supraclinoid ICA aneurysm. 2. Continued intracranial artery vasospasm suspected, but improved caliber and enhancement of the bilateral ACAs, and perhaps also the right MCA branches. Left MCA and bilateral PCA Vasa spasm appears unchanged. 3. Questionable new high-grade stenosis at the left vertebral artery origin. The left vertebral artery otherwise appears stable and within normal limits throughout its course. It functionally terminates in PICA.   CT Chest 6/22 >>  Multifocal airspace opacities, most confluent throughout the right lung and in the lower lobes concerning for multifocal pneumonia.  CULTURES: Sputum 6/21 >>>MSSA Sputum 6/22 >>MSSA Blood 6/22 >>  ng  ANTIBIOTICS: Vancomycin 6/22 >>6/23 zyvox 6/23>>> 6/25 Zosyn 6/22 >> 6/25 Ancef 6/25 >>  SIGNIFICANT EVENTS: 6/18 > Presented to ED with [facility] 6/27 off NMB  LINES/TUBES: oett 6/18>>> PICC 6/21 >>    SUBJECTIVE:  Remains Critically ill, sedated Off paralytic Febrile 102 Remains on 3 pressors  - higher than yesterday  UO lower on lasix  VITAL SIGNS: BP 129/62   Pulse 73   Temp 99.5 F (37.5 C) (Axillary)   Resp (!) 28   Ht 5\' 7"  (1.702 m)   Wt 255 lb 11.7 oz (116 kg)   LMP  (LMP Unknown) Comment: pt is unresponsive, and is unable to communicate  SpO2 94%   BMI 40.05 kg/m   HEMODYNAMICS: CVP:  [0 mmHg] 0 mmHg  VENTILATOR SETTINGS: Vent Mode: PRVC FiO2 (%):  [50 %-100 %] 60 % Set Rate:  [28 bmp-32 bmp] 28 bmp Vt Set:  [400 mL] 400 mL PEEP:  [14 cmH20-16 cmH20] 16 cmH20 Plateau Pressure:  [30 cmH20-32 cmH20] 31 cmH20  INTAKE / OUTPUT: I/O last 3 completed shifts: In: 4026.4 [I.V.:2821.9; NG/GT:804.5; IV Piggyback:400] Out: 7829 [FAOZH:0865; Drains:257; Stool:750]  Physical Exam  Constitutional: She appears well-developed and well-nourished. No distress. She is sedated and intubated.  HENT:  Head: Normocephalic and atraumatic.  Cardiovascular: Normal rate, regular rhythm and normal heart sounds.   Pulmonary/Chest: She is intubated. No respiratory distress. She has decreased breath sounds.  Abdominal: Soft. Normal appearance and bowel sounds are normal. There is no tenderness.  Genitourinary:  Genitourinary Comments: Clear yellow but minimal urine output   Musculoskeletal: She exhibits edema.  Neurological:  RASS-5  Skin: Skin is warm. No rash noted. She is not diaphoretic.   LABS:  BMET  Recent Labs  Lab 03/12/17 0449 03/12/17 1719 03/13/17 0354  NA 149* 150* 150*  K 2.7* 2.9* 3.0*  CL 97* 96* 93*  CO2 42* 41* 44*  BUN 12 14 15   CREATININE 1.01* 1.12* 1.12*  GLUCOSE 139* 170* 138*    Electrolytes  Recent Labs Lab 03/11/17 0530   03/12/17 0449 03/12/17 1719 03/13/17 0354  CALCIUM 8.0*  < > 8.3* 8.4* 8.1*  MG 1.5*  --  1.7  --  1.9  PHOS 2.1*  --  1.3*  --  4.4  < > = values in this interval not displayed.  CBC  Recent Labs Lab 03/11/17 0530 03/12/17 0449 03/13/17 0354  WBC 20.4* 14.8* 19.0*  HGB 8.9* 8.8* 8.4*  HCT 26.9* 27.7* 26.5*  PLT 136* 132* 124*    Coag's No results for input(s): APTT, INR in the last 168 hours.  Sepsis Markers  Recent Labs Lab 03/06/17 1054 03/07/17 0805 03/08/17 0420  PROCALCITON <0.10 1.28 13.36    ABG  Recent Labs Lab 03/11/17 1509 03/12/17 1100 03/13/17 0410  PHART 7.430 7.434 7.395  PCO2ART 51.4* 69.6* 78.8*  PO2ART 52.0* 96.6 213*    Liver Enzymes  Recent Labs Lab 03/08/17 1601 03/09/17 0453  AST 58* 102*  ALT 10* 57*  ALKPHOS 85 86  BILITOT 1.2 1.1  ALBUMIN 2.4* 2.1*    Cardiac Enzymes  Recent Labs Lab 03/07/17 1043  TROPONINI 0.36*    Glucose  Recent Labs Lab 03/12/17 1127 03/12/17 1513 03/12/17 1930 03/12/17 2338 03/13/17 0346 03/13/17 0756  GLUCAP 117* 150* 164* 146* 146* 154*    Imaging Dg Chest Port 1 View  Result Date: 03/13/2017 CLINICAL DATA:  Respiratory failure. EXAM: PORTABLE CHEST 1 VIEW COMPARISON:  03/12/2017. FINDINGS: Endotracheal tube, feeding tube, right PICC line in stable position. Heart size normal. Diffuse bilateral pulmonary infiltrates/ edema. No change from prior exam . No pleural effusion or pneumothorax . IMPRESSION: 1. Lines and tubes in stable position. 2. Diffuse bilateral pulmonary infiltrates/ edema without interim change. Electronically Signed   By: Marcello Moores  Register   On: 03/13/2017 07:19     ASSESSMENT / PLAN:  Acute encephalopathy SAH w/ IVH s/p IVC placed in ED 6/18. Aneurysm coiled 6/18. Vasospasm  Plan:   Ct keppra IVC per neuro Ct modified HHH therapy   Maintain SBP 140 & above Cont nimodipine    ARDS/ Acute hypoxic and hypercarbic respiratory failure in setting of HCAP,  and volume overload in setting of worsening renal failure 6/24 densely consolidated on right. Improving CXR with neg balance Plan Full vent support - ct lung protective Tv , drop FIO2, once down to 50%, then can start dropping PEEP    Staph HCAP Also group B strep PNA  - Ct ancef x 7-10 ds  Circulatory shock, favor sepsis and cardiogenic from acidosis  Plan Taper pressors - would dc neo & vaso once levo down to 20 mcg & lower  SBP of 140 & above acceptable    AKI-->Likely d/t contrast and vanc , improved Metabolic acidosis  Severe hypokalemia / hypophos/ hypomag  Plan:   Not good response with lasix 40 q 12h - put back on lasix gtt Replace & repeat K -phos Check lytes daily   Dysphagia  Nutrition Needs  Plan:   Ct  TFs to goal, now that off NMB ppi  Anemia of critical illness plan scds Transfuse per protocol  SCDs  Hyperglycemia  Plan cbg q 4 ssi  Family - DNR issued, husband  updated  daily   DISCUSSION: SAH, with vasospasm on CT but not on TCDs.  Also ARDS/ MSSA pna , AKI --resolved Volume management is challenging - need negative for ARDS but need to be well hydrated for [facility] therapy - ct to aim for net negative to improve hypoxia    My cc time 35 minutes  Kara Mead MD. FCCP. [place] [facility] Pager [phone] If no response call [phone]   03/13/2017

## 2017-03-13 NOTE — Progress Notes (Signed)
No issues overnight.   EXAM:  BP (!) 128/58   Pulse 68   Temp 99.5 F (37.5 C) (Axillary)   Resp (!) 28   Ht 5\' 7"  (1.702 m)   Wt 116 kg (255 lb 11.7 oz)   LMP  (LMP Unknown) Comment: pt is unresponsive, and is unable to communicate  SpO2 90%   BMI 40.05 kg/m   On versed, off nimbex: No eye opening Pupils sluggish No motor responses EVD patent, draining bloody CSF   TCD reviewed, not concerning for spasm  IMPRESSION:  46 y.o. female [place] d#11, appears to be slowly improving from pneumonia/ARDS, however have been unable to get neurologic exam due to need for sedation/paralytic  PLAN: - Cont current mgmt - Nimotop divided to 30mg  Q2

## 2017-03-13 NOTE — Progress Notes (Signed)
Pharmacy Antibiotic Note  Carly [name] is a 46 y.o. female admitted on 03/03/2017 with sepsis on day #7 of abx for MSSA/Group B strep pneumonia. AKI improving. Also with ARDS.  Zosyn 6/22 >> 6/25 Vancomycin 6/22 >> 6/23  Linezolid 6/23 > 6/25 Ancef 6/25 >>  6/18 MRSA PCR neg 6/21 TA > abundant staph aureus 6/22 Resp Cx > MSSA 6/22 Blood x 2 > 6/22 BAL > MSSA, GBS  Plan: -Cefazolin 2g/8h -F/u duration of therapy   Height: _0  (170.2 cm) Weight: 255 lb 11.7 oz (116 kg) IBW/kg (Calculated) : 61.6  Temp (24hrs), Avg:100.7 F (38.2 C), Min:99.5 F (37.5 C), Max:102 F (38.9 C)   Recent Labs Lab 03/09/17 0453  03/10/17 0633  03/11/17 0530 03/11/17 1750 03/12/17 0449 03/12/17 1719 03/13/17 0354  WBC 20.6*  --  19.2*  --  20.4*  --  14.8*  --  19.0*  CREATININE 2.72*  < > 2.61*  < > 1.69* 1.19* 1.01* 1.12* 1.12*  < > = values in this interval not displayed.  Estimated Creatinine Clearance: 81.8 mL/min (A) (by C-G formula based on SCr of 1.12 mg/dL (H)).     Harvel Quale  03/13/2017  9:48 AM

## 2017-03-14 ENCOUNTER — Inpatient Hospital Stay (HOSPITAL_COMMUNITY): Payer: 59

## 2017-03-14 DIAGNOSIS — I609 Nontraumatic subarachnoid hemorrhage, unspecified: Secondary | ICD-10-CM

## 2017-03-14 LAB — BASIC METABOLIC PANEL
ANION GAP: 10 (ref 5–15)
ANION GAP: 10 (ref 5–15)
BUN: 17 mg/dL (ref 6–20)
BUN: 17 mg/dL (ref 6–20)
CALCIUM: 8.3 mg/dL — AB (ref 8.9–10.3)
CHLORIDE: 93 mmol/L — AB (ref 101–111)
CHLORIDE: 90 mmol/L — AB (ref 101–111)
CO2: 41 mmol/L — AB (ref 22–32)
CO2: 41 mmol/L — AB (ref 22–32)
CREATININE: 0.82 mg/dL (ref 0.44–1.00)
Calcium: 8.3 mg/dL — ABNORMAL LOW (ref 8.9–10.3)
Creatinine, Ser: 0.95 mg/dL (ref 0.44–1.00)
GFR calc Af Amer: >60 mL/min (ref >60)
GFR calc non Af Amer: >60 mL/min (ref >60)
GFR calc non Af Amer: >60 mL/min (ref >60)
GLUCOSE: 158 mg/dL — AB (ref 65–99)
Glucose, Bld: 145 mg/dL — ABNORMAL HIGH (ref 65–99)
POTASSIUM: 3.1 mmol/L — AB (ref 3.5–5.1)
Potassium: 3.4 mmol/L — ABNORMAL LOW (ref 3.5–5.1)
SODIUM: 141 mmol/L (ref 135–145)
Sodium: 144 mmol/L (ref 135–145)

## 2017-03-14 LAB — PHOSPHORUS: Phosphorus: 2.9 mg/dL (ref 2.5–4.6)

## 2017-03-14 LAB — BLOOD GAS, ARTERIAL
ACID-BASE EXCESS: 17.5 mmol/L — AB (ref 0.0–2.0)
BICARBONATE: 43.6 mmol/L — AB (ref 20.0–28.0)
DRAWN BY: 44135
FIO2: 80
LHR: 28 {breaths}/min
MECHVT: 400 mL
O2 SAT: 99.1 %
PEEP/CPAP: 16 cmH2O
PH ART: 7.383 (ref 7.350–7.450)
PO2 ART: 164 mmHg — AB (ref 83.0–108.0)
Patient temperature: 98.6
pCO2 arterial: 74.9 mmHg (ref 32.0–48.0)

## 2017-03-14 LAB — GLUCOSE, CAPILLARY
GLUCOSE-CAPILLARY: 134 mg/dL — AB (ref 65–99)
GLUCOSE-CAPILLARY: 140 mg/dL — AB (ref 65–99)
Glucose-Capillary: 152 mg/dL — ABNORMAL HIGH (ref 65–99)
Glucose-Capillary: 153 mg/dL — ABNORMAL HIGH (ref 65–99)
Glucose-Capillary: 153 mg/dL — ABNORMAL HIGH (ref 65–99)
Glucose-Capillary: 157 mg/dL — ABNORMAL HIGH (ref 65–99)

## 2017-03-14 LAB — CBC
HEMATOCRIT: 26.7 % — AB (ref 36.0–46.0)
HEMOGLOBIN: 8.1 g/dL — AB (ref 12.0–15.0)
MCH: 27.1 pg (ref 26.0–34.0)
MCHC: 30.3 g/dL (ref 30.0–36.0)
MCV: 89.3 fL (ref 78.0–100.0)
Platelets: 128 10*3/uL — ABNORMAL LOW (ref 150–400)
RBC: 2.99 MIL/uL — AB (ref 3.87–5.11)
RDW: 15.6 % — ABNORMAL HIGH (ref 11.5–15.5)
WBC: 21 10*3/uL — AB (ref 4.0–10.5)

## 2017-03-14 LAB — MAGNESIUM: MAGNESIUM: 1.9 mg/dL (ref 1.7–2.4)

## 2017-03-14 MED ORDER — FENTANYL CITRATE (PF) 100 MCG/2ML IJ SOLN: 100.0000 ug | Freq: Once | INTRAMUSCULAR | Status: DC

## 2017-03-14 MED ORDER — MIDAZOLAM HCL 2 MG/2ML IJ SOLN: 2.0000 mg | Freq: Once | INTRAMUSCULAR | Status: DC

## 2017-03-14 MED ORDER — ETOMIDATE 2 MG/ML IV SOLN: 20.0000 mg | Freq: Once | INTRAVENOUS | Status: DC

## 2017-03-14 MED ORDER — FREE WATER
200.0000 mL | Freq: Three times a day (TID) | Status: DC
  Administered 2017-03-14 – 2017-03-24 (×24): 200 mL

## 2017-03-14 MED ORDER — POTASSIUM CHLORIDE 20 MEQ/15ML (10%) PO SOLN
40.0000 meq | Freq: Once | ORAL | Status: AC
  Administered 2017-03-14: 40 meq
  Filled 2017-03-14: qty 30

## 2017-03-14 MED ORDER — POTASSIUM CHLORIDE 20 MEQ/15ML (10%) PO SOLN
40.0000 meq | ORAL | Status: AC
  Administered 2017-03-14 (×2): 40 meq
  Filled 2017-03-14 (×2): qty 30

## 2017-03-14 NOTE — Progress Notes (Signed)
PULMONARY / CRITICAL CARE MEDICINE   Name: Carly [name] MRN: [id number] DOB: January 11, 1971    ADMISSION DATE:  03/03/2017 CONSULTATION DATE:  6/18  REFERRING MD:  Dr. Ralene Bathe EDP  CHIEF COMPLAINT: Headache  Brief:   46 year old female with PMH as below, which is significant for HTN, GERD, and migraines admitted 6/18 for HA & focal seizure activity. CT head SAH w/ associated IVH in setting of presumed ruptured aneurysm. Developed progressive decreased LOC, vomited, postured. Intubated for airway protection and IVC placed per neuro-surg.    STUDIES:  CTA 6/18: Ruptured intracranial aneurysm with hemorrhage into the left inferior frontal gyrus and then into the ventricular system via of the left lateral ventricle. 2. Positive for active extravasation of hemorrhage from a superiorly directed left supraclinoid ICA aneurysm (~5 x 3 mm).4. 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 5. Mild ventriculomegaly/transependymal edema. Absence subarachnoid hemorrhage at this time, and basilar cisterns remain patent. 6. Tortuous carotid arteries. No atherosclerosis or stenosis   CXR 6/22 > New extensive right lung airspace diease   CTA Head/Neck 6/22 >>1. Satisfactory post coil embolization appearance of the left supraclinoid ICA aneurysm. 2. Continued intracranial artery vasospasm suspected, but improved caliber and enhancement of the bilateral ACAs, and perhaps also the right MCA branches. Left MCA and bilateral PCA Vasa spasm appears unchanged. 3. Questionable new high-grade stenosis at the left vertebral artery origin. The left vertebral artery otherwise appears stable and within normal limits throughout its course. It functionally terminates in PICA.   CT Chest 6/22 >>  Multifocal airspace opacities, most confluent throughout the right lung and in the lower lobes concerning for multifocal pneumonia.  CULTURES: Sputum 6/21 >>>MSSA Sputum 6/22 >>MSSA Blood 6/22 >>  ng  ANTIBIOTICS: Vancomycin 6/22 >>6/23 zyvox 6/23>>> 6/25 Zosyn 6/22 >> 6/25 Ancef 6/25 >>  SIGNIFICANT EVENTS: 6/18 > Presented to ED with [facility] 6/18 vstomy >> 6/18 coiling of left ICA aneurysm 6/27 off NMB  LINES/TUBES: oett 6/18>>> PICC 6/21 >>    SUBJECTIVE:  Remains Critically ill, sedated defervesced Down to 20 mcg levo gtt Good UO but pos balance   VITAL SIGNS: BP 120/61   Pulse 64   Temp 98.4 F (36.9 C) (Rectal) Comment (Src): cooling blanket  Resp (!) 28   Ht 5\' 7"  (1.702 m)   Wt 257 lb 4.4 oz (116.7 kg)   LMP  (LMP Unknown) Comment: pt is unresponsive, and is unable to communicate  SpO2 98%   BMI 40.30 kg/m   HEMODYNAMICS:    VENTILATOR SETTINGS: Vent Mode: PRVC FiO2 (%):  [60 %-100 %] 70 % Set Rate:  [28 bmp] 28 bmp Vt Set:  [400 mL] 400 mL PEEP:  [16 cmH20] 16 cmH20 Plateau Pressure:  [21 cmH20-32 cmH20] 31 cmH20  INTAKE / OUTPUT: I/O last 3 completed shifts: In: 6215.5 [I.V.:2744.8; NFAOZ:308; MV/HQ:4696.2; IV Piggyback:100] Out: 9528 [Urine:4890; Drains:269; Stool:175]  Physical Exam  Constitutional: She appears well-developed and well-nourished. No distress. She is sedated and intubated.  HENT:  Head: Normocephalic and atraumatic.  Cardiovascular: Normal rate, regular rhythm and normal heart sounds.   Pulmonary/Chest: She is intubated. No respiratory distress. She has decreased breath sounds.  Abdominal: Soft. Normal appearance and bowel sounds are normal. There is no tenderness.  Genitourinary:  Genitourinary Comments: Clear yellow but minimal urine output   Musculoskeletal: She exhibits edema.  Neurological:  RASS-5 , opens eyes to pain, ventric  Skin: Skin is warm. No rash noted. She is not diaphoretic.  LABS:  BMET  Recent Labs Lab 03/13/17 0354 03/13/17 1700 03/14/17 0430  NA 150* 148* 144  K 3.0* 3.0* 3.1*  CL 93* 97* 93*  CO2 44* 40* 41*  BUN 15 17 17   CREATININE 1.12* 1.03* 0.95  GLUCOSE 138* 158* 158*     Electrolytes  Recent Labs Lab 03/12/17 0449  03/13/17 0354 03/13/17 1700 03/14/17 0430  CALCIUM 8.3*  < > 8.1* 8.2* 8.3*  MG 1.7  --  1.9  --  1.9  PHOS 1.3*  --  4.4  --  2.9  < > = values in this interval not displayed.  CBC  Recent Labs Lab 03/12/17 0449 03/13/17 0354 03/14/17 0430  WBC 14.8* 19.0* 21.0*  HGB 8.8* 8.4* 8.1*  HCT 27.7* 26.5* 26.7*  PLT 132* 124* 128*    Coag's No results for input(s): APTT, INR in the last 168 hours.  Sepsis Markers  Recent Labs Lab 03/08/17 0420  PROCALCITON 13.36    ABG  Recent Labs Lab 03/12/17 1100 03/13/17 0410 03/14/17 0320  PHART 7.434 7.395 7.383  PCO2ART 69.6* 78.8* 74.9*  PO2ART 96.6 213* 164*    Liver Enzymes  Recent Labs Lab 03/08/17 1601 03/09/17 0453  AST 58* 102*  ALT 10* 57*  ALKPHOS 85 86  BILITOT 1.2 1.1  ALBUMIN 2.4* 2.1*    Cardiac Enzymes  Recent Labs Lab 03/07/17 1043  TROPONINI 0.36*    Glucose  Recent Labs Lab 03/13/17 1527 03/13/17 1535 03/13/17 1940 03/13/17 2351 03/14/17 0438 03/14/17 0752  GLUCAP 165* 142* 144* 119* 152* 153*    Imaging Dg Chest Port 1 View  Result Date: 03/14/2017 CLINICAL DATA:  Intubated.  Ventilator support. EXAM: PORTABLE CHEST 1 VIEW COMPARISON:  03/13/2017 and previous FINDINGS: Endotracheal tube tip is 3 cm above the carina. Soft feeding tube enters the abdomen. Right arm PICC tip in the SVC 2 cm above the right atrium. Bilateral pneumonia. Improving pattern on the right. Some worsening in the left lower lung. Disease is now essentially symmetric right to left. The may be a small amount of accumulating pleural fluid. IMPRESSION: Lines and tubes well positioned. Continued trend of improving pulmonary infiltrate on the right. Some worsening of infiltrate in the left lower lung. Electronically Signed   By: Nelson Chimes M.D.   On: 03/14/2017 07:38     ASSESSMENT / PLAN:  Acute encephalopathy High grade SAH w/ IVH s/p IVC placed in ED  6/18.  Aneurysm coiled 6/18. Vasospasm  Plan:   Ct keppra IVC per neuro Ct modified HHH therapy   Maintain SBP 140 & above dc'd nimodipine 6/28 since this was dropping BP   ARDS/ Acute hypoxic and hypercarbic respiratory failure - MSSA HCAP and volume overload  6/24 densely consolidated on right. Improving CXR with neg balance Plan Full vent support - ct lung protective Tv , drop FIO2, once down to 50%,  can start dropping PEEP    Staph HCAP Also group B strep PNA  - Ct ancef x 7-10 ds  Circulatory shock, favor sepsis and cardiogenic from acidosis  Plan Taper pressors - off neo &  Would dc vaso once levo down to 20 mcg & lower  SBP of 140 & above acceptable    AKI-->Likely d/t contrast and vanc , improved Metabolic acidosis  Severe hypokalemia / hypophos/ hypomag  Plan:   Not good response with lasix 40 q 12h - put back on lasix gtt Replace & repeat K  Check lytes twice daily  Dysphagia  Nutrition Needs  Plan:   Ct  TFs to goal ppi  Anemia of critical illness plan  Transfuse per protocol  SCDs  Hyperglycemia  Plan cbg q 4 ssi  Family - DNR issued, husband updated daily   DISCUSSION: SAH, with vasospasm on CT but not on TCDs.  Also ARDS/ MSSA pna , AKI --resolved Volume management is challenging - need negative for ARDS but need to be well hydrated for [facility] therapy - ct to aim for net negative to improve hypoxia with lasix gtt   My cc time 35 minutes  Kara Mead MD. [facility]. [place] [facility] Pager [phone] If no response call [phone]   03/14/2017

## 2017-03-14 NOTE — Progress Notes (Signed)
Transcranial Doppler  Date POD PCO2 HCT BP  MCA ACA PCA OPHT SIPH VERT Basilar  03/04/17 vs     Right  Left   57  71   -45  -45   53  67   16  14   *  51   *  *   *  *    03/07/17 vs/ je     Right  Left   *  *   *  *   *  *   12  8   *  *   *  *   *      6/25 je  45.7 25.8 135/63 Right  Left   63  85   -64  -68   *  *   21  25   -66  -92   -38  -50   *       6/27/18rs     Right  Left   53  *   -45  *   *  *   16  30   63  70   -61  *   -41       03/14/17 MS     Right  Left   *  25   -40  -38   *  *   14  18   *  59   -39  -25     -48         Right  Left                                            Right  Left                                        MCA = Middle Cerebral Artery      OPHT = Opthalmic Artery     BASILAR = Basilar Artery   ACA = Anterior Cerebral Artery     SIPH = Carotid Siphon PCA = Posterior Cerebral Artery   VERT = Verterbral Artery                 Left Lindegaard ratio 3.07 Right lindegaard ratio 1.63 Normal MCA = 62+\-12 ACA = 50+\-12 PCA = 42+\-23   03/04/17 Unable to obtain the vertebral due to ET tube stabilizer 6/22 very limited study due to patient constant shaking/ tensing up. je/vs 6/25 very poor windows- had to increase gain for all images to visualize waveform. Patient's BP has be up and down. Unsure of accuracy due to abnormalities of BP. Lindegaard ratio: right 1.5 left 1.2. JE 6/27 - Limited study on the left side due to poor window from position of patient.  (*) not insonated.  RDS 6/29- *Unable to insonate due to poor acoustic windows. MS  03/14/2017 9:48 AM Maudry Mayhew, BS, RVT, RDCS, RDMS

## 2017-03-14 NOTE — Progress Notes (Signed)
No issues overnight.  EXAM:  BP 120/61   Pulse 64   Temp 98.4 F (36.9 C) (Rectal) Comment (Src): cooling blanket  Resp (!) 28   Ht 5\' 7"  (1.702 m)   Wt 116.7 kg (257 lb 4.4 oz)   LMP  (LMP Unknown) Comment: pt is unresponsive, and is unable to communicate  SpO2 98%   BMI 40.30 kg/m   Bilateral eye opening to pain Pupils sluggish No motor response  IMPRESSION/PLAN Continue current management

## 2017-03-14 NOTE — Progress Notes (Signed)
eLink Physician-Brief Progress Note Patient Name: Carly [name] DOB: 11/07/1970 MRN: [id number]   Date of Service  03/14/2017  HPI/Events of Note  Hypokalemia  eICU Interventions  Potassium replaced     Intervention Category Intermediate Interventions: Electrolyte abnormality - evaluation and management  DETERDING,ELIZABETH 03/14/2017, 3:51 AM

## 2017-03-15 ENCOUNTER — Inpatient Hospital Stay (HOSPITAL_COMMUNITY): Payer: 59

## 2017-03-15 DIAGNOSIS — Z9289 Personal history of other medical treatment: Secondary | ICD-10-CM

## 2017-03-15 DIAGNOSIS — Z01818 Encounter for other preprocedural examination: Secondary | ICD-10-CM

## 2017-03-15 LAB — GLUCOSE, CAPILLARY
GLUCOSE-CAPILLARY: 137 mg/dL — AB (ref 65–99)
Glucose-Capillary: 144 mg/dL — ABNORMAL HIGH (ref 65–99)
Glucose-Capillary: 147 mg/dL — ABNORMAL HIGH (ref 65–99)
Glucose-Capillary: 159 mg/dL — ABNORMAL HIGH (ref 65–99)
Glucose-Capillary: 148 mg/dL — ABNORMAL HIGH (ref 65–99)
Glucose-Capillary: 153 mg/dL — ABNORMAL HIGH (ref 65–99)

## 2017-03-15 LAB — BLOOD GAS, ARTERIAL
Acid-Base Excess: 18.4 mmol/L — ABNORMAL HIGH (ref 0.0–2.0)
Bicarbonate: 43.7 mmol/L — ABNORMAL HIGH (ref 20.0–28.0)
Drawn by: 44135
FIO2: 50
LHR: 28 {breaths}/min
O2 SAT: 98.1 %
PATIENT TEMPERATURE: 98.6
PCO2 ART: 61.7 mmHg — AB (ref 32.0–48.0)
PEEP: 8 cmH2O
PH ART: 7.464 — AB (ref 7.350–7.450)
PO2 ART: 99.4 mmHg (ref 83.0–108.0)
VT: 400 mL

## 2017-03-15 LAB — BASIC METABOLIC PANEL
Anion gap: 13 (ref 5–15)
BUN: 20 mg/dL (ref 6–20)
CALCIUM: 8.5 mg/dL — AB (ref 8.9–10.3)
CO2: 41 mmol/L — AB (ref 22–32)
Chloride: 89 mmol/L — ABNORMAL LOW (ref 101–111)
Creatinine, Ser: 0.88 mg/dL (ref 0.44–1.00)
GFR calc Af Amer: >60 mL/min (ref >60)
GLUCOSE: 196 mg/dL — AB (ref 65–99)
Potassium: 3 mmol/L — ABNORMAL LOW (ref 3.5–5.1)
Sodium: 143 mmol/L (ref 135–145)

## 2017-03-15 LAB — CBC
HEMATOCRIT: 31.3 % — AB (ref 36.0–46.0)
Hemoglobin: 9.5 g/dL — ABNORMAL LOW (ref 12.0–15.0)
MCH: 26.5 pg (ref 26.0–34.0)
MCHC: 30.4 g/dL (ref 30.0–36.0)
MCV: 87.2 fL (ref 78.0–100.0)
Platelets: 147 10*3/uL — ABNORMAL LOW (ref 150–400)
RBC: 3.59 MIL/uL — ABNORMAL LOW (ref 3.87–5.11)
RDW: 14.9 % (ref 11.5–15.5)
WBC: 18.2 10*3/uL — AB (ref 4.0–10.5)

## 2017-03-15 LAB — MAGNESIUM: Magnesium: 2.1 mg/dL (ref 1.7–2.4)

## 2017-03-15 LAB — PHOSPHORUS: Phosphorus: 3.1 mg/dL (ref 2.5–4.6)

## 2017-03-15 MED ORDER — POTASSIUM CHLORIDE 20 MEQ/15ML (10%) PO SOLN
40.0000 meq | Freq: Two times a day (BID) | ORAL | Status: AC
  Administered 2017-03-15 (×2): 40 meq
  Filled 2017-03-15 (×2): qty 30

## 2017-03-15 MED ORDER — LOPERAMIDE HCL 1 MG/5ML PO LIQD
2.0000 mg | ORAL | Status: DC | PRN
  Administered 2017-03-16 – 2017-03-25 (×5): 2 mg
  Filled 2017-03-15 (×5): qty 10

## 2017-03-15 NOTE — Progress Notes (Signed)
   Patient on levopjhed per RN -> goal sbp 140-160 Now past few hours havin variable sbp 120-200 (femoral a line originally placed 03/07/17) Cuff pressure ok per RN  Plan Dc aline Go with bp cuff Bedside MD > 7a to reassess  PS: unable to camera in   Dr. Brand Males, M.D., [facility].C.P Pulmonary and Critical Care Medicine Staff Physician [place] [facility] Pager: [phone], If no answer or between  15:00h - 7:00h: call [phone]  03/15/2017 3:48 AM

## 2017-03-15 NOTE — Progress Notes (Signed)
Noted pt desat 84-85% on 40% fio2 & pt also becoming agitated/coughing.  Fio2 increased to 50%.  While I was at bedside w/ RN, pt started vomiting bright yellow bile/vomit.  I immediately suctioned orally to clear vomit, then suctioned OET.  Secretions from OET appeared white/tan w/ sm. Pink tinged- no bright yellow secretions noted.  RN at bedside and aware.  Sat 95-96% on 50% fio2.

## 2017-03-15 NOTE — Progress Notes (Signed)
PULMONARY / CRITICAL CARE MEDICINE   Name: Carly [name] MRN: [id number] DOB: 1971-04-07    ADMISSION DATE:  03/03/2017 CONSULTATION DATE:  6/18  REFERRING MD:  Dr. Ralene Bathe EDP  CHIEF COMPLAINT: Headache  Brief:   46 year old female with PMH as below, which is significant for HTN, GERD, and migraines admitted 6/18 for HA & focal seizure activity. CT head SAH w/ associated IVH in setting of presumed ruptured aneurysm. Developed progressive decreased LOC, vomited, postured. Intubated for airway protection and IVC placed per neuro-surg.    STUDIES:  CTA 6/18: Ruptured intracranial aneurysm with hemorrhage into the left inferior frontal gyrus and then into the ventricular system via of the left lateral ventricle. 2. Positive for active extravasation of hemorrhage from a superiorly directed left supraclinoid ICA aneurysm (~5 x 3 mm).4. 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 5. Mild ventriculomegaly/transependymal edema. Absence subarachnoid hemorrhage at this time, and basilar cisterns remain patent. 6. Tortuous carotid arteries. No atherosclerosis or stenosis   CXR 6/30 >NSC  CTA Head/Neck 6/22 >>1. Satisfactory post coil embolization appearance of the left supraclinoid ICA aneurysm. 2. Continued intracranial artery vasospasm suspected, but improved caliber and enhancement of the bilateral ACAs, and perhaps also the right MCA branches. Left MCA and bilateral PCA Vasa spasm appears unchanged. 3. Questionable new high-grade stenosis at the left vertebral artery origin. The left vertebral artery otherwise appears stable and within normal limits throughout its course. It functionally terminates in PICA.   CT Chest 6/22 >>  Multifocal airspace opacities, most confluent throughout the right lung and in the lower lobes concerning for multifocal pneumonia.  CULTURES: Sputum 6/21 >>>MSSA Sputum 6/22 >>MSSA Blood 6/22 >> ng  ANTIBIOTICS: Vancomycin 6/22  >>6/23 zyvox 6/23>>> 6/25 Zosyn 6/22 >> 6/25 Ancef 6/25 >>  SIGNIFICANT EVENTS: 6/18 > Presented to ED with [facility] 6/18 vstomy >> 6/18 coiling of left ICA aneurysm 6/27 off NMB 6/29 fio2 down  LINES/TUBES: oett 6/18>>> PICC 6/21 >>    SUBJECTIVE:  Remains Critically ill, sedated Pressor needs down Neg 4 litres  Fio2 50 % peep decreased to 5   VITAL SIGNS: BP (!) 142/63   Pulse 67   Temp 99.2 F (37.3 C)   Resp (!) 9   Ht 5\' 7"  (1.702 m)   Wt 249 lb 1.9 oz (113 kg)   LMP  (LMP Unknown) Comment: pt is unresponsive, and is unable to communicate  SpO2 99%   BMI 39.02 kg/m   HEMODYNAMICS:    VENTILATOR SETTINGS: Vent Mode: PRVC FiO2 (%):  [50 %] 50 % Set Rate:  [28 bmp] 28 bmp Vt Set:  [400 mL] 400 mL PEEP:  [8 cmH20-14 cmH20] 8 cmH20 Plateau Pressure:  [20 cmH20-27 cmH20] 20 cmH20  INTAKE / OUTPUT: I/O last 3 completed shifts: In: 5506.6 [I.V.:1811.6; Other:425; NG/GT:2870; IV Piggyback:400] Out: 8653 [SNKNL:9767; Drains:253; Stool:210]  Physical Exam  General:  WNWDAAF HEENT: OTT/OGT . PERL with open eyes with upward gaze preference PSY:NA Neuro: No follows commands. +doll's eyes CV: HSR ST PULM: Decreased air movement with coarse rhonchi HA:LPFX, non-tender, bsx4 active +bs exts: warm/dry, + edema  Skin: no rashes or lesions  LABS:  BMET  Recent Labs Lab 03/14/17 0430 03/14/17 1730 03/15/17 0359  NA 144 141 143  K 3.1* 3.4* 3.0*  CL 93* 90* 89*  CO2 41* 41* 41*  BUN 17 17 20   CREATININE 0.95 0.82 0.88  GLUCOSE 158* 145* 196*    Electrolytes  Recent Labs  Lab 03/13/17 0354  03/14/17 0430 03/14/17 1730 03/15/17 0359  CALCIUM 8.1*  < > 8.3* 8.3* 8.5*  MG 1.9  --  1.9  --  2.1  PHOS 4.4  --  2.9  --  3.1  < > = values in this interval not displayed.  CBC  Recent Labs Lab 03/13/17 0354 03/14/17 0430 03/15/17 0359  WBC 19.0* 21.0* 18.2*  HGB 8.4* 8.1* 9.5*  HCT 26.5* 26.7* 31.3*  PLT 124* 128* 147*    Coag's No results  for input(s): APTT, INR in the last 168 hours.  Sepsis Markers No results for input(s): LATICACIDVEN, PROCALCITON, O2SATVEN in the last 168 hours.  ABG  Recent Labs Lab 03/13/17 0410 03/14/17 0320 03/15/17 0330  PHART 7.395 7.383 7.464*  PCO2ART 78.8* 74.9* 61.7*  PO2ART 213* 164* 99.4    Liver Enzymes  Recent Labs Lab 03/08/17 1601 03/09/17 0453  AST 58* 102*  ALT 10* 57*  ALKPHOS 85 86  BILITOT 1.2 1.1  ALBUMIN 2.4* 2.1*    Cardiac Enzymes No results for input(s): TROPONINI, PROBNP in the last 168 hours.  Glucose  Recent Labs Lab 03/14/17 0752 03/14/17 1144 03/14/17 1538 03/14/17 2000 03/14/17 2349 03/15/17 0413  GLUCAP 153* 134* 140* 153* 157* 144*    Imaging No results found.   ASSESSMENT / PLAN:  Acute encephalopathy High grade SAH w/ IVH s/p IVC placed in ED 6/18.  Aneurysm coiled 6/18. Vasospasm  Plan:   Ct keppra IVC per neuro Ct modified HHH therapy   Maintain SBP 140 & above dc'd nimodipine 6/28 since this was dropping BP   ARDS/ Acute hypoxic and hypercarbic respiratory failure - MSSA HCAP and volume overload  6/24 densely consolidated on right. Improving CXR with neg balance Plan Full vent support - ct lung protective Tv , drop FIO2, once down to 50%,  can start dropping PEEP, 6/30 fio2 now 50% therefore decreased peep to 5    Staph HCAP Also group B strep PNA  - Ct ancef x 7-10 ds day #5  Circulatory shock, favor sepsis and cardiogenic from acidosis  Plan Taper pressors - off neo &  Would dc vaso once levo down to 20 mcg & lower  SBP of 140 & above acceptable    AKI-->Likely d/t contrast and vanc , improved Metabolic acidosis  Severe hypokalemia / hypophos/ hypomag Lab Results  Component Value Date   CREATININE 0.88 03/15/2017   CREATININE 0.82 03/14/2017   CREATININE 0.95 03/14/2017    Recent Labs Lab 03/14/17 0430 03/14/17 1730 03/15/17 0359  K 3.1* 3.4* 3.0*     Plan:   6/30 4litres neg in 24  hours of lasix drip. Will dc lasix with profound pulmonary improvement. Continue free h20 x 24 hours and reevaluate  Replace & repeat K  Check lytes twice daily   Dysphagia  Nutrition Needs  Plan:   Ct  TFs to goal ppi  Anemia of critical illness  Recent Labs  03/14/17 0430 03/15/17 0359  HGB 8.1* 9.5*    plan  Transfuse per protocol  SCDs  Hyperglycemia  CBG (last 3)   Recent Labs  03/14/17 2000 03/14/17 2349 03/15/17 0413  GLUCAP 153* 157* 144*     Plan cbg q 4 ssi  Family - DNR issued, husband updated daily   Intake/Output Summary (Last 24 hours) at 03/15/17 0826 Last data filed at 03/15/17 0800  Gross per 24 hour  Intake          3528.78 ml  Output             7607 ml  Net         -4078.22 ml   DISCUSSION: SAH, with vasospasm on CT but not on TCDs.  Also ARDS/ MSSA pna , AKI --resolved and on lasic drip Volume management is challenging - need negative for ARDS but need to be well hydrated for [facility] therapy - ct to aim for net negative to improve hypoxia with lasix gtt. 6/30 pulmonary mechanics improved with net negative 4 litres. Will dc lasix drip and continue to balance [place] therapy with ARDS treatment.   Appcc time 40 minutes   Richardson Landry Minor ACNP Maryanna Shape PCCM Pager [phone] till 3 pm If no answer page [phone] 03/15/2017, 8:25 AM   STAFF NOTE: I, Merrie Roof, MD FACP have personally reviewed patient's available data, including medical history, events of note, physical examination and test results as part of my evaluation. I have discussed with resident/NP and other care providers such as pharmacist, RN and RRT. In addition, I personally evaluated patient and elicited key findings of: not awake, not fc, barely some movement to pain stimuli, eyes deviated upward, coarse BS improved, edema, was neg 3.4 liters and has improved O2 neds substantially to peep 5 and low fio2 =- min support, I reviewed pcxr showing int prominence, and prior CT with  dense infiltrate rt c/w MSSA PNA now responding to ABX< her neuro assessment is por and outcome will be awful for functional recovery, given current improved vent and concerns vasospasm can dc lasix drip now, pcxr in am required, [place] as able, maintain MAP 80-105 as able, abg reviewed, can reduce MV slight, avoid acidosis, k supp, repeat abg in am , overall levo is down for [facility] support despite lasix needed , likley with degree of ali, plat kee less 30 as able The patient is critically ill with multiple organ systems failure and requires high complexity decision making for assessment and support, frequent evaluation and titration of therapies, application of advanced monitoring technologies and extensive interpretation of multiple databases.   Critical Care Time devoted to patient care services described in this note is 30 Minutes. This time reflects time of care of this signee: Merrie Roof, MD FACP. This critical care time does not reflect procedure time, or teaching time or supervisory time of PA/NP/Med student/Med Resident etc but could involve care discussion time. Rest per NP/medical resident whose note is outlined above and that I agree with   Lavon Paganini. Titus Mould, MD, [place] Pgr: [place] [facility] 03/15/2017 9:55 AM

## 2017-03-15 NOTE — Progress Notes (Signed)
Pt seen and examined.  No issues overnight.  EXAM: Temp:  [99.1 F (37.3 C)-101.1 F (38.4 C)] 99.2 F (37.3 C) (06/30 0430) Pulse Rate:  [60-79] 64 (06/30 0800) Resp:  [0-30] 15 (06/30 0800) BP: (100-192)/(45-116) 150/58 (06/30 0800) SpO2:  [90 %-99 %] 95 % (06/30 0800) Arterial Line BP: (137-194)/(58-79) 157/62 (06/30 0400) FiO2 (%):  [50 %] 50 % (06/30 0842) Weight:  [113 kg (249 lb 1.9 oz)] 113 kg (249 lb 1.9 oz) (06/30 0615) Intake/Output      06/29 0701 - 06/30 0700 06/30 0701 - 07/01 0700   I.V. (mL/kg) 1135.4 (10) 43.4 (0.4)   Other     NG/GT 1900 50   IV Piggyback 400    Total Intake(mL/kg) 3435.4 (30.4) 93.4 (0.8)   Urine (mL/kg/hr) 6540 (2.4) 700 (3.6)   Drains 151 (0.1) 6 (0)   Stool 210 (0.1)    Total Output 6901 706   Net -3465.6 -612.6         Bilateral eyes open Pupils sluggish Flexion of fingers and wrist b/l to pain EVD patent, draining bloody CSF  Plan Continue current care

## 2017-03-15 NOTE — Progress Notes (Signed)
After vent rate change pt with coughing and gagging on ventilator. Pt began vomiting bile that was immediately witnessed and suctioned out. Ventric with 70ml of serosanguinous fluid dumped out. No changes with neuro status. Will continue to monitor.

## 2017-03-15 NOTE — Progress Notes (Signed)
Beginning at 0200, pt's BP started fluctuating anywhere from 413'K to 440'N systolic. Attempting to titrate levo gtt accordingly hoping to return pt to goal of 027/253 systolic. By 0330, without success, continuing to decrease levo gtt and decrease stimulation of pt in attempt to get BP back to goal.   Notified on-call MD via [place] for concerns of femoral A-line no longer working appropriately - whip in wave form noted. After speaking with MD, given orders to remove femoral A-line as it has been in 8 days at this time. Will now go via the cuff pressure until reassessments can be made during rounding in the AM.   Will continue to monitor closely.  Guadalupe Maple, RN

## 2017-03-16 ENCOUNTER — Inpatient Hospital Stay (HOSPITAL_COMMUNITY): Payer: 59

## 2017-03-16 LAB — BLOOD GAS, ARTERIAL
Acid-Base Excess: 16.4 mmol/L — ABNORMAL HIGH (ref 0.0–2.0)
BICARBONATE: 41.5 mmol/L — AB (ref 20.0–28.0)
Drawn by: 418751
FIO2: 40
LHR: 22 {breaths}/min
O2 Saturation: 95 %
PEEP: 5 cmH2O
Patient temperature: 98.6
VT: 400 mL
pCO2 arterial: 59 mmHg — ABNORMAL HIGH (ref 32.0–48.0)
pH, Arterial: 7.461 — ABNORMAL HIGH (ref 7.350–7.450)
pO2, Arterial: 75.1 mmHg — ABNORMAL LOW (ref 83.0–108.0)

## 2017-03-16 LAB — CBC
HCT: 31.5 % — ABNORMAL LOW (ref 36.0–46.0)
HEMOGLOBIN: 9.6 g/dL — AB (ref 12.0–15.0)
MCH: 26.4 pg (ref 26.0–34.0)
MCHC: 30.5 g/dL (ref 30.0–36.0)
MCV: 86.5 fL (ref 78.0–100.0)
Platelets: 188 10*3/uL (ref 150–400)
RBC: 3.64 MIL/uL — ABNORMAL LOW (ref 3.87–5.11)
RDW: 14.7 % (ref 11.5–15.5)
WBC: 17.1 10*3/uL — AB (ref 4.0–10.5)

## 2017-03-16 LAB — GLUCOSE, CAPILLARY
GLUCOSE-CAPILLARY: 134 mg/dL — AB (ref 65–99)
GLUCOSE-CAPILLARY: 140 mg/dL — AB (ref 65–99)
Glucose-Capillary: 154 mg/dL — ABNORMAL HIGH (ref 65–99)
Glucose-Capillary: 139 mg/dL — ABNORMAL HIGH (ref 65–99)
Glucose-Capillary: 151 mg/dL — ABNORMAL HIGH (ref 65–99)

## 2017-03-16 LAB — BASIC METABOLIC PANEL
ANION GAP: 10 (ref 5–15)
BUN: 18 mg/dL (ref 6–20)
CALCIUM: 8.7 mg/dL — AB (ref 8.9–10.3)
CHLORIDE: 93 mmol/L — AB (ref 101–111)
CO2: 38 mmol/L — ABNORMAL HIGH (ref 22–32)
CREATININE: 0.75 mg/dL (ref 0.44–1.00)
GFR calc non Af Amer: >60 mL/min (ref >60)
Glucose, Bld: 171 mg/dL — ABNORMAL HIGH (ref 65–99)
Potassium: 3.4 mmol/L — ABNORMAL LOW (ref 3.5–5.1)
SODIUM: 141 mmol/L (ref 135–145)

## 2017-03-16 LAB — PROTIME-INR
INR: 1.15
PROTHROMBIN TIME: 14.7 s (ref 11.4–15.2)

## 2017-03-16 LAB — MAGNESIUM: MAGNESIUM: 2.2 mg/dL (ref 1.7–2.4)

## 2017-03-16 LAB — PHOSPHORUS: PHOSPHORUS: 3.1 mg/dL (ref 2.5–4.6)

## 2017-03-16 LAB — APTT: aPTT: 33 seconds (ref 24–36)

## 2017-03-16 MED ORDER — POTASSIUM CHLORIDE 20 MEQ/15ML (10%) PO SOLN
40.0000 meq | Freq: Once | ORAL | Status: AC
  Administered 2017-03-16: 40 meq
  Filled 2017-03-16: qty 30

## 2017-03-16 NOTE — Progress Notes (Signed)
Wasted 30 ml of fentanyl 10 mcg/ml with Charolotte Eke.

## 2017-03-16 NOTE — Progress Notes (Signed)
Pt seen and examined.  No issues overnight. Trial to wean vent  EXAM: Temp:  [99.1 F (37.3 C)-100.5 F (38.1 C)] 99.1 F (37.3 C) (07/01 0400) Pulse Rate:  [66-118] 73 (07/01 0835) Resp:  [13-30] 21 (07/01 0835) BP: (123-194)/(48-113) 162/68 (07/01 0715) SpO2:  [87 %-98 %] 93 % (07/01 0835) FiO2 (%):  [40 %-50 %] 40 % (07/01 0835) Weight:  [113 kg (249 lb 1.9 oz)] 113 kg (249 lb 1.9 oz) (07/01 0500) Intake/Output      06/30 0701 - 07/01 0700 07/01 0701 - 07/02 0700   I.V. (mL/kg) 638 (5.6) 30.1 (0.3)   Other 25    NG/GT 1200    IV Piggyback 300    Total Intake(mL/kg) 2163 (19.1) 30.1 (0.3)   Urine (mL/kg/hr) 3100 (1.1) 300 (1)   Drains 140 (0.1) 4 (0)   Stool 0 (0)    Total Output 3240 304   Net -1077 -274           Bilateral eyes open Pupils sluggish Flexion of fingers and wrist b/l to pain EVD patent, draining bloody CSF  Plan Continue current care

## 2017-03-16 NOTE — Progress Notes (Addendum)
PULMONARY / CRITICAL CARE MEDICINE   Name: Carly [name] MRN: [id number] DOB: 05-19-1971    ADMISSION DATE:  03/03/2017 CONSULTATION DATE:  6/18  REFERRING MD:  Dr. Ralene Bathe EDP  CHIEF COMPLAINT: Headache  Brief:   46 year old female with PMH as below, which is significant for HTN, GERD, and migraines admitted 6/18 for HA & focal seizure activity. CT head SAH w/ associated IVH in setting of presumed ruptured aneurysm. Developed progressive decreased LOC, vomited, postured. Intubated for airway protection and IVC placed per neuro-surg.    STUDIES:  CTA 6/18: Ruptured intracranial aneurysm with hemorrhage into the left inferior frontal gyrus and then into the ventricular system via of the left lateral ventricle. 2. Positive for active extravasation of hemorrhage from a superiorly directed left supraclinoid ICA aneurysm (~5 x 3 mm).4. 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 5. Mild ventriculomegaly/transependymal edema. Absence subarachnoid hemorrhage at this time, and basilar cisterns remain patent. 6. Tortuous carotid arteries. No atherosclerosis or stenosis   CXR 6/30 >NSC  CTA Head/Neck 6/22 >>1. Satisfactory post coil embolization appearance of the left supraclinoid ICA aneurysm. 2. Continued intracranial artery vasospasm suspected, but improved caliber and enhancement of the bilateral ACAs, and perhaps also the right MCA branches. Left MCA and bilateral PCA Vasa spasm appears unchanged. 3. Questionable new high-grade stenosis at the left vertebral artery origin. The left vertebral artery otherwise appears stable and within normal limits throughout its course. It functionally terminates in PICA.   CT Chest 6/22 >>  Multifocal airspace opacities, most confluent throughout the right lung and in the lower lobes concerning for multifocal pneumonia.  CULTURES: Sputum 6/21 >>>MSSA Sputum 6/22 >>MSSA Blood 6/22 >> ng  ANTIBIOTICS: Vancomycin 6/22  >>6/23 zyvox 6/23>>> 6/25 Zosyn 6/22 >> 6/25 Ancef 6/25 >>  SIGNIFICANT EVENTS: 6/18 > Presented to ED with [facility] 6/18 vstomy >> 6/18 coiling of left ICA aneurysm 6/27 off NMB 6/29 fio2 down 7/1 fio2 40 %  LINES/TUBES: oett 6/18>>> PICC 6/21 >>    SUBJECTIVE:  Remains Critically ill, sedated Pressor needs down Down to 40% fio2   VITAL SIGNS: BP (!) 162/68   Pulse 73   Temp 99.1 F (37.3 C)   Resp 19   Ht 5\' 7"  (1.702 m)   Wt 249 lb 1.9 oz (113 kg)   LMP  (LMP Unknown) Comment: pt is unresponsive, and is unable to communicate  SpO2 93%   BMI 39.02 kg/m   HEMODYNAMICS:    VENTILATOR SETTINGS: Vent Mode: PRVC FiO2 (%):  [40 %-50 %] 40 % Set Rate:  [22 bmp] 22 bmp Vt Set:  [400 mL] 400 mL PEEP:  [5 cmH20] 5 cmH20 Plateau Pressure:  [17 cmH20-19 cmH20] 18 cmH20  INTAKE / OUTPUT: I/O last 3 completed shifts: In: 3901.5 [I.V.:1176.5; Other:25; NG/GT:2300; IV Piggyback:400] Out: 7500 [Urine:7060; Drains:230; Stool:210]  Physical ExamGeneral:  WNWDAAF General:  WNWDAAF on vent HEENT: MM pink/moist, IVC in place PSY:na Neuro: NO follows commands. Upward gaze preference CV: HSR RRR PULM: Exp rhonchi MA:UQJF, non-tender, bsx4 active  Extremities: warm/dry, -edema  Skin: no rashes or lesions  LABS:  BMET  Recent Labs Lab 03/14/17 1730 03/15/17 0359 03/16/17 0551  NA 141 143 141  K 3.4* 3.0* 3.4*  CL 90* 89* 93*  CO2 41* 41* 38*  BUN 17 20 18   CREATININE 0.82 0.88 0.75  GLUCOSE 145* 196* 171*    Electrolytes  Recent Labs Lab 03/14/17 0430 03/14/17 1730 03/15/17 0359 03/16/17 0551  CALCIUM 8.3* 8.3* 8.5* 8.7*  MG 1.9  --  2.1 2.2  PHOS 2.9  --  3.1 3.1    CBC  Recent Labs Lab 03/14/17 0430 03/15/17 0359 03/16/17 0551  WBC 21.0* 18.2* 17.1*  HGB 8.1* 9.5* 9.6*  HCT 26.7* 31.3* 31.5*  PLT 128* 147* 188    Coag's No results for input(s): APTT, INR in the last 168 hours.  Sepsis Markers No results for input(s): LATICACIDVEN,  PROCALCITON, O2SATVEN in the last 168 hours.  ABG  Recent Labs Lab 03/14/17 0320 03/15/17 0330 03/16/17 0328  PHART 7.383 7.464* 7.461*  PCO2ART 74.9* 61.7* 59.0*  PO2ART 164* 99.4 75.1*    Liver Enzymes No results for input(s): AST, ALT, ALKPHOS, BILITOT, ALBUMIN in the last 168 hours.  Cardiac Enzymes No results for input(s): TROPONINI, PROBNP in the last 168 hours.  Glucose  Recent Labs Lab 03/15/17 0821 03/15/17 1158 03/15/17 1624 03/15/17 2030 03/15/17 2329 03/16/17 0438  GLUCAP 147* 137* 159* 148* 153* 154*    Imaging No results found.   ASSESSMENT / PLAN:  Acute encephalopathy High grade SAH w/ IVH s/p IVC placed in ED 6/18.  Aneurysm coiled 6/18. Vasospasm  Plan:   Ct keppra IVC per neuro Ct modified HHH therapy   Maintain SBP 140 & above dc'd nimodipine 6/28 since this was dropping BP   ARDS/ Acute hypoxic and hypercarbic respiratory failure - MSSA HCAP and volume overload  6/24 densely consolidated on right. Improving CXR with neg balance 7/1 cxr nsc Plan Full vent support - ct lung protective Tv , drop FIO2, once down to 40%,  PEEP 5, 7/1 fio2 now 40% peep  5 Will wean as tolerate but no way she can be extubated .   Staph HCAP Also group B strep PNA  - Ct ancef x 7-10 ds day #6  Circulatory shock, favor sepsis and cardiogenic from acidosis  Plan Taper pressors - off neo &  Vaso, norepi on to maintain SBP>140  SBP of 140 & above acceptable    AKI-->Likely d/t contrast and vanc ,resolved Metabolic acidosis  Severe hypokalemia / hypophos/ hypomag, resolving Lab Results  Component Value Date   CREATININE 0.75 03/16/2017   CREATININE 0.88 03/15/2017   CREATININE 0.82 03/14/2017    Recent Labs Lab 03/14/17 1730 03/15/17 0359 03/16/17 0551  K 3.4* 3.0* 3.4*     Plan:   6/30 4litres neg in 24 hours of lasix drip. Will dc lasix with profound pulmonary improvement. Continue free h20 x 24 hours and reevaluate , na is  normalizing 7/1 Replace & repeat K  Check lytes daily   Dysphagia  Nutrition Needs  Plan:   Ct  TFs to goal ppi  Anemia of critical illness  Recent Labs  03/15/17 0359 03/16/17 0551  HGB 9.5* 9.6*    plan  Transfuse per protocol  SCDs  Hyperglycemia  CBG (last 3)   Recent Labs  03/15/17 2030 03/15/17 2329 03/16/17 0438  GLUCAP 148* 153* 154*     Plan cbg q 4 ssi  Family - DNR issued, 7/1 no family at bedside   Intake/Output Summary (Last 24 hours) at 03/16/17 0819 Last data filed at 03/16/17 0700  Gross per 24 hour  Intake          2069.56 ml  Output             2534 ml  Net          -464.44 ml   DISCUSSION: SAH, with  vasospasm on CT but not on TCDs.  Also ARDS/ MSSA pna , AKI --resolved and on lasic drip Volume management is challenging - need negative for ARDS but need to be well hydrated for [facility] therapy - ct to aim for net negative to improve hypoxia with lasix gtt. 6/30 pulmonary mechanics improved with net negative 4 litres. Will dc lasix drip and continue to balance Hitchcock therapy with ARDS treatment.7/1 one episode od desaturation during the night. Remains on 50% fio2. Prognosis remains poor.   Appcc time 30 minutes   Richardson Landry Minor ACNP Maryanna Shape PCCM Pager [phone] till 3 pm If no answer page [phone] 03/16/2017, 8:19 AM   STAFF NOTE: I, Merrie Roof, MD FACP have personally reviewed patient's available data, including medical history, events of note, physical examination and test results as part of my evaluation. I have discussed with resident/NP and other care providers such as pharmacist, RN and RRT. In addition, I personally evaluated patient and elicited key findings of: not awake, not fc, eyes open to voice, lungs coarse, jvd down compared to 2 days ago, possible moves upper ext, abdo soft, pcxr with residual ALI / edenma, lasix was dc but still with some redidual affect neg 1.1 liter last 24 hours, allow pos balance, keep cooling  blanket, abg reviewed, consider MV reduction, SBT attempting, with no airway protection skills for extubation, NS to discuss trach / peg with poor quality of life vs comfort care, can perc trach bedside if needed, she is nearing day 14 on vent, k supp, feeding, immodium,no role cdiff at this stage with abdo soft and wbc downward, will need repeat coags, continued ancef to 14 days The patient is critically ill with multiple organ systems failure and requires high complexity decision making for assessment and support, frequent evaluation and titration of therapies, application of advanced monitoring technologies and extensive interpretation of multiple databases.   Critical Care Time devoted to patient care services described in this note is 30 Minutes. This time reflects time of care of this signee: Merrie Roof, MD FACP. This critical care time does not reflect procedure time, or teaching time or supervisory time of PA/NP/Med student/Med Resident etc but could involve care discussion time. Rest per NP/medical resident whose note is outlined above and that I agree with   Lavon Paganini. Titus Mould, MD, [place] Pgr: [facility] 03/16/2017 10:19 AM

## 2017-03-16 NOTE — Progress Notes (Signed)
After discussion w/ CCM- vent wean trial initiated, low VT on 5 ps.  Pt tol 12 psv well currently.  Subglottic secretions bloody this morning.  RN aware.

## 2017-03-17 ENCOUNTER — Inpatient Hospital Stay (HOSPITAL_COMMUNITY): Payer: 59

## 2017-03-17 DIAGNOSIS — I609 Nontraumatic subarachnoid hemorrhage, unspecified: Secondary | ICD-10-CM

## 2017-03-17 LAB — BASIC METABOLIC PANEL
ANION GAP: 6 (ref 5–15)
BUN: 19 mg/dL (ref 6–20)
CALCIUM: 8.3 mg/dL — AB (ref 8.9–10.3)
CO2: 36 mmol/L — ABNORMAL HIGH (ref 22–32)
Chloride: 99 mmol/L — ABNORMAL LOW (ref 101–111)
Creatinine, Ser: 0.58 mg/dL (ref 0.44–1.00)
Glucose, Bld: 170 mg/dL — ABNORMAL HIGH (ref 65–99)
POTASSIUM: 3.5 mmol/L (ref 3.5–5.1)
Sodium: 141 mmol/L (ref 135–145)

## 2017-03-17 LAB — GLUCOSE, CAPILLARY
GLUCOSE-CAPILLARY: 138 mg/dL — AB (ref 65–99)
GLUCOSE-CAPILLARY: 176 mg/dL — AB (ref 65–99)
GLUCOSE-CAPILLARY: 174 mg/dL — AB (ref 65–99)
Glucose-Capillary: 137 mg/dL — ABNORMAL HIGH (ref 65–99)
Glucose-Capillary: 178 mg/dL — ABNORMAL HIGH (ref 65–99)
Glucose-Capillary: 179 mg/dL — ABNORMAL HIGH (ref 65–99)

## 2017-03-17 LAB — MAGNESIUM: MAGNESIUM: 2.2 mg/dL (ref 1.7–2.4)

## 2017-03-17 LAB — CBC
HCT: 30 % — ABNORMAL LOW (ref 36.0–46.0)
HEMOGLOBIN: 9.1 g/dL — AB (ref 12.0–15.0)
MCH: 26.5 pg (ref 26.0–34.0)
MCHC: 30.3 g/dL (ref 30.0–36.0)
MCV: 87.2 fL (ref 78.0–100.0)
Platelets: 206 10*3/uL (ref 150–400)
RBC: 3.44 MIL/uL — AB (ref 3.87–5.11)
RDW: 15.2 % (ref 11.5–15.5)
WBC: 14.5 10*3/uL — ABNORMAL HIGH (ref 4.0–10.5)

## 2017-03-17 LAB — PHOSPHORUS: PHOSPHORUS: 3.2 mg/dL (ref 2.5–4.6)

## 2017-03-17 MED ORDER — MIDAZOLAM HCL 2 MG/2ML IJ SOLN
2.0000 mg | INTRAMUSCULAR | Status: DC | PRN
  Administered 2017-03-20 – 2017-03-21 (×5): 2 mg via INTRAVENOUS
  Filled 2017-03-17 (×6): qty 2

## 2017-03-17 MED ORDER — CHLORHEXIDINE GLUCONATE CLOTH 2 % EX PADS
6.0000 | MEDICATED_PAD | Freq: Every day | CUTANEOUS | Status: DC
  Administered 2017-03-18 – 2017-03-22 (×5): 6 via TOPICAL

## 2017-03-17 MED ORDER — FENTANYL BOLUS VIA INFUSION
100.0000 ug | INTRAVENOUS | Status: DC | PRN
  Administered 2017-03-17 – 2017-03-21 (×4): 100 ug via INTRAVENOUS
  Administered 2017-03-22: 50 ug via INTRAVENOUS
  Administered 2017-03-22 – 2017-03-23 (×2): 100 ug via INTRAVENOUS
  Filled 2017-03-17: qty 100

## 2017-03-17 MED ORDER — MIDAZOLAM HCL 2 MG/2ML IJ SOLN
INTRAMUSCULAR | Status: AC
  Filled 2017-03-17: qty 2

## 2017-03-17 MED ORDER — MIDAZOLAM HCL 2 MG/2ML IJ SOLN
2.0000 mg | Freq: Once | INTRAMUSCULAR | Status: AC
  Administered 2017-03-17: 2 mg via INTRAVENOUS

## 2017-03-17 MED ORDER — NOREPINEPHRINE BITARTRATE 1 MG/ML IV SOLN
0.0000 ug/min | INTRAVENOUS | Status: DC
  Administered 2017-03-17: 10 ug/min via INTRAVENOUS
  Administered 2017-03-17: 20 ug/min via INTRAVENOUS
  Administered 2017-03-17: 25 ug/min via INTRAVENOUS
  Administered 2017-03-17: 9 ug/min via INTRAVENOUS
  Administered 2017-03-18: 40 ug/min via INTRAVENOUS
  Administered 2017-03-18: 20 ug/min via INTRAVENOUS
  Administered 2017-03-18 (×3): 35 ug/min via INTRAVENOUS
  Administered 2017-03-18: 30 ug/min via INTRAVENOUS
  Filled 2017-03-17 (×12): qty 4

## 2017-03-17 MED ORDER — MIDAZOLAM HCL 2 MG/2ML IJ SOLN
2.0000 mg | INTRAMUSCULAR | Status: AC | PRN
  Administered 2017-03-17 – 2017-03-19 (×3): 2 mg via INTRAVENOUS
  Filled 2017-03-17 (×2): qty 2

## 2017-03-17 MED ORDER — POTASSIUM CHLORIDE 20 MEQ/15ML (10%) PO SOLN
40.0000 meq | Freq: Once | ORAL | Status: AC
  Administered 2017-03-17: 40 meq via ORAL
  Filled 2017-03-17: qty 30

## 2017-03-17 NOTE — Progress Notes (Signed)
Name: DENYM CHRISTENBERRY MRN:   [id number] DOB:   [birth year]           ADMISSION DATE:  03/03/2017 CONSULTATION DATE:  6/18  REFERRING MD:  Dr. Ralene Bathe EDP  CHIEF COMPLAINT: Headache  Brief:   46 year old female with PMH as below, which is significant for HTN, GERD, and migraines admitted 6/18 for HA & focal seizure activity. CT head SAH w/ associated IVH in setting of presumed ruptured aneurysm. Developed progressive decreased LOC, vomited, postured. Intubated for airway protection and IVC placed per neuro-surg.    STUDIES:  CTA 6/18: Ruptured intracranial aneurysm with hemorrhage into the left inferior frontal gyrus and then into the ventricular system via of the left lateral ventricle. 2. Positive for active extravasation of hemorrhage from a superiorly directed left supraclinoid ICA aneurysm (~5 x 3 mm).4. 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 5. Mild ventriculomegaly/transependymal edema. Absence subarachnoid hemorrhage at this time, and basilar cisterns remain patent. 6. Tortuous carotid arteries. No atherosclerosis or stenosis CXR 6/30 >NSC CTA Head/Neck 6/22 >>1. Satisfactory post coil embolization appearance of the left supraclinoid ICA aneurysm. 2. Continued intracranial artery vasospasm suspected, but improved caliber and enhancement of the bilateral ACAs, and perhaps also the right MCA branches. Left MCA and bilateral PCA Vasa spasm appears unchanged. 3. Questionable new high-grade stenosis at the left vertebral artery origin. The left vertebral artery otherwise appears stable and within normal limits throughout its course. It functionally terminates in PICA.  CT Chest 6/22 >  Multifocal airspace opacities, most confluent throughout the right lung and in the lower lobes concerning for multifocal pneumonia.  CULTURES: Sputum 6/21 >MSSA Sputum 6/22 >MSSA Blood 6/22 > ng  ANTIBIOTICS: Vancomycin 6/22 >6/23 zyvox 6/23 > 6/25 Zosyn  6/22 > 6/25 Ancef 6/25 >  SIGNIFICANT EVENTS: 6/18 > Presented to ED with [facility] 6/18 vstomy >> 6/18 coiling of left ICA aneurysm 6/27 off NMB 6/29 fio2 down  LINES/TUBES: oett 6/18>>> PICC 6/21 >>   SUBJECTIVE:  No events overnight. Weaning this AM  VITAL SIGNS: Temp:  [98.9 F (37.2 C)-99.8 F (37.7 C)] 99.1 F (37.3 C) (07/02 0800) Pulse Rate:  [64-102] 74 (07/02 0800) Resp:  [13-30] 21 (07/02 0730) BP: (123-184)/(42-125) 136/56 (07/02 0800) SpO2:  [94 %-99 %] 98 % (07/02 0800) FiO2 (%):  [40 %] 40 % (07/02 0740) Weight:  [112 kg (246 lb 14.6 oz)-112.3 kg (247 lb 9.2 oz)] 112 kg (246 lb 14.6 oz) (07/02 0740)  PHYSICAL EXAMINATION: General:  Adult female, no distress  Neuro:  Opens eyes spontaneously, does not follow commands, flexion noted  HEENT:  ETT in place, IVC in place  Cardiovascular:  RRR, no MRG Lungs:  Diminished breath sounds, non-labored, no wheeze   Abdomen:  Obese, non-distended, active bowel sounds  Musculoskeletal:  -edema  Skin:  Warm, dry, intact    Recent Labs Lab 03/15/17 0359 03/16/17 0551 03/17/17 0500  NA 143 141 141  K 3.0* 3.4* 3.5  CL 89* 93* 99*  CO2 41* 38* 36*  BUN 20 18 19   CREATININE 0.88 0.75 0.58  GLUCOSE 196* 171* 170*    Recent Labs Lab 03/15/17 0359 03/16/17 0551 03/17/17 0500  HGB 9.5* 9.6* 9.1*  HCT 31.3* 31.5* 30.0*  WBC 18.2* 17.1* 14.5*  PLT 147* 188 206   Dg Chest Port 1 View  Result Date: 03/17/2017 CLINICAL DATA:  Follow-up respiratory failure EXAM: PORTABLE CHEST 1 VIEW COMPARISON:  03/16/2017; 03/15/2017; 03/07/2017; chest CT -  03/07/2017 FINDINGS: Grossly unchanged cardiac silhouette and mediastinal contours given reduced lung volumes. Stable positioning of support apparatus. Pulmonary vasculature remains indistinct with cephalization of flow. Bibasilar opacities are unchanged. No new focal airspace opacities. No definite pleural effusion or pneumothorax. No acute osseus abnormalities. IMPRESSION: 1.   Stable positioning of support apparatus.  No pneumothorax. 2. Similar findings of pulmonary edema and bibasilar atelectasis without significant interval change. Electronically Signed   By: Sandi Mariscal M.D.   On: 03/17/2017 07:40   Dg Chest Port 1 View  Result Date: 03/16/2017 CLINICAL DATA:  Endotracheal placement. Respiratory distress. Pneumonia. EXAM: PORTABLE CHEST 1 VIEW COMPARISON:  03/15/2017 FINDINGS: Endotracheal tube tip is 5 cm above carina. Soft feeding tube enters the abdomen. Right chest is not completely included on the exam. Diffuse edema/ARDS pattern persists. Right arm PICC tip in the SVC just above the right atrium. IMPRESSION: Endotracheal tube tip 5 cm above the carina. Persistent edema/ ARDS pattern. Electronically Signed   By: Nelson Chimes M.D.   On: 03/16/2017 08:55    ASSESSMENT / PLAN:  Acute Encephalopathy in setting of High Grade SAH s/p IVC S/P Coiling 6/18 Vasospasm noted -Nimodipine d/c 6/28 as decreasing BP Plan  RASS: 0/-1 -Per Neurosurgery  -Wean Fentanly gtt to achieve RASS  -Versed PRN  -Continue Keppra  -IVC per Neurology   Acute Hypoxic/Hypercarbic Respiratory Failure +MSSA +Volume Overload  Staph HCAP +Group B Strep  Plan  -Vent Support -Trach 7/3  -Currently on 40/5 > Difficultly extubating due mental status  -Pulmonary Hygiene   Circulatory Shock (Sepsis vs Cardiogenic) Plan  -ICU Monitoring  -Maintain Systolic >494 per Neurosurgery  -Wean Levophed to achieve Systolic Goal (Currently on 25 mcg)  Acute Kidney Injury  D/C Lasix Gtt on 7/1 Hypokalemia/Hypophos/Hypomag Plan -Trend BMP q12h  -Replace electrolytes as needed  -Free Water 200 q8h   Anemia of Critical Illness  Plan  -Trend CBC -Maintain Hbg >7   Hyperglycemia  Plan -Trend Glucose -SSI   Nutrition Needs Plan -NPO -PPI -TF   CC Time: 34 minutes   Family Discussion: Husband at bedside. Plan to trach 7/3 unless neurosurgery states no change at rehabbing    Hayden Pedro, AGACNP-BC [place]  Pgr: [phone]  PCCM Pgr: [phone]

## 2017-03-17 NOTE — Progress Notes (Signed)
eLink Physician-Brief Progress Note Patient Name: Carly [name] DOB: 08-31-1971 MRN: [id number]   Date of Service  03/17/2017  HPI/Events of Note  Agitation - Patient biting on ETT.   eICU Interventions  Will order: 1. Increase ceiling on Fentanyl IV infusion to 40 mcg/hour. 2. Increase Fentanyl bolus to 100 mcg IV Q 30 minutes PRN agitation or sedation.      Intervention Category Minor Interventions: Agitation / anxiety - evaluation and management  Lysle Dingwall 03/17/2017, 6:38 PM

## 2017-03-17 NOTE — Progress Notes (Signed)
Transcranial Doppler  Date POD PCO2 HCT BP  MCA ACA PCA OPHT SIPH VERT Basilar  03/04/17 vs     Right  Left   57  71   -45  -45   53  67   16  14   *  51   *  *   *  *    03/07/17 vs/ je     Right  Left   *  *   *  *   *  *   12  8   *  *   *  *   *      6/25 je  45.7 25.8 135/63 Right  Left   63  85   -64  -68   *  *   21  25   -66  -92   -38  -50   *       6/27/18rs     Right  Left   53  *   -45  *   *  *   16  30   63  70   -61  *   -41       03/14/17 MS     Right  Left   *  25   -40  -38   *  *   14  18   *  59   -39  -25     -48    03/17/17 rds     Right  Left   48  *   -32  -45   *  *   26  14   53  30   -88  -17   -25           Right  Left                                        MCA = Middle Cerebral Artery      OPHT = Opthalmic Artery     BASILAR = Basilar Artery   ACA = Anterior Cerebral Artery     SIPH = Carotid Siphon PCA = Posterior Cerebral Artery   VERT = Verterbral Artery                 Left Lindegaard ratio 3.07 Right lindegaard ratio 1.63 Normal MCA = 62+\-12 ACA = 50+\-12 PCA = 42+\-23   03/04/17 Unable to obtain the vertebral due to ET tube stabilizer 6/22 very limited study due to patient constant shaking/ tensing up. je/vs 6/25 very poor windows- had to increase gain for all images to visualize waveform. Patient's BP has be up and down. Unsure of accuracy due to abnormalities of BP. Lindegaard ratio: right 1.5 left 1.2. JE 6/27 - Limited study on the left side due to poor window from position of patient.  (*) not insonated.  RDS 6/29- *Unable to insonate due to poor acoustic windows. MS 03/17/17 - (*) unable to insonate due to poor acoustic windows. RS

## 2017-03-18 ENCOUNTER — Encounter (HOSPITAL_COMMUNITY): Payer: Self-pay

## 2017-03-18 ENCOUNTER — Inpatient Hospital Stay (HOSPITAL_COMMUNITY): Payer: 59

## 2017-03-18 ENCOUNTER — Encounter (HOSPITAL_COMMUNITY): Payer: Commercial Indemnity

## 2017-03-18 DIAGNOSIS — J9600 Acute respiratory failure, unspecified whether with hypoxia or hypercapnia: Secondary | ICD-10-CM

## 2017-03-18 LAB — GLUCOSE, CAPILLARY
GLUCOSE-CAPILLARY: 187 mg/dL — AB (ref 65–99)
GLUCOSE-CAPILLARY: 161 mg/dL — AB (ref 65–99)
GLUCOSE-CAPILLARY: 148 mg/dL — AB (ref 65–99)
GLUCOSE-CAPILLARY: 192 mg/dL — AB (ref 65–99)
Glucose-Capillary: 155 mg/dL — ABNORMAL HIGH (ref 65–99)
Glucose-Capillary: 156 mg/dL — ABNORMAL HIGH (ref 65–99)

## 2017-03-18 LAB — CBC
HCT: 28.3 % — ABNORMAL LOW (ref 36.0–46.0)
Hemoglobin: 8.6 g/dL — ABNORMAL LOW (ref 12.0–15.0)
MCH: 26.9 pg (ref 26.0–34.0)
MCHC: 30.4 g/dL (ref 30.0–36.0)
MCV: 88.4 fL (ref 78.0–100.0)
PLATELETS: 241 10*3/uL (ref 150–400)
RBC: 3.2 MIL/uL — AB (ref 3.87–5.11)
RDW: 15.4 % (ref 11.5–15.5)
WBC: 12.3 10*3/uL — ABNORMAL HIGH (ref 4.0–10.5)

## 2017-03-18 LAB — BASIC METABOLIC PANEL
ANION GAP: 8 (ref 5–15)
BUN: 15 mg/dL (ref 6–20)
CALCIUM: 8.3 mg/dL — AB (ref 8.9–10.3)
CO2: 35 mmol/L — AB (ref 22–32)
Chloride: 100 mmol/L — ABNORMAL LOW (ref 101–111)
Creatinine, Ser: 0.73 mg/dL (ref 0.44–1.00)
GLUCOSE: 174 mg/dL — AB (ref 65–99)
POTASSIUM: 3.6 mmol/L (ref 3.5–5.1)
Sodium: 143 mmol/L (ref 135–145)

## 2017-03-18 LAB — MAGNESIUM: Magnesium: 2.4 mg/dL (ref 1.7–2.4)

## 2017-03-18 LAB — PHOSPHORUS: Phosphorus: 2.7 mg/dL (ref 2.5–4.6)

## 2017-03-18 MED ORDER — POTASSIUM CHLORIDE 20 MEQ/15ML (10%) PO SOLN
40.0000 meq | Freq: Once | ORAL | Status: AC
  Administered 2017-03-18: 40 meq via ORAL
  Filled 2017-03-18: qty 30

## 2017-03-18 MED ORDER — DEXTROSE 5 % IV SOLN
0.0000 ug/min | INTRAVENOUS | Status: DC
  Administered 2017-03-18: 25 ug/min via INTRAVENOUS
  Administered 2017-03-19: 20 ug/min via INTRAVENOUS
  Administered 2017-03-20 (×2): 5 ug/min via INTRAVENOUS
  Administered 2017-03-21 – 2017-03-22 (×2): 32 ug/min via INTRAVENOUS
  Administered 2017-03-22: 36 ug/min via INTRAVENOUS
  Administered 2017-03-22: 23.04 ug/min via INTRAVENOUS
  Administered 2017-03-23: 9 ug/min via INTRAVENOUS
  Filled 2017-03-18 (×10): qty 16

## 2017-03-18 NOTE — Care Management Note (Signed)
Case Management Note  Patient Details  Name: Carly [name] MRN: [id number] Date of Birth: June 06, 1971  Subjective/Objective:  Pt admitted on 03/03/17 with AMS and ICH.  PTA, pt independent, lives with spouse.                    Action/Plan: Pt currently remains sedated and intubated.  Will follow for discharge planning as pt progresses.    Expected Discharge Date:                  Expected Discharge Plan:     In-House Referral:     Discharge planning Services  CM Consult  Post Acute Care Choice:    Choice offered to:     DME Arranged:    DME Agency:     HH Arranged:    HH Agency:     Status of Service:  In process, will continue to follow  If discussed at Long Length of Stay Meetings, dates discussed:    Additional Comments:  03/18/17 J. Jovi Alvizo, RN, BSN Neuro prognosis poor, per neurosurgery MD.  Family trying to decide on goals of care.  Tracheostomy originally scheduled for today, but family has chosen to wait until Friday to make a more definitive decision.  Will continue to follow.    Reinaldo Raddle, RN, BSN  Trauma/Neuro ICU Case Manager [phone]

## 2017-03-18 NOTE — Progress Notes (Addendum)
Nutrition Follow-up  DOCUMENTATION CODES:   Obesity unspecified  INTERVENTION:   Continue: Vital High Protein @ 50 ml/hr 30 ml Prostat BID Provides: 1400 kcal, 135 grams protein, and 1003 ml free water.   NUTRITION DIAGNOSIS:   Inadequate oral intake related to inability to eat as evidenced by NPO status. Ongoing.   GOAL:   Provide needs based on ASPEN/SCCM guidelines Met.   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  Remains intubated, trach planned for today cancelled. Family deciding on direction of care.  Medications reviewed and include: MVI Labs reviewed: K+, PO4, and Magnesium are WNL CBG's: 161-148 Weight up 6 lb, pt is positive 3.7 L since admission IVC drain in place with 195 ml out  Diet Order:  Diet NPO time specified  Skin:   (Pressure injury groin)  Last BM:  7/2 420 ml + 3 unmeasured   Height:   Ht Readings from Last 1 Encounters:  03/03/17 '5\' 7"'  (1.702 m)    Weight:   Wt Readings from Last 1 Encounters:  03/18/17 244 lb 14.9 oz (111.1 kg)    Ideal Body Weight:  61.3 kg  BMI:  Body mass index is 38.36 kg/m.  Estimated Nutritional Needs:   Kcal:  1607-3710  Protein:  >/= 122 grams  Fluid:  > 2 L/day  EDUCATION NEEDS:   No education needs identified at this time  [place], [place], [place] Pager [phone] After Hours Pager

## 2017-03-18 NOTE — Progress Notes (Signed)
Name: Carly [name] MRN:   [id number] DOB:   September 12, 1971           ADMISSION DATE:  03/03/2017 CONSULTATION DATE:  6/18  REFERRING MD:  Dr. Ralene Bathe EDP  CHIEF COMPLAINT: Headache  Brief:   46 year old female with PMH as below, which is significant for HTN, GERD, and migraines admitted 6/18 for HA & focal seizure activity. CT head SAH w/ associated IVH in setting of presumed ruptured aneurysm. Developed progressive decreased LOC, vomited, postured. Intubated for airway protection and IVC placed per neuro-surg.    STUDIES:  CTA 6/18: Ruptured intracranial aneurysm with hemorrhage into the left inferior frontal gyrus and then into the ventricular system via of the left lateral ventricle. 2. Positive for active extravasation of hemorrhage from a superiorly directed left supraclinoid ICA aneurysm (~5 x 3 mm).4. 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 5. Mild ventriculomegaly/transependymal edema. Absence subarachnoid hemorrhage at this time, and basilar cisterns remain patent. 6. Tortuous carotid arteries. No atherosclerosis or stenosis CXR 6/30 >NSC CTA Head/Neck 6/22 > satisfactory post coil embolization appearance of the left supraclinoid ICA aneurysm. 2. Continued intracranial artery vasospasm suspected, but improved caliber and enhancement of the bilateral ACAs, and perhaps also the right MCA branches. Left MCA and bilateral PCA Vasa spasm appears unchanged. 3. Questionable new high-grade stenosis at the left vertebral artery origin. The left vertebral artery otherwise appears stable and within normal limits throughout its course. It functionally terminates in PICA.  CT Chest 6/22 >  Multifocal airspace opacities, most confluent throughout the right lung and in the lower lobes concerning for multifocal pneumonia.  CULTURES: Sputum 6/21 >MSSA Sputum 6/22 >MSSA Blood 6/22 > ng  ANTIBIOTICS: Vancomycin 6/22 > 6/23 zyvox 6/23 > 6/25 Zosyn  6/22 > 6/25 Ancef 6/25 >>  SIGNIFICANT EVENTS: 6/18 > Presented to ED with [facility] 6/18 vstomy >> 6/18 coiling of left ICA aneurysm 6/27 off NMB 6/29 fio2 down  LINES/TUBES: oett 6/18>>> PICC 6/21 >>   SUBJECTIVE:  No events overnight. Not weaning this AM.   VITAL SIGNS: Temp:  [98.9 F (37.2 C)-100.1 F (37.8 C)] 99.4 F (37.4 C) (07/03 0400) Pulse Rate:  [70-114] 97 (07/03 0800) Resp:  [13-32] 13 (07/03 0800) BP: (104-176)/(53-86) 155/67 (07/03 0801) SpO2:  [89 %-100 %] 96 % (07/03 0801) FiO2 (%):  [40 %] 40 % (07/03 0801) Weight:  [111.1 kg (244 lb 14.9 oz)] 111.1 kg (244 lb 14.9 oz) (07/03 0500)  PHYSICAL EXAMINATION: General:  Adult female, no distress  Neuro:  Opens eyes spontaneously, does not follow commands, flexion noted  HEENT:  ETT in place, IVC in place  Cardiovascular:  RRR, no MRG Lungs:  Diminished breath sounds, non-labored, no wheeze   Abdomen:  Obese, non-distended, active bowel sounds  Musculoskeletal:  -edema  Skin:  Warm, dry, intact    Recent Labs Lab 03/16/17 0551 03/17/17 0500 03/18/17 0430  NA 141 141 143  K 3.4* 3.5 3.6  CL 93* 99* 100*  CO2 38* 36* 35*  BUN 18 19 15   CREATININE 0.75 0.58 0.73  GLUCOSE 171* 170* 174*    Recent Labs Lab 03/16/17 0551 03/17/17 0500 03/18/17 0430  HGB 9.6* 9.1* 8.6*  HCT 31.5* 30.0* 28.3*  WBC 17.1* 14.5* 12.3*  PLT 188 206 241   Dg Chest Port 1 View  Result Date: 03/18/2017 CLINICAL DATA:  Hypoxia EXAM: PORTABLE CHEST 1 VIEW COMPARISON:  March 17, 2017 chest radiograph and chest CT March 07, 2017 FINDINGS: Endotracheal tube tip is 3.0 cm above the carina. Central catheter tip is in superior vena cava. Feeding tube of tip is below the diaphragm and not seen. No pneumothorax. There remains interstitial edema. There is patchy alveolar opacity in the bases, likely edema. Heart is mildly enlarged with pulmonary venous hypertension. No adenopathy. No evident bone lesions. IMPRESSION: Tube and catheter  positions as described without pneumothorax. Evidence a degree of congestive heart failure. Probable alveolar edema in the bases. A degree of superimposed pneumonia in the bases cannot be excluded radiographically. Stable cardiac silhouette. Electronically Signed   By: Lowella Grip III M.D.   On: 03/18/2017 07:19   Dg Chest Port 1 View  Result Date: 03/17/2017 CLINICAL DATA:  Follow-up respiratory failure EXAM: PORTABLE CHEST 1 VIEW COMPARISON:  03/16/2017; 03/15/2017; 03/07/2017; chest CT - 03/07/2017 FINDINGS: Grossly unchanged cardiac silhouette and mediastinal contours given reduced lung volumes. Stable positioning of support apparatus. Pulmonary vasculature remains indistinct with cephalization of flow. Bibasilar opacities are unchanged. No new focal airspace opacities. No definite pleural effusion or pneumothorax. No acute osseus abnormalities. IMPRESSION: 1.  Stable positioning of support apparatus.  No pneumothorax. 2. Similar findings of pulmonary edema and bibasilar atelectasis without significant interval change. Electronically Signed   By: Sandi Mariscal M.D.   On: 03/17/2017 07:40    ASSESSMENT / PLAN:  Acute Encephalopathy in setting of High Grade SAH s/p IVC S/P Coiling 6/18 Vasospasm noted -Nimodipine d/c 6/28 as decreasing BP Plan  RASS: 0/-1 -Per Neurosurgery  -Wean Fentanly gtt to achieve RASS  -Versed PRN  -Continue Keppra  -IVC per Neurology   Acute Hypoxic/Hypercarbic Respiratory Failure +MSSA +Volume Overload  Staph HCAP +Group B Strep  Plan  -Vent Support -Trach Today at 1 pm  -Currently on 40/5 > Difficultly extubating due mental status  -Pulmonary Hygiene   Circulatory Shock (Sepsis vs Cardiogenic) Plan  -ICU Monitoring  -Maintain Systolic >620 per Neurosurgery  -Wean Levophed to achieve Systolic Goal (Currently on 30 mcg)  Acute Kidney Injury > Improved  D/C Lasix Gtt on 7/1 Hypokalemia/Hypophos/Hypomag Plan -Trend BMP q12h  -Replace electrolytes as  needed  -Free Water 200 q8h   Anemia of Critical Illness  Plan  -Trend CBC -Maintain Hbg >7   Hyperglycemia  Plan -Trend Glucose -SSI   Nutrition Needs Plan -NPO -PPI -TF > Holding for Trach    Family Discussion: Husband at bedside. Plan to trach 7/3 unless neurosurgery states no change at rehabbing > waiting for neuro, planned to come by at 10 am   CC Time: 32 minutes   Hayden Pedro, AGACNP-BC [place]  Pgr: [phone]  PCCM Pgr: [phone]

## 2017-03-18 NOTE — Progress Notes (Signed)
Spoke with husband with neurosurgery. Neurosurgery overall prognosis is poor. Husband believes wife would not want to live in a nursing home with a trach without goal of recovery. Spoke to husband about the other option of diuresising patient for 24-48 hours and then extubating with understanding that if she declines we would then focus on comfort. He would like some time to think things over. Tracheostomy of 1pm cancelled.   Hayden Pedro, AGACNP-BC [facility]  Pgr: [phone]  PCCM Pgr: [phone]

## 2017-03-19 ENCOUNTER — Inpatient Hospital Stay (HOSPITAL_COMMUNITY): Payer: 59

## 2017-03-19 DIAGNOSIS — E878 Other disorders of electrolyte and fluid balance, not elsewhere classified: Secondary | ICD-10-CM

## 2017-03-19 DIAGNOSIS — I609 Nontraumatic subarachnoid hemorrhage, unspecified: Secondary | ICD-10-CM

## 2017-03-19 HISTORY — DX: Other disorders of electrolyte and fluid balance, not elsewhere classified: E87.8

## 2017-03-19 LAB — CBC
HCT: 28.3 % — ABNORMAL LOW (ref 36.0–46.0)
HEMOGLOBIN: 8.6 g/dL — AB (ref 12.0–15.0)
MCH: 26.9 pg (ref 26.0–34.0)
MCHC: 30.4 g/dL (ref 30.0–36.0)
MCV: 88.4 fL (ref 78.0–100.0)
PLATELETS: 278 10*3/uL (ref 150–400)
RBC: 3.2 MIL/uL — AB (ref 3.87–5.11)
RDW: 15.2 % (ref 11.5–15.5)
WBC: 11.1 10*3/uL — AB (ref 4.0–10.5)

## 2017-03-19 LAB — BASIC METABOLIC PANEL
ANION GAP: 7 (ref 5–15)
BUN: 12 mg/dL (ref 6–20)
CALCIUM: 8.3 mg/dL — AB (ref 8.9–10.3)
CHLORIDE: 99 mmol/L — AB (ref 101–111)
CO2: 35 mmol/L — ABNORMAL HIGH (ref 22–32)
CREATININE: 0.7 mg/dL (ref 0.44–1.00)
GFR calc non Af Amer: >60 mL/min (ref >60)
Glucose, Bld: 205 mg/dL — ABNORMAL HIGH (ref 65–99)
Potassium: 3.8 mmol/L (ref 3.5–5.1)
SODIUM: 141 mmol/L (ref 135–145)

## 2017-03-19 LAB — GLUCOSE, CAPILLARY
GLUCOSE-CAPILLARY: 165 mg/dL — AB (ref 65–99)
GLUCOSE-CAPILLARY: 146 mg/dL — AB (ref 65–99)
GLUCOSE-CAPILLARY: 121 mg/dL — AB (ref 65–99)
Glucose-Capillary: 170 mg/dL — ABNORMAL HIGH (ref 65–99)
Glucose-Capillary: 122 mg/dL — ABNORMAL HIGH (ref 65–99)

## 2017-03-19 LAB — MAGNESIUM: MAGNESIUM: 2.1 mg/dL (ref 1.7–2.4)

## 2017-03-19 LAB — PHOSPHORUS: PHOSPHORUS: 2.1 mg/dL — AB (ref 2.5–4.6)

## 2017-03-19 MED ORDER — SODIUM CHLORIDE 0.45 % IV SOLN
INTRAVENOUS | Status: DC
  Administered 2017-03-19 – 2017-03-23 (×4): via INTRAVENOUS

## 2017-03-19 MED ORDER — SODIUM CHLORIDE 0.9 % IV SOLN
1000.0000 mg | Freq: Two times a day (BID) | INTRAVENOUS | Status: DC
  Administered 2017-03-19 – 2017-04-02 (×29): 1000 mg via INTRAVENOUS
  Filled 2017-03-19 (×29): qty 10

## 2017-03-19 MED ORDER — POTASSIUM PHOSPHATES 15 MMOLE/5ML IV SOLN
10.0000 mmol | Freq: Once | INTRAVENOUS | Status: AC
  Administered 2017-03-19: 10 mmol via INTRAVENOUS
  Filled 2017-03-19: qty 3.33

## 2017-03-19 NOTE — Progress Notes (Signed)
Pt vomited.  Pt was suctioned.  Tube feeding turned off.  MD paged and made aware.  No new orders at this time.

## 2017-03-19 NOTE — Assessment & Plan Note (Signed)
Replete phos

## 2017-03-19 NOTE — Progress Notes (Signed)
No new issues or problems overnight. Patient awake but does not appear aware. Not following any commands. Minimal movement of her extremities.  Drain functioning well. Transcranial Dopplers pending.  Status post high-grade subarachnoid hemorrhage. Continue supportive efforts.

## 2017-03-19 NOTE — Progress Notes (Signed)
Name: Carly [name] MRN:   [id number] DOB:   11-02-1970           ADMISSION DATE:  03/03/2017 CONSULTATION DATE:  6/18  REFERRING MD:  Dr. Ralene Bathe EDP  CHIEF COMPLAINT: Headache  Brief:   46 year old female with PMH as below, which is significant for HTN, GERD, and migraines admitted 6/18 for HA & focal seizure activity. CT head SAH w/ associated IVH in setting of presumed ruptured aneurysm. Developed progressive decreased LOC, vomited, postured. Intubated for airway protection and IVC placed per neuro-surg.    STUDIES:  CTA 6/18: Ruptured intracranial aneurysm with hemorrhage into the left inferior frontal gyrus and then into the ventricular system via of the left lateral ventricle. 2. Positive for active extravasation of hemorrhage from a superiorly directed left supraclinoid ICA aneurysm (~5 x 3 mm).4. 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 5. Mild ventriculomegaly/transependymal edema. Absence subarachnoid hemorrhage at this time, and basilar cisterns remain patent. 6. Tortuous carotid arteries. No atherosclerosis or stenosis CXR 6/30 >NSC CTA Head/Neck 6/22 > satisfactory post coil embolization appearance of the left supraclinoid ICA aneurysm. 2. Continued intracranial artery vasospasm suspected, but improved caliber and enhancement of the bilateral ACAs, and perhaps also the right MCA branches. Left MCA and bilateral PCA Vasa spasm appears unchanged. 3. Questionable new high-grade stenosis at the left vertebral artery origin. The left vertebral artery otherwise appears stable and within normal limits throughout its course. It functionally terminates in PICA.  CT Chest 6/22 >  Multifocal airspace opacities, most confluent throughout the right lung and in the lower lobes concerning for multifocal pneumonia.  CULTURES: Sputum 6/21 >MSSA Sputum 6/22 >MSSA Blood 6/22 > ng  ANTIBIOTICS: Vancomycin 6/22 > 6/23 zyvox 6/23 > 6/25 Zosyn  6/22 > 6/25 Ancef 6/25 >>  LINES/TUBES: oett 6/18>>> PICC 6/21 >>   SIGNIFICANT EVENTS: 6/18 > Presented to ED with [facility] 6/18 vstomy >> 6/18 coiling of left ICA aneurysm 6/27 off NMB 6/29 fio2 down 7/3  = No events overnight. Not weaning this AM. Lurline Idol cancelled due to poor prognosis   SUBJECTIVE/OVERNIGHT/INTERVAL HX 03/19/17 -  Trach canceled. No change per RN. Sister at bedside. Doing PSV  VITAL SIGNS: Temp:  [99.7 F (37.6 C)-100.4 F (38 C)] 100.4 F (38 C) (07/04 0400) Pulse Rate:  [70-171] 72 (07/04 0700) Resp:  [9-23] 9 (07/04 0700) BP: (122-169)/(58-82) 135/65 (07/04 0700) SpO2:  [42 %-100 %] 100 % (07/04 0750) FiO2 (%):  [40 %] 40 % (07/04 0750) Weight:  [110.1 kg (242 lb 11.6 oz)] 110.1 kg (242 lb 11.6 oz) (07/04 0400)   EXAM  General Appearance:    Looks criticall ill OBESE - yes  Head:   EVD +  Eyes:    PERRL - yes, conjunctiva/corneas - clear       Ears:    Normal external ear canals, both ears  Nose:   NG tube - panda  Throat:  ETT TUBE - yes , OG tube - panda  Neck:   Supple,  No enlargement/tenderness/nodules     Lungs:     Clear to auscultation bilaterally, Ventilator   Synchrony - yes on PSV  Chest wall:    No deformity  Heart:    S1 and S2 normal, no murmur, CVP - no.  Pressors - no  Abdomen:     Soft, no masses, no organomegaly  Genitalia:    Not done  Rectal:   not done  Extremities:  Extremities- intact     Skin:   Intact in exposed areas . Sacral area - unknown if decube     Neurologic:   Sedation - none -> RASS - -3 . Moves all 4s - sometimes. CAM-ICU - na . Orientation - not oriented       LABS  PULMONARY  Recent Labs Lab 03/12/17 1100 03/13/17 0410 03/14/17 0320 03/15/17 0330 03/16/17 0328  PHART 7.434 7.395 7.383 7.464* 7.461*  PCO2ART 69.6* 78.8* 74.9* 61.7* 59.0*  PO2ART 96.6 213* 164* 99.4 75.1*  HCO3 44.9* 46.1* 43.6* 43.7* 41.5*  O2SAT 97.3 99.4 99.1 98.1 95.0    CBC  Recent Labs Lab 03/17/17 0500  03/18/17 0430 03/19/17 0411  HGB 9.1* 8.6* 8.6*  HCT 30.0* 28.3* 28.3*  WBC 14.5* 12.3* 11.1*  PLT 206 241 278    COAGULATION  Recent Labs Lab 03/16/17 1118  INR 1.15    CARDIAC  No results for input(s): TROPONINI in the last 168 hours. No results for input(s): PROBNP in the last 168 hours.   CHEMISTRY  Recent Labs Lab 03/15/17 0359 03/16/17 0551 03/17/17 0500 03/18/17 0430 03/19/17 0411  NA 143 141 141 143 141  K 3.0* 3.4* 3.5 3.6 3.8  CL 89* 93* 99* 100* 99*  CO2 41* 38* 36* 35* 35*  GLUCOSE 196* 171* 170* 174* 205*  BUN 20 18 19 15 12   CREATININE 0.88 0.75 0.58 0.73 0.70  CALCIUM 8.5* 8.7* 8.3* 8.3* 8.3*  MG 2.1 2.2 2.2 2.4 2.1  PHOS 3.1 3.1 3.2 2.7 2.1*   Estimated Creatinine Clearance: 111.2 mL/min (by C-G formula based on SCr of 0.7 mg/dL).   LIVER  Recent Labs Lab 03/16/17 1118  INR 1.15     INFECTIOUS No results for input(s): LATICACIDVEN, PROCALCITON in the last 168 hours.   ENDOCRINE CBG (last 3)   Recent Labs  03/18/17 2310 03/19/17 0339 03/19/17 0757  GLUCAP 156* 165* 170*         IMAGING x48h  - image(s) personally visualized  -   highlighted in bold Dg Chest Port 1 View  Result Date: 03/19/2017 CLINICAL DATA:  Hypertension, vent dependent EXAM: PORTABLE CHEST 1 VIEW COMPARISON:  03/18/2017 FINDINGS: Bilateral airspace opacities are noted, stable or slightly worsened since prior study, likely edema. Mild cardiomegaly. Support devices are unchanged. IMPRESSION: Stable or slightly worsening bilateral airspace disease, likely worsening CHF. Electronically Signed   By: Rolm Baptise M.D.   On: 03/19/2017 07:50   Dg Chest Port 1 View  Result Date: 03/18/2017 CLINICAL DATA:  Hypoxia EXAM: PORTABLE CHEST 1 VIEW COMPARISON:  March 17, 2017 chest radiograph and chest CT March 07, 2017 FINDINGS: Endotracheal tube tip is 3.0 cm above the carina. Central catheter tip is in superior vena cava. Feeding tube of tip is below the diaphragm and  not seen. No pneumothorax. There remains interstitial edema. There is patchy alveolar opacity in the bases, likely edema. Heart is mildly enlarged with pulmonary venous hypertension. No adenopathy. No evident bone lesions. IMPRESSION: Tube and catheter positions as described without pneumothorax. Evidence a degree of congestive heart failure. Probable alveolar edema in the bases. A degree of superimposed pneumonia in the bases cannot be excluded radiographically. Stable cardiac silhouette. Electronically Signed   By: Lowella Grip III M.D.   On: 03/18/2017 07:19     Active Problems:   Ruptured cerebral aneurysm (HCC)   Acute respiratory failure (HCC)   SAH (subarachnoid hemorrhage) ([place])   History of ETT   Encounter  for intubation    ASSESSMENT and PLAN  Acute respiratory failure ([place]) Doing PSV 03/19/2017 but not extubatable due to [facility] and acute encephalopathy Trach canceled 7/3 due to ppor prognosis Husband contemplating future course  P;lan PSV as tolerated in day Full vent support at night Extubation depends on goals next few days  Electrolyte imbalance Replete phos      FAMILY  - Updates: 03/19/2017 --> sister updated at bedside 03/19/2017   - Inter-disciplinary family meet or Palliative Care meeting due by:  DAy 7. Current LOS is LOS 16 days  CODE STATUS    Code Status Orders        Start     Ordered   03/09/17 1343  Do not attempt resuscitation (DNR)  Continuous     03/09/17 1343    Code Status History    Date Active Date Inactive Code Status Order ID Comments User Context   03/03/2017  2:31 PM 03/09/2017  1:43 PM Full Code [id number]  Traci Sermon, PA-C ED        DISPO Keep in ICU       The patient is critically ill with multiple organ systems failure and requires high complexity decision making for assessment and support, frequent evaluation and titration of therapies, application of advanced monitoring technologies and extensive  interpretation of multiple databases.   Critical Care Time devoted to patient care services described in this note is  30  Minutes. This time reflects time of care of this signee Dr Brand Males. This critical care time does not reflect procedure time, or teaching time or supervisory time of PA/NP/Med student/Med Resident etc but could involve care discussion time    Dr. Brand Males, M.D., [facility]C.P Pulmonary and Critical Care Medicine Staff Physician [place] [facility] Pager: [phone], If no answer or between  15:00h - 7:00h: call [phone]  03/19/2017 8:03 AM

## 2017-03-19 NOTE — Progress Notes (Signed)
Pt was seen with some fine tremor in the head.  MD notified, new order to start Conrad.  Will continue to monitor.

## 2017-03-19 NOTE — Assessment & Plan Note (Signed)
Doing PSV 03/19/2017 but not extubatable due to [facility] and acute encephalopathy Trach canceled 7/3 due to ppor prognosis Husband contemplating future course  P;lan PSV as tolerated in day Full vent support at night Extubation depends on goals next few days

## 2017-03-19 NOTE — Progress Notes (Signed)
Transcranial Doppler  Date POD PCO2 HCT BP  MCA ACA PCA OPHT SIPH VERT Basilar  03/04/17 vs     Right  Left   57  71   -45  -45   53  67   16  14   *  51   *  *   *  *    03/07/17 vs/ je     Right  Left   *  *   *  *   *  *   12  8   *  *   *  *   *      6/25 je  45.7 25.8 135/63 Right  Left   63  85   -64  -68   *  *   21  25   -66  -92   -38  -50   *       6/27/18rs     Right  Left   53  *   -45  *   *  *   16  30   63  70   -61  *   -41       03/14/17 MS     Right  Left   *  25   -40  -38   *  *   14  18   *  59   -39  -25     -48    03/17/17 rds     Right  Left   48  *   -32  -45   *  *   26  14   53  52   -40  -27   -56      03/19/17,rds     Right  Left   62  *   -25  *   *  *   18  20   71  *   -39  -36   -36       MCA = Middle Cerebral Artery      OPHT = Opthalmic Artery     BASILAR = Basilar Artery   ACA = Anterior Cerebral Artery     SIPH = Carotid Siphon PCA = Posterior Cerebral Artery   VERT = Verterbral Artery                 Left Lindegaard ratio 3.07 Right lindegaard ratio 1.63 Normal MCA = 62+\-12 ACA = 50+\-12 PCA = 42+\-23   03/04/17 Unable to obtain the vertebral due to ET tube stabilizer 6/22 very limited study due to patient constant shaking/ tensing up. je/vs 6/25 very poor windows- had to increase gain for all images to visualize waveform. Patient's BP has be up and down. Unsure of accuracy due to abnormalities of BP. Lindegaard ratio: right 1.5 left 1.2. JE 6/27 - Limited study on the left side due to poor window from position of patient.  (*) not insonated.  RDS 6/29- *Unable to insonate due to poor acoustic windows. MS 03/17/17 - (*) unable to insonate due to poor acoustic windows. RS 03/19/17 - Technically limited study due to poor windows - (*) unable to insonate on today exam. rds

## 2017-03-19 NOTE — Progress Notes (Signed)
eLink Physician-Brief Progress Note Patient Name: Carly [name] DOB: Apr 20, 1971 MRN: [id number]   Date of Service  03/19/2017  HPI/Events of Note  Enteral feeds on hold d/t vomiting earlier today. Nurse wants to know if I want to restart enteral nutrition.   eICU Interventions  Will order: 1. Continue to hold enteral nutrition for tonight and until patient re-evaluated by primary team in AM. 2. 0.45 NaCl to run IV at 50 mL/hour. 3. Portable abdominal film in AM to assess for continued ileus.      Intervention Category Major Interventions: Electrolyte abnormality - evaluation and management;Other:  Lysle Dingwall 03/19/2017, 9:59 PM

## 2017-03-20 ENCOUNTER — Inpatient Hospital Stay (HOSPITAL_COMMUNITY): Payer: 59

## 2017-03-20 LAB — GLUCOSE, CAPILLARY
GLUCOSE-CAPILLARY: 90 mg/dL (ref 65–99)
GLUCOSE-CAPILLARY: 102 mg/dL — AB (ref 65–99)
Glucose-Capillary: 114 mg/dL — ABNORMAL HIGH (ref 65–99)
Glucose-Capillary: 130 mg/dL — ABNORMAL HIGH (ref 65–99)
Glucose-Capillary: 153 mg/dL — ABNORMAL HIGH (ref 65–99)
Glucose-Capillary: 90 mg/dL (ref 65–99)
Glucose-Capillary: 99 mg/dL (ref 65–99)

## 2017-03-20 LAB — PROCALCITONIN: PROCALCITONIN: 0.34 ng/mL

## 2017-03-20 MED ORDER — VITAL HIGH PROTEIN PO LIQD
1000.0000 mL | ORAL | Status: DC
  Administered 2017-03-20 – 2017-03-23 (×4): 1000 mL
  Filled 2017-03-20 (×3): qty 1000

## 2017-03-20 NOTE — Progress Notes (Signed)
Patient's tube was withdrawn 1cm during bath due to coughing spell per RN, patient's ETT tube advanced 1cm to 23 at the lip and tube holder replaced without complication. RT will continue to monitor.

## 2017-03-20 NOTE — Progress Notes (Signed)
Tube feeds/free water/meds via tube held at this time per MD instruction d/t pt vomiting again this AM.

## 2017-03-20 NOTE — Progress Notes (Addendum)
Name: Carly [name] MRN:   [id number] DOB:   Jul 03, 1971           ADMISSION DATE:  03/03/2017 CONSULTATION DATE:  6/18  REFERRING MD:  Dr. Ralene Bathe EDP  CHIEF COMPLAINT: Headache  Brief:   46 year old female with PMH as below, which is significant for HTN, GERD, and migraines admitted 6/18 for HA & focal seizure activity. CT head SAH w/ associated IVH in setting of presumed ruptured aneurysm. Developed progressive decreased LOC, vomited, postured. Intubated for airway protection and IVC placed per neuro-surg.    STUDIES:  CTA 6/18: Ruptured intracranial aneurysm with hemorrhage into the left inferior frontal gyrus and then into the ventricular system via of the left lateral ventricle. 2. Positive for active extravasation of hemorrhage from a superiorly directed left supraclinoid ICA aneurysm (~5 x 3 mm).4. 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 5. Mild ventriculomegaly/transependymal edema. Absence subarachnoid hemorrhage at this time, and basilar cisterns remain patent. 6. Tortuous carotid arteries. No atherosclerosis or stenosis CXR 6/30 >NSC CTA Head/Neck 6/22 > satisfactory post coil embolization appearance of the left supraclinoid ICA aneurysm. 2. Continued intracranial artery vasospasm suspected, but improved caliber and enhancement of the bilateral ACAs, and perhaps also the right MCA branches. Left MCA and bilateral PCA Vasa spasm appears unchanged. 3. Questionable new high-grade stenosis at the left vertebral artery origin. The left vertebral artery otherwise appears stable and within normal limits throughout its course. It functionally terminates in PICA.  CT Chest 6/22 >  Multifocal airspace opacities, most confluent throughout the right lung and in the lower lobes concerning for multifocal pneumonia.  CULTURES: Sputum 6/21 >MSSA Sputum 6/22 >MSSA Blood 6/22 > ng  ANTIBIOTICS: Vancomycin 6/22 > 6/23 zyvox 6/23 > 6/25 Zosyn  6/22 > 6/25 Ancef 6/25 >>  SIGNIFICANT EVENTS: 6/18 > Presented to ED with [facility] 6/18 vstomy >> 6/18 coiling of left ICA aneurysm 6/27 off NMB 6/29 fio2 down  LINES/TUBES: oett 6/18>>> PICC 6/21 >>   SUBJECTIVE:  No events. Tube feeds held for vomiting  VITAL SIGNS: Temp:  [98.6 F (37 C)-100.3 F (37.9 C)] 98.6 F (37 C) (07/05 0329) Pulse Rate:  [65-96] 67 (07/05 0645) Resp:  [8-40] 11 (07/05 0645) BP: (118-199)/(51-134) 148/58 (07/05 0645) SpO2:  [93 %-100 %] 100 % (07/05 0645) FiO2 (%):  [40 %] 40 % (07/05 0400) Weight:  [234 lb 9.1 oz (106.4 kg)] 234 lb 9.1 oz (106.4 kg) (07/05 0500)  PHYSICAL EXAMINATION: Gen:      No acute distress HEENT:  EOMI, sclera anicteric Neck:     No masses; no thyromegaly. ETT in place Lungs:    Clear to auscultation bilaterally; normal respiratory effort CV:         Regular rate and rhythm; no murmurs Abd:      + bowel sounds; soft, non-tender; no palpable masses, no distension Ext:    No edema; adequate peripheral perfusion Skin:      Warm and dry; no rash Neuro: Sedated, does not follow commands   Recent Labs Lab 03/17/17 0500 03/18/17 0430 03/19/17 0411  NA 141 143 141  K 3.5 3.6 3.8  CL 99* 100* 99*  CO2 36* 35* 35*  BUN 19 15 12   CREATININE 0.58 0.73 0.70  GLUCOSE 170* 174* 205*    Recent Labs Lab 03/17/17 0500 03/18/17 0430 03/19/17 0411  HGB 9.1* 8.6* 8.6*  HCT 30.0* 28.3* 28.3*  WBC 14.5* 12.3* 11.1*  PLT 206 241 278  Dg Chest Port 1 View  Result Date: 03/19/2017 CLINICAL DATA:  Hypertension, vent dependent EXAM: PORTABLE CHEST 1 VIEW COMPARISON:  03/18/2017 FINDINGS: Bilateral airspace opacities are noted, stable or slightly worsened since prior study, likely edema. Mild cardiomegaly. Support devices are unchanged. IMPRESSION: Stable or slightly worsening bilateral airspace disease, likely worsening CHF. Electronically Signed   By: Rolm Baptise M.D.   On: 03/19/2017 07:50   Dg Abd Portable 1v  Result  Date: 03/20/2017 CLINICAL DATA:  Ileus EXAM: PORTABLE ABDOMEN - 1 VIEW COMPARISON:  None. FINDINGS: Enteric tube tip overlies expected location of the DJJ. Nonobstructive bowel gas pattern. No supine evidence of pneumoperitoneum. No definite pneumatosis or portal venous gas. Punctate phleboliths overlie the lower pelvis. Otherwise, no abnormal intra-abdominal calcifications. Limited visualization of lower thorax is normal No acute osseus abnormalities. IMPRESSION: 1. Nonobstructive bowel gas pattern. 2. Weighted enteric tube tip overlies the expected location of the DJJ. Electronically Signed   By: Sandi Mariscal M.D.   On: 03/20/2017 07:18    ASSESSMENT / PLAN:  Acute Encephalopathy in setting of High Grade SAH s/p IVC S/P Coiling 6/18 Vasospasm noted -Nimodipine d/c 6/28 as decreasing BP Plan  RASS: 0/-1 -Per Neurosurgery  - Continue Keppra - wean sedation -IVC per Neurology   Acute Hypoxic/Hypercarbic Respiratory Failure +MSSA +Volume Overload  Staph HCAP +Group B Strep  Plan  -Vent Support - Pressure support weans as tolerated -Holding off on trach until family can discuss goals of care. -Pulmonary hygiene - Still on ancef. Check Pct and stop antibiotics if it is low.   Circulatory Shock (Sepsis vs Cardiogenic) Plan  -ICU Monitoring  -Maintain Systolic >286 per Neurosurgery  -Levaphed as needed to achieve goal BP  Acute Kidney Injury > Improved  D/C Lasix Gtt on 7/1 Hypokalemia/Hypophos/Hypomag Plan -Follow urine output and Cr  Anemia of Critical Illness  Plan  -Trend CBC -Maintain Hbg >7   Hyperglycemia  Plan - SSI coverage  Nutrition Needs Plan -PPI - Restart tube feeds at 10cc/hr  Family Discussion: No family at bedside. Awaiting decision on trach  The patient is critically ill with multiple organ system failure and requires high complexity decision making for assessment and support, frequent evaluation and titration of therapies, advanced monitoring, review of  radiographic studies and interpretation of complex data.   Critical Care Time devoted to patient care services, exclusive of separately billable procedures, described in this note is 35  minutes.   Marshell Garfinkel MD [place] [facility] Pager [phone] If no answer or after 3pm call: [phone] 03/20/2017, 8:03 AM

## 2017-03-21 ENCOUNTER — Inpatient Hospital Stay (HOSPITAL_COMMUNITY): Payer: 59

## 2017-03-21 LAB — GLUCOSE, CAPILLARY
GLUCOSE-CAPILLARY: 116 mg/dL — AB (ref 65–99)
GLUCOSE-CAPILLARY: 168 mg/dL — AB (ref 65–99)
GLUCOSE-CAPILLARY: 132 mg/dL — AB (ref 65–99)
Glucose-Capillary: 96 mg/dL (ref 65–99)
Glucose-Capillary: 132 mg/dL — ABNORMAL HIGH (ref 65–99)

## 2017-03-21 LAB — PROCALCITONIN: Procalcitonin: 0.93 ng/mL

## 2017-03-21 MED ORDER — LABETALOL HCL 5 MG/ML IV SOLN
10.0000 mg | INTRAVENOUS | Status: DC | PRN
  Administered 2017-03-21: 10 mg via INTRAVENOUS
  Filled 2017-03-21: qty 4

## 2017-03-21 MED ORDER — ACETAMINOPHEN 160 MG/5ML PO SOLN
1000.0000 mg | Freq: Once | ORAL | Status: AC
  Administered 2017-03-21: 1000 mg
  Filled 2017-03-21: qty 40.6

## 2017-03-21 NOTE — Progress Notes (Signed)
No issues overnight.   EXAM:  BP (!) 114/50 Comment: levo increased  Pulse 76   Temp 100.2 F (37.9 C) (Rectal) Comment (Src): cooling blanket  Resp 19   Ht 5\' 7"  (1.702 m)   Wt 107.8 kg (237 lb 10.5 oz)   LMP  (LMP Unknown) Comment: pt is unresponsive, and is unable to communicate  SpO2 100%   BMI 37.22 kg/m   Eyes open to voice Breathing spontaneously Minimal movement to noxious stim RUE EVD patent, bloody CSF  IMPRESSION:  46 y.o. female SAH d#20, unchanged neurologically.  PLAN: - Plan per patient's husband is for extubation on Mon/Tues - Cont current mgmt for now. - SBP goal can be lowered now, just keep normotensive (120-140 mmHg)

## 2017-03-21 NOTE — Progress Notes (Signed)
Two week series of transcranial Dopplers have been completed. Any current orders will be placed on hold, please advise if more TCDs are needed.  03/21/2017 8:15 AM Maudry Mayhew, BS, RVT, RDCS, RDMS  Vascular lab- [phone]

## 2017-03-21 NOTE — Progress Notes (Signed)
eLink Physician-Brief Progress Note Patient Name: Carly [name] DOB: 08-26-1971 MRN: [id number]   Date of Service  03/21/2017  HPI/Events of Note  Remains sys 184 We are out of vaso risk time and prognosis is poor  Add labetolol  eICU Interventions       Intervention Category Major Interventions: Infection - evaluation and management  Raylene Miyamoto. 03/21/2017, 2:24 AM

## 2017-03-21 NOTE — Progress Notes (Signed)
No issues overnight.   EXAM:  BP (!) 114/50 Comment: levo increased  Pulse 76   Temp 100.2 F (37.9 C) (Rectal) Comment (Src): cooling blanket  Resp 19   Ht 5\' 7"  (1.702 m)   Wt 107.8 kg (237 lb 10.5 oz)   LMP  (LMP Unknown) Comment: pt is unresponsive, and is unable to communicate  SpO2 100%   BMI 37.22 kg/m   On 161mcg/hr fentanyl: Eyes open to voice Breathing spontaneously Not following commands, minimal motor response to pain in RUE EVD patent  IMPRESSION:  46 y.o. female [place] d# 19 s/p LICA coiling. Remains neurologically unchanged, minimally responsive. I think with her lack of significant neurologic improvement, the chance of meaningful recovery is negligible.  PLAN: - Cont current mgmt

## 2017-03-21 NOTE — Progress Notes (Signed)
Name: Carly [name] MRN:   [id number] DOB:   1971/09/03           ADMISSION DATE:  03/03/2017 CONSULTATION DATE:  6/18  REFERRING MD:  Dr. Ralene Bathe EDP  CHIEF COMPLAINT: Headache  Brief:   46 year old female with PMH as below, which is significant for HTN, GERD, and migraines admitted 6/18 for HA & focal seizure activity. CT head SAH w/ associated IVH in setting of presumed ruptured aneurysm. Developed progressive decreased LOC, vomited, postured. Intubated for airway protection and IVC placed per neuro-surg.    STUDIES:  CTA 6/18: Ruptured intracranial aneurysm with hemorrhage into the left inferior frontal gyrus and then into the ventricular system via of the left lateral ventricle. 2. Positive for active extravasation of hemorrhage from a superiorly directed left supraclinoid ICA aneurysm (~5 x 3 mm).4. 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 5. Mild ventriculomegaly/transependymal edema. Absence subarachnoid hemorrhage at this time, and basilar cisterns remain patent. 6. Tortuous carotid arteries. No atherosclerosis or stenosis CXR 6/30 >NSC CTA Head/Neck 6/22 > satisfactory post coil embolization appearance of the left supraclinoid ICA aneurysm. 2. Continued intracranial artery vasospasm suspected, but improved caliber and enhancement of the bilateral ACAs, and perhaps also the right MCA branches. Left MCA and bilateral PCA Vasa spasm appears unchanged. 3. Questionable new high-grade stenosis at the left vertebral artery origin. The left vertebral artery otherwise appears stable and within normal limits throughout its course. It functionally terminates in PICA.  CT Chest 6/22 >  Multifocal airspace opacities, most confluent throughout the right lung and in the lower lobes concerning for multifocal pneumonia.  CULTURES: Sputum 6/21 >MSSA Sputum 6/22 >MSSA Blood 6/22 > ng  ANTIBIOTICS: Vancomycin 6/22 > 6/23 zyvox 6/23 > 6/25 Zosyn  6/22 > 6/25 Ancef 6/25 >>  SIGNIFICANT EVENTS: 6/18 > Presented to ED with [facility] 6/18 vstomy >> 6/18 coiling of left ICA aneurysm 6/27 off NMB 6/29 fio2 down  LINES/TUBES: oett 6/18>>> PICC 6/21 >>   SUBJECTIVE:  Tube feeds held due to vomiting Continues to be febrile.   VITAL SIGNS: Temp:  [99.3 F (37.4 C)-103 F (39.4 C)] 100.2 F (37.9 C) (07/06 0739) Pulse Rate:  [70-108] 75 (07/06 0915) Resp:  [0-33] 16 (07/06 0915) BP: (110-196)/(49-86) 173/51 (07/06 0915) SpO2:  [92 %-100 %] 96 % (07/06 0915) FiO2 (%):  [30 %-50 %] 40 % (07/06 0739) Weight:  [237 lb 10.5 oz (107.8 kg)] 237 lb 10.5 oz (107.8 kg) (07/06 0500)  PHYSICAL EXAMINATION: Gen:      No acute distress HEENT:  EOMI, sclera anicteric, ETT in place Neck:     No masses; no thyromegaly Lungs:    Clear to auscultation bilaterally; normal respiratory effort CV:         Regular rate and rhythm; no murmurs Abd:      + bowel sounds; soft, non-tender; no palpable masses, no distension Ext:    No edema; adequate peripheral perfusion Skin:      Warm and dry; no rash Neuro: alert and oriented x 3 Psych: normal mood and affect   Recent Labs Lab 03/17/17 0500 03/18/17 0430 03/19/17 0411  NA 141 143 141  K 3.5 3.6 3.8  CL 99* 100* 99*  CO2 36* 35* 35*  BUN 19 15 12   CREATININE 0.58 0.73 0.70  GLUCOSE 170* 174* 205*    Recent Labs Lab 03/17/17 0500 03/18/17 0430 03/19/17 0411  HGB 9.1* 8.6* 8.6*  HCT 30.0* 28.3* 28.3*  WBC 14.5* 12.3* 11.1*  PLT 206 241 278   Dg Abd Portable 1v  Result Date: 03/20/2017 CLINICAL DATA:  Ileus EXAM: PORTABLE ABDOMEN - 1 VIEW COMPARISON:  None. FINDINGS: Enteric tube tip overlies expected location of the DJJ. Nonobstructive bowel gas pattern. No supine evidence of pneumoperitoneum. No definite pneumatosis or portal venous gas. Punctate phleboliths overlie the lower pelvis. Otherwise, no abnormal intra-abdominal calcifications. Limited visualization of lower thorax is normal  No acute osseus abnormalities. IMPRESSION: 1. Nonobstructive bowel gas pattern. 2. Weighted enteric tube tip overlies the expected location of the DJJ. Electronically Signed   By: Sandi Mariscal M.D.   On: 03/20/2017 07:18    ASSESSMENT / PLAN:  Acute Encephalopathy in setting of High Grade SAH s/p IVC S/P Coiling 6/18 Vasospasm  -Nimodipine d/c 6/28 as decreasing BP Plan  Per neurosurgery Continue keppra, IVC  Acute Hypoxic/Hypercarbic Respiratory Failure +MSSA +Volume Overload  Staph HCAP +Group B Strep  Plan  -Vent Support - Pressure support weans as tolerated. No trach as per husband -Pulmonary hygiene - Continue ancef as Pct is still elevated.   Circulatory Shock (Sepsis vs Cardiogenic) Plan  -ICU Monitoring  -Maintain Systolic >470 per Neurosurgery  -Levaphed as needed to achieve goal BP  Acute Kidney Injury > Improved  D/C Lasix Gtt on 7/1 Hypokalemia/Hypophos/Hypomag Plan -Follow urine output and Cr  Anemia of Critical Illness  Plan  -Trend CBC -Maintain Hbg >7   Hyperglycemia  Plan - SSI coverage  Nutrition Needs Plan -PPI - Restart tube feeds at 10cc/hr  Family Discussion: I discussed with the husband today 7/6. He does not want a trach as he is aware that it would not change the overall prognosis of neuro recovery which is poor. He is leaning toward one way extubation on Monday or Tue but is clearly struggling with the decision. I will get palliative care involved to help with the decision process.   The patient is critically ill with multiple organ system failure and requires high complexity decision making for assessment and support, frequent evaluation and titration of therapies, advanced monitoring, review of radiographic studies and interpretation of complex data.   Critical Care Time devoted to patient care services, exclusive of separately billable procedures, described in this note is 35  minutes.   Marshell Garfinkel MD [place] [facility] Pager [phone] If no answer or after 3pm call: [phone] 03/21/2017, 10:35 AM

## 2017-03-22 DIAGNOSIS — Z515 Encounter for palliative care: Secondary | ICD-10-CM

## 2017-03-22 DIAGNOSIS — J9602 Acute respiratory failure with hypercapnia: Secondary | ICD-10-CM

## 2017-03-22 LAB — GLUCOSE, CAPILLARY
GLUCOSE-CAPILLARY: 148 mg/dL — AB (ref 65–99)
GLUCOSE-CAPILLARY: 138 mg/dL — AB (ref 65–99)
GLUCOSE-CAPILLARY: 130 mg/dL — AB (ref 65–99)
GLUCOSE-CAPILLARY: 135 mg/dL — AB (ref 65–99)
Glucose-Capillary: 136 mg/dL — ABNORMAL HIGH (ref 65–99)

## 2017-03-22 LAB — PROCALCITONIN: PROCALCITONIN: 1.81 ng/mL

## 2017-03-22 MED ORDER — SODIUM CHLORIDE 0.9% FLUSH: 10.0000 mL | INTRAVENOUS | Status: DC | PRN

## 2017-03-22 MED ORDER — CHLORHEXIDINE GLUCONATE CLOTH 2 % EX PADS
6.0000 | MEDICATED_PAD | Freq: Every day | CUTANEOUS | Status: DC
  Administered 2017-03-23 – 2017-03-26 (×4): 6 via TOPICAL

## 2017-03-22 MED ORDER — SODIUM CHLORIDE 0.9% FLUSH
10.0000 mL | Freq: Two times a day (BID) | INTRAVENOUS | Status: DC
  Administered 2017-03-22 – 2017-03-25 (×4): 10 mL
  Administered 2017-03-25: 20 mL
  Administered 2017-03-26 – 2017-04-01 (×9): 10 mL
  Administered 2017-04-02: 20 mL
  Administered 2017-04-02: 10 mL

## 2017-03-22 NOTE — Consult Note (Signed)
Consultation Note Date: 03/22/2017   Patient Name: Carly [name]  DOB: [birth year]  MRN: [id number]  Age / Sex: 46 y.o., female  PCP: [name], MD Referring Physician: Consuella Lose, MD  Reason for Consultation: Establishing goals of care and Psychosocial/spiritual support  HPI/Patient Profile: 46 y.o. female  with past medical history of Hypertension, GERD, migraine headaches admitted on 03/03/2017 with headache and focal seizure activity. CT scan of the head showed subarachnoid hemorrhage with associated IVH in the setting of presumed ruptured aneurysm. Patient developed progressive decrease in level of consciousness, vomited, postured. She was intubated for airway protection and IVC placed per neurosurgery. 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 Positive for active extravasation of hemorrhage. Patient underwent embolization on 03/07/2017. Chest CT performed on 03/07/2017 concerning for multifocal pneumonia. She has remained critically ill in ICU but has been more alert over the past couple of days. She is blinking her eyes intermittently to command, seemingly responding to people's voices. Per nursing, she is weaning but is still on the ventilator at this point. She is also on a cooling blanket and she has been febrile today, remains on Levophed.   Clinical Assessment and Goals of Care: Critically ill female seen in neuro ICU. She remains intubated, on pressors, fentanyl as well as tube feedings. Her brother, and mother are at the bedside. They share that she has appeared more alert to them as well as other family members in that she is now opening her eyes to voice blinking her eyes intermittently to command. I met with patient's husband outside of the room and introduced the  concept of palliative care as an additional resource and source of support for patient and her family. Husband reiterated that his wife would not want a trach or find herself living long-term in a nursing home ventilator dependant. . Patient and family are still very hopeful and prayer and their spiritual practice is playing a large role in their decision making. Husband shares  he feels like a miracle has been delivered and that she has remained alive as long as she has, especially to see her open her eyes and see her family over the past few days  Husband is her healthcare proxy. They have a 32 yo daughter and 63 yo son    SUMMARY OF RECOMMENDATIONS   Confirmed patient is DO NOT RESUSCITATE Confirmed that her husband feels that wife would not want a trach but is hoping that she can wean on her own. The plan remains to see how patient does  and  proceed with a one way extubation Monday or Tuesday unless patient has a sharp clinical decline in the interim Patient becoming more alert could make it more difficult for family to make end-of-life decisions Palliative medicine team to stay involved in support patient and family Code Status/Advance Care Planning:  DNR    Symptom Management:   Per critical care  medicine. See MAR  Palliative Prophylaxis:   Aspiration, Bowel Regimen, Delirium Protocol, Eye Care, Frequent Pain Assessment, Oral Care and Turn Reposition  Additional Recommendations (Limitations, Scope, Preferences):  No Chemotherapy, No Radiation, No Surgical Procedures and No Tracheostomy  Psycho-social/Spiritual:   Desire for further Chaplaincy support:no  Additional Recommendations: Grief/Bereavement Support  Prognosis:   Unable to determine  Discharge Planning: To Be Determined      Primary Diagnoses: Present on Admission: **None**   I have reviewed the medical record, interviewed the patient and family, and examined the patient. The following aspects are  pertinent.  Past Medical History:  Diagnosis Date  . GERD (gastroesophageal reflux disease)   . Hypertension   . Migraines    Social History   Social History  . Marital status: Married    Spouse name: N/A  . Number of children: N/A  . Years of education: N/A   Social History Main Topics  . Smoking status: Never Smoker  . Smokeless tobacco: Never Used  . Alcohol use Yes     Comment: occ  . Drug use: No  . Sexual activity: Yes    Birth control/ protection: Other-see comments, IUD   Other Topics Concern  . None   Social History Narrative   Marital Status: Married [name])   Children:  Son (1) Daughter (1)    Pets: None   Living Situation: Lives with husband and children.   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 Diabetes Mother   . Hypertension Mother   . Hyperlipidemia Mother   . Cancer Father        colon cancer  . Diabetes Maternal Grandmother   . Hyperlipidemia Paternal Grandfather    Scheduled Meds: .  stroke: mapping our early stages of recovery book   Does not apply Once  . chlorhexidine gluconate (MEDLINE KIT)  15 mL Mouth Rinse BID  . [START ON 03/23/2017] Chlorhexidine Gluconate Cloth  6 each Topical Q0600  . feeding supplement (PRO-STAT SUGAR FREE 64)  30 mL Per Tube BID  . free water  200 mL Per Tube Q8H  . insulin aspart  0-9 Units Subcutaneous Q4H  . mouth rinse  15 mL Mouth Rinse Q2H  . multivitamin  15 mL Per Tube Daily  . pantoprazole sodium  40 mg Per Tube Daily  . sodium chloride flush  10-40 mL Intracatheter Q12H   Continuous Infusions: . sodium chloride 50 mL/hr at 03/22/17 1400  .  ceFAZolin (ANCEF) IV Stopped (03/22/17 1014)  . feeding supplement (VITAL HIGH PROTEIN) 1,000 mL (03/22/17 1400)  . fentaNYL infusion INTRAVENOUS  Stopped (03/22/17 0757)  . levETIRAcetam Stopped (03/22/17 1204)  . norepinephrine (LEVOPHED) Adult infusion 29 mcg/min (03/22/17 1411)   PRN Meds:.acetaminophen **OR** acetaminophen (TYLENOL) oral liquid 160 mg/5 mL **OR** acetaminophen, docusate, fentaNYL, labetalol, loperamide, midazolam, sodium chloride flush Medications Prior to Admission:  Prior to Admission medications   Medication Sig Start Date End Date Taking? Authorizing Provider  butalbital-acetaminophen-caffeine (FIORICET WITH CODEINE) [phone] MG capsule Take 1 capsule by mouth every 4 (four) hours as needed for headache. 02/25/17  Yes McVey, Gelene Mink, PA-C  cyclobenzaprine (FLEXERIL) 10 MG tablet Take 10 mg by mouth  at bedtime as needed for muscle spasms.   Yes [provider]  Ibuprofen (MIDOL) 200 MG CAPS Take 1-2 capsules by mouth daily as needed.   Yes [provider]  triamterene-hydrochlorothiazide (MAXZIDE-25) 37.5-25 MG per tablet Take 1 tablet by mouth daily. 07/23/13 03/03/17 Yes Zanard, Bernadene Bell, MD   No Known Allergies Review of Systems  Unable to perform ROS: Intubated    Physical Exam  Constitutional: She appears well-developed and well-nourished.  Acutely ill young female seen in ICU. She is intubated and on pressors  Cardiovascular: Normal rate.   Pulmonary/Chest:  Intubated  Genitourinary:  Genitourinary Comments: Foley and rectal tube  Neurological:  Opens her eyes to voice intermittently, blinking her eyes to command intermittently  Skin: Skin is warm and dry.  Psychiatric:  Unable to test  Nursing note and vitals reviewed.   Vital Signs: BP (!) 142/50   Pulse 82   Temp (!) 101 F (38.3 C) (Rectal) Comment: 100.7 axillary; tylenol given  Resp (!) 22   Ht _0  (1.702 m)   Wt 108 kg (238 lb 1.6 oz)   LMP  (LMP Unknown) Comment: pt is unresponsive, and is unable to communicate  SpO2 98%   BMI 37.29 kg/m  Pain Assessment: CPOT POSS *See Group Information*:  2-Acceptable,Slightly drowsy, easily aroused Pain Score: Asleep   SpO2: SpO2: 98 % O2 Device:SpO2: 98 % O2 Flow Rate: .   IO: Intake/output summary:  Intake/Output Summary (Last 24 hours) at 03/22/17 1514 Last data filed at 03/22/17 1400  Gross per 24 hour  Intake          3206.32 ml  Output             1634 ml  Net          1572.32 ml    LBM: Last BM Date: 03/20/17 Baseline Weight: Weight: 108 kg (238 lb 1.6 oz) Most recent weight: Weight: 108 kg (238 lb 1.6 oz)     Palliative Assessment/Data:   Flowsheet Rows     Most Recent Value  Intake Tab  Referral Department  Hospitalist  Unit at Time of Referral  ICU  Palliative Care Primary Diagnosis  Neurology  Date Notified  03/21/17  Palliative Care Type  New Palliative care  Reason for referral  Clarify Goals of Care  Date of Admission  03/03/17  Date first seen by Palliative Care  03/22/17  # of days Palliative referral response time  1 Day(s)  # of days IP prior to Palliative referral  18  Clinical Assessment  Palliative Performance Scale Score  20%  Pain Max last 24 hours  Not able to report  Pain Min Last 24 hours  Not able to report  Dyspnea Max Last 24 Hours  Not able to report  Dyspnea Min Last 24 hours  Not able to report  Nausea Max Last 24 Hours  Not able to report  Nausea Min Last 24 Hours  Not able to report  Anxiety Max Last 24 Hours  Not able to report  Anxiety Min Last 24 Hours  Not able to report  Other Max Last 24 Hours  Not able to report  Psychosocial & Spiritual Assessment  Palliative Care Outcomes  Patient/Family meeting held?  Yes  Who was at the meeting?  husband  Palliative Care Outcomes  Clarified goals of care  Patient/Family wishes: Interventions discontinued/not started   [place] follow-up planned  Yes, Facility      Time  In: 1300 Time Out: 1410 Time Total: 70 min Greater than 50%  of this time was spent counseling and coordinating care related to the above assessment  and plan.  Signed by: Dory Horn, NP   Please contact Palliative Medicine Team phone at [phone] for questions and concerns.  For individual provider: See Shea Evans

## 2017-03-22 NOTE — Progress Notes (Signed)
Patient ID: Carly [name], female   DOB: 12-13-1970, 46 y.o.   MRN: [id number] BP (!) 149/68 Comment: levo titrated  Pulse 81   Temp (!) 100.4 F (38 C) (Rectal)   Resp 18   Ht 5\' 7"  (1.702 m)   Wt 108 kg (238 lb 1.6 oz)   LMP  (LMP Unknown) Comment: pt is unresponsive, and is unable to communicate  SpO2 100%   BMI 37.29 kg/m  Alert, follows commands, will also nod head Ventricular catheter draining well Weak in lower extremities

## 2017-03-22 NOTE — Progress Notes (Signed)
[facility] Attending Note  ADMISSION DATE:03/03/2017  CONSULTATION DATE:03/03/2017  REFERRING MD:Dr. Ralene Bathe EDP  CHIEF COMPLAINT:Headache  Presenting HPI:  46 y.o. female with PMH which is significant for HTN, GERD, and migraines admitted 6/18 for HA &focal seizure activity. CT head SAH w/ associated IVH in setting of presumed ruptured aneurysm. Developed progressive decreased LOC, vomited, postured. Intubated for airway protection and IVC placed per neuro-surg.   Subjective:  Trickle tube feedings restarted yesterday. No acute events overnight. Still no bowel movement. Still requiring vasopressors. Fentanyl seems to help patient's cough. Not consistently following commands.   Review of Systems:  Unable to obtain given intubation & altered mentation.   Vent Mode: PRVC FiO2 (%):  [40 %-50 %] 40 % Set Rate:  [16 bmp] 16 bmp Vt Set:  [400 mL] 400 mL PEEP:  [5 cmH20] 5 cmH20 Pressure Support:  [12 cmH20] 12 cmH20 Plateau Pressure:  [16 cmH20-20 cmH20] 20 cmH20   Temp:  [99.1 F (37.3 C)-103 F (39.4 C)] 99.4 F (37.4 C) (07/07 0000) Pulse Rate:  [64-103] 76 (07/06 2030) Resp:  [9-30] 15 (07/06 2030) BP: (104-200)/(42-84) 125/62 (07/06 2030) SpO2:  [93 %-100 %] 99 % (07/06 2030) FiO2 (%):  [40 %-50 %] 40 % (07/06 2309) Weight:  [107.8 kg (237 lb 10.5 oz)] 107.8 kg (237 lb 10.5 oz) (07/06 0500)  General:  Sister at bedside. Intubated. No distress. Integument:  Warm & dry. No rash on exposed skin. HEENT:  Moist mucus memebranes. IVD in place. Endotracheal tube in place. Neurological:  Pupils symmetric. Eyes open. Doesn't follow commands or attend to voice. Musculoskeletal:  No joint effusion or erythema appreciated. Symmetric muscle bulk. Pulmonary:  Symmetric chest wall rise on ventilator. Coarse breath sounds bilaterally. Cardiovascular:  Regular rate. No appreciable JVD. Normal S1 & S2. Abdomen:  Soft. Nondistended. Hyperactive bowel  sounds.  LINES/TUBES: OETT 6/18 - 6/19 (self-extubated); 6/19 >>> IVD 6/18 >>> L FEM ART LINE 6/22 >>> RUE DL PICC 6/21 >>> NGT >>> Foley >>>  CBC Latest Ref Rng & Units 03/19/2017 03/18/2017 03/17/2017  WBC 4.0 - 10.5 K/uL 11.1(H) 12.3(H) 14.5(H)  Hemoglobin 12.0 - 15.0 g/dL 8.6(L) 8.6(L) 9.1(L)  Hematocrit 36.0 - 46.0 % 28.3(L) 28.3(L) 30.0(L)  Platelets 150 - 400 K/uL 278 241 206     BMP Latest Ref Rng & Units 03/19/2017 03/18/2017 03/17/2017  Glucose 65 - 99 mg/dL 205(H) 174(H) 170(H)  BUN 6 - 20 mg/dL _0 Creatinine 0.44 - 1.00 mg/dL 0.70 0.73 0.58  BUN/Creat Ratio 9 - 23 - - -  Sodium 135 - 145 mmol/L 141 143 141  Potassium 3.5 - 5.1 mmol/L 3.8 3.6 3.5  Chloride 101 - 111 mmol/L 99(L) 100(L) 99(L)  CO2 22 - 32 mmol/L 35(H) 35(H) 36(H)  Calcium 8.9 - 10.3 mg/dL 8.3(L) 8.3(L) 8.3(L)    Hepatic Function Latest Ref Rng & Units 03/09/2017 03/08/2017 03/03/2017  Total Protein 6.5 - 8.1 g/dL 5.3(L) 5.8(L) 6.6  Albumin 3.5 - 5.0 g/dL 2.1(L) 2.4(L) 3.9  AST 15 - 41 U/L 102(H) 58(H) 45(H)  ALT 14 - 54 U/L 57(H) 10(L) 44  Alk Phosphatase 38 - 126 U/L 86 85 77  Total Bilirubin 0.3 - 1.2 mg/dL 1.1 1.2 0.7  Bilirubin, Direct 0.0 - 0.3 mg/dL - - -    IMAGING/STUDIES: CTA HEAD/NECK 6/18: IMPRESSION: 1. 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 2.  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 3. Critical Value/emergent results were called by telephone at the time of interpretation on 03/03/2017 at 1242 hours to RN Laveda Norman in the ED who verbally acknowledged and relayed these to Dr. Quintella Reichert , who was involved in patient intubation. I also then briefly discussed the case by telephone with Dr. Juliet Rude. 4. Associated generalized intracranial artery Vasospasm, severe in the bilateral  ACAs. 5. Mild ventriculomegaly/transependymal edema. Absence subarachnoid hemorrhage at this time, and basilar cisterns remain patent. 6. Tortuous carotid arteries. No atherosclerosis or stenosis. No other intracranial aneurysm identified. CTA HEAD/NECK 6/22: IMPRESSION: 1. Satisfactory post coil embolization appearance of the left supraclinoid ICA aneurysm. 2. 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 Left MCA and bilateral PCA Vasa spasm appears unchanged. 3. Questionable new high-grade stenosis at the left vertebral artery origin. The left vertebral artery otherwise appears stable and within normal limits throughout its course. It functionally terminates in PICA. 4. Otherwise stable arterial findings in the neck. 5. Stable CT appearance of the brain since 03/05/2017. Continued large volume intraventricular hemorrhage. Stable EVD and ventricle size. Stable left inferior frontal gyrus hematoma. 6. See also Chest CT today reported separately. CT CHEST 6/22:  Multifocal airspace opacities, most confluent throughout the right lung and in the lower lobes concerning for multifocal pneumonia. Small right pleural effusion.  MICROBIOLOGY: MRSA PCR 6/18:  Negative  HIV 6/18:  Nonreactive  Blood Cultures x2 6/22:  Negative  Tracheal Aspirate Culture 6/21:  MSSA Tracheal Aspirate Culture 6/22:  MSSA BAL 6/22:  MSSA   ANTIBIOTICS: Vancomycin 6/22 - 6/23 Zyvox 6/23 - 6/25 Zosyn 6/22 - 6/25 Ancef 6/25 >>>  SIGNIFICANT EVENTS: 06/18 - Admit after presenting w/ SAH >> IVD placed & left ICA aneurysm coiled 06/19 - Self extubated but not following commands or protecting airway & on 100% NRB>>reintubated  06/22 - Bronchoscopy by PM w/ purulent secretions bilaterally  06/27 - off NMB 06/29 - Worsening hypoxia 07/04 - Tube feedings held due to N/V & Keppra IV started   07/05 - Trickle tube feedings restarted   ASSESSMENT/PLAN:  46 y.o. female with acute encephalopathy in the setting of high-grade subarachnoid hemorrhage.  1. Acute encephalopathy: Likely secondary to subarachnoid hemorrhage and neurologic injury. Minimizing sedatives. 2. Subarachnoid hemorrhage: Status post coiling. Management per neurosurgery. Continuing Keppra. 3. Shock: Sepsis versus cardiogenic source. Goal systolic blood pressure as per neurosurgery. Continuing vasopressor support. Monitoring vitals per unit protocol. 4. MSSA pneumonia: Continuing Ancef day #15. Trending Procalcitonin as per algorithm. 5. Acute renal failure: Previously improving. Repeat electrolytes and renal function with a.m. labs. Continuing to hold on further diuresis. 6. Anemia: Secondary to acute blood loss and chronic illness. Repeat CBC in a.m. to monitor cell counts. 7. Hyperglycemia: No history of diabetes mellitus. Continuing Accu-Cheks and sliding scale insulin. 8. Acute hypoxic & hypercarbic respiratory failure: Secondary to pulmonary edema as well as MSSA pneumonia. Continuing to wean ventilator support. 9. Essential hypertension: Currently hypotensive. Holding antihypertensive regimen.  Prophylaxis:  SCDs & Protonix via tube daily.  Diet:  NPO. Continuing trickle tube feedings. Code Status:  DNR per previous physician discussion.  Disposition:  As per primary service. Poor prognosis. Palliative Medicine Consulted. Plan for possible one-way extubation early this week. Family Update:  Sister updated at bedside during my rounds.   I  have personally spent a total of 31 minutes of critical care time today caring for the patient, updating sister at bedside, & reviewing the patient's electronic medical record.  Remainder of care as per primary service and other consultants.  Sonia Baller Ashok Cordia, M.D. [facility] Pager:  [phone] After 3pm or if no response, call [phone] 2:17 AM  03/22/17

## 2017-03-23 ENCOUNTER — Inpatient Hospital Stay (HOSPITAL_COMMUNITY): Payer: 59

## 2017-03-23 DIAGNOSIS — Z9911 Dependence on respirator [ventilator] status: Secondary | ICD-10-CM

## 2017-03-23 LAB — URINALYSIS, ROUTINE W REFLEX MICROSCOPIC
BILIRUBIN URINE: NEGATIVE
Glucose, UA: NEGATIVE mg/dL
Hgb urine dipstick: NEGATIVE
KETONES UR: NEGATIVE mg/dL
LEUKOCYTES UA: NEGATIVE
NITRITE: NEGATIVE
PROTEIN: NEGATIVE mg/dL
Specific Gravity, Urine: 1.012 (ref 1.005–1.030)
pH: 7 (ref 5.0–8.0)

## 2017-03-23 LAB — GLUCOSE, CAPILLARY
GLUCOSE-CAPILLARY: 117 mg/dL — AB (ref 65–99)
GLUCOSE-CAPILLARY: 121 mg/dL — AB (ref 65–99)
Glucose-Capillary: 107 mg/dL — ABNORMAL HIGH (ref 65–99)
Glucose-Capillary: 115 mg/dL — ABNORMAL HIGH (ref 65–99)
Glucose-Capillary: 118 mg/dL — ABNORMAL HIGH (ref 65–99)
Glucose-Capillary: 132 mg/dL — ABNORMAL HIGH (ref 65–99)
Glucose-Capillary: 145 mg/dL — ABNORMAL HIGH (ref 65–99)

## 2017-03-23 LAB — CBC WITH DIFFERENTIAL/PLATELET
BASOS ABS: 0 10*3/uL (ref 0.0–0.1)
BASOS PCT: 0 %
Eosinophils Absolute: 0.2 10*3/uL (ref 0.0–0.7)
Eosinophils Relative: 2 %
HEMATOCRIT: 26.4 % — AB (ref 36.0–46.0)
HEMOGLOBIN: 8 g/dL — AB (ref 12.0–15.0)
LYMPHS PCT: 16 %
Lymphs Abs: 1.6 10*3/uL (ref 0.7–4.0)
MCH: 26.1 pg (ref 26.0–34.0)
MCHC: 30.3 g/dL (ref 30.0–36.0)
MCV: 86.3 fL (ref 78.0–100.0)
MONO ABS: 0.5 10*3/uL (ref 0.1–1.0)
Monocytes Relative: 5 %
NEUTROS ABS: 7.8 10*3/uL — AB (ref 1.7–7.7)
NEUTROS PCT: 77 %
Platelets: 491 10*3/uL — ABNORMAL HIGH (ref 150–400)
RBC: 3.06 MIL/uL — AB (ref 3.87–5.11)
RDW: 15.1 % (ref 11.5–15.5)
WBC: 10.1 10*3/uL (ref 4.0–10.5)

## 2017-03-23 LAB — RENAL FUNCTION PANEL
ALBUMIN: 2.2 g/dL — AB (ref 3.5–5.0)
ANION GAP: 7 (ref 5–15)
BUN: 8 mg/dL (ref 6–20)
CALCIUM: 8.2 mg/dL — AB (ref 8.9–10.3)
CO2: 30 mmol/L (ref 22–32)
Chloride: 101 mmol/L (ref 101–111)
Creatinine, Ser: 0.68 mg/dL (ref 0.44–1.00)
GLUCOSE: 123 mg/dL — AB (ref 65–99)
PHOSPHORUS: 2.4 mg/dL — AB (ref 2.5–4.6)
POTASSIUM: 2.8 mmol/L — AB (ref 3.5–5.1)
SODIUM: 138 mmol/L (ref 135–145)

## 2017-03-23 LAB — MAGNESIUM: Magnesium: 2 mg/dL (ref 1.7–2.4)

## 2017-03-23 MED ORDER — POTASSIUM CHLORIDE 10 MEQ/50ML IV SOLN
10.0000 meq | INTRAVENOUS | Status: AC
  Administered 2017-03-23 (×4): 10 meq via INTRAVENOUS
  Filled 2017-03-23 (×4): qty 50

## 2017-03-23 MED ORDER — POTASSIUM CHLORIDE 20 MEQ/15ML (10%) PO SOLN
40.0000 meq | Freq: Once | ORAL | Status: AC
  Administered 2017-03-23: 40 meq
  Filled 2017-03-23: qty 30

## 2017-03-23 MED ORDER — SCOPOLAMINE 1 MG/3DAYS TD PT72
1.0000 | MEDICATED_PATCH | TRANSDERMAL | Status: DC
  Administered 2017-03-23: 1.5 mg via TRANSDERMAL
  Filled 2017-03-23 (×2): qty 1

## 2017-03-23 MED ORDER — FENTANYL CITRATE (PF) 100 MCG/2ML IJ SOLN
25.0000 ug | INTRAMUSCULAR | Status: DC | PRN
  Administered 2017-03-23: 100 ug via INTRAVENOUS
  Administered 2017-03-23 (×3): 50 ug via INTRAVENOUS
  Administered 2017-03-24: 25 ug via INTRAVENOUS
  Administered 2017-03-25: 50 ug via INTRAVENOUS
  Filled 2017-03-23 (×8): qty 2

## 2017-03-23 NOTE — Progress Notes (Signed)
Patient ID: Carly [name], female   DOB: [birth year], 46 y.o.   MRN: [id number] BP 129/69   Pulse 74   Temp 99.1 F (37.3 C) (Axillary)   Resp (!) 9   Ht 5\' 7"  (1.702 m)   Wt 107.2 kg (236 lb 5.3 oz)   LMP  (LMP Unknown) Comment: pt is unresponsive, and is unable to communicate  SpO2 100%   BMI 37.02 kg/m  Lethargic today, opens eyes to voice ventric draining well Was bucking ventilator earlier Continue current measures.

## 2017-03-23 NOTE — Progress Notes (Signed)
Daily Progress Note   Patient Name: Carly [name]       Date: 03/23/2017 DOB: [birth year]  Age: 46 y.o. MRN#: [id number] Attending Physician: Consuella Lose, MD Primary Care Physician: [name], MD Admit Date: 03/03/2017  Reason for Consultation/Follow-up: Establishing goals of care, Psychosocial/spiritual support and Withdrawal of life-sustaining treatment  Subjective: Met with patient's husband, [name], and updated clinical status specifically new fevers with subsequent blood culture workup pending; hypokalemia. Husband shares with me that his wife told him in reference to a family member of hers that had a catastrophic event and subsequently became bedbound and total care that "she she wouldn't want to live that way".  Length of Stay: 20  Current Medications: Scheduled Meds:  .  stroke: mapping our early stages of recovery book   Does not apply Once  . chlorhexidine gluconate (MEDLINE KIT)  15 mL Mouth Rinse BID  . Chlorhexidine Gluconate Cloth  6 each Topical Q0600  . feeding supplement (PRO-STAT SUGAR FREE 64)  30 mL Per Tube BID  . free water  200 mL Per Tube Q8H  . insulin aspart  0-9 Units Subcutaneous Q4H  . mouth rinse  15 mL Mouth Rinse Q2H  . multivitamin  15 mL Per Tube Daily  . pantoprazole sodium  40 mg Per Tube Daily  . scopolamine  1 patch Transdermal Q72H  . sodium chloride flush  10-40 mL Intracatheter Q12H    Continuous Infusions: . sodium chloride 50 mL/hr at 03/23/17 1329  .  ceFAZolin (ANCEF) IV Stopped (03/23/17 1015)  . feeding supplement (VITAL HIGH PROTEIN) 1,000 mL (03/22/17 1900)  . fentaNYL infusion INTRAVENOUS Stopped (03/22/17 0757)  . levETIRAcetam Stopped (03/23/17 1256)  . norepinephrine (LEVOPHED) Adult infusion 9 mcg/min (03/23/17 1254)    . potassium chloride 10 mEq (03/23/17 1330)    PRN Meds: acetaminophen **OR** acetaminophen (TYLENOL) oral liquid 160 mg/5 mL **OR** acetaminophen, docusate, fentaNYL, fentaNYL (SUBLIMAZE) injection, labetalol, loperamide, midazolam, sodium chloride flush  Physical Exam  Constitutional: She appears well-developed and well-nourished.  Cardiovascular: Normal rate and regular rhythm.   Still on Levophed  Pulmonary/Chest:  On ventilator; increased oral secretions, scopolamine patch applied  Genitourinary:  Genitourinary Comments: Foley and rectal tube  Neurological:  Opens eyes to voice intermittently No initiation of voluntary movements observed  Skin: Skin is warm  and dry.  Nursing note and vitals reviewed.           Vital Signs: BP 136/69   Pulse 71   Temp 99.1 F (37.3 C) (Axillary)   Resp (!) 9   Ht '5\' 7"'  (1.702 m)   Wt 107.2 kg (236 lb 5.3 oz)   LMP  (LMP Unknown) Comment: pt is unresponsive, and is unable to communicate  SpO2 100%   BMI 37.02 kg/m  SpO2: SpO2: 100 % O2 Device: O2 Device: Ventilator O2 Flow Rate:    Intake/output summary:  Intake/Output Summary (Last 24 hours) at 03/23/17 1428 Last data filed at 03/23/17 1406  Gross per 24 hour  Intake          2654.53 ml  Output             1972 ml  Net           682.53 ml   LBM: Last BM Date: 03/23/17 (flexiseal) Baseline Weight: Weight: 108 kg (238 lb 1.6 oz) Most recent weight: Weight: 107.2 kg (236 lb 5.3 oz)       Palliative Assessment/Data:    Flowsheet Rows     Most Recent Value  Intake Tab  Referral Department  Hospitalist  Unit at Time of Referral  ICU  Palliative Care Primary Diagnosis  Neurology  Date Notified  03/21/17  Palliative Care Type  New Palliative care  Reason for referral  Clarify Goals of Care  Date of Admission  03/03/17  Date first seen by Palliative Care  03/22/17  # of days Palliative referral response time  1 Day(s)  # of days IP prior to Palliative referral  18   Clinical Assessment  Palliative Performance Scale Score  20%  Pain Max last 24 hours  Not able to report  Pain Min Last 24 hours  Not able to report  Dyspnea Max Last 24 Hours  Not able to report  Dyspnea Min Last 24 hours  Not able to report  Nausea Max Last 24 Hours  Not able to report  Nausea Min Last 24 Hours  Not able to report  Anxiety Max Last 24 Hours  Not able to report  Anxiety Min Last 24 Hours  Not able to report  Other Max Last 24 Hours  Not able to report  Psychosocial & Spiritual Assessment  Palliative Care Outcomes  Patient/Family meeting held?  Yes  Who was at the meeting?  husband  Palliative Care Outcomes  Clarified goals of care  Patient/Family wishes: Interventions discontinued/not started   [place] follow-up planned  Yes, Facility      Patient Active Problem List   Diagnosis Date Noted  . Palliative care by specialist   . Electrolyte imbalance 03/19/2017  . History of ETT   . Encounter for intubation   . SAH (subarachnoid hemorrhage) ([place])   . Acute respiratory failure ([place])   . Ruptured cerebral aneurysm ([place]) 03/03/2017  . Routine general medical examination at a health care facility 08/23/2013  . Essential hypertension, benign 08/23/2013  . Impaired fasting glucose 08/23/2013  . Plantar fasciitis, bilateral 08/23/2013  . Unspecified vitamin D deficiency 08/23/2013  . Obesity, unspecified 08/23/2013  . Unspecified essential hypertension 12/31/2012  . GERD (gastroesophageal reflux disease) 12/31/2012  . COMMON MIGRAINE 06/16/2008  . CHEST PAIN, INTERMITTENT 06/16/2008    Palliative Care Assessment & Plan   Patient Profile: 46 y.o. female  with past medical history of Hypertension, GERD, migraine headaches admitted on 03/03/2017  with headache and focal seizure activity. CT scan of the head showed subarachnoid hemorrhage with associated IVH in the setting of presumed ruptured aneurysm. Patient developed progressive decrease in level of  consciousness, vomited, postured. She was intubated for airway protection and IVC placed per neurosurgery. 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 Positive for active extravasation of hemorrhage. Patient underwent embolization on 03/07/2017. Chest CT performed on 03/07/2017 concerning for multifocal pneumonia. She has remained critically ill in ICU but has been more alert over the past couple of days. She is blinking her eyes intermittently to command, seemingly responding to people's voices. Per nursing, she is weaning but is still on the ventilator at this point. She has a new onset of fevers, on a cooling blanket, blood cultures drawn Assessment: Met with patient's husband , 2 children, 2 brothers, mother as well as other extended family members to answer questions regarding trach, ventilator dependency with a trach and what that could mean in terms of disposition options, quality of life. Many family members thought that by putting a trach in you would no longer need a ventilator so that was one question and concern that we were able to resolve. We also discussed that being ventilator dependent with dramatically limit her options of where she would live. Potentially she could find herself out of the local area, even out of state, in a long-term care facility or .I also informed them that she could not remain at [facility] once she's medically stabilized until a local bed is obtained. Family members are very divided on not pursuing a trach. I did remind them of the conversation that I had with her husband this morning about her statements regarding her brother, and how she perceived him as not having quality of life after a catastrophic motorcycle accident, and her statements to the effect that she would not want to live that way either. Another family member questioned whether  she is brain dead or if there was testing to ascertain whether she is brain dead. Probably having something definitive such as hearing that your loved one is brain dead would make this decision easier for this family but at this point clinical brain death is not present and is not related to her current lack of quality of life, poor prognosis Recommendations/Plan:  Husband is in a difficult position in that family is divided that he has been clear that his wife's wishes would be not to pursue a trach or find herself ventilator dependent, dependent for all her daily needs and living in a facility  I reminded the family that unfortunately we do not need to have a decision about the trach the first of the week unless contraindicated  Palliative medicine to stay involved and help family with these very difficult decisions. I asked if another meeting with critical care medicine presents would be helpful and family members at the table felt like that it would not be helpful at this point; that they understand her clinical status  Goals of Care and Additional Recommendations:  Limitations on Scope of Treatment: No Tracheostomy  Code Status:    Code Status Orders        Start     Ordered   03/09/17 1343  Do not attempt resuscitation (DNR)  Continuous     03/09/17 1343    Code Status History    Date Active Date Inactive  Code Status Order ID Comments User Context   03/03/2017  2:31 PM 03/09/2017  1:43 PM Full Code [id number]  Traci Sermon, PA-C ED       Prognosis:   Unable to determine  Discharge Planning:  To Be Determined  Care plan was discussed with Dr. Ashok Cordia  Thank you for allowing the Palliative Medicine Team to assist in the care of this patient.   Time In: 1000 1430 Time Out: 1040 1600 Total Time 130 min Prolonged Time Billed  yes       Greater than 50%  of this time was spent counseling and coordinating care related to the above assessment and plan.  Dory Horn, NP  Please contact Palliative Medicine Team phone at [phone] for questions and concerns.

## 2017-03-23 NOTE — Progress Notes (Signed)
[facility] Attending Note  ADMISSION DATE:03/03/2017  CONSULTATION DATE:03/03/2017  REFERRING MD:Dr. Ralene Bathe EDP  CHIEF COMPLAINT:Headache  Presenting HPI:  45 y.o. female with PMH which is significant for HTN, GERD, and migraines admitted 6/18 for HA &focal seizure activity. CT head SAH w/ associated IVH in setting of presumed ruptured aneurysm. Developed progressive decreased LOC, vomited, postured. Intubated for airway protection and IVC placed per neuro-surg.   Subjective:  No acute events overnight. Tolerating tube feedings. Nurses have noted patient is mounting a fever. Continues on vasopressor support. Excessive oral secretions with minimal tracheal secretions. Intermittent coughing relieved with fentanyl.  Review of Systems:  Unable to obtain given intubation & altered mentation.   Vent Mode: PSV;CPAP FiO2 (%):  [30 %] 30 % Set Rate:  [16 bmp] 16 bmp Vt Set:  [400 mL] 400 mL PEEP:  [5 cmH20] 5 cmH20 Pressure Support:  [10 MPN36-14 cmH20] 10 cmH20 Plateau Pressure:  [14 cmH20-16 cmH20] 14 cmH20   Temp:  [98.4 F (36.9 C)-101 F (38.3 C)] 99.1 F (37.3 C) (07/08 0800) Pulse Rate:  [58-106] 94 (07/08 0900) Resp:  [5-31] 22 (07/08 0900) BP: (87-166)/(44-81) 123/67 (07/08 0900) SpO2:  [95 %-100 %] 99 % (07/08 0900) FiO2 (%):  [30 %] 30 % (07/08 0753) Weight:  [107.2 kg (236 lb 5.3 oz)] 107.2 kg (236 lb 5.3 oz) (07/08 0421)  General:  No distress. No family at bedside. Obese. Integument:  Warm & dry. No rash on exposed skin.  Extremities:  No cyanosis or clubbing.  HEENT:  Moist mucus membranes. No scleral injection or icterus. Endotracheal tube remains in place. Cardiovascular:  Regular rate. No edema. No appreciable JVD.  Pulmonary:  Coarse breath sounds bilaterally. Minimal tracheal secretions. Symmetric chest wall rise on ventilator. Abdomen: Soft. Normal bowel sounds. Protuberant. Neurological: No spontaneous movements. No withdrawal to  pain in extremities. Patient intermittently opens eyes but does not open eyes or blink on command. Does not attend to voice.  LINES/TUBES: OETT 6/18 - 6/19 (self-extubated); 6/19 >>> IVD 6/18 >>> L FEM ART LINE 6/22 >>> RUE DL PICC 6/21 >>> NGT >>> Foley >>>  CBC Latest Ref Rng & Units 03/23/2017 03/19/2017 03/18/2017  WBC 4.0 - 10.5 K/uL 10.1 11.1(H) 12.3(H)  Hemoglobin 12.0 - 15.0 g/dL 8.0(L) 8.6(L) 8.6(L)  Hematocrit 36.0 - 46.0 % 26.4(L) 28.3(L) 28.3(L)  Platelets 150 - 400 K/uL 491(H) 278 241     BMP Latest Ref Rng & Units 03/23/2017 03/19/2017 03/18/2017  Glucose 65 - 99 mg/dL 123(H) 205(H) 174(H)  BUN 6 - 20 mg/dL _0 Creatinine 0.44 - 1.00 mg/dL 0.68 0.70 0.73  BUN/Creat Ratio 9 - 23 - - -  Sodium 135 - 145 mmol/L 138 141 143  Potassium 3.5 - 5.1 mmol/L 2.8(L) 3.8 3.6  Chloride 101 - 111 mmol/L 101 99(L) 100(L)  CO2 22 - 32 mmol/L 30 35(H) 35(H)  Calcium 8.9 - 10.3 mg/dL 8.2(L) 8.3(L) 8.3(L)    Hepatic Function Latest Ref Rng & Units 03/23/2017 03/09/2017 03/08/2017  Total Protein 6.5 - 8.1 g/dL - 5.3(L) 5.8(L)  Albumin 3.5 - 5.0 g/dL 2.2(L) 2.1(L) 2.4(L)  AST 15 - 41 U/L - 102(H) 58(H)  ALT 14 - 54 U/L - 57(H) 10(L)  Alk Phosphatase 38 - 126 U/L - 86 85  Total Bilirubin 0.3 - 1.2 mg/dL - 1.1 1.2  Bilirubin, Direct 0.0 - 0.3 mg/dL - - -    IMAGING/STUDIES: CTA HEAD/NECK 6/18: IMPRESSION: 1. Ruptured intracranial aneurysm with  hemorrhage into the left inferior frontal gyrus and then into the ventricular system via of the left lateral ventricle. 2. 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 3. Critical Value/emergent results were called by telephone at the time of interpretation on 03/03/2017 at 1242 hours to RN Laveda Norman in the ED who verbally acknowledged and relayed these to Dr. Quintella Reichert , who was involved in patient intubation. I also then briefly discussed the case by telephone with Dr.  Juliet Rude. 4. 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 5. Mild ventriculomegaly/transependymal edema. Absence subarachnoid hemorrhage at this time, and basilar cisterns remain patent. 6. Tortuous carotid arteries. No atherosclerosis or stenosis. No other intracranial aneurysm identified. CTA HEAD/NECK 6/22: IMPRESSION: 1. Satisfactory post coil embolization appearance of the left supraclinoid ICA aneurysm. 2. 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 Left MCA and bilateral PCA Vasa spasm appears unchanged. 3. Questionable new high-grade stenosis at the left vertebral artery origin. The left vertebral artery otherwise appears stable and within normal limits throughout its course. It functionally terminates in PICA. 4. Otherwise stable arterial findings in the neck. 5. Stable CT appearance of the brain since 03/05/2017. Continued large volume intraventricular hemorrhage. Stable EVD and ventricle size. Stable left inferior frontal gyrus hematoma. 6. See also Chest CT today reported separately. CT CHEST 6/22:  Multifocal airspace opacities, most confluent throughout the right lung and in the lower lobes concerning for multifocal pneumonia. Small right pleural effusion. PORT CXR 7/8:  Personally reviewed by me. Persistent, mild bilateral lower lung predominant patchy opacities. Endotracheal tube in good position. Right upper extremity PICC in good position. Enteric feeding tube coursing below diaphragm.  MICROBIOLOGY: MRSA PCR 6/18:  Negative  HIV 6/18:  Nonreactive  Blood Cultures x2 6/22:  Negative  Tracheal Aspirate Culture 6/21:  MSSA Tracheal Aspirate Culture 6/22:  MSSA BAL 6/22:  MSSA   ANTIBIOTICS: Vancomycin 6/22 - 6/23 Zyvox 6/23 - 6/25 Zosyn 6/22 - 6/25 Ancef 6/25 >>>  SIGNIFICANT EVENTS: 06/18 -  Admit after presenting w/ SAH >> IVD placed & left ICA aneurysm coiled 06/19 - Self extubated but not following commands or protecting airway & on 100% NRB>>reintubated  06/22 - Bronchoscopy by PM w/ purulent secretions bilaterally  06/27 - off NMB 06/29 - Worsening hypoxia 07/04 - Tube feedings held due to N/V & Keppra IV started  07/05 - Trickle tube feedings restarted  07/08 - Developing a new fever   ASSESSMENT/PLAN:  46 y.o. female with high-grade subarachnoid hemorrhage and subsequent encephalopathy. Now developing a fever likely indicating a new infectious source.   1. Subarachnoid hemorrhage: Status post coiling. Management per neurosurgery. Currently continuing Keppra. 2. Acute encephalopathy: Secondary to neurologic injury. Minimizing sedation. Utilizing bolus fentanyl for relief of any discomfort or coughing. 3. Shock: Sepsis versus cardiogenic source. Goal systolic blood pressure as per neurosurgery. Monitoring vitals per unit protocol. Weaning vasopressor support. 4. FUO: Checking blood, urine, and tracheal aspirate cultures. Also checking urinalysis. Holding on adjusting current antibiotic regimen. 5. MSSA pneumonia: Currently on day #16 of Ancef. Further antibiotic selection pending results of cultures/gram stains. 6. Acute hypoxic and hypercarbic respiratory failure: Multifactorial from pulmonary edema as well as MSSA pneumonia. Continuing daily pressure support wean. Planning for one way extubation early this week. 7. Acute renal failure: Resolved. Monitoring urine output. Holding on  further diuresis. 8. Hypokalemia: Replacing with KCl via tube and IV. Repeat basic metabolic panel in a.m. 9. Anemia: Likely multifactorial. No evidence of ongoing blood loss. Hemoglobin stable. Monitoring cell counts and ultimately with CBC. 10. Hyperglycemia: No history of diabetes. Continuing Accu-Cheks and sliding scale insulin. 11. Essential hypertension: Currently hypotensive. Holding  antihypertensive regimen.  12. Excessive oral secretions: Ordering scopolamine patch.  Prophylaxis:  SCDs & Protonix via tube daily.  Diet:  NPO. Continuing trickle tube feedings. Code Status:  DNR per previous physician discussion.  Disposition:  As per primary service. Palliative medicine following. Planning for one-way extubation Monday or Tuesday. Family Update:  Sister updated at bedside during my rounds.   I have spent a total of 33 minutes of critical care time today caring for the patient and reviewing the patient's electronic medical record.   Remainder of care as per primary service and other consultants.  Sonia Baller Ashok Cordia, M.D. [facility] Pager:  [phone] After 3pm or if no response, call [phone] 9:24 AM 03/23/17

## 2017-03-24 DIAGNOSIS — J988 Other specified respiratory disorders: Secondary | ICD-10-CM

## 2017-03-24 LAB — URINE CULTURE
CULTURE: NO GROWTH
Special Requests: NORMAL

## 2017-03-24 LAB — GLUCOSE, CAPILLARY
GLUCOSE-CAPILLARY: 131 mg/dL — AB (ref 65–99)
GLUCOSE-CAPILLARY: 120 mg/dL — AB (ref 65–99)
GLUCOSE-CAPILLARY: 128 mg/dL — AB (ref 65–99)
Glucose-Capillary: 136 mg/dL — ABNORMAL HIGH (ref 65–99)
Glucose-Capillary: 127 mg/dL — ABNORMAL HIGH (ref 65–99)

## 2017-03-24 LAB — BASIC METABOLIC PANEL
ANION GAP: 8 (ref 5–15)
BUN: 9 mg/dL (ref 6–20)
CO2: 29 mmol/L (ref 22–32)
Calcium: 8.3 mg/dL — ABNORMAL LOW (ref 8.9–10.3)
Chloride: 101 mmol/L (ref 101–111)
Creatinine, Ser: 0.68 mg/dL (ref 0.44–1.00)
GFR calc non Af Amer: >60 mL/min (ref >60)
GLUCOSE: 154 mg/dL — AB (ref 65–99)
POTASSIUM: 3.2 mmol/L — AB (ref 3.5–5.1)
Sodium: 138 mmol/L (ref 135–145)

## 2017-03-24 MED ORDER — POTASSIUM CHLORIDE 20 MEQ/15ML (10%) PO SOLN
40.0000 meq | Freq: Once | ORAL | Status: AC
Start: 2017-03-24 — End: 2017-03-24
  Administered 2017-03-24: 40 meq
  Filled 2017-03-24: qty 30

## 2017-03-24 MED ORDER — SODIUM CHLORIDE 0.45 % IV SOLN
INTRAVENOUS | Status: DC | PRN
  Administered 2017-03-24 – 2017-03-30 (×2): via INTRAVENOUS

## 2017-03-24 NOTE — Progress Notes (Signed)
No issues overnight.   EXAM:  BP 112/62   Pulse (!) 107   Temp 98.8 F (37.1 C)   Resp (!) 22   Ht 5\' 7"  (1.702 m)   Wt 107.4 kg (236 lb 12.4 oz)   LMP  (LMP Unknown) Comment: pt is unresponsive, and is unable to communicate  SpO2 99%   BMI 37.08 kg/m   Eye opening to voice Minimal w/d RUE  IMPRESSION:  46 y.o. female s/p SAH,  Given lack of any significant improvement in neurologic condition, I think the chance of meaningful recovery is minimal.  PLAN: - Family has opted for terminal wean tomorrow, appreciate palliative care assistance.

## 2017-03-24 NOTE — Progress Notes (Signed)
Spoke to patient's husband this AM before he left to go home. He stated that we are to tell the MD that he wants the ET Tube pulled and patient to be full comfort care starting tomorrow (02/23/17) at 1030. Will inform MD during rounds. Verlon Au, RN, BSN 7:49 AM 03/24/2017

## 2017-03-24 NOTE — Progress Notes (Addendum)
PCCM Progress Note  Admission date: 03/03/2017 Referring provider: Dr. Ralene Bathe, ER  CC: Headache  HPI: 46 yo female with HA and seizure.  Found to have [place] with IVH.  Intubated for airway protection.  Subjective: Unresponsive.  Vital signs: BP (!) 162/70   Pulse 69   Temp 99.2 F (37.3 C) (Axillary)   Resp (!) 30   Ht 5\' 7"  (1.702 m)   Wt 236 lb 12.4 oz (107.4 kg)   LMP  (LMP Unknown) Comment: pt is unresponsive, and is unable to communicate  SpO2 99%   BMI 37.08 kg/m   Intake/output: I/O last 3 completed shifts: In: 5325.1 [I.V.:2419.8; Other:1000; VF/IE:3329.5; IV Piggyback:720] Out: 3117 [Urine:2750; Drains:242; Stool:125]  General: not following commands Neuro: squints eyes HEENT: ETT in place Cardiac: regular Chest: b/l rhonchi Abd: soft, non tender Ext: 1+ edema Skin: no rashes   CMP Latest Ref Rng & Units 03/24/2017 03/23/2017 03/19/2017  Glucose 65 - 99 mg/dL 154(H) 123(H) 205(H)  BUN 6 - 20 mg/dL 9 8 12   Creatinine 0.44 - 1.00 mg/dL 0.68 0.68 0.70  Sodium 135 - 145 mmol/L 138 138 141  Potassium 3.5 - 5.1 mmol/L 3.2(L) 2.8(L) 3.8  Chloride 101 - 111 mmol/L 101 101 99(L)  CO2 22 - 32 mmol/L 29 30 35(H)  Calcium 8.9 - 10.3 mg/dL 8.3(L) 8.2(L) 8.3(L)  Total Protein 6.5 - 8.1 g/dL - - -  Total Bilirubin 0.3 - 1.2 mg/dL - - -  Alkaline Phos 38 - 126 U/L - - -  AST 15 - 41 U/L - - -  ALT 14 - 54 U/L - - -     CBC Latest Ref Rng & Units 03/23/2017 03/19/2017 03/18/2017  WBC 4.0 - 10.5 K/uL 10.1 11.1(H) 12.3(H)  Hemoglobin 12.0 - 15.0 g/dL 8.0(L) 8.6(L) 8.6(L)  Hematocrit 36.0 - 46.0 % 26.4(L) 28.3(L) 28.3(L)  Platelets 150 - 400 K/uL 491(H) 278 241     ABG    Component Value Date/Time   PHART 7.461 (H) 03/16/2017 0328   PCO2ART 59.0 (H) 03/16/2017 0328   PO2ART 75.1 (L) 03/16/2017 0328   HCO3 41.5 (H) 03/16/2017 0328   TCO2 36 03/11/2017 1509   ACIDBASEDEF 10.0 (H) 03/10/2017 0339   O2SAT 95.0 03/16/2017 0328     CBG (last 3)   Recent Labs   03/23/17 2338 03/24/17 0345 03/24/17 0702  GLUCAP 121* 131* 120*     Imaging: Dg Chest Port 1 View  Result Date: 03/23/2017 CLINICAL DATA:  Patient with respiratory failure. EXAM: PORTABLE CHEST 1 VIEW COMPARISON:  Chest radiograph 03/19/2017 FINDINGS: ET tube terminates in the mid trachea. Enteric tube courses inferior to the diaphragm. Right upper extremity PICC line tip projects over the superior cavoatrial junction. Monitoring leads overlie the patient. Patient is rotated to the left. Stable cardiac and mediastinal contours. Low lung volumes. Unchanged perihilar and lower lung heterogeneous pulmonary opacities. IMPRESSION: ET tube mid trachea. Persistent perihilar and lower lung airspace opacities which may represent edema. Electronically Signed   By: Lovey Newcomer M.D.   On: 03/23/2017 07:23     Studies: CTA 6/18 >> ruptured intracranial aneurysm with hemorrhage Lt inferior frontal gyrus and into ventricular system, Lt ICA aneurysm, vasospasm b/l ACA CT Chest 6/22 >>Multifocal airspace opacities, most confluent throughout the right lung and in the lower lobes concerning for multifocal pneumonia.  Antibiotics: Vancomycin 6/22 >6/23 zyvox 6/23 > 6/25 Zosyn 6/22 > 6/25 Ancef 6/25 >  Cultures: Sputum 6/21 >MSSA Sputum 6/22 >MSSA Blood 6/22 >  negative  Lines/tubes: ETT 6/18 >> PICC 6/21 >>  Summary: 46 yo female with SAH, VDRF.  Course complicated by MSSA HCAP.  Family has opted against tracheostomy.  Assessment/plan:  Acute respiratory failure with compromised airway in setting of SAH. - family has opted for one way extubation on 7/10 - scopolamine patch for respiratory secretion control  SAH s/p coiling of Lt ICA aneurysm. IVH s/p IVD. Seizure. - AEDs per neurosurgery  MSSA HCAP. - continue Abx for now  Hypokalemia. - defer further lab testing given plan for comfort measures 7/10  Hyperglycemia. - D/c SSI given plan for comfort measures 7/10  DVT prophylaxis  - SCDs SUP - Protonix Nutrition - tube feeds Goals of care - DNR.  Family has opted against tracheostomy and long term vent support.  Plan is for one way extubation and transition to comfort measures 7/10   CC time 32 minutes  Chesley Mires, MD [place] 03/24/2017, 10:05 AM Pager:  [phone] After 3pm call: [phone]

## 2017-03-24 NOTE — Progress Notes (Signed)
eLink Physician-Brief Progress Note Patient Name: Carly [name] DOB: Jan 28, 1971 MRN: [id number]   Date of Service  03/24/2017  HPI/Events of Note    eICU Interventions  Hypokalemia -repleted      Intervention Category Intermediate Interventions: Electrolyte abnormality - evaluation and management  Marrion Accomando V. 03/24/2017, 4:54 AM

## 2017-03-25 ENCOUNTER — Encounter (HOSPITAL_COMMUNITY): Payer: Self-pay | Admitting: Neurosurgery

## 2017-03-25 DIAGNOSIS — Z7189 Other specified counseling: Secondary | ICD-10-CM

## 2017-03-25 LAB — CULTURE, RESPIRATORY W GRAM STAIN

## 2017-03-25 LAB — CULTURE, RESPIRATORY: SPECIAL REQUESTS: NORMAL

## 2017-03-25 MED ORDER — MIDAZOLAM HCL 2 MG/2ML IJ SOLN: 1.0000 mg | INTRAMUSCULAR | Status: DC | PRN

## 2017-03-25 MED ORDER — FENTANYL CITRATE (PF) 100 MCG/2ML IJ SOLN
25.0000 ug | INTRAMUSCULAR | Status: DC | PRN
  Administered 2017-03-25: 100 ug via INTRAVENOUS

## 2017-03-25 MED ORDER — GLYCOPYRROLATE 0.2 MG/ML IJ SOLN: 0.2000 mg | INTRAMUSCULAR | Status: DC | PRN

## 2017-03-25 NOTE — Progress Notes (Signed)
Nutrition Brief Note  Pt discussed during ICU rounds and with RN.  Chart reviewed.  Pt for one-way extubation, now transitioning to comfort care.  No further nutrition interventions warranted at this time.  Please re-consult as needed.   [place], [place], [place] Pager [phone] After Hours Pager

## 2017-03-25 NOTE — Progress Notes (Signed)
Daily Progress Note   Patient Name: Carly [name]       Date: 03/25/2017 DOB: Mar 01, 1971  Age: 46 y.o. MRN#: [id number] Attending Physician: Consuella Lose, MD Primary Care Physician: [name], MD Admit Date: 03/03/2017  Reason for Consultation/Follow-up: Establishing goals of care and Psychosocial/spiritual support  Subjective: Family has gathered at bedside for extubation. Carly [name] is awake/alert and following commands.   Length of Stay: 22  Current Medications: Scheduled Meds:  .  stroke: mapping our early stages of recovery book   Does not apply Once  . chlorhexidine gluconate (MEDLINE KIT)  15 mL Mouth Rinse BID  . Chlorhexidine Gluconate Cloth  6 each Topical Q0600  . mouth rinse  15 mL Mouth Rinse Q2H  . pantoprazole sodium  40 mg Per Tube Daily  . scopolamine  1 patch Transdermal Q72H  . sodium chloride flush  10-40 mL Intracatheter Q12H    Continuous Infusions: . sodium chloride 10 mL/hr at 03/25/17 0900  . fentaNYL infusion INTRAVENOUS Stopped (03/22/17 0757)  . levETIRAcetam Stopped (03/25/17 0032)    PRN Meds: sodium chloride, [DISCONTINUED] acetaminophen **OR** acetaminophen (TYLENOL) oral liquid 160 mg/5 mL **OR** acetaminophen, fentaNYL, fentaNYL (SUBLIMAZE) injection, glycopyrrolate, midazolam, sodium chloride flush  Physical Exam  Constitutional: She appears well-developed. She is intubated.  HENT:  EVD dressed and clean  Cardiovascular: Normal rate and regular rhythm.   Pulmonary/Chest: Effort normal and breath sounds normal. No accessory muscle usage. No tachypnea. She is intubated. No respiratory distress.  Abdominal: Soft. Normal appearance.  Neurological: She is alert.  Responding by nodding head appropriately  Nursing note and vitals  reviewed.           Vital Signs: BP 104/60   Pulse 91   Temp 99.3 F (37.4 C) (Axillary)   Resp (!) 29   Ht '5\' 7"'  (1.702 m)   Wt 107.2 kg (236 lb 5.3 oz)   LMP  (LMP Unknown) Comment: pt is unresponsive, and is unable to communicate  SpO2 97%   BMI 37.02 kg/m  SpO2: SpO2: 97 % O2 Device: O2 Device: Ventilator O2 Flow Rate:    Intake/output summary:  Intake/Output Summary (Last 24 hours) at 03/25/17 1113 Last data filed at 03/25/17 0900  Gross per 24 hour  Intake  550 ml  Output             2002 ml  Net            -1452 ml   LBM: Last BM Date: 03/24/17 Baseline Weight: Weight: 108 kg (238 lb 1.6 oz) Most recent weight: Weight: 107.2 kg (236 lb 5.3 oz)       Palliative Assessment/Data:    Flowsheet Rows     Most Recent Value  Intake Tab  Referral Department  Hospitalist  Unit at Time of Referral  ICU  Palliative Care Primary Diagnosis  Neurology  Date Notified  03/21/17  Palliative Care Type  New Palliative care  Reason for referral  Clarify Goals of Care  Date of Admission  03/03/17  Date first seen by Palliative Care  03/22/17  # of days Palliative referral response time  1 Day(s)  # of days IP prior to Palliative referral  18  Clinical Assessment  Palliative Performance Scale Score  20%  Pain Max last 24 hours  Not able to report  Pain Min Last 24 hours  Not able to report  Dyspnea Max Last 24 Hours  Not able to report  Dyspnea Min Last 24 hours  Not able to report  Nausea Max Last 24 Hours  Not able to report  Nausea Min Last 24 Hours  Not able to report  Anxiety Max Last 24 Hours  Not able to report  Anxiety Min Last 24 Hours  Not able to report  Other Max Last 24 Hours  Not able to report  Psychosocial & Spiritual Assessment  Palliative Care Outcomes  Patient/Family meeting held?  Yes  Who was at the meeting?  husband  Palliative Care Outcomes  Clarified goals of care  Patient/Family wishes: Interventions discontinued/not started   [place] follow-up planned  Yes, Facility      Patient Active Problem List   Diagnosis Date Noted  . Ventilator dependent ([place])   . Palliative care by specialist   . Electrolyte imbalance 03/19/2017  . History of ETT   . Encounter for intubation   . Subarachnoid hemorrhage ([place])   . Acute respiratory failure ([place])   . Ruptured cerebral aneurysm ([place]) 03/03/2017  . Routine general medical examination at a health care facility 08/23/2013  . Essential hypertension, benign 08/23/2013  . Impaired fasting glucose 08/23/2013  . Plantar fasciitis, bilateral 08/23/2013  . Unspecified vitamin D deficiency 08/23/2013  . Obesity, unspecified 08/23/2013  . Unspecified essential hypertension 12/31/2012  . GERD (gastroesophageal reflux disease) 12/31/2012  . COMMON MIGRAINE 06/16/2008  . CHEST PAIN, INTERMITTENT 06/16/2008    Palliative Care Assessment & Plan   HPI: 46 y.o. female  with past medical history of Hypertension, GERD, migraine headaches admitted on 03/03/2017 with headache and focal seizure activity. CT scan of the head showed subarachnoid hemorrhage with associated IVH in the setting of presumed ruptured aneurysm. Patient developed progressive decrease in level of consciousness, vomited, postured. She was intubated for airway protection and IVC placed per neurosurgery. 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 Positive for active extravasation of hemorrhage. Patient underwent embolization on 03/07/2017. Chest CT performed on 03/07/2017 concerning for multifocal pneumonia. She has remained critically ill in ICU but has been more alert over the past couple of days. She is blinking her eyes intermittently to command, seemingly responding to people's voices. Per  nursing, she is weaning but is still on the ventilator at this point. She is also on a cooling  blanket and she has been febrile today, remains on Levophed.   Assessment: Many family members at bedside. However, patient's husband and surrogate decision maker is not at the bedside. I spent much time trying to reach him to confirm goals to extubate and our goals towards comfort otherwise. Neighbors went to wake him up to call me. He does confirm no desire for tracheostomy and for one way extubation with clear understanding that she will not be reintubated. However, he tells me that comfort is our goal first and foremost BUT he would like all other measure to "give [place] a chance." NOT FULL COMFORT CARE. He would transition to full comfort care with continued decline which he understands may be likely. Family at bedside updated on our direction and goals. Updated RN at bedside. Extubated successfully and maintaining herself well.   Recommendations/Plan:  No reintubation  Full comfort with further decline will need to be further discussed (he is even ok with vasopressors if needed)  All family remain hopeful for continued improvement  Goals of Care and Additional Recommendations:  Limitations on Scope of Treatment: DNR  Code Status:  DNR  Prognosis:   Unable to determine - doing well so far off vent. Will monitor and the next 24-48 hours she will likely declare herself. Family more hopeful given her alertness, breathing well, and appropriate responses.   Discharge Planning:  To Be Determined  Thank you for allowing the Palliative Medicine Team to assist in the care of this patient.   Total Time 103mn Prolonged Time Billed  no       Greater than 50%  of this time was spent counseling and coordinating care related to the above assessment and plan.  AVinie Sill NP Palliative Medicine Team Pager # [phone](M-F 8a-5p) Team Phone # [phone](Nights/Weekends)

## 2017-03-25 NOTE — Progress Notes (Signed)
Pt extubated and transition to comfort care.  PCCM will sign off.  Chesley Mires, MD [facility] Pulmonary/Critical Care 03/25/2017, 2:57 PM Pager:  [phone] After 3pm call: [phone]

## 2017-03-25 NOTE — Progress Notes (Signed)
Responded to consult for pt with poor prognosis to be extubated, who, as [facility]), could use spiritual support. Pt has been extubated and is so far holding her own. Offered spiritual/emotional support to several family members in rm, who thanked me for coming. Their own pastor was w/ them then, so deferred prayer to him. Advised family they can ask nurse to page for a chaplain at any time.    03/25/17 1200  Clinical Encounter Type  Visited With Patient and family together;Health care provider  Visit Type Follow-up;Psychological support;Spiritual support;Social support;Critical Care  Referral From Nurse  Spiritual Encounters  Spiritual Needs Emotional;Grief support  Stress Factors  Patient Stress Factors Health changes;Loss of control (extubation prognosis poor?)  Family Stress Factors Family relationships;Health changes;Loss of control   Gerrit Heck, Chaplain

## 2017-03-25 NOTE — Progress Notes (Signed)
No issues overnight. Extubated this am, has remained stable.  EXAM:  BP 108/70   Pulse 73   Temp 99.2 F (37.3 C) (Axillary)   Resp (!) 24   Ht 5\' 7"  (1.702 m)   Wt 107.2 kg (236 lb 5.3 oz)   LMP  (LMP Unknown) Comment: pt is unresponsive, and is unable to communicate  SpO2 99%   BMI 37.02 kg/m   Opens eyes to voice Has followed commands by opening mouth, extending tongue Minimal movements of both hands No significant movement of BLE EVD in place  IMPRESSION:  46 y.o. female s/p SAH, extubated today and has remained stable from cardiorespiratory standpoint.   PLAN: - Will raise EVD tomorrow

## 2017-03-25 NOTE — Procedures (Signed)
Extubation Procedure Note  Patient Details:   Name: Carly [name] DOB: 11/20/1970 MRN: [id number]   Airway Documentation:     Evaluation  O2 sats: stable throughout Complications: No apparent complications Patient did tolerate procedure well. Bilateral Breath Sounds: Clear, Diminished   No  Johnette Abraham 03/25/2017, 11:22 AM

## 2017-03-25 NOTE — Progress Notes (Signed)
PCCM Progress Note  Admission date: 03/03/2017 Referring provider: Dr. Ralene Bathe, ER  CC: Headache  HPI: 46 yo female with HA and seizure.  Found to have [place] with IVH.  Intubated for airway protection.  Subjective: Tolerates pressure support.  Vital signs: BP (!) 113/57   Pulse 76   Temp 99 F (37.2 C) (Axillary)   Resp 16   Ht 5\' 7"  (1.702 m)   Wt 236 lb 5.3 oz (107.2 kg)   LMP  (LMP Unknown) Comment: pt is unresponsive, and is unable to communicate  SpO2 100%   BMI 37.02 kg/m   Intake/output: I/O last 3 completed shifts: In: 1628 [I.V.:1148; NG/GT:480] Out: 1 [Urine:3800; Drains:185; Stool:125]  General - unresponsive Eyes - squints eyes with stimulation ENT - ETT in place Cardiac - regular, no murmur Chest - no wheeze, rales Abd - soft, non tender Ext - no edema Skin - no rashes Neuro - not following commands   CMP Latest Ref Rng & Units 03/24/2017 03/23/2017 03/19/2017  Glucose 65 - 99 mg/dL 154(H) 123(H) 205(H)  BUN 6 - 20 mg/dL 9 8 12   Creatinine 0.44 - 1.00 mg/dL 0.68 0.68 0.70  Sodium 135 - 145 mmol/L 138 138 141  Potassium 3.5 - 5.1 mmol/L 3.2(L) 2.8(L) 3.8  Chloride 101 - 111 mmol/L 101 101 99(L)  CO2 22 - 32 mmol/L 29 30 35(H)  Calcium 8.9 - 10.3 mg/dL 8.3(L) 8.2(L) 8.3(L)  Total Protein 6.5 - 8.1 g/dL - - -  Total Bilirubin 0.3 - 1.2 mg/dL - - -  Alkaline Phos 38 - 126 U/L - - -  AST 15 - 41 U/L - - -  ALT 14 - 54 U/L - - -     CBC Latest Ref Rng & Units 03/23/2017 03/19/2017 03/18/2017  WBC 4.0 - 10.5 K/uL 10.1 11.1(H) 12.3(H)  Hemoglobin 12.0 - 15.0 g/dL 8.0(L) 8.6(L) 8.6(L)  Hematocrit 36.0 - 46.0 % 26.4(L) 28.3(L) 28.3(L)  Platelets 150 - 400 K/uL 491(H) 278 241     ABG    Component Value Date/Time   PHART 7.461 (H) 03/16/2017 0328   PCO2ART 59.0 (H) 03/16/2017 0328   PO2ART 75.1 (L) 03/16/2017 0328   HCO3 41.5 (H) 03/16/2017 0328   TCO2 36 03/11/2017 1509   ACIDBASEDEF 10.0 (H) 03/10/2017 0339   O2SAT 95.0 03/16/2017 0328     CBG (last  3)   Recent Labs  03/24/17 0702 03/24/17 1241 03/24/17 1521  GLUCAP 120* 128* 127*     Imaging: No results found.   Studies: CTA 6/18 >> ruptured intracranial aneurysm with hemorrhage Lt inferior frontal gyrus and into ventricular system, Lt ICA aneurysm, vasospasm b/l ACA CT Chest 6/22 >>Multifocal airspace opacities, most confluent throughout the right lung and in the lower lobes concerning for multifocal pneumonia.  Antibiotics: Vancomycin 6/22 >6/23 zyvox 6/23 > 6/25 Zosyn 6/22 > 6/25 Ancef 6/25 >  Cultures: Sputum 6/21 >MSSA Sputum 6/22 >MSSA Blood 6/22 > negative  Lines/tubes: ETT 6/18 >> PICC 6/21 >>  Summary: 46 yo female with SAH, VDRF.  Course complicated by MSSA HCAP.  Family has opted against tracheostomy.  Assessment:  Acute respiratory failure with compromised airway in setting of SAH. 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 IVH s/p IVD. Seizure. MSSA HCAP. Hypokalemia. Hyperglycemia.  Plan:  DNR Plan for one way extubation and comfort care when family arrives at 10:30 AM today  Chesley Mires, MD [place] 03/25/2017, 8:23 AM Pager:  [phone] After 3pm call: [phone]

## 2017-03-26 LAB — GLUCOSE, CAPILLARY
GLUCOSE-CAPILLARY: 103 mg/dL — AB (ref 65–99)
GLUCOSE-CAPILLARY: 78 mg/dL (ref 65–99)
Glucose-Capillary: 91 mg/dL (ref 65–99)

## 2017-03-26 MED ORDER — LABETALOL HCL 5 MG/ML IV SOLN: 10.0000 mg | INTRAVENOUS | Status: DC | PRN

## 2017-03-26 MED ORDER — PRO-STAT SUGAR FREE PO LIQD
30.0000 mL | Freq: Two times a day (BID) | ORAL | Status: DC
  Administered 2017-03-26 – 2017-03-31 (×11): 30 mL
  Filled 2017-03-26 (×11): qty 30

## 2017-03-26 MED ORDER — LORAZEPAM 2 MG/ML IJ SOLN: 1.0000 mg | INTRAMUSCULAR | Status: DC | PRN

## 2017-03-26 MED ORDER — STROKE: EARLY STAGES OF RECOVERY BOOK
Freq: Once | Status: DC
  Filled 2017-03-26: qty 1

## 2017-03-26 MED ORDER — LOPERAMIDE HCL 1 MG/5ML PO LIQD
2.0000 mg | ORAL | Status: DC | PRN
  Administered 2017-04-02: 2 mg
  Filled 2017-03-26 (×2): qty 10

## 2017-03-26 MED ORDER — ACETAMINOPHEN 160 MG/5ML PO SOLN
650.0000 mg | ORAL | Status: DC | PRN
  Administered 2017-03-28 – 2017-04-06 (×10): 650 mg
  Filled 2017-03-26 (×11): qty 20.3

## 2017-03-26 MED ORDER — ADULT MULTIVITAMIN LIQUID CH
15.0000 mL | Freq: Every day | ORAL | Status: DC
  Administered 2017-03-26 – 2017-04-09 (×15): 15 mL
  Filled 2017-03-26 (×17): qty 15

## 2017-03-26 MED ORDER — PANTOPRAZOLE SODIUM 40 MG PO PACK
40.0000 mg | PACK | Freq: Every day | ORAL | Status: DC
  Administered 2017-03-26 – 2017-04-09 (×15): 40 mg
  Filled 2017-03-26 (×16): qty 20

## 2017-03-26 MED ORDER — DEXTROSE 5 % IV SOLN
2.0000 g | INTRAVENOUS | Status: AC
  Administered 2017-03-26 – 2017-03-30 (×5): 2 g via INTRAVENOUS
  Filled 2017-03-26 (×5): qty 2

## 2017-03-26 MED ORDER — FENTANYL CITRATE (PF) 100 MCG/2ML IJ SOLN: 50.0000 ug | INTRAMUSCULAR | Status: DC | PRN

## 2017-03-26 MED ORDER — VITAL HIGH PROTEIN PO LIQD
1000.0000 mL | ORAL | Status: DC
  Administered 2017-03-26 – 2017-03-31 (×6): 1000 mL
  Filled 2017-03-26 (×2): qty 1000

## 2017-03-26 NOTE — Evaluation (Signed)
Physical Therapy Evaluation Patient Details Name: Carly [name] MRN: [id number] DOB: 10-01-1970 Today's Date: 03/26/2017   History of Present Illness  HPI/Patient Profile: 46 y.o. female  with past medical history of Hypertension, GERD, migraine headaches admitted on 03/03/2017 with headache and focal seizure activity. CT scan of the head showed subarachnoid hemorrhage with associated IVH in the setting of presumed ruptured aneurysm. Patient developed progressive decrease in level of consciousness, vomited, postured. She was intubated for airway protection and IVC placed per neurosurgery. 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 Positive for active extravasation of hemorrhage. Patient underwent embolization on 03/07/2017.   Clinical Impression  Pt admitted with above. Pt with generalized weakness t/o extremities, delayed processing, non-verbal and requires maxAx2 for all mobility. Pt did open eyes to name and follow simple commands 75% of time. Pt was shaking head yes/no 50% accurately to questions asked. Pt was indep PTA and has undergone a prolonged hospital course. Pt to benefit from aggressive rehab protocol once medically stable.    Follow Up Recommendations CIR    Equipment Recommendations   (TBD)    Recommendations for Other Services Rehab consult     Precautions / Restrictions Precautions Precautions: Fall Precaution Comments: ventric Restrictions Weight Bearing Restrictions: No      Mobility  Bed Mobility Overal bed mobility: Needs Assistance Bed Mobility: Supine to Sit;Sit to Supine;Rolling Rolling: Total assist;+2 for physical assistance;+2 for safety/equipment   Supine to sit: Total assist;+2 for physical assistance;+2 for safety/equipment Sit to supine: Total assist;+2 for physical assistance;+2 for safety/equipment   General bed  mobility comments: pt unable to facilitate any part of transfer. pt dependent for LE and trunk management  Transfers                 General transfer comment: unable  Ambulation/Gait             General Gait Details: unable  Stairs            Wheelchair Mobility    Modified Rankin (Stroke Patients Only) Modified Rankin (Stroke Patients Only) Pre-Morbid Rankin Score: No symptoms Modified Rankin: Severe disability     Balance Overall balance assessment: Needs assistance Sitting-balance support: Feet unsupported;Bilateral upper extremity supported Sitting balance-Leahy Scale: Zero Sitting balance - Comments: total assist to maintain sitting EOB, pt tolerated sitting x 10 min                                     Pertinent Vitals/Pain Pain Assessment: Faces Faces Pain Scale: No hurt (pt shook head no when asked)    Home Living Family/patient expects to be discharged to:: Private residence Living Arrangements: Spouse/significant other               Additional Comments: no family present, pt unable to communicate PLOF    Prior Function Level of Independence: Independent         Comments: per chart pt indep     Hand Dominance        Extremity/Trunk Assessment   Upper Extremity Assessment Upper Extremity Assessment: RUE deficits/detail;LUE deficits/detail RUE Deficits / Details: grossly 1/5 RUE Sensation: decreased light touch LUE Deficits / Details: groslly 1/5, grip 2-/5 LUE Sensation: decreased light touch    Lower Extremity Assessment Lower Extremity Assessment: LLE deficits/detail;RLE deficits/detail RLE Deficits /  Details: attempted to wiggle toes, otherwise 0/5 RLE Sensation: decreased light touch LLE Deficits / Details: grossly 0/5 LLE Sensation: decreased light touch    Cervical / Trunk Assessment Cervical / Trunk Assessment: Normal  Communication   Communication: Receptive difficulties;Expressive  difficulties (non verbal, was shaking head to yes/no)  Cognition Arousal/Alertness: Awake/alert Behavior During Therapy: Flat affect Overall Cognitive Status: Impaired/Different from baseline Area of Impairment: Following commands;Attention;Problem solving                   Current Attention Level: Focused   Following Commands: Follows one step commands with increased time;Follows one step commands inconsistently     Problem Solving: Slow processing;Decreased initiation;Difficulty sequencing;Requires verbal cues;Requires tactile cues General Comments: pt non verbal and unable to answer questions, pt shook head yes/no to simple questions but not consistently      General Comments General comments (skin integrity, edema, etc.): edema x 4 extremities. pt incontienent of stool/leakage of flexiseal. Pt dependent for hygiene and rolling L/R    Exercises     Assessment/Plan    PT Assessment Patient needs continued PT services  PT Problem List Decreased strength;Decreased range of motion;Decreased activity tolerance;Decreased balance;Decreased mobility;Decreased coordination;Decreased cognition;Decreased knowledge of use of DME;Decreased safety awareness;Impaired sensation       PT Treatment Interventions DME instruction;Gait training;Stair training;Functional mobility training;Therapeutic activities;Therapeutic exercise;Balance training;Neuromuscular re-education;Cognitive remediation;Patient/family education    PT Goals (Current goals can be found in the Care Plan section)  Acute Rehab PT Goals Patient Stated Goal: didn't state PT Goal Formulation: Patient unable to participate in goal setting Time For Goal Achievement: 04/09/17 Potential to Achieve Goals: Fair    Frequency Min 3X/week   Barriers to discharge        Co-evaluation               AM-PAC PT "6 Clicks" Daily Activity  Outcome Measure Difficulty turning over in bed (including adjusting bedclothes,  sheets and blankets)?: Total Difficulty moving from lying on back to sitting on the side of the bed? : Total Difficulty sitting down on and standing up from a chair with arms (e.g., wheelchair, bedside commode, etc,.)?: Total Help needed moving to and from a bed to chair (including a wheelchair)?: Total Help needed walking in hospital room?: Total Help needed climbing 3-5 steps with a railing? : Total 6 Click Score: 6    End of Session Equipment Utilized During Treatment: Oxygen Activity Tolerance: Patient tolerated treatment well Patient left: in bed;with call bell/phone within reach;with bed alarm set Nurse Communication:  (RN present for eval) PT Visit Diagnosis: Muscle weakness (generalized) (M62.81);Difficulty in walking, not elsewhere classified (R26.2)    Time: 8676-1950 PT Time Calculation (min) (ACUTE ONLY): 28 min   Charges:   PT Evaluation $PT Eval High Complexity: 1 Procedure PT Treatments $Therapeutic Activity: 8-22 mins   PT G Codes:        Kittie Plater, PT, DPT Pager #: [phone] Office #: [phone]   Leronda Lewers M Rojelio Uhrich 03/26/2017, 4:23 PM

## 2017-03-26 NOTE — Progress Notes (Signed)
EVD raised to 46mmHg with MD at bedside. Jahron Hunsinger, Rande Brunt, RN

## 2017-03-26 NOTE — Progress Notes (Addendum)
PCCM Progress Note  Admission date: 03/03/2017 Referring provider: Dr. Ralene Bathe, ER  CC: Headache  HPI: 46 yo woman admitted 6/18 for HA & focal seizure activity. CT head SAH w/ associated IVH in setting of presumed ruptured aneurysm. Course complicated by MSSA pneumonia/ARDS & vasospasm  Studies: CTA 6/18 >> ruptured intracranial aneurysm with hemorrhage Lt inferior frontal gyrus and into ventricular system, Lt ICA aneurysm, vasospasm b/l ACA CT Chest 6/22 >>Multifocal airspace opacities, most confluent throughout the right lung and in the lower lobes concerning for multifocal pneumonia.  Antibiotics: Vancomycin 6/22 >6/23 zyvox 6/23 > 6/25 Zosyn 6/22 > 6/25 Ancef 6/25 >7/10 ceftx 7/10 >>  Cultures: Sputum 6/21 >MSSA Sputum 6/22 >MSSA Blood 6/22 > negative resp 7/8 > e coli S to cefazolin/ ceftx  Lines/tubes: ETT 6/18 >>7/10 PICC 6/21 >>  Subjective: Extubated 7/10 & seems to have done well Afebrile No obvious pain   Vital signs: BP 120/71   Pulse 84   Temp 99.1 F (37.3 C) (Axillary)   Resp (!) 22   Ht 5\' 7"  (1.702 m)   Wt 236 lb 5.3 oz (107.2 kg)   LMP  (LMP Unknown) Comment: pt is unresponsive, and is unable to communicate  SpO2 96%   BMI 37.02 kg/m   Intake/output: I/O last 3 completed shifts: In: 540 [I.V.:390; NG/GT:150] Out: 2561 [Urine:2325; Drains:136; Stool:100]  General - acutely ill, obese HEENT - no pallor, icterus Cardiac - regular, no murmur Chest - no wheeze, rales Abd - soft, non tender Ext - no edema Skin - no rashes Neuro -  follows 1 step commands, very weak   CMP Latest Ref Rng & Units 03/24/2017 03/23/2017 03/19/2017  Glucose 65 - 99 mg/dL 154(H) 123(H) 205(H)  BUN 6 - 20 mg/dL 9 8 12   Creatinine 0.44 - 1.00 mg/dL 0.68 0.68 0.70  Sodium 135 - 145 mmol/L 138 138 141  Potassium 3.5 - 5.1 mmol/L 3.2(L) 2.8(L) 3.8  Chloride 101 - 111 mmol/L 101 101 99(L)  CO2 22 - 32 mmol/L 29 30 35(H)  Calcium 8.9 - 10.3 mg/dL 8.3(L) 8.2(L) 8.3(L)   Total Protein 6.5 - 8.1 g/dL - - -  Total Bilirubin 0.3 - 1.2 mg/dL - - -  Alkaline Phos 38 - 126 U/L - - -  AST 15 - 41 U/L - - -  ALT 14 - 54 U/L - - -     CBC Latest Ref Rng & Units 03/23/2017 03/19/2017 03/18/2017  WBC 4.0 - 10.5 K/uL 10.1 11.1(H) 12.3(H)  Hemoglobin 12.0 - 15.0 g/dL 8.0(L) 8.6(L) 8.6(L)  Hematocrit 36.0 - 46.0 % 26.4(L) 28.3(L) 28.3(L)  Platelets 150 - 400 K/uL 491(H) 278 241     ABG    Component Value Date/Time   PHART 7.461 (H) 03/16/2017 0328   PCO2ART 59.0 (H) 03/16/2017 0328   PO2ART 75.1 (L) 03/16/2017 0328   HCO3 41.5 (H) 03/16/2017 0328   TCO2 36 03/11/2017 1509   ACIDBASEDEF 10.0 (H) 03/10/2017 0339   O2SAT 95.0 03/16/2017 0328     CBG (last 3)   Recent Labs  03/24/17 0702 03/24/17 1241 03/24/17 1521  GLUCAP 120* 128* 127*     Imaging: No results found.     Summary: 46 yo female with SAH, VDRF.  Course complicated by MSSA HCAP.  Family  opted against tracheostomy & was extubated 7/10 ,seems to have rallied & is maintaining airway  Assessment:  Acute respiratory failure with compromised airway in setting of SAH.  -ct supportive care  [facility]  s/p coiling of Lt ICA aneurysm. IVH s/p IVD. - management per neuroSx  Seizure. -ativan prn  MSSA HCAP -completed abx E coli HCAP - ceftx x 5 ds  PT consult to mobilise, expect long recovery Will need swallow eval once better & would consider PEG if she continues to improve  DNR continues, but would understand if family decides to reverse at this point  Kara Mead MD. [place] Flood.  Pulmonary & Critical care Pager [phone] If no response call [phone]     03/26/2017, 9:17 AM

## 2017-03-26 NOTE — Progress Notes (Signed)
No issues overnight. Has remained stable after extubation yesterday.  EXAM:  BP (!) 104/55   Pulse 88   Temp 99.1 F (37.3 C) (Axillary)   Resp 10   Ht 5\' 7"  (1.702 m)   Wt 107.2 kg (236 lb 5.3 oz)   LMP  (LMP Unknown) Comment: pt is unresponsive, and is unable to communicate  SpO2 100%   BMI 37.02 kg/m   Eyes open spontaneously. Nods to questions  Follows commands but wiggling fingers, toes. Minimal elbow/shoulder strength, minimal/no proximal BLE strength EVD in place, bloody CSF  IMPRESSION:  46 y.o. female s/p SAH and coiling of LICA aneurysm. Has tolerated extubation well. Will likely need long-term care.  PLAN: - Raise EVD to 51mmHg today, cont to monitor neurologic exam.

## 2017-03-26 NOTE — Progress Notes (Signed)
Daily Progress Note   Patient Name: Carly [name]       Date: 03/26/2017 DOB: February 25, 1971  Age: 46 y.o. MRN#: [id number] Attending Physician: Consuella Lose, MD Primary Care Physician: Carly Resides, MD Admit Date: 03/03/2017  Reason for Consultation/Follow-up: Establishing goals of care and Psychosocial/spiritual support  Subjective: Carly [name] is intermittently alert and is responding to simple questions at times.   Length of Stay: 23  Current Medications: Scheduled Meds:  .  stroke: mapping our early stages of recovery book   Does not apply Once  . chlorhexidine gluconate (MEDLINE KIT)  15 mL Mouth Rinse BID  . Chlorhexidine Gluconate Cloth  6 each Topical Q0600  . feeding supplement (PRO-STAT SUGAR FREE 64)  30 mL Per Tube BID  . mouth rinse  15 mL Mouth Rinse Q2H  . multivitamin  15 mL Per Tube Daily  . pantoprazole sodium  40 mg Per Tube Daily  . sodium chloride flush  10-40 mL Intracatheter Q12H    Continuous Infusions: . sodium chloride 10 mL/hr at 03/26/17 1400  . cefTRIAXone (ROCEPHIN)  IV Stopped (03/26/17 1030)  . feeding supplement (VITAL HIGH PROTEIN) 1,000 mL (03/26/17 1400)  . levETIRAcetam Stopped (03/26/17 1222)    PRN Meds: sodium chloride, acetaminophen (TYLENOL) oral liquid 160 mg/5 mL, [DISCONTINUED] acetaminophen **OR** [DISCONTINUED] acetaminophen (TYLENOL) oral liquid 160 mg/5 mL **OR** acetaminophen, glycopyrrolate, labetalol, loperamide, LORazepam, sodium chloride flush  Physical Exam  Constitutional: She appears well-developed.  HENT:  EVD dressed and clean  Cardiovascular: Normal rate and regular rhythm.   Pulmonary/Chest: Effort normal and breath sounds normal. No accessory muscle usage. No tachypnea. No respiratory distress.  Abdominal: Soft.  Normal appearance.  Neurological: She is alert.  Responding by nodding head appropriately  Nursing note and vitals reviewed.           Vital Signs: BP (!) 131/56   Pulse 84   Temp 99.3 F (37.4 C) (Axillary)   Resp (!) 21   Ht _0  (1.702 m)   Wt 107.2 kg (236 lb 5.3 oz)   LMP  (LMP Unknown) Comment: pt is unresponsive, and is unable to communicate  SpO2 99%   BMI 37.02 kg/m  SpO2: SpO2: 99 % O2 Device: O2 Device: Nasal Cannula O2 Flow Rate: O2 Flow Rate (L/min): 2 L/min  Intake/output summary:  Intake/Output Summary (Last 24 hours) at 03/26/17 1442 Last data filed at 03/26/17 1400  Gross per 24 hour  Intake           790.17 ml  Output             1641 ml  Net          -850.83 ml   LBM: Last BM Date: 03/25/17 (flexiseal) Baseline Weight: Weight: 108 kg (238 lb 1.6 oz) Most recent weight: Weight: 107.2 kg (236 lb 5.3 oz)       Palliative Assessment/Data:    Flowsheet Rows     Most Recent Value  Intake Tab  Referral Department  Hospitalist  Unit at Time of Referral  ICU  Palliative Care Primary Diagnosis  Neurology  Date Notified  03/21/17  Palliative Care Type  New Palliative care  Reason for referral  Clarify Goals of Care  Date of Admission  03/03/17  Date first seen by Palliative Care  03/22/17  # of days Palliative referral response time  1 Day(s)  # of days IP prior to Palliative referral  18  Clinical Assessment  Palliative Performance Scale Score  20%  Pain Max last 24 hours  Not able to report  Pain Min Last 24 hours  Not able to report  Dyspnea Max Last 24 Hours  Not able to report  Dyspnea Min Last 24 hours  Not able to report  Nausea Max Last 24 Hours  Not able to report  Nausea Min Last 24 Hours  Not able to report  Anxiety Max Last 24 Hours  Not able to report  Anxiety Min Last 24 Hours  Not able to report  Other Max Last 24 Hours  Not able to report  Psychosocial & Spiritual Assessment  Palliative Care Outcomes  Patient/Family meeting  held?  Yes  Who was at the meeting?  husband  Palliative Care Outcomes  Clarified goals of care  Patient/Family wishes: Interventions discontinued/not started   [place] follow-up planned  Yes, Facility      Patient Active Problem List   Diagnosis Date Noted  . Ventilator dependent ([place])   . Palliative care by specialist   . Electrolyte imbalance 03/19/2017  . History of ETT   . Encounter for intubation   . Subarachnoid hemorrhage ([place])   . Acute respiratory failure ([place])   . Ruptured cerebral aneurysm ([place]) 03/03/2017  . Routine general medical examination at a health care facility 08/23/2013  . Essential hypertension, benign 08/23/2013  . Impaired fasting glucose 08/23/2013  . Plantar fasciitis, bilateral 08/23/2013  . Unspecified vitamin D deficiency 08/23/2013  . Obesity, unspecified 08/23/2013  . Unspecified essential hypertension 12/31/2012  . GERD (gastroesophageal reflux disease) 12/31/2012  . COMMON MIGRAINE 06/16/2008  . CHEST PAIN, INTERMITTENT 06/16/2008    Palliative Care Assessment & Plan   HPI: 46 y.o. female  with past medical history of Hypertension, GERD, migraine headaches admitted on 03/03/2017 with headache and focal seizure activity. CT scan of the head showed subarachnoid hemorrhage with associated IVH in the setting of presumed ruptured aneurysm. Patient developed progressive decrease in level of consciousness, vomited, postured. She was intubated for airway protection and IVC placed per neurosurgery. 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 Positive for active extravasation of hemorrhage. Patient underwent embolization on 03/07/2017. Chest CT performed on 03/07/2017 concerning for multifocal pneumonia. She has  remained critically ill in ICU but has been more alert over the past couple of days. She is blinking her  eyes intermittently to command, seemingly responding to people's voices. Now extubated - doing better than expected.   Assessment: Offered emotional support to family at bedside. Spoke with primary and PCCM and have restarted other medications and therapies based on my previous conversation with family desire to extubate and not pursue trach but to continue all other therapies to optimize her chances of recovery.   Recommendations/Plan:  No reintubation  Full comfort with further decline will need to be further discussed (he is even ok with vasopressors if needed)  All family remain hopeful for continued improvement  Goals of Care and Additional Recommendations:  Limitations on Scope of Treatment: DNR  Code Status:  DNR  Prognosis:   Unable to determine - doing well so far off vent. Will monitor and the next 24-48 hours she will likely declare herself. Family more hopeful given her alertness, breathing well, and appropriate responses.   Discharge Planning:  To Be Determined  Thank you for allowing the Palliative Medicine Team to assist in the care of this patient.   Total Time 35mn Prolonged Time Billed  no       Greater than 50%  of this time was spent counseling and coordinating care related to the above assessment and plan.  AVinie Sill NP Palliative Medicine Team Pager # [phone](M-F 8a-5p) Team Phone # [phone](Nights/Weekends)

## 2017-03-26 NOTE — Progress Notes (Signed)
Rehab Admissions Coordinator Note:  Patient was screened by Cleatrice Burke for appropriateness for an Inpatient Acute Rehab Consult per PT recommendation. Noted extubated yesterday and now with continued EVD. I would like to follow pt's progress over the next several days before requesting an inpt rehab consult as not to confuse the family to overall medical plan as of yet. I will follow.Cleatrice Burke 03/26/2017, 5:06 PM  I can be reached at [phone].

## 2017-03-27 DIAGNOSIS — Z7189 Other specified counseling: Secondary | ICD-10-CM

## 2017-03-27 DIAGNOSIS — I609 Nontraumatic subarachnoid hemorrhage, unspecified: Secondary | ICD-10-CM

## 2017-03-27 DIAGNOSIS — Z515 Encounter for palliative care: Secondary | ICD-10-CM

## 2017-03-27 LAB — BASIC METABOLIC PANEL
ANION GAP: 6 (ref 5–15)
BUN: 11 mg/dL (ref 6–20)
CALCIUM: 8.5 mg/dL — AB (ref 8.9–10.3)
CHLORIDE: 104 mmol/L (ref 101–111)
CO2: 28 mmol/L (ref 22–32)
Creatinine, Ser: 0.7 mg/dL (ref 0.44–1.00)
GFR calc Af Amer: >60 mL/min (ref >60)
GFR calc non Af Amer: >60 mL/min (ref >60)
GLUCOSE: 117 mg/dL — AB (ref 65–99)
Potassium: 3.6 mmol/L (ref 3.5–5.1)
Sodium: 138 mmol/L (ref 135–145)

## 2017-03-27 LAB — MAGNESIUM: Magnesium: 2 mg/dL (ref 1.7–2.4)

## 2017-03-27 LAB — GLUCOSE, CAPILLARY
GLUCOSE-CAPILLARY: 107 mg/dL — AB (ref 65–99)
GLUCOSE-CAPILLARY: 107 mg/dL — AB (ref 65–99)
GLUCOSE-CAPILLARY: 91 mg/dL (ref 65–99)
Glucose-Capillary: 98 mg/dL (ref 65–99)

## 2017-03-27 LAB — CBC
HEMATOCRIT: 28 % — AB (ref 36.0–46.0)
Hemoglobin: 8.6 g/dL — ABNORMAL LOW (ref 12.0–15.0)
MCH: 26.1 pg (ref 26.0–34.0)
MCHC: 30.7 g/dL (ref 30.0–36.0)
MCV: 84.8 fL (ref 78.0–100.0)
Platelets: 406 10*3/uL — ABNORMAL HIGH (ref 150–400)
RBC: 3.3 MIL/uL — ABNORMAL LOW (ref 3.87–5.11)
RDW: 15.3 % (ref 11.5–15.5)
WBC: 6.1 10*3/uL (ref 4.0–10.5)

## 2017-03-27 LAB — PHOSPHORUS: Phosphorus: 3 mg/dL (ref 2.5–4.6)

## 2017-03-27 NOTE — Progress Notes (Signed)
Daily Progress Note   Patient Name: Carly [name]       Date: 03/27/2017 DOB: 11/27/1970  Age: 46 y.o. MRN#: [id number] Attending Physician: Consuella Lose, MD Primary Care Physician: [name], MD Admit Date: 03/03/2017  Reason for Consultation/Follow-up: Establishing goals of care and Psychosocial/spiritual support  Subjective: Carly [name] continues to have slow and small improvements.   Length of Stay: 24  Current Medications: Scheduled Meds:  .  stroke: mapping our early stages of recovery book   Does not apply Once  . chlorhexidine gluconate (MEDLINE KIT)  15 mL Mouth Rinse BID  . Chlorhexidine Gluconate Cloth  6 each Topical Q0600  . feeding supplement (PRO-STAT SUGAR FREE 64)  30 mL Per Tube BID  . mouth rinse  15 mL Mouth Rinse Q2H  . multivitamin  15 mL Per Tube Daily  . pantoprazole sodium  40 mg Per Tube Daily  . sodium chloride flush  10-40 mL Intracatheter Q12H    Continuous Infusions: . sodium chloride 10 mL/hr at 03/27/17 0800  . cefTRIAXone (ROCEPHIN)  IV Stopped (03/27/17 1208)  . feeding supplement (VITAL HIGH PROTEIN) 1,000 mL (03/27/17 0800)  . levETIRAcetam Stopped (03/27/17 1153)    PRN Meds: sodium chloride, acetaminophen (TYLENOL) oral liquid 160 mg/5 mL, [DISCONTINUED] acetaminophen **OR** [DISCONTINUED] acetaminophen (TYLENOL) oral liquid 160 mg/5 mL **OR** acetaminophen, labetalol, loperamide, LORazepam, sodium chloride flush  Physical Exam  Constitutional: Carly [name] appears well-developed.  HENT:  EVD dressed and clean  Cardiovascular: Normal rate and regular rhythm.   Pulmonary/Chest: Effort normal and breath sounds normal. No accessory muscle usage. No tachypnea. No respiratory distress.  Abdominal: Soft. Normal appearance.  Neurological: Carly [name] is  alert.  Responding by nodding head appropriately  Nursing note and vitals reviewed.           Vital Signs: BP 117/68   Pulse 88   Temp 99.8 F (37.7 C) (Axillary)   Resp (!) 29   Ht '5\' 7"'  (1.702 m)   Wt 102.1 kg (225 lb 1.4 oz)   LMP  (LMP Unknown) Comment: pt is unresponsive, and is unable to communicate  SpO2 97%   BMI 35.25 kg/m  SpO2: SpO2: 97 % O2 Device: O2 Device: Nasal Cannula O2 Flow Rate: O2 Flow Rate (L/min): 2 L/min  Intake/output summary:   Intake/Output Summary (Last 24  hours) at 03/27/17 1356 Last data filed at 03/27/17 1138  Gross per 24 hour  Intake              610 ml  Output             2240 ml  Net            -1630 ml   LBM: Last BM Date: 03/26/17 Baseline Weight: Weight: 108 kg (238 lb 1.6 oz) Most recent weight: Weight: 102.1 kg (225 lb 1.4 oz)       Palliative Assessment/Data:    Flowsheet Rows     Most Recent Value  Intake Tab  Referral Department  Hospitalist  Unit at Time of Referral  ICU  Palliative Care Primary Diagnosis  Neurology  Date Notified  03/21/17  Palliative Care Type  New Palliative care  Reason for referral  Clarify Goals of Care  Date of Admission  03/03/17  Date first seen by Palliative Care  03/22/17  # of days Palliative referral response time  1 Day(s)  # of days IP prior to Palliative referral  18  Clinical Assessment  Palliative Performance Scale Score  20%  Pain Max last 24 hours  Not able to report  Pain Min Last 24 hours  Not able to report  Dyspnea Max Last 24 Hours  Not able to report  Dyspnea Min Last 24 hours  Not able to report  Nausea Max Last 24 Hours  Not able to report  Nausea Min Last 24 Hours  Not able to report  Anxiety Max Last 24 Hours  Not able to report  Anxiety Min Last 24 Hours  Not able to report  Other Max Last 24 Hours  Not able to report  Psychosocial & Spiritual Assessment  Palliative Care Outcomes  Patient/Family meeting held?  Yes  Who was at the meeting?  husband  Palliative  Care Outcomes  Clarified goals of care  Patient/Family wishes: Interventions discontinued/not started   [place] follow-up planned  Yes, Facility      Patient Active Problem List   Diagnosis Date Noted  . Ventilator dependent ([place])   . Palliative care by specialist   . Electrolyte imbalance 03/19/2017  . History of ETT   . Encounter for intubation   . Subarachnoid hemorrhage ([place])   . Acute respiratory failure ([place])   . 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 Routine general medical examination at a health care facility 08/23/2013  . Essential hypertension, benign 08/23/2013  . Impaired fasting glucose 08/23/2013  . Plantar fasciitis, bilateral 08/23/2013  . Unspecified vitamin D deficiency 08/23/2013  . Obesity, unspecified 08/23/2013  . Unspecified essential hypertension 12/31/2012  . GERD (gastroesophageal reflux disease) 12/31/2012  . COMMON MIGRAINE 06/16/2008  . CHEST PAIN, INTERMITTENT 06/16/2008    Palliative Care Assessment & Plan   HPI: 46 y.o. female  with past medical history of Hypertension, GERD, migraine headaches admitted on 03/03/2017 with headache and focal seizure activity. CT scan of the head showed subarachnoid hemorrhage with associated IVH in the setting of presumed ruptured aneurysm. Patient developed progressive decrease in level of consciousness, vomited, postured. Carly [name] was intubated for airway protection and IVC placed per neurosurgery. 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 Positive for active extravasation of hemorrhage. Patient underwent embolization on 03/07/2017. Chest CT performed on 03/07/2017 concerning for multifocal pneumonia. Carly [name] has  remained critically ill in ICU but has been more alert over the past couple of days. Carly [name] is blinking her eyes intermittently to command, seemingly responding to  people's voices. Doing well after extubation.   Assessment: No family at bedside. [place] continues to have slow improvement and continues to do well off vent. Family seems open to all other interventions other than trach to continue improvement. Please call palliative for any further assistance - otherwise we will shadow from a distance.   Recommendations/Plan:  No reintubation  Full comfort with further decline will need to be further discussed (he is even ok with vasopressors if needed)  All family remain hopeful for continued improvement  Goals of Care and Additional Recommendations:  Limitations on Scope of Treatment: DNR  Code Status:  DNR  Prognosis:   Unable to determine - doing well so far off vent. Will monitor and the next 24-48 hours Carly [name] will likely declare herself. Family more hopeful given her alertness, breathing well, and appropriate responses.   Discharge Planning:  To Be Determined  Thank you for allowing the Palliative Medicine Team to assist in the care of this patient.   Total Time 86mn Prolonged Time Billed  no       Greater than 50%  of this time was spent counseling and coordinating care related to the above assessment and plan.  AVinie Sill NP Palliative Medicine Team Pager # [phone](M-F 8a-5p) Team Phone # [phone](Nights/Weekends)

## 2017-03-27 NOTE — Evaluation (Signed)
Speech Language Pathology Evaluation Patient Details Name: Carly [name] MRN: [id number] DOB: Sep 23, 1970 Today's Date: 03/27/2017 Time: 9509-3267 SLP Time Calculation (min) (ACUTE ONLY): 8 min  Problem List:  Patient Active Problem List   Diagnosis Date Noted  . Ventilator dependent ([place])   . Palliative care by specialist   . Electrolyte imbalance 03/19/2017  . History of ETT   . Encounter for intubation   . Subarachnoid hemorrhage ([place])   . Acute respiratory failure ([place])   . 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 Routine general medical examination at a health care facility 08/23/2013  . Essential hypertension, benign 08/23/2013  . Impaired fasting glucose 08/23/2013  . Plantar fasciitis, bilateral 08/23/2013  . Unspecified vitamin D deficiency 08/23/2013  . Obesity, unspecified 08/23/2013  . Unspecified essential hypertension 12/31/2012  . GERD (gastroesophageal reflux disease) 12/31/2012  . COMMON MIGRAINE 06/16/2008  . CHEST PAIN, INTERMITTENT 06/16/2008   Past Medical History:  Past Medical History:  Diagnosis Date  . GERD (gastroesophageal reflux disease)   . Hypertension   . Migraines    Past Surgical History:  Past Surgical History:  Procedure Laterality Date  . CESAREAN SECTION    . IR ANGIO INTRA EXTRACRAN SEL INTERNAL CAROTID UNI R MOD SED  03/03/2017  . IR ANGIO VERTEBRAL SEL VERTEBRAL BILAT MOD SED  03/03/2017  . IR ANGIOGRAM FOLLOW UP STUDY  03/03/2017  . IR ANGIOGRAM FOLLOW UP STUDY  03/03/2017  . IR ANGIOGRAM FOLLOW UP STUDY  03/03/2017  . IR ANGIOGRAM FOLLOW UP STUDY  03/03/2017  . IR TRANSCATH/EMBOLIZ  03/03/2017  . RADIOLOGY WITH ANESTHESIA N/A 03/03/2017   Procedure: RADIOLOGY WITH ANESTHESIA;  Surgeon: Consuella Lose, MD;  Location: [facility];  Service: Radiology;  Laterality: N/A;   HPI:  46 y.o. female  with past medical history of Hypertension, GERD, migraine headaches admitted on 03/03/2017 with headache and focal seizure activity. CT scan  of the head showed subarachnoid hemorrhage with associated IVH in the setting of presumed ruptured aneurysm. Patient developed progressive decrease in level of consciousness, vomited, postured. She was intubated for airway protection and IVC placed per neurosurgery. 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 Positive for active extravasation of hemorrhage. Patient underwent embolization on 03/07/2017.    Assessment / Plan / Recommendation Clinical Impression  Pt does not make attempts to verbalize, although she does respond with head nods. She answered simple, biographical yes/no questions with 100% accuracy; however, when attempting mildly abstract questions, pt only answered one question and with an incorrect response. She appears to be limited by her attention span, as she will focus her attention to verbal or tactile stimuli, but her gaze is quickly diverted. She follows simple commands with delayed processing time and Min cues, but she does not seem to follow commands orally (stick out your tongue, open your mouth, etc.). Pt will need intensive SLP f/u to maximize functional recovery.     SLP Assessment  SLP Recommendation/Assessment: Patient needs continued Speech Lanaguage Pathology Services SLP Visit Diagnosis: Aphasia (R47.01);Cognitive communication deficit (R41.841)    Follow Up Recommendations  Inpatient Rehab    Frequency and Duration min 2x/week  2 weeks      SLP Evaluation Cognition  Overall Cognitive Status: Impaired/Different from baseline Arousal/Alertness: Awake/alert Orientation Level: Oriented to person Attention: Sustained Sustained Attention: Impaired Sustained Attention Impairment: Verbal basic;Functional basic Comments: slow processing  Comprehension  Auditory Comprehension Overall Auditory Comprehension: Impaired Yes/No  Questions: Impaired Basic Biographical Questions: 76-100% accurate Basic Immediate Environment Questions: 75-100% accurate Complex Questions: 0-24% accurate Commands: Impaired One Step Basic Commands: 50-74% accurate Interfering Components: Attention;Processing speed;Working memory EffectiveTechniques: Repetition;Other (Comment) (have to get pt's attention first)    Expression Expression Primary Mode of Expression: Nonverbal - gestures Verbal Expression Overall Verbal Expression: Impaired Initiation: Impaired Automatic Speech:  (none) Level of Generative/Spontaneous Verbalization:  (none) Non-Verbal Means of Communication: Gestures;Other (comment) (head nods)   Oral / Motor  Oral Motor/Sensory Function Overall Oral Motor/Sensory Function: Other (comment) (does not follow commands to assess) Motor Speech Overall Motor Speech: Other (comment) (UTA)   GO                    Germain Osgood 03/27/2017, 10:21 AM   Germain Osgood, M.A. CCC-SLP [phone]

## 2017-03-27 NOTE — Evaluation (Signed)
Clinical/Bedside Swallow Evaluation Patient Details  Name: Carly [name] MRN: [id number] Date of Birth: April 09, 1971  Today's Date: 03/27/2017 Time: SLP Start Time (ACUTE ONLY): 0919 SLP Stop Time (ACUTE ONLY): 0927 SLP Time Calculation (min) (ACUTE ONLY): 8 min  Past Medical History:  Past Medical History:  Diagnosis Date  . GERD (gastroesophageal reflux disease)   . Hypertension   . Migraines    Past Surgical History:  Past Surgical History:  Procedure Laterality Date  . CESAREAN SECTION    . IR ANGIO INTRA EXTRACRAN SEL INTERNAL CAROTID UNI R MOD SED  03/03/2017  . IR ANGIO VERTEBRAL SEL VERTEBRAL BILAT MOD SED  03/03/2017  . IR ANGIOGRAM FOLLOW UP STUDY  03/03/2017  . IR ANGIOGRAM FOLLOW UP STUDY  03/03/2017  . IR ANGIOGRAM FOLLOW UP STUDY  03/03/2017  . IR ANGIOGRAM FOLLOW UP STUDY  03/03/2017  . IR TRANSCATH/EMBOLIZ  03/03/2017  . RADIOLOGY WITH ANESTHESIA N/A 03/03/2017   Procedure: RADIOLOGY WITH ANESTHESIA;  Surgeon: Consuella Lose, MD;  Location: [facility];  Service: Radiology;  Laterality: N/A;   HPI:  46 y.o. female  with past medical history of Hypertension, GERD, migraine headaches admitted on 03/03/2017 with headache and focal seizure activity. CT scan of the head showed subarachnoid hemorrhage with associated IVH in the setting of presumed ruptured aneurysm. Patient developed progressive decrease in level of consciousness, vomited, postured. She was intubated for airway protection and IVC placed per neurosurgery. 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 Positive for active extravasation of hemorrhage. Patient underwent embolization on 03/07/2017.    Assessment / Plan / Recommendation Clinical Impression  Although pt does follow some simple commands, she does not perform oral motor tasks despite cueing. She will however open her mouth when a  stimulus is applied to it. Max faded to Mod cueing was provided for oral acceptance of POs. Her oral manipulation then appears on mildly prolonged, but multiple subswallows are elicited per bolus. She does not produce phonation for me so her vocal quality cannot be assessed; however, she likely has at least some level of post-extubation dysphagia given how many weeks she was intubated. Given her current mentation and several risk factors for aspiration, would recommend remaining NPO today. She will likely benefit from FEES prior to diet initiation, particularly if she does not start to vocalize for better subjective assessment of her vocal integrity. Will f/u for readiness. SLP Visit Diagnosis: Dysphagia, unspecified (R13.10)    Aspiration Risk  Severe aspiration risk;Moderate aspiration risk    Diet Recommendation NPO   Medication Administration: Via alternative means    Other  Recommendations Oral Care Recommendations: Oral care QID   Follow up Recommendations Inpatient Rehab      Frequency and Duration min 2x/week  2 weeks       Prognosis Prognosis for Safe Diet Advancement: Good Barriers to Reach Goals: Cognitive deficits;Language deficits      Swallow Study   General HPI: 46 y.o. female  with past medical history of Hypertension, GERD, migraine headaches admitted on 03/03/2017 with headache and focal seizure activity. CT scan of the head showed subarachnoid hemorrhage with associated IVH in the setting of presumed ruptured aneurysm. Patient developed progressive decrease in level of consciousness, vomited, postured. She was intubated for airway protection and IVC placed per neurosurgery. CTA on 6/18 revealed ruptured intracranial aneurysm with hemorrhage into the left inferior frontal gyrus and  then into the ventricular system via the left lateral ventricle. Positive for active extravasation of hemorrhage. Patient underwent embolization on 03/07/2017.  Type of Study: Bedside Swallow  Evaluation Previous Swallow Assessment: none in chart Diet Prior to this Study: NPO;NG Tube Temperature Spikes Noted: No Respiratory Status: Nasal cannula History of Recent Intubation: Yes Length of Intubations (days): 23 days Date extubated: 03/25/17 Behavior/Cognition: Alert;Requires cueing;Distractible Oral Cavity Assessment: Within Functional Limits Oral Care Completed by SLP: Yes Oral Cavity - Dentition: Adequate natural dentition Self-Feeding Abilities: Total assist Patient Positioning: Upright in bed Baseline Vocal Quality: Not observed    Oral/Motor/Sensory Function Overall Oral Motor/Sensory Function:  (does not follow commands to assess)   Ice Chips Ice chips: Impaired Presentation: Spoon Oral Phase Impairments: Poor awareness of bolus Pharyngeal Phase Impairments: Multiple swallows   Thin Liquid Thin Liquid: Impaired Presentation: Spoon Oral Phase Impairments: Poor awareness of bolus Pharyngeal  Phase Impairments: Multiple swallows    Nectar Thick Nectar Thick Liquid: Not tested   Honey Thick Honey Thick Liquid: Not tested   Puree Puree: Not tested   Solid   GO   Solid: Not tested        Carly [name] 03/27/2017,10:05 AM  Carly [name], M.A. CCC-SLP [phone]

## 2017-03-27 NOTE — Progress Notes (Signed)
PCCM Progress Note  Admission date: 03/03/2017 Referring provider: Dr. Ralene Bathe, ER  CC: Headache  HPI: 46 yo woman admitted 6/18 for HA & focal seizure activity. CT head SAH w/ associated IVH in setting of presumed ruptured aneurysm. Course complicated by MSSA pneumonia/ARDS & vasospasm  Studies: CTA 6/18 >> ruptured intracranial aneurysm with hemorrhage Lt inferior frontal gyrus and into ventricular system, Lt ICA aneurysm, vasospasm b/l ACA CT Chest 6/22 >>Multifocal airspace opacities, most confluent throughout the right lung and in the lower lobes concerning for multifocal pneumonia.  Antibiotics: Vancomycin 6/22 >6/23 zyvox 6/23 > 6/25 Zosyn 6/22 > 6/25 Ancef 6/25 >7/10 ceftx 7/10 >>  Cultures: Sputum 6/21 >MSSA Sputum 6/22 >MSSA Blood 6/22 > negative resp 7/8 > e coli S to cefazolin/ ceftx  Lines/tubes: ETT 6/18 >>7/10 PICC 6/21 >>  Subjective: Making some progress with PT, sat at edge of bed Afebrile No obvious pain   Vital signs: BP 122/64   Pulse 84   Temp 98.4 F (36.9 C) (Axillary)   Resp 18   Ht 5\' 7"  (1.702 m)   Wt 225 lb 1.4 oz (102.1 kg)   LMP  (LMP Unknown) Comment: pt is unresponsive, and is unable to communicate  SpO2 98%   BMI 35.25 kg/m   Intake/output: I/O last 3 completed shifts: In: 1130.2 [I.V.:370; NG/GT:600.2; IV Piggyback:160] Out: 2879 [Urine:2825; Drains:54]  General - acutely ill, obese,appears weak HEENT - no pallor, icterus Cardiac - regular, no murmur Chest - no wheeze, rales Abd - soft, non tender Ext - no edema Skin - no rashes Neuro -  Lethargic,follows 1 step commands,    CMP Latest Ref Rng & Units 03/27/2017 03/24/2017 03/23/2017  Glucose 65 - 99 mg/dL 117(H) 154(H) 123(H)  BUN 6 - 20 mg/dL 11 9 8   Creatinine 0.44 - 1.00 mg/dL 0.70 0.68 0.68  Sodium 135 - 145 mmol/L 138 138 138  Potassium 3.5 - 5.1 mmol/L 3.6 3.2(L) 2.8(L)  Chloride 101 - 111 mmol/L 104 101 101  CO2 22 - 32 mmol/L 28 29 30   Calcium 8.9 - 10.3  mg/dL 8.5(L) 8.3(L) 8.2(L)  Total Protein 6.5 - 8.1 g/dL - - -  Total Bilirubin 0.3 - 1.2 mg/dL - - -  Alkaline Phos 38 - 126 U/L - - -  AST 15 - 41 U/L - - -  ALT 14 - 54 U/L - - -     CBC Latest Ref Rng & Units 03/27/2017 03/23/2017 03/19/2017  WBC 4.0 - 10.5 K/uL 6.1 10.1 11.1(H)  Hemoglobin 12.0 - 15.0 g/dL 8.6(L) 8.0(L) 8.6(L)  Hematocrit 36.0 - 46.0 % 28.0(L) 26.4(L) 28.3(L)  Platelets 150 - 400 K/uL 406(H) 491(H) 278     ABG    Component Value Date/Time   PHART 7.461 (H) 03/16/2017 0328   PCO2ART 59.0 (H) 03/16/2017 0328   PO2ART 75.1 (L) 03/16/2017 0328   HCO3 41.5 (H) 03/16/2017 0328   TCO2 36 03/11/2017 1509   ACIDBASEDEF 10.0 (H) 03/10/2017 0339   O2SAT 95.0 03/16/2017 0328     CBG (last 3)   Recent Labs  03/27/17 0009 03/27/17 0313 03/27/17 0732  GLUCAP 98 107* 107*     Imaging: No results found.     Summary: 46 yo female with SAH, VDRF.  Course complicated by MSSA HCAP.  Family  opted against tracheostomy & was extubated 7/10 ,seems to have rallied & is making some progress  Assessment:  Acute respiratory failure with compromised airway in setting of [place]. -resolved  SAH s/p coiling of Lt ICA aneurysm. IVH s/p IVD. - management per neuroSx -can resume nimotop now that BP better  Seizure. -ativan prn  MSSA HCAP -completed abx E coli HCAP - ceftx x 5 ds  CIR consult in 1-2 days Will need swallow eval next week once she regains strength  DNR continues, but would understand if family decides to reverse at this point  PCCM available as needed, can transition to SDU   Kara Mead MD. Shade Flood. [facility] Pager [phone] If no response call [phone]     03/27/2017, 10:04 AM

## 2017-03-28 LAB — CULTURE, BLOOD (ROUTINE X 2)
CULTURE: NO GROWTH
CULTURE: NO GROWTH
SPECIAL REQUESTS: ADEQUATE
Special Requests: ADEQUATE

## 2017-03-28 LAB — GLUCOSE, CAPILLARY
GLUCOSE-CAPILLARY: 109 mg/dL — AB (ref 65–99)
GLUCOSE-CAPILLARY: 119 mg/dL — AB (ref 65–99)
GLUCOSE-CAPILLARY: 97 mg/dL (ref 65–99)
Glucose-Capillary: 104 mg/dL — ABNORMAL HIGH (ref 65–99)
Glucose-Capillary: 105 mg/dL — ABNORMAL HIGH (ref 65–99)
Glucose-Capillary: 106 mg/dL — ABNORMAL HIGH (ref 65–99)
Glucose-Capillary: 107 mg/dL — ABNORMAL HIGH (ref 65–99)

## 2017-03-28 MED ORDER — CHLORHEXIDINE GLUCONATE CLOTH 2 % EX PADS
6.0000 | MEDICATED_PAD | Freq: Every day | CUTANEOUS | Status: DC
  Administered 2017-03-28: 6 via TOPICAL

## 2017-03-28 MED ORDER — ORAL CARE MOUTH RINSE
15.0000 mL | OROMUCOSAL | Status: DC
  Administered 2017-03-28 – 2017-04-04 (×33): 15 mL via OROMUCOSAL

## 2017-03-28 MED ORDER — CHLORHEXIDINE GLUCONATE CLOTH 2 % EX PADS
6.0000 | MEDICATED_PAD | Freq: Every morning | CUTANEOUS | Status: DC
  Administered 2017-03-29 – 2017-04-01 (×4): 6 via TOPICAL

## 2017-03-28 NOTE — Progress Notes (Signed)
  Speech Language Pathology Treatment: Dysphagia;Cognitive-Linquistic  Patient Details Name: Carly [name] MRN: [id number] DOB: 1971-05-12 Today's Date: 03/28/2017 Time: 0370-4888 SLP Time Calculation (min) (ACUTE ONLY): 13 min  Assessment / Plan / Recommendation Clinical Impression  Pt seemed mildly more lethargic today, requiring more cues to maintain eye opening throughout session. She also kept her teeth clenched even with Max cues and oral stimulation during oral care. As a result, PO trials were not administered today. Treatment focused more heavily on cognitive-linguistic goals. Max cues were provided for sustained attention. She needed extra time and Min cues to attempt commend following with her extremities, still not following commands orally. No vocalizations were heard and pt did not repeat any words or sounds. Will continue to follow.   HPI HPI: 46 y.o. female  with past medical history of Hypertension, GERD, migraine headaches admitted on 03/03/2017 with headache and focal seizure activity. CT scan of the head showed subarachnoid hemorrhage with associated IVH in the setting of presumed ruptured aneurysm. Patient developed progressive decrease in level of consciousness, vomited, postured. She was intubated for airway protection and IVC placed per neurosurgery. 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 Positive for active extravasation of hemorrhage. Patient underwent embolization on 03/07/2017.       SLP Plan  Continue with current plan of care       Recommendations  Diet recommendations: NPO Medication Administration: Via alternative means                Oral Care Recommendations: Oral care QID Follow up Recommendations: Inpatient Rehab SLP Visit Diagnosis: Aphasia (R47.01);Cognitive communication deficit (B16.945) Plan: Continue with  current plan of care       GO                Germain Osgood 03/28/2017, 12:48 PM  Germain Osgood, M.A. CCC-SLP [phone]

## 2017-03-28 NOTE — Progress Notes (Signed)
No issues overnight.   EXAM:  BP 125/66   Pulse 84   Temp 98.6 F (37 C) (Oral)   Resp (!) 23   Ht 5\' 7"  (1.702 m)   Wt 102.6 kg (226 lb 3.1 oz)   LMP  (LMP Unknown) Comment: pt is unresponsive, and is unable to communicate  SpO2 100%   BMI 35.43 kg/m   Awake, alert,  Nods appropriately to questions Moves all extremities to command distally. Minimal proximal strength EVD in place, clamped.  IMPRESSION:  46 y.o. female sp SAH. EVD clamped x24hrs without neurologic change  PLAN: - Will cont to observe for another day. Plan on repeat CT tomorrow and likely d/c EVD

## 2017-03-28 NOTE — Progress Notes (Signed)
Physical Therapy Treatment Patient Details Name: Carly [name] MRN: [id number] DOB: 1971/07/27 Today's Date: 03/28/2017    History of Present Illness Pt is a 46 y/o female with PMH of HTN and migraines. She was admitted on 03/03/2017 with headache and focal seizure activity. CT scan of the head showed subarachnoid hemorrhage with associated IVH in the setting of presumed ruptured aneurysm. Patient developed progressive decrease in level of consciousness, vomited, postured. She was intubated for airway protection and IVC placed per neurosurgery. 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 Positive for active extravasation of hemorrhage. Patient underwent embolization on 03/07/2017.     PT Comments    Pt progressing slowly towards physical therapy goals. Once assisted into sitting, pt tolerated EOB activity for ~10 minutes with focus on core activation and stabilization. CIR remains the most appropriate and beneficial d/c disposition at this time. Will continue to follow and progress as able per POC.   Follow Up Recommendations  CIR     Equipment Recommendations  Other (comment) (TBD)    Recommendations for Other Services Rehab consult     Precautions / Restrictions Precautions Precautions: Fall Precaution Comments: ventric, NG tube Restrictions Weight Bearing Restrictions: No    Mobility  Bed Mobility Overal bed mobility: Needs Assistance Bed Mobility: Supine to Sit;Sit to Supine;Rolling Rolling: Max assist;+2 for physical assistance;+2 for safety/equipment   Supine to sit: Max assist;+2 for physical assistance;+2 for safety/equipment Sit to supine: Max assist;+2 for physical assistance;+2 for safety/equipment   General bed mobility comments: pt unable to facilitate any part of transfer. pt dependent for LE and trunk management  Transfers                  General transfer comment: unable  Ambulation/Gait             General Gait Details: unable   Stairs            Wheelchair Mobility    Modified Rankin (Stroke Patients Only) Modified Rankin (Stroke Patients Only) Pre-Morbid Rankin Score: No symptoms Modified Rankin: Severe disability     Balance Overall balance assessment: Needs assistance Sitting-balance support: Feet unsupported;Bilateral upper extremity supported Sitting balance-Leahy Scale: Zero Sitting balance - Comments: total assist to maintain sitting EOB, pt tolerated sitting x 10 min                                    Cognition Arousal/Alertness: Awake/alert Behavior During Therapy: Flat affect Overall Cognitive Status: Impaired/Different from baseline Area of Impairment: Following commands;Attention;Problem solving                   Current Attention Level: Focused   Following Commands: Follows one step commands with increased time;Follows one step commands inconsistently     Problem Solving: Slow processing;Decreased initiation;Difficulty sequencing;Requires verbal cues;Requires tactile cues General Comments: pt non verbal and unable to answer questions, pt shook head yes/no to simple questions but not consistently      Exercises      General Comments        Pertinent Vitals/Pain Pain Assessment: Faces Faces Pain Scale: No hurt    Home Living                      Prior Function  PT Goals (current goals can now be found in the care plan section) Acute Rehab PT Goals Patient Stated Goal: didn't state PT Goal Formulation: Patient unable to participate in goal setting Time For Goal Achievement: 04/09/17 Potential to Achieve Goals: Fair Progress towards PT goals: Progressing toward goals    Frequency    Min 3X/week      PT Plan Current plan remains appropriate    Co-evaluation              AM-PAC PT "6 Clicks"  Daily Activity  Outcome Measure  Difficulty turning over in bed (including adjusting bedclothes, sheets and blankets)?: Total Difficulty moving from lying on back to sitting on the side of the bed? : Total Difficulty sitting down on and standing up from a chair with arms (e.g., wheelchair, bedside commode, etc,.)?: Total Help needed moving to and from a bed to chair (including a wheelchair)?: Total Help needed walking in hospital room?: Total Help needed climbing 3-5 steps with a railing? : Total 6 Click Score: 6    End of Session Equipment Utilized During Treatment: Oxygen Activity Tolerance: Patient tolerated treatment well Patient left: in bed;with call bell/phone within reach;with bed alarm set Nurse Communication: Mobility status PT Visit Diagnosis: Muscle weakness (generalized) (M62.81);Difficulty in walking, not elsewhere classified (R26.2)     Time: 6606-3016 PT Time Calculation (min) (ACUTE ONLY): 28 min  Charges:  $Therapeutic Activity: 23-37 mins                    G Codes:       [name], PT, DPT Acute Rehabilitation Services Pager: [phone]    [name] 03/28/2017, 2:59 PM

## 2017-03-29 ENCOUNTER — Inpatient Hospital Stay (HOSPITAL_COMMUNITY): Payer: 59

## 2017-03-29 LAB — GLUCOSE, CAPILLARY
GLUCOSE-CAPILLARY: 133 mg/dL — AB (ref 65–99)
GLUCOSE-CAPILLARY: 102 mg/dL — AB (ref 65–99)
Glucose-Capillary: 117 mg/dL — ABNORMAL HIGH (ref 65–99)
Glucose-Capillary: 117 mg/dL — ABNORMAL HIGH (ref 65–99)
Glucose-Capillary: 97 mg/dL (ref 65–99)

## 2017-03-29 NOTE — Progress Notes (Signed)
No issues overnight.   EXAM:  BP (!) 96/58   Pulse 82   Temp 99.8 F (37.7 C) (Oral)   Resp 18   Ht 5\' 7"  (1.702 m)   Wt 102.6 kg (226 lb 3.1 oz)   LMP  (LMP Unknown) Comment: pt is unresponsive, and is unable to communicate  SpO2 100%   BMI 35.43 kg/m   Awake, alert Nods appropriately to questions CN grossly intact  Moves all extremities distally, very weak proximally. EVD clamped  IMPRESSION:  46 y.o. female s/p SAH, doing well with EVD clamped for almost 48 hrs.  PLAN: - Will get CTH and likely d/c EVD

## 2017-03-30 LAB — GLUCOSE, CAPILLARY
GLUCOSE-CAPILLARY: 129 mg/dL — AB (ref 65–99)
GLUCOSE-CAPILLARY: 97 mg/dL (ref 65–99)
GLUCOSE-CAPILLARY: 102 mg/dL — AB (ref 65–99)
Glucose-Capillary: 102 mg/dL — ABNORMAL HIGH (ref 65–99)
Glucose-Capillary: 103 mg/dL — ABNORMAL HIGH (ref 65–99)
Glucose-Capillary: 124 mg/dL — ABNORMAL HIGH (ref 65–99)
Glucose-Capillary: 127 mg/dL — ABNORMAL HIGH (ref 65–99)

## 2017-03-30 NOTE — Progress Notes (Addendum)
EVD removed at request of Dr Kathyrn Sheriff No complications Pt tolerated well No drainage from staple site

## 2017-03-30 NOTE — Progress Notes (Signed)
No issues overnight.   EXAM:  BP 120/67 (BP Location: Left Arm)   Pulse 81   Temp 98.8 F (37.1 C) (Axillary)   Resp (!) 22   Ht 5\' 7"  (1.702 m)   Wt 98.4 kg (216 lb 14.9 oz)   LMP  (LMP Unknown) Comment: pt is unresponsive, and is unable to communicate  SpO2 100%   BMI 33.98 kg/m   Awake, alert Able to whisper her name FC all extremities distally, very weak proximally EVD remains clamped  IMPRESSION:  46 y.o. female s/p SAH, neurologically stable with EVD clamped x72 hrs  PLAN: - D/C evd

## 2017-03-31 ENCOUNTER — Encounter (HOSPITAL_COMMUNITY): Payer: Self-pay

## 2017-03-31 LAB — GLUCOSE, CAPILLARY
GLUCOSE-CAPILLARY: 102 mg/dL — AB (ref 65–99)
GLUCOSE-CAPILLARY: 104 mg/dL — AB (ref 65–99)
Glucose-Capillary: 104 mg/dL — ABNORMAL HIGH (ref 65–99)
Glucose-Capillary: 128 mg/dL — ABNORMAL HIGH (ref 65–99)
Glucose-Capillary: 80 mg/dL (ref 65–99)

## 2017-03-31 MED ORDER — OSMOLITE 1.2 CAL PO LIQD
1000.0000 mL | ORAL | Status: DC
  Filled 2017-03-31 (×3): qty 1000

## 2017-03-31 MED ORDER — PRO-STAT SUGAR FREE PO LIQD
30.0000 mL | Freq: Every day | ORAL | Status: DC
  Administered 2017-04-01 – 2017-04-09 (×9): 30 mL
  Filled 2017-03-31 (×10): qty 30

## 2017-03-31 NOTE — Consult Note (Signed)
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 Kathyrn Sheriff   HPI: Carly [name] is a 46 y.o. female with history of HTN, GERD, migraines who was admitted on 03/03/17 with HA and focal seizure activity and developed progressive decrease in LOC requiring intubation in ED. 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 Intraventricular catheter placed by Dr. Cyndy Freeze and she underwent cerebral angio with coiling of L-ICA aneurysm by Dr. Kathyrn Sheriff.  Hospital course significant for blood drainage from IVC, multiple episodes of self extubation, HCAP, hypotension, lack of responsiveness as well as signs of minimal neurologic improvement.   She was made DNR and family elected extubation with monitoring. EVD clamped with follow up CCT showing marked increase in hydrocephalus but no change in MS therefore discontinued 7/14. Therapy ongoing and she is showing improvement in ability to communicate and ability to sit at EOB. CIR recommended by rehab team.    Review of Systems  Unable to perform ROS: Patient nonverbal      Past Medical History:  Diagnosis Date  . GERD (gastroesophageal reflux disease)   . Hypertension   . Migraines    Past Surgical History:  Procedure Laterality Date  . CESAREAN SECTION    . IR ANGIO INTRA EXTRACRAN SEL INTERNAL CAROTID UNI R MOD SED  03/03/2017  . IR ANGIO VERTEBRAL SEL VERTEBRAL BILAT MOD SED  03/03/2017  . IR ANGIOGRAM FOLLOW UP STUDY  03/03/2017  . IR ANGIOGRAM FOLLOW UP STUDY  03/03/2017  . IR ANGIOGRAM FOLLOW UP STUDY  03/03/2017  . IR ANGIOGRAM FOLLOW UP STUDY  03/03/2017  . IR TRANSCATH/EMBOLIZ  03/03/2017  . RADIOLOGY WITH ANESTHESIA N/A 03/03/2017   Procedure: RADIOLOGY WITH ANESTHESIA;  Surgeon: Consuella Lose, MD;   Location: [facility];  Service: Radiology;  Laterality: N/A;    Family History  Problem Relation Age of Onset  . Diabetes Mother   . Hypertension Mother   . Hyperlipidemia Mother   . Cancer Father        colon cancer  . Diabetes Maternal Grandmother   . Hyperlipidemia Paternal Grandfather     Social History:  Married. Independent PTA? Per reports that she has never smoked. She has never used smokeless tobacco. Per reports that she drinks alcohol.  Per reports that she does not use drugs.    Allergies: No Known Allergies    Medications Prior to Admission  Medication Sig Dispense Refill  . butalbital-acetaminophen-caffeine (FIORICET WITH CODEINE) 50-325-40-30 MG capsule Take 1 capsule by mouth every 4 (four) hours as needed for headache. 30 capsule 0  . cyclobenzaprine (FLEXERIL) 10 MG tablet Take 10 mg by mouth at bedtime as needed for muscle spasms.    . Ibuprofen (MIDOL) 200 MG CAPS Take 1-2 capsules by mouth daily as needed.    . triamterene-hydrochlorothiazide (MAXZIDE-25) 37.5-25 MG per tablet Take 1 tablet by mouth daily. 90 tablet 3    Home: Home Living Family/patient expects to be discharged to:: Private residence Living Arrangements: Spouse/significant other Additional Comments: no family present, pt unable to communicate PLOF  Functional History: Prior Function Level of Independence: Independent Comments: per chart pt indep Functional Status:  Mobility: Bed Mobility Overal bed mobility: Needs Assistance Bed Mobility: Supine to Sit, Sit to Supine, Rolling Rolling: Max assist, +2 for physical assistance, +2 for safety/equipment Supine to sit:  Max assist, +2 for physical assistance, +2 for safety/equipment Sit to supine: Max assist, +2 for physical assistance, +2 for safety/equipment General bed mobility comments: pt unable to facilitate any part of transfer. pt dependent for LE and trunk management Transfers General transfer comment: unable Ambulation/Gait General  Gait Details: unable    ADL:    Cognition: Cognition Overall Cognitive Status: Impaired/Different from baseline Arousal/Alertness: Awake/alert Orientation Level: Oriented to person Attention: Sustained Sustained Attention: Impaired Sustained Attention Impairment: Verbal basic, Functional basic Comments: slow processing Cognition Arousal/Alertness: Awake/alert Behavior During Therapy: Flat affect Overall Cognitive Status: Impaired/Different from baseline Area of Impairment: Following commands, Attention, Problem solving Current Attention Level: Focused Following Commands: Follows one step commands with increased time, Follows one step commands inconsistently Problem Solving: Slow processing, Decreased initiation, Difficulty sequencing, Requires verbal cues, Requires tactile cues General Comments: pt non verbal and unable to answer questions, pt shook head yes/no to simple questions but not consistently  Blood pressure 117/68, pulse 74, temperature 99.1 F (37.3 C), temperature source Oral, resp. rate 18, height 5\' 7"  (1.702 m), weight 98.5 kg (217 lb 2.5 oz), SpO2 100 %. Physical Exam  Nursing note and vitals reviewed. Constitutional: She appears well-developed and well-nourished. She appears lethargic. Nasal cannula in place.  Obese female with [place] in nares.   HENT:  Head: Normocephalic and atraumatic.  Eyes: Pupils are equal, round, and reactive to light. Conjunctivae are normal.  Neck:  Left inattention with decreased ROM to left.   Cardiovascular: Normal rate and regular rhythm.   Respiratory: Effort normal and breath sounds normal. No stridor. No respiratory distress. She has no wheezes.  GI: Soft. Bowel sounds are normal. She exhibits no distension. There is no tenderness.  Rectal tube in place.   Genitourinary:  Genitourinary Comments: Foley in place.   Neurological: She appears lethargic. A cranial nerve deficit is present.  Lethargic with extremely slow  movements. She was unable to move eyes beyond midline from left field. Non verbal and unable to open mouth or make any sounds--- apraxic? Marland Kitchen Able to nod yes to most basic orientation questions appropriately. LUE and LLE 1-2/5 grossly. RUE and RLE tr -1/5. Doesn't withdrawal to pinch.   Skin: Skin is warm and dry.    Results for orders placed or performed during the hospital encounter of 03/03/17 (from the past 24 hour(s))  Glucose, capillary     Status: Abnormal   Collection Time: 03/30/17 11:39 AM  Result Value Ref Range   Glucose-Capillary 129 (H) 65 - 99 mg/dL  Glucose, capillary     Status: None   Collection Time: 03/30/17  3:24 PM  Result Value Ref Range   Glucose-Capillary 97 65 - 99 mg/dL  Glucose, capillary     Status: Abnormal   Collection Time: 03/30/17  7:22 PM  Result Value Ref Range   Glucose-Capillary 102 (H) 65 - 99 mg/dL  Glucose, capillary     Status: Abnormal   Collection Time: 03/30/17 11:13 PM  Result Value Ref Range   Glucose-Capillary 102 (H) 65 - 99 mg/dL  Glucose, capillary     Status: Abnormal   Collection Time: 03/31/17  4:14 AM  Result Value Ref Range   Glucose-Capillary 102 (H) 65 - 99 mg/dL  Glucose, capillary     Status: Abnormal   Collection Time: 03/31/17  7:26 AM  Result Value Ref Range   Glucose-Capillary 104 (H) 65 - 99 mg/dL   Ct Head Wo Contrast  Result Date: 03/29/2017 CLINICAL DATA:  Post  aneurysmal hemorrhage hydrocephalus. Assess for increase in ventricular size following extraventricular drain clamping x 48 hours. EXAM: CT HEAD WITHOUT CONTRAST TECHNIQUE: Contiguous axial images were obtained from the base of the skull through the vertex without intravenous contrast. COMPARISON:  The most recent CT scan was performed 03/07/2017. FINDINGS: Brain: Marked increase in hydrocephalus, compared to the previous exam. Biventricular diameter was 32 mm on 03/07/2017. Biventricular diameter today is 39 mm. The temporal horns are also increased in size, a  reliable indicator of hydrocephalus. Third ventricle and fourth ventricle enlargement is also seen. Hounsfield artifact from aneurysm coil mass. Chronic LEFT frontal infarction. Extraventricular drain catheter passes through the ventricles, and lies in the LEFT thalamus. Vascular: No hyperdense vessel or unexpected calcification. Skull: Normal. Negative for fracture or focal lesion. Sinuses/Orbits: No acute finding. Other: None. IMPRESSION: Marked increase in hydrocephalus compared to the previous exam. Biventricular diameter increased 7 mm compared with most recent priors. See discussion above. Electronically Signed   By: Staci Righter M.D.   On: 03/29/2017 14:31    Assessment/Plan: Diagnosis: left ICA aneurysm with resultant left frontal hemorrhage s/p coiling 1. Does the need for close, 24 hr/day medical supervision in concert with the patient's rehab needs make it unreasonable for this patient to be served in a less intensive setting? Yes 2. Co-Morbidities requiring supervision/potential complications: htn, obesity,  3. Due to bladder management, bowel management, safety, skin/wound care, disease management, medication administration, pain management and patient education, does the patient require 24 hr/day rehab nursing? Yes 4. Does the patient require coordinated care of a physician, rehab nurse, PT (1-2 hrs/day, 5 days/week), OT (1-2 hrs/day, 5 days/week) and SLP (1-2 hrs/day, 5 days/week) to address physical and functional deficits in the context of the above medical diagnosis(es)? Yes Addressing deficits in the following areas: balance, endurance, locomotion, strength, transferring, bowel/bladder control, bathing, dressing, feeding, grooming, toileting, cognition, speech, language, swallowing and psychosocial support 5. Can the patient actively participate in an intensive therapy program of at least 3 hrs of therapy per day at least 5 days per week? Potentially 6. The potential for patient to  make measurable gains while on inpatient rehab is good 7. Anticipated functional outcomes upon discharge from inpatient rehab are mod assist  with PT, mod assist with OT, min assist and mod assist with SLP. 8. Estimated rehab length of stay to reach the above functional goals is: 20-30 days 9. Anticipated D/C setting: Home 10. Anticipated post D/C treatments: HH therapy and Outpatient therapy 11. Overall Rehab/Functional Prognosis: good  RECOMMENDATIONS: This patient's condition is appropriate for continued rehabilitative care in the following setting: eventually CIR once activity tolerance increases. Patient has agreed to participate in recommended program. N/A Note that insurance prior authorization may be required for reimbursement for recommended care.  Comment: Rehab Admissions Coordinator to follow up.  Thanks,  [name], MD, Tilford Pillar, PA-C 03/31/2017

## 2017-03-31 NOTE — Progress Notes (Signed)
  Speech Language Pathology Treatment: Dysphagia;Cognitive-Linquistic  Patient Details Name: Carly [name] MRN: [id number] DOB: 06/08/1971 Today's Date: 03/31/2017 Time: 7106-2694 SLP Time Calculation (min) (ACUTE ONLY): 13 min  Assessment / Plan / Recommendation Clinical Impression  Pt shakes her head "no" when asked if she is tired, but she does seem to struggle with keeping her eyes open. She needs Mod cues for sustained attention but will nod her head yes/no simple questions with seemingly appropriate answers overall. She rubbed her lips together on command with Mod cues including tactile stimulation and visual gestures. Otherwise, she still does not follow commands orally. She consumed a few boluses of ice chips and thin liquids with multiple swallows observed. Anterior spillage seems to be related to generalized weakness/reduced awareness during administration of liquids. She still does not vocalize. Recommend to continue with current plan of care with SLP f/u for potential readiness for FEES pending improved alertness and endurance for PO intake.   HPI HPI: 46 y.o. female  with past medical history of Hypertension, GERD, migraine headaches admitted on 03/03/2017 with headache and focal seizure activity. CT scan of the head showed subarachnoid hemorrhage with associated IVH in the setting of presumed ruptured aneurysm. Patient developed progressive decrease in level of consciousness, vomited, postured. She was intubated for airway protection and IVC placed per neurosurgery. 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 Positive for active extravasation of hemorrhage. Patient underwent embolization on 03/07/2017.       SLP Plan  Continue with current plan of care       Recommendations  Diet recommendations: NPO Medication Administration: Via alternative means                 Oral Care Recommendations: Oral care QID Follow up Recommendations: Inpatient Rehab SLP Visit Diagnosis: Aphasia (R47.01);Cognitive communication deficit (R41.841);Dysphagia, unspecified (R13.10) Plan: Continue with current plan of care       GO                Germain Osgood 03/31/2017, 9:37 AM  Germain Osgood, M.A. CCC-SLP [phone]

## 2017-03-31 NOTE — Progress Notes (Signed)
Nutrition Follow-up  DOCUMENTATION CODES:   Obesity unspecified  INTERVENTION:   D/C Vital High Protein  Osmolite 1.2 @ 60 ml/hr (1440 ml/day) 30 ml Prostat daily Provides: 1828 kcal, 95 grams protein, and 1180 ml free water.    NUTRITION DIAGNOSIS:   Inadequate oral intake related to inability to eat as evidenced by NPO status. Ongoing.   GOAL:   Patient will meet greater than or equal to 90% of their needs Progressing.   MONITOR:   I & O's, TF tolerance, Skin  ASSESSMENT:   Pt with PMH of HTN, GERD, and migraines admitted with SAH, IVH, ruptured aneurysm s/p coiling of left ICA aneurysm and IVC placed 6/18.  7/11 one-way extubation, pt then began following commands 7/15 EVD removed  Pt discussed during ICU rounds and with RN.  Plan for PEG 7/17 Hopeful for rehab admission  Vital High Protein @ 50 ml/hr with 30 ml Prostat BID: 1400 kcal and 135 grams protein  Diet Order:  Diet NPO time specified  Skin:   (Pressure injury groin)  Last BM:  7/15 small  Height:   Ht Readings from Last 1 Encounters:  03/03/17 5\' 7"  (1.702 m)    Weight:   Wt Readings from Last 1 Encounters:  03/31/17 217 lb 2.5 oz (98.5 kg)    Ideal Body Weight:  61.3 kg  BMI:  Body mass index is 34.01 kg/m.  Estimated Nutritional Needs:   Kcal:  1700-1900  Protein:  90-110 grams  Fluid:  > 2 L/day  EDUCATION NEEDS:   No education needs identified at this time  [address], [place], [place] Pager [phone] After Hours Pager

## 2017-03-31 NOTE — Progress Notes (Signed)
No issues overnight.   EXAM:  BP 117/68   Pulse 74   Temp 99.1 F (37.3 C) (Oral)   Resp 18   Ht 5\' 7"  (1.702 m)   Wt 98.5 kg (217 lb 2.5 oz)   LMP  (LMP Unknown) Comment: pt is unresponsive, and is unable to communicate  SpO2 100%   BMI 34.01 kg/m   Sleeping but easily arousable FC all extremities distally, very weak proximally EVD site c/d/i  IMPRESSION:  46 y.o. female s/p SAH, neurologically stable.  PLAN: - can transfer to stepdown - Will get PMR consult for CIR - Surgery consult for PEG

## 2017-03-31 NOTE — Care Management Note (Signed)
Case Management Note  Patient Details  Name: MADILYNNE MULLAN MRN: [id number] Date of Birth: 1971-05-29  Subjective/Objective:  Pt admitted on 03/03/17 with AMS and ICH.  PTA, pt independent, lives with spouse.                    Action/Plan: Pt currently remains sedated and intubated.  Will follow for discharge planning as pt progresses.    Expected Discharge Date:                  Expected Discharge Plan:  IP Rehab Facility  In-House Referral:  Clinical Social Work  Discharge planning Services  CM Consult  Post Acute Care Choice:    Choice offered to:     DME Arranged:    DME Agency:     HH Arranged:    Heath Agency:     Status of Service:  In process, will continue to follow  If discussed at Long Length of Stay Meetings, dates discussed:    Additional Comments:  03/25/17 J. Simar Pothier, RN, BSN Family gathered at bedside for extubation.  Husband has chosen not to be present.    03/31/17 J. Daishon Chui, RN, BSN Pt now awake and following commands.  Plan PEG placement tomorrow, per surgical team.  Pt has orders for transfer to SDU.   EVD discontinued.  Will continue to follow progress.    Reinaldo Raddle, RN, BSN  Trauma/Neuro ICU Case Manager [phone]

## 2017-03-31 NOTE — Progress Notes (Signed)
Physical Therapy Treatment Patient Details Name: Carly [name] MRN: [id number] DOB: 1971/01/20 Today's Date: 03/31/2017    History of Present Illness Pt is a 46 y/o female with PMH of HTN and migraines. She was admitted on 03/03/2017 with headache and focal seizure activity. CT scan of the head showed subarachnoid hemorrhage with associated IVH in the setting of presumed ruptured aneurysm. Patient developed progressive decrease in level of consciousness, vomited, postured. She was intubated for airway protection and IVC placed per neurosurgery. 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 Positive for active extravasation of hemorrhage. Patient underwent embolization on 03/07/2017.     PT Comments    Pt progressing slowly towards physical therapy goals. Was able to perform transfers to/from EOB with +2 max assist for all aspects. In sitting, pt tolerated ~15 minutes core stabilization and sitting balance activity. Noted increased effort to push herself back into upright posture from L elbow compared to R. Husband present and engaged in session but stepped out to get other family members while PT was in the room. Will continue to follow and progress as able per POC.   Follow Up Recommendations  CIR     Equipment Recommendations  Wheelchair (measurements PT);Wheelchair cushion (measurements PT)    Recommendations for Other Services Rehab consult     Precautions / Restrictions Precautions Precautions: Fall Precaution Comments: ventric and foley now d/c Restrictions Weight Bearing Restrictions: No    Mobility  Bed Mobility Overal bed mobility: Needs Assistance Bed Mobility: Supine to Sit;Sit to Supine;Rolling Rolling: Max assist;+2 for physical assistance;+2 for safety/equipment   Supine to sit: Max assist;+2 for physical assistance;+2 for  safety/equipment Sit to supine: Max assist;+2 for physical assistance;+2 for safety/equipment   General bed mobility comments: pt unable to facilitate any part of transfer. pt dependent for LE and trunk management  Transfers                 General transfer comment: unable  Ambulation/Gait             General Gait Details: unable   Stairs            Wheelchair Mobility    Modified Rankin (Stroke Patients Only) Modified Rankin (Stroke Patients Only) Pre-Morbid Rankin Score: No symptoms Modified Rankin: Severe disability     Balance Overall balance assessment: Needs assistance Sitting-balance support: Feet unsupported;Bilateral upper extremity supported Sitting balance-Leahy Scale: Zero Sitting balance - Comments: total assist to maintain sitting EOB, pt tolerated sitting x 10 min Postural control: Posterior lean                                  Cognition Arousal/Alertness: Awake/alert Behavior During Therapy: Flat affect Overall Cognitive Status: Impaired/Different from baseline Area of Impairment: Following commands;Attention;Problem solving                   Current Attention Level: Focused   Following Commands: Follows one step commands with increased time;Follows one step commands inconsistently     Problem Solving: Slow processing;Decreased initiation;Difficulty sequencing;Requires verbal cues;Requires tactile cues General Comments: pt non verbal and unable to answer questions, pt shook head yes/no to simple questions but not consistently      Exercises      General Comments        Pertinent Vitals/Pain Pain Assessment: Faces  Faces Pain Scale: No hurt    Home Living                      Prior Function            PT Goals (current goals can now be found in the care plan section) Acute Rehab PT Goals Patient Stated Goal: didn't state PT Goal Formulation: Patient unable to participate in goal  setting Time For Goal Achievement: 04/09/17 Potential to Achieve Goals: Fair Progress towards PT goals: Progressing toward goals    Frequency    Min 3X/week      PT Plan Current plan remains appropriate    Co-evaluation              AM-PAC PT "6 Clicks" Daily Activity  Outcome Measure  Difficulty turning over in bed (including adjusting bedclothes, sheets and blankets)?: Total Difficulty moving from lying on back to sitting on the side of the bed? : Total Difficulty sitting down on and standing up from a chair with arms (e.g., wheelchair, bedside commode, etc,.)?: Total Help needed moving to and from a bed to chair (including a wheelchair)?: Total Help needed walking in hospital room?: Total Help needed climbing 3-5 steps with a railing? : Total 6 Click Score: 6    End of Session Equipment Utilized During Treatment: Oxygen Activity Tolerance: Patient tolerated treatment well Patient left: in bed;with call bell/phone within reach;with bed alarm set Nurse Communication: Mobility status PT Visit Diagnosis: Muscle weakness (generalized) (M62.81);Difficulty in walking, not elsewhere classified (R26.2)     Time: 6237-6283 PT Time Calculation (min) (ACUTE ONLY): 39 min  Charges:  $Therapeutic Activity: 38-52 mins                    G Codes:       [name], PT, DPT Acute Rehabilitation Services Pager: [phone]    [name] 03/31/2017, 12:05 PM

## 2017-03-31 NOTE — Consult Note (Signed)
Reason for Consult:PEG tube placement Referring Physician: Teigan Manner is an 46 y.o. female.  HPI: Admitted on June 18 with ruptured aneurysm, posturing, had subsequent coiling, but has been left with a significant deficit.  She has a feeding tube in place, but has not been able to pass a swallowing examination.  She is working with PT/OT.  PMHx only for hypertension and migraines.  C-sxn x 3.  Past Medical History:  Diagnosis Date  . GERD (gastroesophageal reflux disease)   . Hypertension   . Migraines     Past Surgical History:  Procedure Laterality Date  . CESAREAN SECTION    . IR ANGIO INTRA EXTRACRAN SEL INTERNAL CAROTID UNI R MOD SED  03/03/2017  . IR ANGIO VERTEBRAL SEL VERTEBRAL BILAT MOD SED  03/03/2017  . IR ANGIOGRAM FOLLOW UP STUDY  03/03/2017  . IR ANGIOGRAM FOLLOW UP STUDY  03/03/2017  . IR ANGIOGRAM FOLLOW UP STUDY  03/03/2017  . IR ANGIOGRAM FOLLOW UP STUDY  03/03/2017  . IR TRANSCATH/EMBOLIZ  03/03/2017  . RADIOLOGY WITH ANESTHESIA N/A 03/03/2017   Procedure: RADIOLOGY WITH ANESTHESIA;  Surgeon: Consuella Lose, MD;  Location: [facility];  Service: Radiology;  Laterality: N/A;    Family History  Problem Relation Age of Onset  . Diabetes Mother   . Hypertension Mother   . Hyperlipidemia Mother   . Cancer Father        colon cancer  . Diabetes Maternal Grandmother   . Hyperlipidemia Paternal Grandfather     Social History:  reports that she has never smoked. She has never used smokeless tobacco. She reports that she drinks alcohol. She reports that she does not use drugs.  Allergies: No Known Allergies  Medications: I have reviewed the patient's current medications.  Results for orders placed or performed during the hospital encounter of 03/03/17 (from the past 48 hour(s))  Glucose, capillary     Status: Abnormal   Collection Time: 03/29/17 12:11 PM  Result Value Ref Range   Glucose-Capillary 117 (H) 65 - 99 mg/dL  Glucose, capillary     Status:  Abnormal   Collection Time: 03/29/17  3:50 PM  Result Value Ref Range   Glucose-Capillary 102 (H) 65 - 99 mg/dL  Glucose, capillary     Status: None   Collection Time: 03/29/17  8:09 PM  Result Value Ref Range   Glucose-Capillary 97 65 - 99 mg/dL  Glucose, capillary     Status: Abnormal   Collection Time: 03/30/17 12:24 AM  Result Value Ref Range   Glucose-Capillary 103 (H) 65 - 99 mg/dL  Glucose, capillary     Status: Abnormal   Collection Time: 03/30/17  4:00 AM  Result Value Ref Range   Glucose-Capillary 127 (H) 65 - 99 mg/dL  Glucose, capillary     Status: Abnormal   Collection Time: 03/30/17  7:56 AM  Result Value Ref Range   Glucose-Capillary 124 (H) 65 - 99 mg/dL  Glucose, capillary     Status: Abnormal   Collection Time: 03/30/17 11:39 AM  Result Value Ref Range   Glucose-Capillary 129 (H) 65 - 99 mg/dL  Glucose, capillary     Status: None   Collection Time: 03/30/17  3:24 PM  Result Value Ref Range   Glucose-Capillary 97 65 - 99 mg/dL  Glucose, capillary     Status: Abnormal   Collection Time: 03/30/17  7:22 PM  Result Value Ref Range   Glucose-Capillary 102 (H) 65 - 99 mg/dL  Glucose,  capillary     Status: Abnormal   Collection Time: 03/30/17 11:13 PM  Result Value Ref Range   Glucose-Capillary 102 (H) 65 - 99 mg/dL  Glucose, capillary     Status: Abnormal   Collection Time: 03/31/17  4:14 AM  Result Value Ref Range   Glucose-Capillary 102 (H) 65 - 99 mg/dL  Glucose, capillary     Status: Abnormal   Collection Time: 03/31/17  7:26 AM  Result Value Ref Range   Glucose-Capillary 104 (H) 65 - 99 mg/dL    Ct Head Wo Contrast  Result Date: 03/29/2017 CLINICAL DATA:  Post aneurysmal hemorrhage hydrocephalus. Assess for increase in ventricular size following extraventricular drain clamping x 48 hours. EXAM: CT HEAD WITHOUT CONTRAST TECHNIQUE: Contiguous axial images were obtained from the base of the skull through the vertex without intravenous contrast. COMPARISON:   The most recent CT scan was performed 03/07/2017. FINDINGS: Brain: Marked increase in hydrocephalus, compared to the previous exam. Biventricular diameter was 32 mm on 03/07/2017. Biventricular diameter today is 39 mm. The temporal horns are also increased in size, a reliable indicator of hydrocephalus. Third ventricle and fourth ventricle enlargement is also seen. Hounsfield artifact from aneurysm coil mass. Chronic LEFT frontal infarction. Extraventricular drain catheter passes through the ventricles, and lies in the LEFT thalamus. Vascular: No hyperdense vessel or unexpected calcification. Skull: Normal. Negative for fracture or focal lesion. Sinuses/Orbits: No acute finding. Other: None. IMPRESSION: Marked increase in hydrocephalus compared to the previous exam. Biventricular diameter increased 7 mm compared with most recent priors. See discussion above. Electronically Signed   By: Staci Righter M.D.   On: 03/29/2017 14:31    Review of Systems  Constitutional: Negative for chills and fever.  Gastrointestinal:       Dysphagia  All other systems reviewed and are negative.  Blood pressure 117/68, pulse 74, temperature 99.1 F (37.3 C), temperature source Oral, resp. rate 18, height 5\' 7"  (1.702 m), weight 98.5 kg (217 lb 2.5 oz), SpO2 100 %. Physical Exam  Vitals reviewed. Constitutional: She is oriented to person, place, and time. She appears well-developed. She appears lethargic.  Obese  HENT:  Head: Atraumatic.  Eyes: Pupils are equal, round, and reactive to light.  Neck: Normal range of motion. Neck supple.  Cardiovascular: Normal rate, regular rhythm and normal heart sounds.   Respiratory: Effort normal and breath sounds normal.  GI: Soft. Bowel sounds are normal.  No upper abdominal scars.  Has periumbilical scar  Musculoskeletal: Normal range of motion.  Neurological: She is oriented to person, place, and time. She appears lethargic. GCS eye subscore is 4. GCS verbal subscore is 1.  GCS motor subscore is 4.  Skin: Skin is warm and dry.  Psychiatric: Her affect is blunt. Her speech is not slurred. She is slowed.    Assessment/Plan: 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 Will place PEG tomorrow AM.  I have spoken with my partner, Dr. Grandville Silos, who will perform the procedure tomorrow. Julitza Rickles 03/31/2017, 11:14 AM

## 2017-04-01 ENCOUNTER — Encounter (HOSPITAL_COMMUNITY)
Admission: EM | Disposition: A | Discharge status: Rehabilitation Facility | Payer: Self-pay | Source: Home / Self Care | Attending: Neurosurgery

## 2017-04-01 ENCOUNTER — Encounter (HOSPITAL_COMMUNITY): Payer: Self-pay | Admitting: *Deleted

## 2017-04-01 ENCOUNTER — Inpatient Hospital Stay (HOSPITAL_COMMUNITY): Payer: 59 | Admitting: Certified Registered"

## 2017-04-01 HISTORY — PX: ESOPHAGOGASTRODUODENOSCOPY: SHX5428

## 2017-04-01 HISTORY — PX: PEG PLACEMENT: SHX5437

## 2017-04-01 LAB — GLUCOSE, CAPILLARY
GLUCOSE-CAPILLARY: 141 mg/dL — AB (ref 65–99)
Glucose-Capillary: 96 mg/dL (ref 65–99)
Glucose-Capillary: 119 mg/dL — ABNORMAL HIGH (ref 65–99)
Glucose-Capillary: 130 mg/dL — ABNORMAL HIGH (ref 65–99)
Glucose-Capillary: 129 mg/dL — ABNORMAL HIGH (ref 65–99)
Glucose-Capillary: 153 mg/dL — ABNORMAL HIGH (ref 65–99)

## 2017-04-01 LAB — SURGICAL PCR SCREEN
MRSA, PCR: NEGATIVE
STAPHYLOCOCCUS AUREUS: NEGATIVE

## 2017-04-01 SURGERY — EGD (ESOPHAGOGASTRODUODENOSCOPY)
Anesthesia: Monitor Anesthesia Care

## 2017-04-01 MED ORDER — PHENYLEPHRINE HCL 10 MG/ML IJ SOLN
INTRAVENOUS | Status: DC | PRN
  Administered 2017-04-01 (×2): 50 ug/min via INTRAVENOUS

## 2017-04-01 MED ORDER — ETOMIDATE 2 MG/ML IV SOLN: 40.0000 mg | Freq: Once | INTRAVENOUS | Status: DC

## 2017-04-01 MED ORDER — PROPOFOL 500 MG/50ML IV EMUL
INTRAVENOUS | Status: DC | PRN
  Administered 2017-04-01: 25 ug/kg/min via INTRAVENOUS

## 2017-04-01 MED ORDER — EPHEDRINE SULFATE 50 MG/ML IJ SOLN
INTRAMUSCULAR | Status: DC | PRN
  Administered 2017-04-01 (×2): 10 mg via INTRAVENOUS

## 2017-04-01 MED ORDER — HYDROCODONE-ACETAMINOPHEN 7.5-325 MG/15ML PO SOLN
10.0000 mL | Freq: Four times a day (QID) | ORAL | Status: DC | PRN
  Filled 2017-04-01: qty 15

## 2017-04-01 MED ORDER — LACTATED RINGERS IV SOLN
INTRAVENOUS | Status: DC | PRN
  Administered 2017-04-01: 07:00:00 via INTRAVENOUS

## 2017-04-01 MED ORDER — HYDROCODONE-ACETAMINOPHEN 7.5-325 MG/15ML PO SOLN
10.0000 mL | Freq: Four times a day (QID) | ORAL | Status: DC | PRN
  Administered 2017-04-01 – 2017-04-06 (×7): 10 mL
  Filled 2017-04-01 (×6): qty 15

## 2017-04-01 MED ORDER — OSMOLITE 1.2 CAL PO LIQD
1000.0000 mL | ORAL | Status: DC
  Administered 2017-04-01 – 2017-04-06 (×7): 1000 mL
  Filled 2017-04-01 (×15): qty 1000

## 2017-04-01 MED ORDER — CEFAZOLIN SODIUM-DEXTROSE 2-4 GM/100ML-% IV SOLN
2.0000 g | Freq: Once | INTRAVENOUS | Status: AC
  Administered 2017-04-01: 2 g via INTRAVENOUS

## 2017-04-01 MED ORDER — CEFAZOLIN SODIUM-DEXTROSE 2-4 GM/100ML-% IV SOLN
INTRAVENOUS | Status: AC
  Filled 2017-04-01: qty 100

## 2017-04-01 MED ORDER — MORPHINE SULFATE (PF) 2 MG/ML IV SOLN
2.0000 mg | INTRAVENOUS | Status: DC | PRN
  Administered 2017-04-01 – 2017-04-06 (×6): 2 mg via INTRAVENOUS
  Filled 2017-04-01 (×6): qty 1

## 2017-04-01 MED ORDER — PROPOFOL 500 MG/50ML IV EMUL: 5.0000 ug/kg/min | Freq: Once | INTRAVENOUS | Status: DC

## 2017-04-01 MED ORDER — FENTANYL CITRATE (PF) 100 MCG/2ML IJ SOLN: 200.0000 ug | Freq: Once | INTRAMUSCULAR | Status: DC

## 2017-04-01 MED ORDER — VECURONIUM BROMIDE 10 MG IV SOLR: 10.0000 mg | Freq: Once | INTRAVENOUS | Status: DC

## 2017-04-01 MED ORDER — PHENYLEPHRINE HCL 10 MG/ML IJ SOLN
INTRAMUSCULAR | Status: DC | PRN
  Administered 2017-04-01 (×5): 80 ug via INTRAVENOUS

## 2017-04-01 MED ORDER — FENTANYL CITRATE (PF) 100 MCG/2ML IJ SOLN
INTRAMUSCULAR | Status: DC | PRN
  Administered 2017-04-01 (×2): 25 ug via INTRAVENOUS

## 2017-04-01 MED ORDER — MIDAZOLAM HCL 2 MG/2ML IJ SOLN: 4.0000 mg | Freq: Once | INTRAMUSCULAR | Status: DC

## 2017-04-01 NOTE — Anesthesia Procedure Notes (Signed)
Procedure Name: MAC Date/Time: 04/01/2017 7:55 AM Performed by: Lavell Luster Pre-anesthesia Checklist: Patient identified, Emergency Drugs available, Suction available, Patient being monitored and Timeout performed Patient Re-evaluated:Patient Re-evaluated prior to induction Oxygen Delivery Method: Nasal cannula Preoxygenation: Pre-oxygenation with 100% oxygen Induction Type: IV induction Placement Confirmation: positive ETCO2 and breath sounds checked- equal and bilateral Dental Injury: Teeth and Oropharynx as per pre-operative assessment

## 2017-04-01 NOTE — Progress Notes (Signed)
No issues overnight. PEG placed today  EXAM:  BP 105/75 (BP Location: Left Wrist)   Pulse 98   Temp 99.4 F (37.4 C) (Oral)   Resp 20   Ht 5\' 7"  (1.702 m)   Wt 101.1 kg (222 lb 14.2 oz)   LMP  (LMP Unknown) Comment: pt is unresponsive, and is unable to communicate  SpO2 99%   BMI 34.91 kg/m   Awake, alert,  CN grossly intact  MAE to command distally, very weak proximally  IMPRESSION:  46 y.o. female s/p SAH. Neurologically and medically stable. PEG placed today. Would be ready for CIR when bed available.  PLAN: - Cont current mgmt

## 2017-04-01 NOTE — Progress Notes (Signed)
Paged MD regarding patient's uncontrolled pain- received return page; PA will order additional PRN medication.  Will continue to monitor.

## 2017-04-01 NOTE — Progress Notes (Signed)
Rehab admissions - I am following for potential acute inpatient rehab admission.  Will await improved therapy tolerance and participation.  Noted PEG placed today. Call me for questions.  #[phone]

## 2017-04-01 NOTE — Op Note (Signed)
[facility] Patient Name: Carly [name] Procedure Date : 04/01/2017 MRN: [id number] Attending MD: Georganna Skeans , MD Date of Birth: 1971/04/08 CSN: [id number] Age: 46 Admit Type: Inpatient Procedure:                Upper GI endoscopy Indications:              Place PEG because patient is unable to eat due to                            stroke (CVA) Providers:                Georganna Skeans, MD, Brigid Re, PAC, Izora Gala, PAC, Cleda Daub, RN, Marcene Duos,                            Technician Referring MD:             Dr. Kathyrn Sheriff Medicines:                See the Anesthesia note for documentation of the                            administered medications Complications:            No immediate complications. Estimated Blood Loss:     Estimated blood loss: none. Procedure:                Pre-Anesthesia Assessment:                           - Prior to the procedure, a History and Physical                            was performed, and patient medications and                            allergies were reviewed. The patient is unable to                            give consent secondary to the patient's altered                            mental status. The risks and benefits of the                            procedure and the sedation options and risks were                            discussed with the patient's spouse. All questions                            were answered and informed consent was obtained.  Patient identification and proposed procedure were                            verified by the physician, the nurse, the                            anesthetist and the technician in the procedure                            room. Mental Status Examination: lethargic. Airway                            Examination: normal oropharyngeal airway and neck                            mobility. Respiratory Examination:  clear to                            auscultation. CV Examination: regular rate and                            rhythm. ASA Grade Assessment: III - A patient with                            severe systemic disease. After reviewing the risks                            and benefits, the patient was deemed in                            satisfactory condition to undergo the procedure.                            The anesthesia plan was to use deep sedation /                            analgesia. Immediately prior to administration of                            medications, the patient was re-assessed for                            adequacy to receive sedatives. The heart rate,                            respiratory rate, oxygen saturations, blood                            pressure, adequacy of pulmonary ventilation, and                            response to care were monitored throughout the  procedure. The physical status of the patient was                            re-assessed after the procedure.                           After obtaining informed consent, the endoscope was                            passed under direct vision. Throughout the                            procedure, the patient's blood pressure, pulse, and                            oxygen saturations were monitored continuously. The                            was introduced through the mouth, and advanced to                            the second part of duodenum. The patient tolerated                            the procedure fairly well. Scope In: Scope Out: Findings:      No gross lesions were noted in the entire esophagus.      No gross lesions were noted in the stomach. Placement of an externally       removable PEG with no T-fasteners was successfully completed. The       external bumper was at the 5.0 cm marking on the tube. Estimated blood       loss was minimal.      No gross lesions were  noted in the second portion of the duodenum. Impression:               - No gross lesions in esophagus.                           - No gross lesions in the stomach.                           - No gross lesions in the second portion of the                            duodenum.                           - An externally removable PEG placement was                            successfully completed.                           - No specimens collected. Recommendation:           - Please follow the post-PEG recommendations. Procedure Code(s):        ---  Professional ---                           [phone], Esophagogastroduodenoscopy, flexible,                            transoral; with directed placement of percutaneous                            gastrostomy tube Diagnosis Code(s):        --- Professional ---                           P89.842, Other sequelae of cerebral infarction                           Z43.1, Encounter for attention to gastrostomy                           R63.3, Feeding difficulties CPT copyright 2016 [employer]. All rights reserved. The codes documented in this report are preliminary and upon coder review may  be revised to meet current compliance requirements. Georganna Skeans, MD 04/01/2017 8:23:06 AM This report has been signed electronically. Number of Addenda: 0

## 2017-04-01 NOTE — Transfer of Care (Signed)
Immediate Anesthesia Transfer of Care Note  Patient: Carly [name]  Procedure(s) Performed: Procedure(s): ESOPHAGOGASTRODUODENOSCOPY (EGD) (N/A) PERCUTANEOUS ENDOSCOPIC GASTROSTOMY (PEG) PLACEMENT (N/A)  Patient Location: Endoscopy Unit  Anesthesia Type:MAC  Level of Consciousness: awake, alert  and sedated  Airway & Oxygen Therapy: Patient connected to nasal cannula oxygen  Post-op Assessment: Post -op Vital signs reviewed and stable  Post vital signs: stable  Last Vitals:  Vitals:   04/01/17 0352 04/01/17 0641  BP: (!) 103/55 121/66  Pulse: 70 82  Resp: 19 18  Temp: 37.4 C 37.4 C    Last Pain:  Vitals:   04/01/17 0641  TempSrc: Axillary  PainSc: 0-No pain         Complications: No apparent anesthesia complications

## 2017-04-01 NOTE — Anesthesia Postprocedure Evaluation (Signed)
Anesthesia Post Note  Patient: Carly [name]  Procedure(s) Performed: Procedure(s) (LRB): ESOPHAGOGASTRODUODENOSCOPY (EGD) (N/A) PERCUTANEOUS ENDOSCOPIC GASTROSTOMY (PEG) PLACEMENT (N/A)     Patient location during evaluation: PACU Anesthesia Type: MAC Level of consciousness: awake and alert Pain management: pain level controlled Vital Signs Assessment: post-procedure vital signs reviewed and stable Respiratory status: spontaneous breathing, nonlabored ventilation, respiratory function stable and patient connected to nasal cannula oxygen Cardiovascular status: stable and blood pressure returned to baseline Anesthetic complications: no    Last Vitals:  Vitals:   04/01/17 1520 04/01/17 2021  BP:  118/70  Pulse:  95  Resp:  (!) 23  Temp: 37.4 C 37.3 C    Last Pain:  Vitals:   04/01/17 2021  TempSrc: Axillary  PainSc:                  Harvin Konicek DAVID

## 2017-04-01 NOTE — Progress Notes (Signed)
CHG bath given, Pre-procedure list started, consent previously completed, and MRSA swab done.

## 2017-04-01 NOTE — Progress Notes (Signed)
Day of Surgery  Subjective: In Endo  Objective: Vital signs in last 24 hours: Temp:  [99 F (37.2 C)-99.4 F (37.4 C)] 99.4 F (37.4 C) (07/17 0641) Pulse Rate:  [67-90] 82 (07/17 0641) Resp:  [10-21] 18 (07/17 0641) BP: (91-135)/(50-92) 121/66 (07/17 0641) SpO2:  [97 %-100 %] 98 % (07/17 0641) Weight:  [101.1 kg (222 lb 14.2 oz)] 101.1 kg (222 lb 14.2 oz) (07/17 0402) Last BM Date: 03/31/17  Intake/Output from previous day: 07/16 0701 - 07/17 0700 In: 703.8 [I.V.:210.3; NG/GT:273.5; IV Piggyback:220] Out: 675 [Urine:600; Stool:75] Intake/Output this shift: Total I/O In: 230.3 [I.V.:120.3; IV Piggyback:110] Out: 50 [Urine:50]  General appearance: cooperative Resp: clear to auscultation bilaterally Cardio: regular rate and rhythm GI: soft, NT  Lab Results: CBC  No results for input(s): WBC, HGB, HCT, PLT in the last 72 hours. BMET No results for input(s): NA, K, CL, CO2, GLUCOSE, BUN, CREATININE, CALCIUM in the last 72 hours. PT/INR No results for input(s): LABPROT, INR in the last 72 hours. ABG No results for input(s): PHART, HCO3 in the last 72 hours.  Invalid input(s): PCO2, PO2  Studies/Results: No results found.  Anti-infectives: Anti-infectives    Start     Dose/Rate Route Frequency Ordered Stop   04/01/17 0700  ceFAZolin (ANCEF) IVPB 2g/100 mL premix     2 g 200 mL/hr over 30 Minutes Intravenous  Once 04/01/17 0647     03/26/17 1000  cefTRIAXone (ROCEPHIN) 2 g in dextrose 5 % 50 mL IVPB     2 g 100 mL/hr over 30 Minutes Intravenous Every 24 hours 03/26/17 0928 03/30/17 1110   03/10/17 1000  ceFAZolin (ANCEF) IVPB 2g/100 mL premix  Status:  Discontinued     2 g 200 mL/hr over 30 Minutes Intravenous Every 8 hours 03/10/17 0950 03/25/17 0825   03/08/17 1230  linezolid (ZYVOX) IVPB 600 mg  Status:  Discontinued     600 mg 300 mL/hr over 60 Minutes Intravenous Every 12 hours 03/08/17 1144 03/10/17 0917   03/07/17 1000  vancomycin (VANCOCIN) IVPB 750  mg/150 ml premix  Status:  Discontinued     750 mg 150 mL/hr over 60 Minutes Intravenous Every 12 hours 03/07/17 0856 03/08/17 1144   03/07/17 1000  piperacillin-tazobactam (ZOSYN) IVPB 3.375 g  Status:  Discontinued     3.375 g 12.5 mL/hr over 240 Minutes Intravenous Every 8 hours 03/07/17 0856 03/10/17 0917      Assessment/Plan: For PEG placement this AM. Consent signed.  LOS: 29 days    Georganna Skeans, MD, MPH, FACS Trauma: [phone] General Surgery: [phone]  7/17/2018Patient ID: Carly [name], female   DOB: Feb 05, 1971, 46 y.o.   MRN: [id number]

## 2017-04-01 NOTE — Anesthesia Preprocedure Evaluation (Signed)
Anesthesia Evaluation  Patient identified by MRN, date of birth, ID band Patient awake    Reviewed: Allergy & Precautions, NPO status , Patient's Chart, lab work & pertinent test results  Airway Mallampati: I  TM Distance: >3 FB Neck ROM: Full    Dental   Pulmonary    Pulmonary exam normal        Cardiovascular hypertension, Pt. on medications Normal cardiovascular exam     Neuro/Psych SAN with aneuysm. Non verbal but can communicate    GI/Hepatic GERD  Medicated and Controlled,  Endo/Other    Renal/GU      Musculoskeletal   Abdominal   Peds  Hematology   Anesthesia Other Findings   Reproductive/Obstetrics                             Anesthesia Physical Anesthesia Plan  ASA: III  Anesthesia Plan: MAC   Post-op Pain Management:    Induction: Intravenous  PONV Risk Score and Plan: 2 and Ondansetron and Dexamethasone  Airway Management Planned: Simple Face Mask  Additional Equipment:   Intra-op Plan:   Post-operative Plan:   Informed Consent: I have reviewed the patients History and Physical, chart, labs and discussed the procedure including the risks, benefits and alternatives for the proposed anesthesia with the patient or authorized representative who has indicated his/her understanding and acceptance.     Plan Discussed with: CRNA and Surgeon  Anesthesia Plan Comments:         Anesthesia Quick Evaluation

## 2017-04-02 ENCOUNTER — Encounter (HOSPITAL_COMMUNITY): Payer: Self-pay | Admitting: General Surgery

## 2017-04-02 LAB — GLUCOSE, CAPILLARY
GLUCOSE-CAPILLARY: 120 mg/dL — AB (ref 65–99)
GLUCOSE-CAPILLARY: 154 mg/dL — AB (ref 65–99)
Glucose-Capillary: 156 mg/dL — ABNORMAL HIGH (ref 65–99)
Glucose-Capillary: 111 mg/dL — ABNORMAL HIGH (ref 65–99)
Glucose-Capillary: 113 mg/dL — ABNORMAL HIGH (ref 65–99)

## 2017-04-02 MED ORDER — METOCLOPRAMIDE HCL 10 MG/10ML PO SOLN
5.0000 mg | Freq: Every day | ORAL | Status: DC
  Administered 2017-04-02 – 2017-04-09 (×7): 5 mg
  Filled 2017-04-02 (×14): qty 5

## 2017-04-02 MED ORDER — NYSTATIN 100000 UNIT/ML MT SUSP
5.0000 mL | Freq: Four times a day (QID) | OROMUCOSAL | Status: DC
  Administered 2017-04-02 – 2017-04-09 (×25): 500000 [IU] via ORAL
  Filled 2017-04-02 (×24): qty 5

## 2017-04-02 MED ORDER — LEVETIRACETAM 100 MG/ML PO SOLN
1000.0000 mg | Freq: Two times a day (BID) | ORAL | Status: DC
  Administered 2017-04-02 – 2017-04-09 (×14): 1000 mg
  Filled 2017-04-02 (×14): qty 10

## 2017-04-02 NOTE — Plan of Care (Signed)
Problem: Education: Goal: Knowledge of [facility] Education information/materials will improve Outcome: Progressing Slowly   Problem: Health Behavior/Discharge Planning: Goal: Ability to manage health-related needs will improve Outcome: Progressing Slowly   Problem: Physical Regulation: Goal: Will remain free from infection Outcome: Progressing slowly

## 2017-04-02 NOTE — Progress Notes (Signed)
No issues overnight. TF started.  EXAM:  BP 121/77 (BP Location: Left Wrist)   Pulse 95   Temp 99 F (37.2 C) (Oral)   Resp (!) 21   Ht 5\' 7"  (1.702 m)   Wt 103.1 kg (227 lb 4.7 oz)   LMP  (LMP Unknown) Comment: pt is unresponsive, and is unable to communicate  SpO2 95%   BMI 35.60 kg/m   Easily arousable Can mouth some words CN grossly intact FC all extremities, very weak proximal strength  IMPRESSION:  46 y.o. female s/p SAH with generalized deconditioning. Appears to be ready for next phase of care, hopefully CIR.  PLAN: - Dispo planning, ?CIR

## 2017-04-02 NOTE — Care Management Note (Signed)
Case Management Note Original Note Created by Ellan Lambert  Patient Details  Name: Carly [name] MRN: [id number] Date of Birth: Feb 04, 1971  Subjective/Objective:  Pt admitted on 03/03/17 with AMS and ICH.  PTA, pt independent, lives with spouse.                    Action/Plan: Pt currently remains sedated and intubated.  Will follow for discharge planning as pt progresses.    Expected Discharge Date:                  Expected Discharge Plan:  IP Rehab Facility  In-House Referral:  Clinical Social Work  Discharge planning Services  CM Consult  Post Acute Care Choice:    Choice offered to:     DME Arranged:    DME Agency:     HH Arranged:    [place] Agency:     Status of Service:  In process, will continue to follow  If discussed at [place] of Stay Meetings, dates discussed:   04/02/2017 Pt transferred to [place] recommended - CSW consulted for back up plan - pt not yet tolerating therapy parameters for CIR  Additional Comments:  03/25/17 J. Amerson, RN, BSN Family gathered at bedside for extubation.  Husband has chosen not to be present.    03/31/17 J. Amerson, RN, BSN Pt now awake and following commands.  Plan PEG placement tomorrow, per surgical team.  Pt has orders for transfer to SDU.   EVD discontinued.  Will continue to follow progress.    Reinaldo Raddle, RN, BSN  Trauma/Neuro ICU Case Manager [phone]

## 2017-04-02 NOTE — Progress Notes (Signed)
[facility] Progress Note  1 Day Post-Op  Subjective: CC: No complaints Denies abdominal pain, n/v. Tolerated TF yesterday, were held after MN, unsure why.  Objective: Vital signs in last 24 hours: Temp:  [98.4 F (36.9 C)-99.4 F (37.4 C)] 98.7 F (37.1 C) (07/18 0341) Pulse Rate:  [95-98] 98 (07/18 0341) Resp:  [15-24] 24 (07/18 0341) BP: (101-118)/(60-81) 106/60 (07/18 0341) SpO2:  [91 %-99 %] 91 % (07/18 0341) Weight:  [103.1 kg (227 lb 4.7 oz)] 103.1 kg (227 lb 4.7 oz) (07/18 0351) Last BM Date: 04/01/17  Intake/Output from previous day: 07/17 0701 - 07/18 0700 In: 770 [I.V.:420; IV Piggyback:110] Out: 576 [Urine:576] Intake/Output this shift: No intake/output data recorded.  PE: Gen:  Lethargic, NAD, pleasant Card:  Regular rate and rhythm Pulm:  Normal effort, clear to auscultation bilaterally Abd: Soft, non-tender, non-distended, bowel sounds present, no HSM, incisions C/D/I. PEG in place with minimal drainage around. Binder not present.  Skin: warm and dry, no rashes  Neuro: Follows commands. GCS eye 4, verbal 1, motor 6.  Psych: flat affect   CMP     Component Value Date/Time   NA 138 03/27/2017 0547   NA 143 02/25/2017 1352   K 3.6 03/27/2017 0547   CL 104 03/27/2017 0547   CO2 28 03/27/2017 0547   GLUCOSE 117 (H) 03/27/2017 0547   BUN 11 03/27/2017 0547   BUN 9 02/25/2017 1352   CREATININE 0.70 03/27/2017 0547   CALCIUM 8.5 (L) 03/27/2017 0547   PROT 5.3 (L) 03/09/2017 0453   PROT 6.7 02/25/2017 1352   ALBUMIN 2.2 (L) 03/23/2017 0557   ALBUMIN 4.1 02/25/2017 1352   AST 102 (H) 03/09/2017 0453   ALT 57 (H) 03/09/2017 0453   ALKPHOS 86 03/09/2017 0453   BILITOT 1.1 03/09/2017 0453   BILITOT 0.2 02/25/2017 1352   GFRNONAA >60 03/27/2017 0547   GFRAA >60 03/27/2017 0547    Anti-infectives: Anti-infectives    Start     Dose/Rate Route Frequency Ordered Stop   04/01/17 0700  ceFAZolin (ANCEF) IVPB 2g/100 mL premix     2 g 200  mL/hr over 30 Minutes Intravenous  Once 04/01/17 0647 04/01/17 0800   03/26/17 1000  cefTRIAXone (ROCEPHIN) 2 g in dextrose 5 % 50 mL IVPB     2 g 100 mL/hr over 30 Minutes Intravenous Every 24 hours 03/26/17 0928 03/30/17 1110   03/10/17 1000  ceFAZolin (ANCEF) IVPB 2g/100 mL premix  Status:  Discontinued     2 g 200 mL/hr over 30 Minutes Intravenous Every 8 hours 03/10/17 0950 03/25/17 0825   03/08/17 1230  linezolid (ZYVOX) IVPB 600 mg  Status:  Discontinued     600 mg 300 mL/hr over 60 Minutes Intravenous Every 12 hours 03/08/17 1144 03/10/17 0917   03/07/17 1000  vancomycin (VANCOCIN) IVPB 750 mg/150 ml premix  Status:  Discontinued     750 mg 150 mL/hr over 60 Minutes Intravenous Every 12 hours 03/07/17 0856 03/08/17 1144   03/07/17 1000  piperacillin-tazobactam (ZOSYN) IVPB 3.375 g  Status:  Discontinued     3.375 g 12.5 mL/hr over 240 Minutes Intravenous Every 8 hours 03/07/17 0856 03/10/17 0917       Assessment/Plan Dysphagia S/P PEG Placement 04/01/17 Dr. Grandville Silos - tolerated TF yesterday - from our standpoint, can continue TF unless held by primary teams - abdominal binder  S/P coiling of L. ICA for aneurysmal bleeding with significant deficits  FEN - TF at 60 mL/hr VTE -  SCDs ID - ancef at time of PEG placement  Plan: continue TF. Possible CIR    LOS: 30 days    Brigid Re , [facility] 04/02/2017, 8:06 AM Pager: [phone] Trauma Pager: [phone] Mon-Fri 7:00 am-4:30 pm Sat-Sun 7:00 am-11:30 am

## 2017-04-02 NOTE — Progress Notes (Signed)
Physical Therapy Treatment Patient Details Name: Carly [name] MRN: [id number] DOB: [birth year] Today's Date: 04/02/2017    History of Present Illness Pt is a 46 y/o female with PMH of HTN and migraines. She was admitted on 03/03/2017 with headache and focal seizure activity. CT scan of the head showed subarachnoid hemorrhage with associated IVH in the setting of presumed ruptured aneurysm. Patient developed progressive decrease in level of consciousness, vomited, postured. She was intubated for airway protection and IVC placed per neurosurgery. 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 Positive for active extravasation of hemorrhage. Patient underwent embolization on 03/07/2017.     PT Comments    Pt admitted with above diagnosis. Pt currently with functional limitations due to the deficits listed below (see PT Problem List). Pt was able to sit EOB 12 min with min guard assist and cues to right self on her own.  Pt did so well also performed scoot pivot into recliner with +2 max assist with use of pad with pt demonstrating ability to place weight on bil LEs for transfer.  Progressing.  Pt will benefit from skilled PT to increase their independence and safety with mobility to allow discharge to the venue listed below.     Follow Up Recommendations  CIR     Equipment Recommendations  Wheelchair (measurements PT);Wheelchair cushion (measurements PT)    Recommendations for Other Services Rehab consult     Precautions / Restrictions Precautions Precautions: Fall Precaution Comments: ventric and foley now d/c Restrictions Weight Bearing Restrictions: No    Mobility  Bed Mobility Overal bed mobility: Needs Assistance Bed Mobility: Supine to Sit;Rolling Rolling: Max assist;+2 for physical assistance;+2 for safety/equipment   Supine to sit: Max assist;+2 for  physical assistance;+2 for safety/equipment     General bed mobility comments: Pt initiates LE movement and elevation of trunk but needed some assist to complete movement.   Transfers Overall transfer level: Needs assistance   Transfers: Lateral/Scoot Transfers          Lateral/Scoot Transfers: Max assist;+2 physical assistance;From elevated surface General transfer comment: Used pad and gait belt and pt performed scoot pivot to chair with weight bearing on Bil LEs with max assist of 2 persons assist.    Ambulation/Gait             General Gait Details: unable   Stairs            Wheelchair Mobility    Modified Rankin (Stroke Patients Only) Modified Rankin (Stroke Patients Only) Pre-Morbid Rankin Score: No symptoms Modified Rankin: Severe disability     Balance Overall balance assessment: Needs assistance Sitting-balance support: Feet unsupported;Bilateral upper extremity supported Sitting balance-Leahy Scale: Poor Sitting balance - Comments: Pt was able to sit with varying assist mod to min guard assist.  Sat min guard assist with cues for posture for up to 12 minutes at a time with cues only with pt righting her balance on her own.   Postural control: Posterior lean;Left lateral lean                                  Cognition Arousal/Alertness: Awake/alert Behavior During Therapy: Flat affect Overall Cognitive Status: Impaired/Different from baseline Area of Impairment: Following commands;Attention;Problem solving  Current Attention Level: Focused   Following Commands: Follows one step commands with increased time;Follows one step commands inconsistently     Problem Solving: Slow processing;Decreased initiation;Difficulty sequencing;Requires verbal cues;Requires tactile cues General Comments: pt non verbal and unable to answer questions, pt shook head yes/no to simple questions but not consistently       Exercises General Exercises - Lower Extremity Long Arc Quad: AROM;Both;5 reps;Seated    General Comments General comments (skin integrity, edema, etc.): Nurse asked PT to place abdominal binder on but it would not fit with pt in sitting.       Pertinent Vitals/Pain Pain Assessment: Faces Faces Pain Scale: Hurts little more Pain Location: abdomen where PEG in place Pain Descriptors / Indicators: Aching;Grimacing;Guarding;Discomfort Pain Intervention(s): Limited activity within patient's tolerance;Monitored during session;Repositioned  101-110 bpm, 94% on RA, 115/69.    Home Living                      Prior Function            PT Goals (current goals can now be found in the care plan section) Progress towards PT goals: Progressing toward goals    Frequency    Min 3X/week      PT Plan Current plan remains appropriate    Co-evaluation              AM-PAC PT "6 Clicks" Daily Activity  Outcome Measure  Difficulty turning over in bed (including adjusting bedclothes, sheets and blankets)?: Total Difficulty moving from lying on back to sitting on the side of the bed? : Total Difficulty sitting down on and standing up from a chair with arms (e.g., wheelchair, bedside commode, etc,.)?: Total Help needed moving to and from a bed to chair (including a wheelchair)?: Total Help needed walking in hospital room?: Total Help needed climbing 3-5 steps with a railing? : Total 6 Click Score: 6    End of Session Equipment Utilized During Treatment: Gait belt Activity Tolerance: Patient tolerated treatment well Patient left: with call bell/phone within reach;in chair;with chair alarm set Nurse Communication: Mobility status;Need for lift equipment PT Visit Diagnosis: Muscle weakness (generalized) (M62.81);Difficulty in walking, not elsewhere classified (R26.2)     Time: 4235-3614 PT Time Calculation (min) (ACUTE ONLY): 33 min  Charges:  $Therapeutic Activity:  23-37 mins                    G Codes:       Purnell Daigle,PT Acute Rehabilitation [phone] (pager)    Denice Paradise 04/02/2017, 11:56 AM

## 2017-04-03 LAB — GLUCOSE, CAPILLARY
GLUCOSE-CAPILLARY: 141 mg/dL — AB (ref 65–99)
GLUCOSE-CAPILLARY: 165 mg/dL — AB (ref 65–99)
GLUCOSE-CAPILLARY: 88 mg/dL (ref 65–99)
Glucose-Capillary: 105 mg/dL — ABNORMAL HIGH (ref 65–99)
Glucose-Capillary: 134 mg/dL — ABNORMAL HIGH (ref 65–99)
Glucose-Capillary: 95 mg/dL (ref 65–99)

## 2017-04-03 MED ORDER — FLUCONAZOLE IN SODIUM CHLORIDE 200-0.9 MG/100ML-% IV SOLN
150.0000 mg | Freq: Once | INTRAVENOUS | Status: AC
  Administered 2017-04-03: 150 mg via INTRAVENOUS
  Filled 2017-04-03: qty 75

## 2017-04-03 NOTE — Progress Notes (Signed)
OT Cancellation Note  Patient Details Name: Carly [name] MRN: [id number] DOB: 1971-05-17   Cancelled Treatment:    Reason Eval/Treat Not Completed: Medical issues which prohibited therapy. Pt just vomited. Nursing cleaning up. Will try back.  Malka So 04/03/2017, 8:52 AM  [phone]

## 2017-04-03 NOTE — Evaluation (Signed)
Occupational Therapy Evaluation Patient Details Name: Carly [name] MRN: [id number] DOB: 26-Jan-1971 Today's Date: 04/03/2017    History of Present Illness Pt is a 46 y/o female with PMH of HTN and migraines. She was admitted on 03/03/2017 with headache and focal seizure activity. CT scan of the head showed subarachnoid hemorrhage with associated IVH in the setting of presumed ruptured aneurysm. Patient developed progressive decrease in level of consciousness, vomited, postured. She was intubated for airway protection and IVC placed per neurosurgery. 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 Positive for active extravasation of hemorrhage. Patient underwent embolization on 03/07/2017.    Clinical Impression   Pt was independent prior to admission. She presents with generalized weakness with L UE weaker than R, decreased activity tolerance, R gaze and impaired cognition. She requires total assist for ADL and 2 person assist for bed level mobility and demonstrates decreased sitting balance which was limited by abdominal pain. Pt was able to nod her head and speak with one or two words, but volume is low. Pt will need extensive rehab upon discharge. Will follow acutely.    Follow Up Recommendations  CIR    Equipment Recommendations  3 in 1 bedside commode;Wheelchair (measurements OT);Wheelchair cushion (measurements OT);Hospital bed;Other (comment) (hoyer lift)    Recommendations for Other Services       Precautions / Restrictions Precautions Precautions: Fall Precaution Comments: flexiseal, PEG  Restrictions Weight Bearing Restrictions: No      Mobility Bed Mobility Overal bed mobility: Needs Assistance Bed Mobility: Rolling;Supine to Sit;Sit to Supine Rolling: Total assist   Supine to sit: HOB elevated;Max assist;+2 for physical assistance Sit to  supine: Total assist;+2 for physical assistance   General bed mobility comments: initiates movement of R LE to EOB, L LE flexes when attempts to move to EOB, pt attempts to elevate trunk, but needs max assist, max assist to shift hips to EOB with pad, assist for all aspects returning to supine  Transfers                 General transfer comment: did not tolerate upright due to abdominal discomfort, session limited to EOB    Balance Overall balance assessment: Needs assistance Sitting-balance support: Feet unsupported;Bilateral upper extremity supported Sitting balance-Leahy Scale: Poor Sitting balance - Comments: flexed posture, mod to min assist Postural control: Posterior lean;Left lateral lean                                 ADL either performed or assessed with clinical judgement   ADL                                         General ADL Comments: Pt currently requires total assist.     Vision Baseline Vision/History: No visual deficits Patient Visual Report: Diplopia Vision Assessment?: Yes Eye Alignment: Impaired (comment) Ocular Range of Motion: Restricted on the left Alignment/Gaze Preference: Gaze right Tracking/Visual Pursuits: Right eye does not track medially;Left eye does not track laterally     Perception     Praxis      Pertinent Vitals/Pain Pain Assessment: Faces Faces Pain Scale: Hurts whole lot Pain Location: abdomen Pain Descriptors / Indicators: Aching;Grimacing;Guarding;Discomfort Pain Intervention(s): Monitored during session;Repositioned  Hand Dominance Right   Extremity/Trunk Assessment Upper Extremity Assessment Upper Extremity Assessment: RUE deficits/detail;LUE deficits/detail RUE Deficits / Details: 3/5 shoulder, elbow, 4-/5 gross graspt RUE Coordination: decreased fine motor;decreased gross motor LUE Deficits / Details: 3-/5 shoulder, elbow to hand 3/5 LUE Coordination: decreased fine  motor;decreased gross motor   Lower Extremity Assessment Lower Extremity Assessment: Defer to PT evaluation   Cervical / Trunk Assessment Cervical / Trunk Assessment: Normal   Communication Communication Communication: Expressive difficulties (pt is able to talk, but requires prompting and time, nods )   Cognition Arousal/Alertness: Awake/alert Behavior During Therapy: Flat affect Overall Cognitive Status: Impaired/Different from baseline Area of Impairment: Following commands;Attention;Problem solving                   Current Attention Level: Sustained   Following Commands: Follows one step commands with increased time;Follows one step commands inconsistently     Problem Solving: Slow processing;Decreased initiation;Difficulty sequencing;Requires verbal cues;Requires tactile cues General Comments: pt smiling at times appropriately   General Comments       Exercises     Shoulder Instructions      Home Living Family/patient expects to be discharged to:: Private residence Living Arrangements: Spouse/significant other                               Additional Comments: no family present, pt unable to communicate PLOF      Prior Functioning/Environment Level of Independence: Independent        Comments: per pt report she was indpendent        OT Problem List: Decreased strength;Decreased activity tolerance;Impaired balance (sitting and/or standing);Impaired vision/perception;Decreased coordination;Decreased cognition;Decreased knowledge of use of DME or AE;Obesity;Pain;Impaired UE functional use      OT Treatment/Interventions: Self-care/ADL training;Therapeutic exercise;Therapeutic activities;Cognitive remediation/compensation;Visual/perceptual remediation/compensation;Patient/family education;Balance training    OT Goals(Current goals can be found in the care plan section) Acute Rehab OT Goals Patient Stated Goal: didn't state OT Goal  Formulation: Patient unable to participate in goal setting Time For Goal Achievement: 04/17/17 Potential to Achieve Goals: Fair ADL Goals Pt Will Perform Grooming: with mod assist;sitting (oral care, washing face and hands) Pt/caregiver will Perform Home Exercise Program: Both right and left upper extremity;With minimal assist (AROM) Additional ADL Goal #1: Pt will perform bed mobility with moderate assistance in prepartaion for ADL. Additional ADL Goal #2: Pt will sit EOB with min assist x 10 minutes. Additional ADL Goal #3: Pt will locate visual targets on L using head turn with moderate verbal cues.  OT Frequency: Min 2X/week   Barriers to D/C:            Co-evaluation              AM-PAC PT "6 Clicks" Daily Activity     Outcome Measure Help from another person eating meals?: Total Help from another person taking care of personal grooming?: Total Help from another person toileting, which includes using toliet, bedpan, or urinal?: Total Help from another person bathing (including washing, rinsing, drying)?: Total Help from another person to put on and taking off regular upper body clothing?: Total Help from another person to put on and taking off regular lower body clothing?: Total 6 Click Score: 6   End of Session Nurse Communication: Other (comment) (abdominal pain)  Activity Tolerance: Patient limited by pain;Patient limited by fatigue Patient left: in bed;with call bell/phone within reach  OT Visit Diagnosis: Muscle weakness (generalized) (M62.81);Pain;Cognitive  communication deficit (R41.841);Other symptoms and signs involving cognitive function                Time: 9276-3943 OT Time Calculation (min): 20 min Charges:  OT General Charges $OT Visit: 1 Procedure OT Evaluation $OT Eval High Complexity: 1 Procedure G-Codes:      Malka So 04/03/2017, 12:20 PM  [phone]

## 2017-04-03 NOTE — Progress Notes (Signed)
Rehab admissions - I have opened the case with [facility] for patient requesting acute inpatient rehab admission.  OT evaluation and recommendations are pending.  I have called and left message for spouse, but no return call yet.  I will continue to follow.  Currently rehab beds are full today.  Call me for questions.  #[phone]

## 2017-04-03 NOTE — Progress Notes (Addendum)
Pt seen and examined.  No issues overnight. Nursing reports yeast infection  EXAM: Temp:  [99 F (37.2 C)-99.9 F (37.7 C)] 99.9 F (37.7 C) (07/19 0455) Pulse Rate:  [95-103] 101 (07/19 0455) Resp:  [21-25] 21 (07/19 0455) BP: (107-135)/(74-85) 107/75 (07/19 0455) SpO2:  [94 %-99 %] 99 % (07/19 0455) Weight:  [99.5 kg (219 lb 5.7 oz)] 99.5 kg (219 lb 5.7 oz) (07/19 0455) Intake/Output      07/18 0701 - 07/19 0700 07/19 0701 - 07/20 0700   P.O. 0    I.V. (mL/kg) 84.3 (0.8)    NG/GT 1132    IV Piggyback 110    Total Intake(mL/kg) 1326.3 (13.3)    Urine (mL/kg/hr) 650 (0.3)    Stool 245    Total Output 895     Net +431.3           Easily arousable Reportedly mouthing words but only nodding for me today CN grossly intact Moves all extremities but extremely weak  Plan 46 y.o. female s/p SAH with generalized deconditioning. Appears to be ready for next phase of care, hopefully CIR. Dispo planning Tx yeast infection with Diflucan

## 2017-04-03 NOTE — Care Management Note (Signed)
Case Management Note Original Note Created by Ellan Lambert  Patient Details  Name: Carly [name] MRN: [id number] Date of Birth: [birth year]  Subjective/Objective:  Pt admitted on 03/03/17 with AMS and ICH.  PTA, pt independent, lives with spouse.                    Action/Plan: Pt currently remains sedated and intubated.  Will follow for discharge planning as pt progresses.    Expected Discharge Date:                  Expected Discharge Plan:  IP Rehab Facility  In-House Referral:  Clinical Social Work  Discharge planning Services  CM Consult  Post Acute Care Choice:    Choice offered to:     DME Arranged:    DME Agency:     HH Arranged:    [place] Agency:     Status of Service:  In process, will continue to follow  If discussed at [place] of Stay Meetings, dates discussed:   04/03/2017  Discussed in LOS 7/19 - pt will need to discharge to either CIR or SNF when medically stable.  Pt vomited earlier this am and tube feeds had to be stopped - CIR likely will not have bed for pt and therefore CSW informed today that pt will most likely need SNF bed probable discharge 7/20 pending medical stability  04/02/17 Pt transferred to [place].  CIR recommended - CSW consulted for back up plan - pt not yet tolerating therapy parameters for CIR  Additional Comments:  03/25/17 J. Amerson, RN, BSN Family gathered at bedside for extubation.  Husband has chosen not to be present.    03/31/17 J. Amerson, RN, BSN Pt now awake and following commands.  Plan PEG placement tomorrow, per surgical team.  Pt has orders for transfer to SDU.   EVD discontinued.  Will continue to follow progress.    Reinaldo Raddle, RN, BSN  Trauma/Neuro ICU Case Manager [phone]

## 2017-04-03 NOTE — Progress Notes (Signed)
Pt. vomitted large amount of tube feeding after mouth care being done. Notified Ferne Reus PA regarding incident. Will stop tube feeds x2hours and restart tube feeds at 20cc/hr and increase by 10cc/hr q4hours.

## 2017-04-03 NOTE — Progress Notes (Signed)
  Speech Language Pathology Treatment: Cognitive-Linquistic  Patient Details Name: Carly [name] MRN: [id number] DOB: February 28, 1971 Today's Date: 04/03/2017 Time: 1330-1350 SLP Time Calculation (min) (ACUTE ONLY): 20 min  Assessment / Plan / Recommendation Clinical Impression  Pt seen at bedside for communication/cognitive treatment. Dysphagia goals not addressed at this time, due to pt vomiting tube feeds earlier today. Pt with significant oral thrush. Pt nonvocal today. Delayed response time, however, yes/no responses to simple and biographical questions appear to be accurate. Pt able to follow verbal directions with encouragement and extended processing time. Pt was encouraged to participate in therapy, given 3 hour/day requirement on CIR. ST will continue to follow acutely. Recommend continued ST intervention at next level of care.   HPI HPI: 46 y.o. female  with past medical history of Hypertension, GERD, migraine headaches admitted on 03/03/2017 with headache and focal seizure activity. CT scan of the head showed subarachnoid hemorrhage with associated IVH in the setting of presumed ruptured aneurysm. Patient developed progressive decrease in level of consciousness, vomited, postured. She was intubated for airway protection and IVC placed per neurosurgery. 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 Positive for active extravasation of hemorrhage. Patient underwent embolization on 03/07/2017.       SLP Plan  Continue with current plan of care       Recommendations  Diet recommendations: NPO Medication Administration: Via alternative means                General recommendations: Rehab consult Oral Care Recommendations: Oral care QID Follow up Recommendations: Inpatient Rehab;Skilled Nursing facility SLP Visit Diagnosis: Aphasia (R47.01);Cognitive  communication deficit (R41.841);Dysphagia, unspecified (R13.10) Plan: Continue with current plan of care       [place]. [place], [facility], [place]   Shonna Chock 04/03/2017, 2:04 PM

## 2017-04-04 LAB — GLUCOSE, CAPILLARY
GLUCOSE-CAPILLARY: 127 mg/dL — AB (ref 65–99)
GLUCOSE-CAPILLARY: 160 mg/dL — AB (ref 65–99)
Glucose-Capillary: 121 mg/dL — ABNORMAL HIGH (ref 65–99)
Glucose-Capillary: 122 mg/dL — ABNORMAL HIGH (ref 65–99)
Glucose-Capillary: 123 mg/dL — ABNORMAL HIGH (ref 65–99)
Glucose-Capillary: 136 mg/dL — ABNORMAL HIGH (ref 65–99)

## 2017-04-04 MED ORDER — CHLORHEXIDINE GLUCONATE 0.12 % MT SOLN
15.0000 mL | Freq: Two times a day (BID) | OROMUCOSAL | Status: DC
  Administered 2017-04-04 – 2017-04-09 (×10): 15 mL via OROMUCOSAL
  Filled 2017-04-04 (×10): qty 15

## 2017-04-04 MED ORDER — ORAL CARE MOUTH RINSE
15.0000 mL | Freq: Two times a day (BID) | OROMUCOSAL | Status: DC
  Administered 2017-04-04 – 2017-04-09 (×7): 15 mL via OROMUCOSAL

## 2017-04-04 NOTE — Progress Notes (Signed)
Rehab admissions - I placed telephone calls to spouse yesterday, but no response.  I do have authorization for CIR admit, but I have no beds on rehab and I have no family to discuss potential inpatient rehab admission.  I will follow up on Monday.  Call me for questions.  #[phone]

## 2017-04-04 NOTE — Progress Notes (Signed)
Physical Therapy Treatment Patient Details Name: Carly [name] MRN: [id number] DOB: 04-Nov-1970 Today's Date: 04/04/2017    History of Present Illness Pt is a 46 y/o female with PMH of HTN and migraines. She was admitted on 03/03/2017 with headache and focal seizure activity. CT scan of the head showed subarachnoid hemorrhage with associated IVH in the setting of presumed ruptured aneurysm. Patient developed progressive decrease in level of consciousness, vomited, postured. She was intubated for airway protection and IVC placed per neurosurgery. 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 Positive for active extravasation of hemorrhage. Patient underwent embolization on 03/07/2017.     PT Comments    Pt admitted with above diagnosis. Pt currently with functional limitations due to balance and endurance deficits. Pt was able to sit EOB with min to min guard assist 8 minutes. Then transferred to recliner with +2 mod to max assist squat pivot with use of pad with pt able to weight bear bilaterally.  Progressing well and ready for REhab.   Pt will benefit from skilled PT to increase their independence and safety with mobility to allow discharge to the venue listed below.     Follow Up Recommendations  CIR     Equipment Recommendations  Wheelchair (measurements PT);Wheelchair cushion (measurements PT)    Recommendations for Other Services Rehab consult     Precautions / Restrictions Precautions Precautions: Fall Precaution Comments: PEG  Restrictions Weight Bearing Restrictions: No    Mobility  Bed Mobility Overal bed mobility: Needs Assistance Bed Mobility: Rolling;Supine to Sit;Sit to Supine Rolling: Max assist   Supine to sit: HOB elevated;Mod assist     General bed mobility comments: initiates movement of bil LE to EOB, pt attempts to elevate trunk, but  needs mod assist, min to mod assist to shift hips to EOB with pad  Transfers Overall transfer level: Needs assistance   Transfers: Squat Pivot Transfers     Squat pivot transfers: Max assist;Mod assist;+2 physical assistance     General transfer comment: Pt was able to squat pivot with +2 assist with pad with pt bearing weight on bil LEs for transfer to chair.    Ambulation/Gait             General Gait Details: unable   Stairs            Wheelchair Mobility    Modified Rankin (Stroke Patients Only) Modified Rankin (Stroke Patients Only) Pre-Morbid Rankin Score: No symptoms Modified Rankin: Severe disability     Balance Overall balance assessment: Needs assistance Sitting-balance support: Feet supported;Bilateral upper extremity supported Sitting balance-Leahy Scale: Poor Sitting balance - Comments: varying assist at min to min guard assist sitting up to 8 min utes at EOB.  Pt sleepy and needed constant cues to stay awake.  Fatigues quickly.  Postural control: Posterior lean;Left lateral lean                                  Cognition Arousal/Alertness: Awake/alert Behavior During Therapy: Flat affect Overall Cognitive Status: Impaired/Different from baseline Area of Impairment: Following commands;Attention;Problem solving                   Current Attention Level: Sustained   Following Commands: Follows one step commands with increased time;Follows one step commands inconsistently     Problem Solving:  Slow processing;Decreased initiation;Difficulty sequencing;Requires verbal cues;Requires tactile cues General Comments: pt smiling at times appropriately      Exercises General Exercises - Lower Extremity Ankle Circles/Pumps: AROM;Both;10 reps;Seated Long Arc Quad: AROM;Both;5 reps;Seated    General Comments        Pertinent Vitals/Pain Pain Assessment: Faces Faces Pain Scale: Hurts even more Pain Location: abdomen Pain  Descriptors / Indicators: Aching;Grimacing;Guarding;Discomfort Pain Intervention(s): Limited activity within patient's tolerance;Monitored during session;Repositioned    Home Living                      Prior Function            PT Goals (current goals can now be found in the care plan section) Progress towards PT goals: Progressing toward goals    Frequency    Min 3X/week      PT Plan Current plan remains appropriate    Co-evaluation              AM-PAC PT "6 Clicks" Daily Activity  Outcome Measure  Difficulty turning over in bed (including adjusting bedclothes, sheets and blankets)?: Total Difficulty moving from lying on back to sitting on the side of the bed? : A Lot Difficulty sitting down on and standing up from a chair with arms (e.g., wheelchair, bedside commode, etc,.)?: A Lot Help needed moving to and from a bed to chair (including a wheelchair)?: A Lot Help needed walking in hospital room?: Total Help needed climbing 3-5 steps with a railing? : Total 6 Click Score: 9    End of Session Equipment Utilized During Treatment: Gait belt Activity Tolerance: Patient tolerated treatment well Patient left: with call bell/phone within reach;in chair;with chair alarm set;with family/visitor present Nurse Communication: Mobility status;Need for lift equipment PT Visit Diagnosis: Muscle weakness (generalized) (M62.81);Difficulty in walking, not elsewhere classified (R26.2)     Time: 0981-1914 PT Time Calculation (min) (ACUTE ONLY): 26 min  Charges:  $Therapeutic Exercise: 8-22 mins $Therapeutic Activity: 8-22 mins                    G Codes:       Carly [name],PT Acute Rehabilitation [phone] (pager)    Carly [name] 04/04/2017, 4:31 PM

## 2017-04-04 NOTE — Progress Notes (Signed)
Pt seen and examined.  No issues overnight. More vocal this morning, answering simple questions  EXAM: Temp:  [99.1 F (37.3 C)-100 F (37.8 C)] 99.6 F (37.6 C) (07/20 0832) Pulse Rate:  [87-99] 89 (07/20 0832) Resp:  [21-27] 23 (07/20 0832) BP: (118-146)/(65-80) 123/75 (07/20 0832) SpO2:  [95 %-100 %] 95 % (07/20 0832) Weight:  [99.7 kg (219 lb 12.8 oz)] 99.7 kg (219 lb 12.8 oz) (07/20 0630) Intake/Output      07/19 0701 - 07/20 0700 07/20 0701 - 07/21 0700   NG/GT 620 449.7   Total Intake(mL/kg) 620 (6.2) 449.7 (4.5)   Urine (mL/kg/hr) 1250 (0.5) 50 (0.2)   Total Output 1250 50   Net -630 +399.7         Easily arousable Answering basic questions today CN grossly intact Moves all extremities but extremely weak  Plan 46 y.o.females/p SAH with generalized deconditioning.  Appears to be ready for next phase of care, hopefully CIR.  Dispo planning

## 2017-04-04 NOTE — NC FL2 (Signed)
[place] MEDICAID FL2 LEVEL OF CARE SCREENING TOOL     IDENTIFICATION  Patient Name: Carly [name] Birthdate: [birth year] Sex: female Admission Date (Current Location): 03/03/2017  [facility] and [place] Number:  Herbalist and Address:  [place]. [facility], [place] [address], [place], [place] [address]      Provider Number: [id number]  Attending Physician Name and Address:  Consuella Lose, MD  Relative Name and Phone Number:       Current Level of Care: Hospital Recommended Level of Care: [place] Prior Approval Number:    Date Approved/Denied:   PASRR Number: [id number]  Discharge Plan: SNF    Current Diagnoses: Patient Active Problem List   Diagnosis Date Noted  . Ventilator dependent ([place])   . Palliative care by specialist   . Electrolyte imbalance 03/19/2017  . History of ETT   . Encounter for intubation   . Subarachnoid hemorrhage ([place])   . Acute respiratory failure ([place])   . Ruptured cerebral aneurysm ([place]) 03/03/2017  . Routine general medical examination at a health care facility 08/23/2013  . Essential hypertension, benign 08/23/2013  . Impaired fasting glucose 08/23/2013  . Plantar fasciitis, bilateral 08/23/2013  . Unspecified vitamin D deficiency 08/23/2013  . Obesity, unspecified 08/23/2013  . Unspecified essential hypertension 12/31/2012  . GERD (gastroesophageal reflux disease) 12/31/2012  . COMMON MIGRAINE 06/16/2008  . CHEST PAIN, INTERMITTENT 06/16/2008    Orientation RESPIRATION BLADDER Height & Weight     Self  Normal External catheter, Incontinent Weight: 219 lb 12.8 oz (99.7 kg) Height:  5\' 7"  (170.2 cm)  BEHAVIORAL SYMPTOMS/MOOD NEUROLOGICAL BOWEL NUTRITION STATUS      Incontinent Feeding tube (PEG-osmalite 1.2 at 60cc/hour)  AMBULATORY STATUS COMMUNICATION OF NEEDS Skin   Extensive Assist Does not communicate (possibly due to intubation) Normal                       Personal Care  Assistance Level of Assistance  Bathing, Feeding, Dressing Bathing Assistance: Maximum assistance Feeding assistance: Maximum assistance Dressing Assistance: Maximum assistance     Functional Limitations Info  Sight, Hearing, Speech Sight Info: Adequate Hearing Info: Adequate Speech Info: Impaired ([place])    SPECIAL CARE FACTORS FREQUENCY  PT (By licensed PT), OT (By licensed OT)     PT Frequency: 3x OT Frequency: 2x            Contractures Contractures Info: Not present    Additional Factors Info  Code Status, Allergies Code Status Info: DNR Allergies Info: No known allergies           Current Medications (04/04/2017):  This is the current hospital active medication list Current Facility-Administered Medications  Medication Dose Route Frequency Provider Last Rate Last Dose  .  stroke: mapping our early stages of recovery book   Does not apply Once Vinie Sill C, NP      . 5.40 % sodium chloride infusion   Intravenous PRN Chesley Mires, MD 10 mL/hr at 03/31/17 1600    . acetaminophen (TYLENOL) solution 650 mg  650 mg Per Tube G8Q PRN Pershing Proud, NP   761 mg at 04/01/17 1851  . acetaminophen (TYLENOL) suppository 650 mg  650 mg Rectal Q4H PRN Traci Sermon, PA-C   650 mg at 03/20/17 2303  . chlorhexidine (PERIDEX) 0.12 % solution 15 mL  15 mL Mouth Rinse BID Consuella Lose, MD      . feeding supplement (OSMOLITE 1.2 CAL)  liquid 1,000 mL  1,000 mL Per Tube Continuous Rayburn, Kelly A, PA-C 60 mL/hr at 04/04/17 0832 1,000 mL at 04/04/17 0832  . feeding supplement (PRO-STAT SUGAR FREE 64) liquid 30 mL  30 mL Per Tube Daily Consuella Lose, MD   30 mL at 04/04/17 0943  . HYDROcodone-acetaminophen (HYCET) 7.5-325 mg/15 ml solution 10 mL  10 mL Per Tube Q6H PRN Izora Gala A, PA-C   10 mL at 04/03/17 1517  . labetalol (NORMODYNE,TRANDATE) injection 10 mg  10 mg Intravenous D6L PRN Pershing Proud, NP      . levETIRAcetam (KEPPRA) 100 MG/ML solution 1,000  mg  1,000 mg Per Tube BID Consuella Lose, MD   1,000 mg at 04/04/17 0944  . loperamide (IMODIUM) 1 MG/5ML solution 2 mg  2 mg Per Tube PRN Pershing Proud, NP   2 mg at 04/02/17 0950  . LORazepam (ATIVAN) injection 1 mg  1 mg Intravenous O7F PRN Vinie Sill C, NP      . MEDLINE mouth rinse  15 mL Mouth Rinse q12n4p Consuella Lose, MD      . metoCLOPramide (REGLAN) 10 MG/10ML solution 5 mg  5 mg Per Tube Daily Meyran, Ocie Cornfield, NP   5 mg at 04/03/17 0839  . morphine 2 MG/ML injection 2 mg  2 mg Intravenous Q2H PRN Earnie Larsson, MD   2 mg at 04/02/17 2045  . multivitamin liquid 15 mL  15 mL Per Tube Daily Vinie Sill C, NP   15 mL at 04/03/17 0827  . nystatin (MYCOSTATIN) 100000 UNIT/ML suspension 500,000 Units  5 mL Oral QID Eleonore Chiquito, NP   500,000 Units at 04/04/17 [phone]  . pantoprazole sodium (PROTONIX) 40 mg/20 mL oral suspension 40 mg  40 mg Per Tube Daily Pershing Proud, NP   40 mg at 04/04/17 2951     Discharge Medications: Please see discharge summary for a list of discharge medications.  Relevant Imaging Results:  Relevant Lab Results:   Additional Information SSN: [id number]  Truitt Merle, LCSW

## 2017-04-04 NOTE — Care Management Note (Addendum)
Case Management Note Original Note Created by Ellan Lambert  Patient Details  Name: Carly [name] MRN: [id number] Date of Birth: [birth year]  Subjective/Objective:  Pt admitted on 03/03/17 with AMS and ICH.  PTA, pt independent, lives with spouse.                    Action/Plan: Pt currently remains sedated and intubated.  Will follow for discharge planning as pt progresses.    Expected Discharge Date:                  Expected Discharge Plan:  IP Rehab Facility  In-House Referral:  Clinical Social Work  Discharge planning Services  CM Consult  Post Acute Care Choice:    Choice offered to:     DME Arranged:    DME Agency:     HH Arranged:    [place] Agency:     Status of Service:  In process, will continue to follow  If discussed at [place] of Stay Meetings, dates discussed:   04/04/2017  CM received call back from [place] PA with neuro - pt is ready for discharge to SNF - CSW contacted and informed pt is ready for discharge  CM contacted attending requesting discharge to SNF consideration - CSW already following  04/03/17 Discussed in LOS 7/19 - pt will need to discharge to either CIR or SNF when medically stable.  Pt vomited earlier this am and tube feeds had to be stopped - CIR likely will not have bed for pt and therefore CSW informed today that pt will most likely need SNF bed probable discharge 7/20 pending medical stability  04/02/17 Pt transferred to [place].  CIR recommended - CSW consulted for back up plan - pt not yet tolerating therapy parameters for CIR  Additional Comments:  03/25/17 J. Amerson, RN, BSN Family gathered at bedside for extubation.  Husband has chosen not to be present.    03/31/17 J. Amerson, RN, BSN Pt now awake and following commands.  Plan PEG placement tomorrow, per surgical team.  Pt has orders for transfer to SDU.   EVD discontinued.  Will continue to follow progress.    Reinaldo Raddle, RN, BSN  Trauma/Neuro ICU Case Manager [phone]

## 2017-04-04 NOTE — Discharge Summary (Signed)
Physician Discharge Summary  Patient ID: Carly [name] MRN: [id number] DOB/AGE: 05-14-1971 46 y.o.  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 female who Presented to [facility] on 03/03/2017 for acute onset worst headache of life.  Prior to CT scan, she decompensated and became unresponsive requiring intubation.   CT scan showed a ruptured intracranial aneurysm with hemorrhage.  EVD was emergently placed by Dr. Cyndy Freeze and Phillips Odor, PA-C  While in the emergency room.  Critical Care was consulted for assistance with ventilator management.  Hospital course was complicated by acute respiratory distress as well as pneumonia and sepsis.   On 07/10, her has been decided to extubate and switched to palliative care if necessary.   Fortunately, she had extubation well is able to breathe on her own.  She has slowly progressed since then including able to speak simple since his and follow commands.  She can move all her extremities although they are weak.   PEG tube was placed for nutritional support.  Since extubation, she has continued to progress.  Believe that she is ready for the next phase care.  She will be transferred CIR for rehab.  Treatments: Surgery - Diagnostic angiogram  Discharge Exam: Blood pressure (!) 107/58, pulse 90, temperature 99.1 F (37.3 C),  temperature source Oral, resp. rate (!) 22, height 5\' 7"  (1.702 m), weight 98.2 kg (216 lb 7.9 oz), SpO2 95 %. Awake, alert Speaks in slow, simple sentences CN grossly intact Moves all extremities, although very weak.  Disposition: SNF Discharge Instructions    Call MD for:  persistant dizziness or light-headedness    Complete by:  As directed    Call MD for:  severe uncontrolled pain    Complete by:  As directed    Diet general    Complete by:  As directed    Increase activity slowly    Complete by:  As directed      Allergies as of 04/09/2017   No Known Allergies     Medication List    TAKE these medications   butalbital-acetaminophen-caffeine 50-325-40-30 MG capsule Commonly known as:  FIORICET WITH CODEINE Take 1 capsule by mouth every 4 (four) hours as needed for headache.   cyclobenzaprine 10 MG tablet Commonly known as:  FLEXERIL Take 10 mg by mouth at bedtime as needed for muscle spasms.   feeding supplement (OSMOLITE 1.2 CAL) Liqd Place 1,000 mLs into feeding tube continuous.   feeding supplement (PRO-STAT SUGAR FREE 64) Liqd Place 30 mLs into feeding tube daily.   HYDROcodone-acetaminophen 7.5-325 mg/15 ml solution Commonly known as:  HYCET Place 10 mLs into feeding tube every 6 (six) hours as needed for moderate pain or severe pain.   levETIRAcetam 100 MG/ML solution Commonly known as:  KEPPRA Place 10 mLs (1,000 mg total) into feeding tube 2 (two) times daily.   MIDOL 200 MG  Caps Generic drug:  Ibuprofen Take 1-2 capsules by mouth daily as needed.   triamterene-hydrochlorothiazide 37.5-25 MG tablet Commonly known as:  MAXZIDE-25 Take 1 tablet by mouth daily.      Follow-up Information    Consuella Lose, MD. Schedule an appointment as soon as possible for a visit in 4 week(s).   Specialty:  Neurosurgery Contact information: [address] [place] [address] [place] [address] [phone]          Signed: Traci Sermon 04/09/2017, 2:45 PM

## 2017-04-05 LAB — GLUCOSE, CAPILLARY
GLUCOSE-CAPILLARY: 130 mg/dL — AB (ref 65–99)
Glucose-Capillary: 128 mg/dL — ABNORMAL HIGH (ref 65–99)
Glucose-Capillary: 123 mg/dL — ABNORMAL HIGH (ref 65–99)
Glucose-Capillary: 127 mg/dL — ABNORMAL HIGH (ref 65–99)
Glucose-Capillary: 117 mg/dL — ABNORMAL HIGH (ref 65–99)

## 2017-04-05 NOTE — Progress Notes (Signed)
Pt seen and examined.  No issues overnight.  EXAM: Temp:  [99.4 F (37.4 C)-101.5 F (38.6 C)] 99.4 F (37.4 C) (07/21 0357) Pulse Rate:  [89-103] 98 (07/21 0357) Resp:  [12-26] 15 (07/21 0357) BP: (102-147)/(54-95) 102/54 (07/21 0357) SpO2:  [95 %-98 %] 97 % (07/21 0357) Weight:  [98.7 kg (217 lb 9.5 oz)] 98.7 kg (217 lb 9.5 oz) (07/21 0500) Intake/Output      07/20 0701 - 07/21 0700 07/21 0701 - 07/22 0700   P.O. 0    Other 180    NG/GT 1829.7    Total Intake(mL/kg) 2009.7 (20.4)    Urine (mL/kg/hr) 1100 (0.5)    Total Output 1100     Net +909.7          Urine Occurrence 1 x    Stool Occurrence 1 x     Awake Answering basic questions today CN grossly intact Moves all extremities but extremely weak  Plan 46 y.o.females/p SAH with generalized deconditioning.  Appears to be ready for next phase of care, hopefully CIR Monday Dispo planning  Staples were removed from EVD site today without difficulties/complication

## 2017-04-06 LAB — GLUCOSE, CAPILLARY
GLUCOSE-CAPILLARY: 130 mg/dL — AB (ref 65–99)
GLUCOSE-CAPILLARY: 130 mg/dL — AB (ref 65–99)
GLUCOSE-CAPILLARY: 138 mg/dL — AB (ref 65–99)
GLUCOSE-CAPILLARY: 162 mg/dL — AB (ref 65–99)
Glucose-Capillary: 139 mg/dL — ABNORMAL HIGH (ref 65–99)
Glucose-Capillary: 113 mg/dL — ABNORMAL HIGH (ref 65–99)
Glucose-Capillary: 149 mg/dL — ABNORMAL HIGH (ref 65–99)

## 2017-04-06 MED ORDER — WHITE PETROLATUM GEL
Status: AC
  Administered 2017-04-06: 23:00:00
  Filled 2017-04-06: qty 1

## 2017-04-06 NOTE — Progress Notes (Signed)
Pt seen and examined.  Elevated temp overnight, resolved with tylenol No concerns  EXAM: Temp:  [99.5 F (37.5 C)-101.2 F (38.4 C)] 99.9 F (37.7 C) (07/22 0408) Pulse Rate:  [91-102] 92 (07/22 0408) Resp:  [14-25] 20 (07/22 0408) BP: (100-148)/(40-74) 130/71 (07/22 0408) SpO2:  [98 %-100 %] 98 % (07/22 0408) Weight:  [97.9 kg (215 lb 13.3 oz)] 97.9 kg (215 lb 13.3 oz) (07/22 0408) Intake/Output      07/21 0701 - 07/22 0700 07/22 0701 - 07/23 0700   NG/GT 1380    Total Intake(mL/kg) 1380 (14.1)    Urine (mL/kg/hr) 1000 (0.4)    Total Output 1000     Net +380          Stool Occurrence 2 x     Awake CN grossly intact Moves all extremities but extremely weak  Plan 46 y.o.females/p SAH with generalized deconditioning.  Appears to be ready for next phase of care, hopefully CIR tomorrow Dispo planning

## 2017-04-07 LAB — GLUCOSE, CAPILLARY
GLUCOSE-CAPILLARY: 132 mg/dL — AB (ref 65–99)
GLUCOSE-CAPILLARY: 157 mg/dL — AB (ref 65–99)
GLUCOSE-CAPILLARY: 118 mg/dL — AB (ref 65–99)
GLUCOSE-CAPILLARY: 120 mg/dL — AB (ref 65–99)
GLUCOSE-CAPILLARY: 114 mg/dL — AB (ref 65–99)
Glucose-Capillary: 131 mg/dL — ABNORMAL HIGH (ref 65–99)

## 2017-04-07 NOTE — Progress Notes (Signed)
Pt more alert and conversing today. Improved strength for bed mobility. Performed B UE AROM at bed level. Sat EOB x 8 minutes. Pt with improving ability to turn eyes toward L. Continue to recommend intensive rehab upon d/c.   04/07/17 1300  OT Visit Information  Last OT Received On 04/07/17  Assistance Needed +2  History of Present Illness Pt is a 46 y/o female with PMH of HTN and migraines. She was admitted on 03/03/2017 with headache and focal seizure activity. CT scan of the head showed subarachnoid hemorrhage with associated IVH in the setting of presumed ruptured aneurysm. Patient developed progressive decrease in level of consciousness, vomited, postured. She was intubated for airway protection and IVC placed per neurosurgery. 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 Positive for active extravasation of hemorrhage. Patient underwent embolization on 03/07/2017.   Precautions  Precautions Fall  Precaution Comments PEG , abdominal binder  Pain Assessment  Pain Assessment No/denies pain  Cognition  Arousal/Alertness Awake/alert  Behavior During Therapy WFL for tasks assessed/performed  Overall Cognitive Status Impaired/Different from baseline  Area of Impairment Safety/judgement  Following Commands Follows one step commands consistently  Safety/Judgement Decreased awareness of deficits  Problem Solving Decreased initiation;Requires verbal cues  General Comments pt wanting bright light off, but did not initiate calling for help  ADL  Overall ADL's  Needs assistance/impaired  General ADL Comments Pt with improved functional use of UEs.  Bed Mobility  Overal bed mobility Needs Assistance  Bed Mobility Supine to Sit;Sit to Supine  Supine to sit Mod assist  Sit to supine Mod assist  General bed mobility comments cues for technique, assist for L LE over EOB and  to raise trunk, assist for LEs back into bed  Balance  Overall balance assessment Needs assistance  Sitting-balance support Feet supported;Bilateral upper extremity supported  Sitting balance-Leahy Scale Poor  Sitting balance - Comments requires B UE support, sat 8 minutes  Postural control Posterior lean;Left lateral lean  Vision- Assessment  Additional Comments continues to have R gaze, but can track to midline and cross midline to look L inconsistently  Exercises  Exercises General Upper Extremity  General Exercises - Upper Extremity  Shoulder Flexion AROM;10 reps;Supine;Both  Elbow Flexion AROM;Both;10 reps;Supine  Elbow Extension AROM;Both;10 reps;Supine  Wrist Flexion AROM;Both;10 reps;Supine  Wrist Extension AROM;Both;10 reps;Supine  Digit Composite Flexion AROM;Both;10 reps;Supine  Composite Extension AROM;Both;10 reps;Supine  OT - End of Session  Activity Tolerance Patient tolerated treatment well  Patient left in bed;with call bell/phone within reach;with bed alarm set  OT Assessment/Plan  OT Plan Discharge plan remains appropriate  OT Visit Diagnosis Muscle weakness (generalized) (M62.81);Pain;Cognitive communication deficit (R41.841);Other symptoms and signs involving cognitive function  OT Frequency (ACUTE ONLY) Min 2X/week  Follow Up Recommendations CIR  OT Equipment 3 in 1 bedside commode;Wheelchair (measurements OT);Wheelchair cushion (measurements OT);Hospital bed  AM-PAC OT "6 Clicks" Daily Activity Outcome Measure  Help from another person eating meals? 1  Help from another person taking care of personal grooming? 1  Help from another person toileting, which includes using toliet, bedpan, or urinal? 1  Help from another person bathing (including washing, rinsing, drying)? 1  Help from another person to put on and taking off regular upper body clothing? 1  Help from another person to put on and taking off regular lower body clothing? 1  6 Click Score 6  ADL G  Code Conversion CN  OT Goal Progression  Progress towards OT goals Progressing toward goals  Acute Rehab OT Goals  Patient Stated Goal didn't state  Time For Goal Achievement 04/17/17  Potential to Achieve Goals Good  OT Time Calculation  OT Start Time (ACUTE ONLY) 1325  OT Stop Time (ACUTE ONLY) 1347  OT Time Calculation (min) 22 min  OT General Charges  $OT Visit 1 Procedure  OT Treatments  $Therapeutic Activity 8-22 mins  04/07/2017 Nestor Lewandowsky, OTR/L Pager: [phone]

## 2017-04-07 NOTE — Progress Notes (Signed)
Physical Therapy Treatment Patient Details Name: Carly [name] MRN: [id number] DOB: 01/08/1971 Today's Date: 04/07/2017    History of Present Illness Pt is a 46 y/o female with PMH of HTN and migraines. She was admitted on 03/03/2017 with headache and focal seizure activity. CT scan of the head showed subarachnoid hemorrhage with associated IVH in the setting of presumed ruptured aneurysm. Patient developed progressive decrease in level of consciousness, vomited, postured. She was intubated for airway protection and IVC placed per neurosurgery. 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 Positive for active extravasation of hemorrhage. Patient underwent embolization on 03/07/2017.     PT Comments    Pt A&O x 2 and presents with impaired short term memory by stating she has been trying to eat food recently; however pt still has peg tube. Pt improving with transfers; however continues to have impaired balance with leaning and presents with significant visual deficits. During treatment, pt with unsteady gaze and unable to see targets given from PT. Pt is motivated to improve and will benefit from continued acute therapy for mobilization, balance training and strengthening.   Vitals:  BP 122/79  SpO2 97%   Follow Up Recommendations  CIR     Equipment Recommendations  Wheelchair (measurements PT);Wheelchair cushion (measurements PT);Rolling walker with 5" wheels;3in1 (PT)    Recommendations for Other Services       Precautions / Restrictions Precautions Precautions: Fall Precaution Comments: PEG , abdominal binder Restrictions Weight Bearing Restrictions: No    Mobility  Bed Mobility Overal bed mobility: Needs Assistance Bed Mobility: Rolling;Sidelying to Sit Rolling: Min assist Sidelying to sit: Mod assist;+2 for safety/equipment       General bed  mobility comments: cues for sequence and direction with use of rail pt able to roll bil for pericare and linen change. Pt with incontinent stool on arrival. Pt with assist to bring legs off of bed and elevate trunk into sitting  Transfers Overall transfer level: Needs assistance   Transfers: Sit to/from Stand;Stand Pivot Transfers Sit to Stand: Mod assist;+2 physical assistance;From elevated surface Stand pivot transfers: Mod assist;+2 physical assistance;From elevated surface       General transfer comment: Pt able to stand with assist to rise and for anterior translation. Pt with good static standing balance with arm on P.T. arm. Pt able to step with bil UE support from bed to chair without buckling. Cues to step fully to chair prior to sitting. min assist for reciprocal scooting to EOB and back in chair with pad  Ambulation/Gait             General Gait Details: unable   Stairs            Wheelchair Mobility    Modified Rankin (Stroke Patients Only) Modified Rankin (Stroke Patients Only) Pre-Morbid Rankin Score: No symptoms Modified Rankin: Severe disability     Balance Overall balance assessment: Needs assistance   Sitting balance-Leahy Scale: Poor Sitting balance - Comments: EOB 5 min with bil UE support with tendency for right lean and unable to correct with only cues     Standing balance-Leahy Scale: Poor Standing balance comment: midline posture with bil UE support                            Cognition Arousal/Alertness: Awake/alert Behavior During Therapy: Flat affect Overall Cognitive Status:  Impaired/Different from baseline Area of Impairment: Following commands;Attention;Problem solving                   Current Attention Level: Sustained   Following Commands: Follows one step commands consistently     Problem Solving: Decreased initiation;Requires verbal cues General Comments: pt with positive affect but states she can see  things but unable to visually track and stating she has been feeding herself (NPO with PEg)      Exercises General Exercises - Lower Extremity Long Arc Quad: AROM;Both;Seated;10 reps    General Comments        Pertinent Vitals/Pain Pain Assessment: No/denies pain    Home Living                      Prior Function            PT Goals (current goals can now be found in the care plan section) Progress towards PT goals: Progressing toward goals    Frequency           PT Plan Current plan remains appropriate    Co-evaluation              AM-PAC PT "6 Clicks" Daily Activity  Outcome Measure  Difficulty turning over in bed (including adjusting bedclothes, sheets and blankets)?: Total Difficulty moving from lying on back to sitting on the side of the bed? : Total Difficulty sitting down on and standing up from a chair with arms (e.g., wheelchair, bedside commode, etc,.)?: A Lot Help needed moving to and from a bed to chair (including a wheelchair)?: A Lot Help needed walking in hospital room?: Total Help needed climbing 3-5 steps with a railing? : Total 6 Click Score: 8    End of Session Equipment Utilized During Treatment: Gait belt;Other (comment) (abdominal binder) Activity Tolerance: Patient tolerated treatment well Patient left: in chair;with chair alarm set;with call bell/phone within reach Nurse Communication: Mobility status;Precautions PT Visit Diagnosis: Muscle weakness (generalized) (M62.81);Difficulty in walking, not elsewhere classified (R26.2);Other abnormalities of gait and mobility (R26.89)     Time: 1478-2956 PT Time Calculation (min) (ACUTE ONLY): 33 min  Charges:  $Therapeutic Activity: 23-37 mins                    G Codes:       Elberta Leatherwood, SPT Acute Rehab [place] 04/07/2017, 10:29 AM

## 2017-04-07 NOTE — Clinical Social Work Note (Addendum)
CSW consulted for "Skilled nursing facility placement as backup plan to CIR." CSW has been in communication with pt's spouse-Jerry Lund [phone]) about disposition for pt, as pt has been confused, oriented to self only. Spouse toured [facility] and [facility] and declined. Pt has 2 other bed offers at [facility] and [facility]. Spouse to tour those facilites. Pt is approved for admittance to CIR, but bed not available. CSW left VM for spouse. CSW will continue to follow.   Oretha Ellis, [place], [place] Work [phone]

## 2017-04-07 NOTE — Progress Notes (Signed)
No issues overnight. Pt reports feeling well, no complaints.  EXAM:  BP 109/87 (BP Location: Left Arm)   Pulse (!) 106   Temp 99.3 F (37.4 C) (Oral)   Resp 18   Ht 5\' 7"  (1.702 m)   Wt 97.6 kg (215 lb 2.7 oz)   LMP  (LMP Unknown) Comment: pt is unresponsive, and is unable to communicate  SpO2 100%   BMI 33.70 kg/m   Awake, alert, oriented  Speech fluent CN grossly intact  Antigravity strength BUE/BLE  IMPRESSION:  46 y.o. female s/p SAH, recovering well. Generalized weakness and level of alertness is significantly improved. Believe she would benefits from CIR.  PLAN: - Ready for transfer when bed available.

## 2017-04-07 NOTE — Progress Notes (Signed)
Nutrition Follow-up  DOCUMENTATION CODES:   Obesity unspecified  INTERVENTION:   -Continue Osmolite 1.2 @ 60 ml/hr via PEG  30 ml Prostat daily.    Regimen providing 1828 kcals, 95 grams protein, and 1180 ml free water daily (meeting 100% of estimated kcal and protein needs).   NUTRITION DIAGNOSIS:   Inadequate oral intake related to inability to eat as evidenced by NPO status.  Ongoing  GOAL:   Patient will meet greater than or equal to 90% of their needs  Met with TF  MONITOR:   I & O's, TF tolerance, Skin  REASON FOR ASSESSMENT:   Consult, Ventilator Enteral/tube feeding initiation and management  ASSESSMENT:   Pt with PMH of HTN, GERD, and migraines admitted with SAH, IVH, ruptured aneurysm s/p coiling of left ICA aneurysm and IVC placed 6/18.  7/17- PEG placed 7/19- episode of vomiting occurred. TF held for 2 hours and slowly increased to goal  Pt resting, sitting in recliner chair at time of visit. Noted TF of Osmolite 1.2 running at goal rate of 60 ml/hr via PEG. Pt also receiving 30 ml Prostat daily via PEG. Complete regimen providing 1828 kcals, 95 grams protein, and 1180 ml free water daily (meeting 100% of estimated kcal and protein needs).   Spoke with RN, who reports pt is tolerating TF well. She shares pt has been making good progress and more alert today. Plan to d/c to CIR vs SNF, based upon bed availability.   Labs reviewed: CBGS: 131-157.   Diet Order:   NPO  Skin:  Reviewed, no issues  Last BM:  04/06/17  Height:   Ht Readings from Last 1 Encounters:  03/03/17 '5\' 7"'  (1.702 m)    Weight:   Wt Readings from Last 1 Encounters:  04/07/17 215 lb 2.7 oz (97.6 kg)    Ideal Body Weight:  61.3 kg  BMI:  Body mass index is 33.7 kg/m.  Estimated Nutritional Needs:   Kcal:  1700-1900  Protein:  90-110 grams  Fluid:  > 2 L/day  EDUCATION NEEDS:   No education needs identified at this time  Darsha Zumstein A. Jimmye Norman, RD, LDN,  CDE Pager: [phone] After hours Pager: [phone]

## 2017-04-08 ENCOUNTER — Inpatient Hospital Stay (HOSPITAL_COMMUNITY): Payer: 59

## 2017-04-08 LAB — GLUCOSE, CAPILLARY
GLUCOSE-CAPILLARY: 129 mg/dL — AB (ref 65–99)
GLUCOSE-CAPILLARY: 128 mg/dL — AB (ref 65–99)
GLUCOSE-CAPILLARY: 154 mg/dL — AB (ref 65–99)
GLUCOSE-CAPILLARY: 123 mg/dL — AB (ref 65–99)
Glucose-Capillary: 150 mg/dL — ABNORMAL HIGH (ref 65–99)
Glucose-Capillary: 143 mg/dL — ABNORMAL HIGH (ref 65–99)

## 2017-04-08 NOTE — Clinical Social Work Note (Addendum)
CSW called patient's husband to see if he had decided on a SNF. He stated he was touring [facility] and the admissions coordinator would call CSW when complete. [facility] declined bed offer initially but CSW faxed information over again.  [name], [name] [phone]  2:14 pm CSW left voicemail for admissions coordinator at [facility] to check status of referral decision. CSW updated patient's son. Patient's son concerned with patient's eye sight. RN notified and is already aware.  [name], [name] [phone]  2:48 pm CSW attempted calling admissions coordinator again. Did not leave a second voicemail.  [name], [name] [phone]  3:39 pm CSW tried calling admissions coordinator again. Did not leave voicemail.  [name], [name]

## 2017-04-08 NOTE — Progress Notes (Signed)
Modified Barium Swallow Progress Note  Patient Details  Name: Carly [name] MRN: [id number] Date of Birth: March 24, 1971  Today's Date: 04/08/2017  Modified Barium Swallow completed.  Full report located under Chart Review in the Imaging Section.  Brief recommendations include the following:  Clinical Impression  Pt presents with a normal oropharyngeal swallow. There is adequate mastication; strong propulsion of materials through pharynx; no residue post-swallow; no penetration nor aspiration.  Pt is safe to resume a regular consistency diet, thin liquids, meds whole with liquids.  Results/recs reviewed with Mr. and Mrs. Gautier, both of whom were very pleased with results.  Rec f/u re: ceasing enteral feedings when appropriate.    Swallow Evaluation Recommendations       SLP Diet Recommendations: Regular solids;Thin liquid   Liquid Administration via: Cup;Straw   Medication Administration: Whole meds with liquid   Supervision: Patient able to self feed;Staff to assist with self feeding   Compensations: Minimize environmental distractions       Oral Care Recommendations: Oral care BID        [name] 04/08/2017,3:23 PM

## 2017-04-08 NOTE — Progress Notes (Signed)
Rehab admissions - Continuing to follow.  No beds available on rehab for this patient at this time.  Call me for questions.  #[phone]

## 2017-04-08 NOTE — Care Management Note (Signed)
Case Management Note  Patient Details  Name: Carly [name] MRN: [id number] Date of Birth: 25-Dec-1970  Subjective/Objective:  SAH, plan is CIR vs SNF, CIR with no beds, CSW following for back up SNF.  Patient should be able to dc to SNF today.                 Action/Plan: NCM will follow along with CSW for dc needs.  Expected Discharge Date:                  Expected Discharge Plan:  IP Rehab Facility  In-House Referral:  Clinical Social Work  Discharge planning Services  CM Consult  Post Acute Care Choice:    Choice offered to:     DME Arranged:    DME Agency:     HH Arranged:    [place] Agency:     Status of Service:  In process, will continue to follow  If discussed at Long Length of Stay Meetings, dates discussed:    Additional Comments:  Zenon Mayo, RN 04/08/2017, 9:34 AM

## 2017-04-08 NOTE — NC FL2 (Signed)
[place] MEDICAID FL2 LEVEL OF CARE SCREENING TOOL     IDENTIFICATION  Patient Name: Carly [name] Birthdate: 09-14-1971 Sex: female Admission Date (Current Location): 03/03/2017  [facility] and [place] Number:  Herbalist and Address:  [facility]. [facility], [place] [address] [place], [place] [address]      Provider Number: [id number]  Attending Physician Name and Address:  Consuella Lose, MD  Relative Name and Phone Number:       Current Level of Care: Hospital Recommended Level of Care: [place] Prior Approval Number:    Date Approved/Denied:   PASRR Number: [id number]  Discharge Plan: SNF    Current Diagnoses: Patient Active Problem List   Diagnosis Date Noted  . Ventilator dependent ([place])   . Palliative care by specialist   . Electrolyte imbalance 03/19/2017  . History of ETT   . Encounter for intubation   . Subarachnoid hemorrhage ([place])   . Acute respiratory failure ([place])   . Ruptured cerebral aneurysm ([place]) 03/03/2017  . Routine general medical examination at a health care facility 08/23/2013  . Essential hypertension, benign 08/23/2013  . Impaired fasting glucose 08/23/2013  . Plantar fasciitis, bilateral 08/23/2013  . Unspecified vitamin D deficiency 08/23/2013  . Obesity, unspecified 08/23/2013  . Unspecified essential hypertension 12/31/2012  . GERD (gastroesophageal reflux disease) 12/31/2012  . COMMON MIGRAINE 06/16/2008  . CHEST PAIN, INTERMITTENT 06/16/2008    Orientation RESPIRATION BLADDER Height & Weight     Self, Time  Normal Incontinent, External catheter Weight: 216 lb 11.4 oz (98.3 kg) Height:  5\' 7"  (170.2 cm)  BEHAVIORAL SYMPTOMS/MOOD NEUROLOGICAL BOWEL NUTRITION STATUS   None   Incontinent (Percutaneous endoscopic gastrostomy, rectal tube with balloon.) Diet (Regular). Also has a PEG.  AMBULATORY STATUS COMMUNICATION OF NEEDS Skin   Extensive Assist Verbal Other (Comment) (Catheter  entry/exit, MASD.)                       Personal Care Assistance Level of Assistance  Bathing, Feeding, Dressing Bathing Assistance: Maximum assistance Feeding assistance: Limited assistance Dressing Assistance: Maximum assistance     Functional Limitations Info  Sight, Hearing, Speech Sight Info: Adequate Hearing Info: Adequate Speech Info: Adequate    SPECIAL CARE FACTORS FREQUENCY  Blood pressure     PT Frequency: 5 x week OT Frequency: 5 x week            Contractures Contractures Info: Not present    Additional Factors Info  Code Status Code Status Info: DNR Allergies Info: No known allergies           Current Medications (04/08/2017):  This is the current hospital active medication list Current Facility-Administered Medications  Medication Dose Route Frequency Provider Last Rate Last Dose  .  stroke: mapping our early stages of recovery book   Does not apply Once Vinie Sill C, NP      . 3.66 % sodium chloride infusion   Intravenous PRN Chesley Mires, MD 10 mL/hr at 03/31/17 1600    . acetaminophen (TYLENOL) solution 650 mg  650 mg Per Tube Q9U PRN Pershing Proud, NP   765 mg at 04/06/17 1823  . acetaminophen (TYLENOL) suppository 650 mg  650 mg Rectal Q4H PRN Traci Sermon, PA-C   650 mg at 03/20/17 2303  . chlorhexidine (PERIDEX) 0.12 % solution 15 mL  15 mL Mouth Rinse BID Consuella Lose, MD   15 mL at 04/08/17 0931  .  feeding supplement (OSMOLITE 1.2 CAL) liquid 1,000 mL  1,000 mL Per Tube Continuous Rayburn, Kelly A, PA-C 60 mL/hr at 04/06/17 1838 1,000 mL at 04/06/17 1838  . feeding supplement (PRO-STAT SUGAR FREE 64) liquid 30 mL  30 mL Per Tube Daily Consuella Lose, MD   30 mL at 04/08/17 0917  . HYDROcodone-acetaminophen (HYCET) 7.5-325 mg/15 ml solution 10 mL  10 mL Per Tube Q6H PRN Izora Gala A, PA-C   10 mL at 04/06/17 2027  . labetalol (NORMODYNE,TRANDATE) injection 10 mg  10 mg Intravenous Z6X PRN Pershing Proud, NP       . levETIRAcetam (KEPPRA) 100 MG/ML solution 1,000 mg  1,000 mg Per Tube BID Consuella Lose, MD   1,000 mg at 04/08/17 0930  . loperamide (IMODIUM) 1 MG/5ML solution 2 mg  2 mg Per Tube PRN Pershing Proud, NP   2 mg at 04/02/17 0950  . LORazepam (ATIVAN) injection 1 mg  1 mg Intravenous W9U PRN Vinie Sill C, NP      . MEDLINE mouth rinse  15 mL Mouth Rinse q12n4p Consuella Lose, MD   15 mL at 04/07/17 1600  . metoCLOPramide (REGLAN) 10 MG/10ML solution 5 mg  5 mg Per Tube Daily Meyran, Ocie Cornfield, NP   5 mg at 04/07/17 1022  . morphine 2 MG/ML injection 2 mg  2 mg Intravenous Q2H PRN Earnie Larsson, MD   2 mg at 04/06/17 0411  . multivitamin liquid 15 mL  15 mL Per Tube Daily Vinie Sill C, NP   15 mL at 04/08/17 0930  . nystatin (MYCOSTATIN) 100000 UNIT/ML suspension 500,000 Units  5 mL Oral QID Eleonore Chiquito, NP   500,000 Units at 04/08/17 0930  . pantoprazole sodium (PROTONIX) 40 mg/20 mL oral suspension 40 mg  40 mg Per Tube Daily Pershing Proud, NP   40 mg at 04/08/17 0454     Discharge Medications: Please see discharge summary for a list of discharge medications.  Relevant Imaging Results:  Relevant Lab Results:   Additional Information SSN: [id number]  Candie Chroman, LCSW

## 2017-04-08 NOTE — Progress Notes (Signed)
  Speech Language Pathology Treatment: Dysphagia;Cognitive-Linquistic  Patient Details Name: Carly [name] MRN: [id number] DOB: Feb 15, 1971 Today's Date: 04/08/2017 Time: 1000-1030 SLP Time Calculation (min) (ACUTE ONLY): 30 min  Assessment / Plan / Recommendation Clinical Impression  Pt has had significant improvement since last seen by SLP services.  She is initiating communication; speech is clear, fluent, and spontaneous.  She is inquiring about her care.  Pt is oriented to person and place; some elements of time. Following commands with improved response time.  She completed oral care independently.  Consumed ice chips with adequate mastication, brisk swallow response, strong phonation and no overt s/s of aspiration.    Pt has not had instrumental swallow study - she is ready today. Scheduled for 2:30 this afternoon. D/W pt and RN.    HPI HPI: 46 y.o. female  with past medical history of Hypertension, GERD, migraine headaches admitted on 03/03/2017 with headache and focal seizure activity. CT scan of the head showed subarachnoid hemorrhage with associated IVH in the setting of presumed ruptured aneurysm. Patient developed progressive decrease in level of consciousness, vomited, postured. She was intubated for airway protection and IVC placed per neurosurgery. 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 Positive for active extravasation of hemorrhage. Patient underwent embolization on 03/07/2017.       SLP Plan  MBS;Continue with current plan of care       Recommendations                   Oral Care Recommendations: Oral care QID SLP Visit Diagnosis: Dysphagia, unspecified (R13.10) Plan: MBS;Continue with current plan of care       GO              Carly [name] L. Tivis Ringer, [place] CCC/SLP Pager [phone]   Carly [name] Carly [name] 04/08/2017, 10:42  AM

## 2017-04-08 NOTE — Progress Notes (Signed)
Pt seen and examined.  No issues overnight.  EXAM: Temp:  [98.8 F (37.1 C)-100.1 F (37.8 C)] 98.8 F (37.1 C) (07/24 1155) Pulse Rate:  [91-103] 94 (07/24 1155) Resp:  [11-22] 22 (07/24 1155) BP: (110-125)/(54-81) 117/54 (07/24 1155) SpO2:  [96 %-99 %] 99 % (07/24 1155) Weight:  [98.3 kg (216 lb 11.4 oz)] 98.3 kg (216 lb 11.4 oz) (07/24 0342) Intake/Output      07/23 0701 - 07/24 0700 07/24 0701 - 07/25 0700   P.O. 0    Other 40    NG/GT 540 420   Total Intake(mL/kg) 580 (5.9) 420 (4.3)   Urine (mL/kg/hr) 200 (0.1) 450 (0.6)   Total Output 200 450   Net +380 -30        Urine Occurrence 2 x    Stool Occurrence 3 x     Awake, alert, oriented  Speech fluent CN grossly intact  Antigravity strength BUE/BLE  Plan Stable. Continue current care. Ready for bed when possible

## 2017-04-09 ENCOUNTER — Inpatient Hospital Stay (HOSPITAL_COMMUNITY)
Admission: RE | Admit: 2017-04-09 | Discharge: 2017-04-23 | DRG: 057 | Disposition: A | Discharge status: Home Health | Payer: 59 | Source: Intra-hospital | Attending: Physical Medicine & Rehabilitation | Admitting: Physical Medicine & Rehabilitation

## 2017-04-09 DIAGNOSIS — I69398 Other sequelae of cerebral infarction: Secondary | ICD-10-CM | POA: Diagnosis not present

## 2017-04-09 DIAGNOSIS — R7303 Prediabetes: Secondary | ICD-10-CM | POA: Diagnosis present

## 2017-04-09 DIAGNOSIS — R6510 Systemic inflammatory response syndrome (SIRS) of non-infectious origin without acute organ dysfunction: Secondary | ICD-10-CM

## 2017-04-09 DIAGNOSIS — T368X5A Adverse effect of other systemic antibiotics, initial encounter: Secondary | ICD-10-CM | POA: Diagnosis present

## 2017-04-09 DIAGNOSIS — F4323 Adjustment disorder with mixed anxiety and depressed mood: Secondary | ICD-10-CM | POA: Diagnosis present

## 2017-04-09 DIAGNOSIS — R638 Other symptoms and signs concerning food and fluid intake: Secondary | ICD-10-CM | POA: Diagnosis not present

## 2017-04-09 DIAGNOSIS — R0989 Other specified symptoms and signs involving the circulatory and respiratory systems: Secondary | ICD-10-CM

## 2017-04-09 DIAGNOSIS — R740 Nonspecific elevation of levels of transaminase and lactic acid dehydrogenase [LDH]: Secondary | ICD-10-CM | POA: Diagnosis present

## 2017-04-09 DIAGNOSIS — D62 Acute posthemorrhagic anemia: Secondary | ICD-10-CM | POA: Diagnosis present

## 2017-04-09 DIAGNOSIS — F4321 Adjustment disorder with depressed mood: Secondary | ICD-10-CM

## 2017-04-09 DIAGNOSIS — E876 Hypokalemia: Secondary | ICD-10-CM | POA: Diagnosis present

## 2017-04-09 DIAGNOSIS — K9423 Gastrostomy malfunction: Secondary | ICD-10-CM | POA: Diagnosis present

## 2017-04-09 DIAGNOSIS — T85528A Displacement of other gastrointestinal prosthetic devices, implants and grafts, initial encounter: Secondary | ICD-10-CM

## 2017-04-09 DIAGNOSIS — Z931 Gastrostomy status: Secondary | ICD-10-CM

## 2017-04-09 DIAGNOSIS — R4189 Other symptoms and signs involving cognitive functions and awareness: Secondary | ICD-10-CM | POA: Diagnosis present

## 2017-04-09 DIAGNOSIS — R7401 Elevation of levels of liver transaminase levels: Secondary | ICD-10-CM

## 2017-04-09 DIAGNOSIS — R509 Fever, unspecified: Secondary | ICD-10-CM

## 2017-04-09 DIAGNOSIS — I69228 Other speech and language deficits following other nontraumatic intracranial hemorrhage: Secondary | ICD-10-CM | POA: Diagnosis present

## 2017-04-09 DIAGNOSIS — Z431 Encounter for attention to gastrostomy: Secondary | ICD-10-CM

## 2017-04-09 DIAGNOSIS — R1310 Dysphagia, unspecified: Secondary | ICD-10-CM | POA: Diagnosis present

## 2017-04-09 DIAGNOSIS — N179 Acute kidney failure, unspecified: Secondary | ICD-10-CM | POA: Diagnosis present

## 2017-04-09 DIAGNOSIS — R0682 Tachypnea, not elsewhere classified: Secondary | ICD-10-CM

## 2017-04-09 DIAGNOSIS — R404 Transient alteration of awareness: Secondary | ICD-10-CM

## 2017-04-09 DIAGNOSIS — Z8249 Family history of ischemic heart disease and other diseases of the circulatory system: Secondary | ICD-10-CM | POA: Diagnosis not present

## 2017-04-09 DIAGNOSIS — I69319 Unspecified symptoms and signs involving cognitive functions following cerebral infarction: Secondary | ICD-10-CM

## 2017-04-09 DIAGNOSIS — Z79899 Other long term (current) drug therapy: Secondary | ICD-10-CM | POA: Diagnosis not present

## 2017-04-09 DIAGNOSIS — Z66 Do not resuscitate: Secondary | ICD-10-CM | POA: Diagnosis present

## 2017-04-09 DIAGNOSIS — I10 Essential (primary) hypertension: Secondary | ICD-10-CM | POA: Diagnosis present

## 2017-04-09 DIAGNOSIS — R609 Edema, unspecified: Secondary | ICD-10-CM | POA: Diagnosis not present

## 2017-04-09 DIAGNOSIS — K219 Gastro-esophageal reflux disease without esophagitis: Secondary | ICD-10-CM | POA: Diagnosis present

## 2017-04-09 DIAGNOSIS — I609 Nontraumatic subarachnoid hemorrhage, unspecified: Secondary | ICD-10-CM

## 2017-04-09 DIAGNOSIS — I720 Aneurysm of carotid artery: Secondary | ICD-10-CM | POA: Diagnosis present

## 2017-04-09 DIAGNOSIS — R000 Tachycardia, unspecified: Secondary | ICD-10-CM

## 2017-04-09 DIAGNOSIS — G919 Hydrocephalus, unspecified: Secondary | ICD-10-CM

## 2017-04-09 HISTORY — DX: Aneurysm of carotid artery: I72.0

## 2017-04-09 LAB — GLUCOSE, CAPILLARY
GLUCOSE-CAPILLARY: 115 mg/dL — AB (ref 65–99)
GLUCOSE-CAPILLARY: 114 mg/dL — AB (ref 65–99)
Glucose-Capillary: 129 mg/dL — ABNORMAL HIGH (ref 65–99)
Glucose-Capillary: 150 mg/dL — ABNORMAL HIGH (ref 65–99)
Glucose-Capillary: 147 mg/dL — ABNORMAL HIGH (ref 65–99)

## 2017-04-09 MED ORDER — FLEET ENEMA 7-19 GM/118ML RE ENEM: 1.0000 | ENEMA | Freq: Once | RECTAL | Status: DC | PRN

## 2017-04-09 MED ORDER — POLYETHYLENE GLYCOL 3350 17 G PO PACK
17.0000 g | PACK | Freq: Every day | ORAL | Status: DC | PRN
Start: 2017-04-09 — End: 2017-04-23

## 2017-04-09 MED ORDER — ADULT MULTIVITAMIN LIQUID CH
15.0000 mL | Freq: Every day | ORAL | Status: DC
  Filled 2017-04-09: qty 15

## 2017-04-09 MED ORDER — SODIUM CHLORIDE 0.9% FLUSH: 10.0000 mL | INTRAVENOUS | Status: DC | PRN

## 2017-04-09 MED ORDER — METHYLPHENIDATE HCL 5 MG PO TABS: 5.0000 mg | ORAL_TABLET | Freq: Two times a day (BID) | ORAL | Status: DC

## 2017-04-09 MED ORDER — INSULIN ASPART 100 UNIT/ML ~~LOC~~ SOLN
0.0000 [IU] | Freq: Three times a day (TID) | SUBCUTANEOUS | Status: DC
  Administered 2017-04-11 – 2017-04-18 (×2): 1 [IU] via SUBCUTANEOUS

## 2017-04-09 MED ORDER — PROCHLORPERAZINE MALEATE 5 MG PO TABS: 5.0000 mg | ORAL_TABLET | Freq: Four times a day (QID) | ORAL | Status: DC | PRN

## 2017-04-09 MED ORDER — LEVETIRACETAM 100 MG/ML PO SOLN: 1000.0000 mg | Freq: Two times a day (BID) | ORAL | 12 refills | Status: DC

## 2017-04-09 MED ORDER — OSMOLITE 1.2 CAL PO LIQD: 1000.0000 mL | ORAL | 0 refills | Status: DC

## 2017-04-09 MED ORDER — PANTOPRAZOLE SODIUM 40 MG PO PACK
40.0000 mg | PACK | Freq: Every day | ORAL | Status: DC
  Filled 2017-04-09: qty 20

## 2017-04-09 MED ORDER — PROCHLORPERAZINE 25 MG RE SUPP
12.5000 mg | Freq: Four times a day (QID) | RECTAL | Status: DC | PRN
  Filled 2017-04-09: qty 1

## 2017-04-09 MED ORDER — SODIUM CHLORIDE 0.9% FLUSH
10.0000 mL | Freq: Two times a day (BID) | INTRAVENOUS | Status: DC
  Administered 2017-04-10 – 2017-04-21 (×9): 10 mL

## 2017-04-09 MED ORDER — PRO-STAT SUGAR FREE PO LIQD
30.0000 mL | Freq: Every day | ORAL | Status: DC
  Administered 2017-04-10 – 2017-04-15 (×6): 30 mL
  Filled 2017-04-09 (×5): qty 30

## 2017-04-09 MED ORDER — TRAZODONE HCL 50 MG PO TABS: 25.0000 mg | ORAL_TABLET | Freq: Every evening | ORAL | Status: DC | PRN

## 2017-04-09 MED ORDER — HYDROCODONE-ACETAMINOPHEN 7.5-325 MG/15ML PO SOLN
10.0000 mL | Freq: Four times a day (QID) | ORAL | Status: DC | PRN
  Administered 2017-04-11 – 2017-04-12 (×2): 10 mL
  Filled 2017-04-09 (×2): qty 15

## 2017-04-09 MED ORDER — GUAIFENESIN-DM 100-10 MG/5ML PO SYRP: 5.0000 mL | ORAL_SOLUTION | Freq: Four times a day (QID) | ORAL | Status: DC | PRN

## 2017-04-09 MED ORDER — INSULIN ASPART 100 UNIT/ML ~~LOC~~ SOLN: 0.0000 [IU] | Freq: Every day | SUBCUTANEOUS | Status: DC

## 2017-04-09 MED ORDER — PROCHLORPERAZINE EDISYLATE 5 MG/ML IJ SOLN: 5.0000 mg | Freq: Four times a day (QID) | INTRAMUSCULAR | Status: DC | PRN

## 2017-04-09 MED ORDER — CHLORHEXIDINE GLUCONATE 0.12 % MT SOLN
15.0000 mL | Freq: Two times a day (BID) | OROMUCOSAL | Status: DC
  Administered 2017-04-09 – 2017-04-23 (×28): 15 mL via OROMUCOSAL
  Filled 2017-04-09 (×28): qty 15

## 2017-04-09 MED ORDER — DIPHENHYDRAMINE HCL 12.5 MG/5ML PO ELIX: 12.5000 mg | ORAL_SOLUTION | Freq: Four times a day (QID) | ORAL | Status: DC | PRN

## 2017-04-09 MED ORDER — PRO-STAT SUGAR FREE PO LIQD
30.0000 mL | Freq: Every day | ORAL | 0 refills | Status: DC
Start: 2017-04-10 — End: 2017-04-23

## 2017-04-09 MED ORDER — LORAZEPAM 2 MG/ML IJ SOLN: 1.0000 mg | INTRAMUSCULAR | Status: DC | PRN

## 2017-04-09 MED ORDER — ALUM & MAG HYDROXIDE-SIMETH 200-200-20 MG/5ML PO SUSP: 30.0000 mL | ORAL | Status: DC | PRN

## 2017-04-09 MED ORDER — HYDROCODONE-ACETAMINOPHEN 7.5-325 MG/15ML PO SOLN: 10.0000 mL | Freq: Four times a day (QID) | ORAL | 0 refills | Status: DC | PRN

## 2017-04-09 MED ORDER — LEVETIRACETAM 100 MG/ML PO SOLN
1000.0000 mg | Freq: Two times a day (BID) | ORAL | Status: DC
  Administered 2017-04-09: 1000 mg via ORAL
  Filled 2017-04-09 (×2): qty 10

## 2017-04-09 MED ORDER — ACETAMINOPHEN 325 MG PO TABS
325.0000 mg | ORAL_TABLET | ORAL | Status: DC | PRN
  Administered 2017-04-10 – 2017-04-14 (×6): 650 mg via ORAL
  Administered 2017-04-14: 325 mg via ORAL
  Filled 2017-04-09 (×7): qty 2

## 2017-04-09 MED ORDER — METOCLOPRAMIDE HCL 10 MG/10ML PO SOLN
5.0000 mg | Freq: Every day | ORAL | Status: DC
  Filled 2017-04-09: qty 5

## 2017-04-09 MED ORDER — BISACODYL 10 MG RE SUPP: 10.0000 mg | Freq: Every day | RECTAL | Status: DC | PRN

## 2017-04-09 NOTE — Progress Notes (Signed)
Physical Therapy Treatment Patient Details Name: Carly [name] MRN: [id number] DOB: 1971-02-06 Today's Date: 04/09/2017    History of Present Illness Pt is a 45 y/o female with PMH of HTN and migraines. She was admitted on 03/03/2017 with headache and focal seizure activity. CT scan of the head showed subarachnoid hemorrhage with associated IVH in the setting of presumed ruptured aneurysm. Patient developed progressive decrease in level of consciousness, vomited, postured. She was intubated for airway protection and IVC placed per neurosurgery. 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 Positive for active extravasation of hemorrhage. Patient underwent embolization on 03/07/2017.     PT Comments    Pt performed well during tx today and progressing toward goals. Pt has increase core stability and able to perform EOB activities min assist with intermittent UE support. Pt demonstrates an increase in Bil LE strength during static standing intervention and continues to show improvement, however pt still requires mod-max assist +2 to maintain balance. Pt scheduled to transfer to rehab today. Plan of care remains appropriate with focus on next tx to increase Bil LE strength and endurance.   Follow Up Recommendations  CIR     Equipment Recommendations  Wheelchair (measurements PT);Wheelchair cushion (measurements PT);Rolling walker with 5" wheels;3in1 (PT)    Recommendations for Other Services Rehab consult     Precautions / Restrictions Precautions Precautions: Fall Precaution Comments: PEG , abdominal binder Restrictions Weight Bearing Restrictions: No    Mobility  Bed Mobility Overal bed mobility: Needs Assistance Bed Mobility: Supine to Sit;Sit to Sidelying     Supine to sit: Mod assist;+2 for physical assistance;HOB elevated   Sit to sidelying: Mod  assist General bed mobility comments: Pt did require mod assist to elevate upper trunk into sitting position. Pt able to perform sit to sidelying with max cues for hand placemet and initiation and required mod assist with lifting LE's into bed.   Transfers Overall transfer level: Needs assistance Equipment used: Rolling walker (2 wheeled) Transfers: Sit to/from [facility] Sit to Stand: Mod assist;+2 physical assistance;From elevated surface Stand pivot transfers: Mod assist;+2 physical assistance;From elevated surface       General transfer comment: Pt able to perform transfers well with mod assist +2 to RW with max vc's to initiate, and for hand placement. Pt able to transfer from RW to [facility] with min assist +2 but still required max cues for hand placement. When standing, pt's R knee appearing to buckle but pt able to vocalize that she is getting tired prior to sitting EOB. Prior to laying down, pt able to lateral side step to the L toward HOB (~4') with vc's to initiate movement.   Ambulation/Gait                 Stairs            Wheelchair Mobility    Modified Rankin (Stroke Patients Only)       Balance Overall balance assessment: Needs assistance Sitting-balance support: Feet supported;No upper extremity supported Sitting balance-Leahy Scale: Fair Sitting balance - Comments: Pt able to sit EOB with intermitten UE support for ~ 10 min. Pt able to achieve x5 alternating hand reaches within BOS, pt able to maintain sitting posture with minimal swaying posteriorly. Postural control: Posterior lean Standing balance support: Bilateral upper extremity supported Standing balance-Leahy Scale: Poor Standing balance comment: Pt able to static stand at  RW mod asssit +2 for ~ 4 min during first trial prior to sitting at [facility]. Pt required mod vc's to look straight during standing and reponded well. Pt performed ~3 min of standing mod assist +2 during second trial  and required perianal cleaning. Pt was able to achieve L side stepping max assist +2 with max cues to initiate movement for ~ 4' toward [facility].                             Cognition Arousal/Alertness: Awake/alert Behavior During Therapy: WFL for tasks assessed/performed;Flat affect Overall Cognitive Status: Impaired/Different from baseline Area of Impairment: Orientation;Memory;Safety/judgement                 Orientation Level: Disoriented to;Place;Time Current Attention Level: Sustained Memory: Decreased short-term memory Following Commands: Follows one step commands consistently Safety/Judgement: Decreased awareness of deficits   Problem Solving: Decreased initiation;Requires verbal cues;Requires tactile cues;Difficulty sequencing General Comments: Pt appeared to become more responsive, verbally, as tx went on.      Exercises      General Comments        Pertinent Vitals/Pain Pain Assessment: No/denies pain (Pt stated that she feels good today, just tired.)    Home Living                      Prior Function            PT Goals (current goals can now be found in the care plan section) Acute Rehab PT Goals Patient Stated Goal: didn't state PT Goal Formulation: Patient unable to participate in goal setting Potential to Achieve Goals: Fair Progress towards PT goals: Progressing toward goals    Frequency    Min 3X/week      PT Plan Current plan remains appropriate    Co-evaluation PT/OT/SLP Co-Evaluation/Treatment: Yes Reason for Co-Treatment: Necessary to address cognition/behavior during functional activity;For patient/therapist safety PT goals addressed during session: Mobility/safety with mobility;Balance   SLP goals addressed during session: Swallowing;Cognition;Communication    AM-PAC PT "6 Clicks" Daily Activity  Outcome Measure  Difficulty turning over in bed (including adjusting bedclothes, sheets and blankets)?:  Total Difficulty moving from lying on back to sitting on the side of the bed? : Total Difficulty sitting down on and standing up from a chair with arms (e.g., wheelchair, bedside commode, etc,.)?: A Lot Help needed moving to and from a bed to chair (including a wheelchair)?: A Lot Help needed walking in hospital room?: Total Help needed climbing 3-5 steps with a railing? : Total 6 Click Score: 8    End of Session Equipment Utilized During Treatment: Gait belt Activity Tolerance: Patient tolerated treatment well;Patient limited by fatigue Patient left: in bed;with call bell/phone within reach;with family/visitor present Nurse Communication: Mobility status PT Visit Diagnosis: Muscle weakness (generalized) (M62.81);Difficulty in walking, not elsewhere classified (R26.2);Other abnormalities of gait and mobility (R26.89)     Time: 1245-8099 PT Time Calculation (min) (ACUTE ONLY): 47 min  Charges:  $Gait Training: 8-22 mins $Therapeutic Activity: 23-37 mins                    G CodesRandalyn Rhea, [place] 04/09/2017, 3:48 PM

## 2017-04-09 NOTE — PMR Pre-admission (Signed)
PMR Admission Coordinator Pre-Admission Assessment  Patient: Carly [name] is an 46 y.o., female MRN: [id number] DOB: 01-16-1971 Height: 5' 7" (170.2 cm) Weight: 98.2 kg (216 lb 7.9 oz)              Insurance Information HMO:     PPO:  Yes     PCP:       IPA:       80/20:       OTHER:  Group # [id number] PRIMARY:  UHC      Policy#: [id number]      Subscriber:  Shiela Mayer CM Name: Vevelyn Royals      Phone#: [phone]     Fax#: [phone] Pre-Cert#: [id number]      Employer:  [employer] Benefits:  Phone #: [phone]     Name:  Online Eff. Date: 09/16/16     Deduct:  $900 (met $900)      Out of Pocket Max:  $6000 (met [phone])      Life Max: N/A CIR: $150 days 1-5      SNF: $150 days 1-5 Outpatient: 60 visits combined     Co-Pay: $40/visit Home Health: 80% with 120 visits     Co-Pay: 20% DME: 80%     Co-Pay: 20% Providers: in network  Medicaid Application Date:        Case Manager:   Disability Application Date:        Case Worker:    Emergency Facilities manager Information    Name Relation Home Work Dooms Spouse [phone]  [phone]     Current Medical History  Patient Admitting Diagnosis: Left ICA aneurysm with resultant left frontal hemorrhage s/p coiling   History of Present Illness: A 46 y.o.femalewith history of HTN, GERD, migraines who was admitted on 03/03/17 with HA and focal seizure  activity and developed progressive decrease in LOC requiring intubation in ED. 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 Intraventricular catheter placed by Dr. Cyndy Freeze and she underwent cerebral angio with coiling of L-ICA aneurysm by Dr. Kathyrn Sheriff. Hospital course significant for blood drainage from IVC, multiple episodes of self extubation, HCAP, hypotension, lack of responsiveness as well as signs of minimal neurologic  improvement. She was made DNR and family elected extubation with monitoring. EVD clamped with follow up CCT showing marked increase in hydrocephalus but no change in MS therefore discontinued 7/14. Mentation improving and PEG placed for nutritional support by Dr. Hulen Skains on 7/17. Patient with generalized weakness, aphonia, needs tactile and showing increased ability to follow commands. CIR recommended by rehab team.    Total: 15=NIH  Past Medical History  Past Medical History:  Diagnosis Date  . GERD (gastroesophageal reflux disease)   . Hypertension   . Migraines     Family History  family history includes Cancer in her father; Diabetes in her maternal grandmother and mother; Hyperlipidemia in her mother and paternal grandfather; Hypertension in her mother.  Prior Rehab/Hospitalizations: No previous rehab.  Has the patient had major surgery during 100 days prior to admission? No  Current Medications   Current Facility-Administered Medications:  .   stroke: mapping our early stages of recovery book, , Does not apply, Once, Vinie Sill C, NP .  2.48 % sodium chloride infusion, , Intravenous, PRN, Chesley Mires, MD, Last Rate: 10 mL/hr at 03/31/17 1600 .  acetaminophen (TYLENOL) solution 650 mg, 650 mg,  Per Tube, W0J PRN, Pershing Proud, NP, 811 mg at 04/06/17 1823 .  [DISCONTINUED] acetaminophen (TYLENOL) tablet 650 mg, 650 mg, Oral, Q4H PRN **OR** [DISCONTINUED] acetaminophen (TYLENOL) solution 650 mg, 650 mg, Per Tube, Q4H PRN, 650 mg at 03/25/17 0225 **OR** acetaminophen (TYLENOL) suppository 650 mg, 650 mg, Rectal, Q4H PRN, Costella, Vista Mink, PA-C, 650 mg at 03/20/17 2303 .  chlorhexidine (PERIDEX) 0.12 % solution 15 mL, 15 mL, Mouth Rinse, BID, Consuella Lose, MD, 15 mL at 04/09/17 0950 .  feeding supplement (OSMOLITE 1.2 CAL) liquid 1,000 mL, 1,000 mL, Per Tube, Continuous, Rayburn, Floyce Stakes, PA-C, Last Rate: 60 mL/hr at 04/06/17 1838, 1,000 mL at 04/06/17 1838 .  feeding  supplement (PRO-STAT SUGAR FREE 64) liquid 30 mL, 30 mL, Per Tube, Daily, Consuella Lose, MD, 30 mL at 04/09/17 0950 .  HYDROcodone-acetaminophen (HYCET) 7.5-325 mg/15 ml solution 10 mL, 10 mL, Per Tube, Q6H PRN, Izora Gala A, PA-C, 10 mL at 04/06/17 2027 .  labetalol (NORMODYNE,TRANDATE) injection 10 mg, 10 mg, Intravenous, B1Y PRN, Pershing Proud, NP .  levETIRAcetam (KEPPRA) 100 MG/ML solution 1,000 mg, 1,000 mg, Per Tube, BID, Consuella Lose, MD, 1,000 mg at 04/09/17 0951 .  loperamide (IMODIUM) 1 MG/5ML solution 2 mg, 2 mg, Per Tube, PRN, Pershing Proud, NP, 2 mg at 04/02/17 0950 .  LORazepam (ATIVAN) injection 1 mg, 1 mg, Intravenous, N8G PRN, Vinie Sill C, NP .  MEDLINE mouth rinse, 15 mL, Mouth Rinse, q12n4p, Consuella Lose, MD, 15 mL at 04/09/17 1132 .  metoCLOPramide (REGLAN) 10 MG/10ML solution 5 mg, 5 mg, Per Tube, Daily, Meyran, Ocie Cornfield, NP, 5 mg at 04/09/17 1000 .  morphine 2 MG/ML injection 2 mg, 2 mg, Intravenous, Q2H PRN, Earnie Larsson, MD, 2 mg at 04/06/17 0411 .  multivitamin liquid 15 mL, 15 mL, Per Tube, Daily, Vinie Sill C, NP, 15 mL at 04/09/17 0950 .  nystatin (MYCOSTATIN) 100000 UNIT/ML suspension 500,000 Units, 5 mL, Oral, QID, Meyran, Ocie Cornfield, NP, 500,000 Units at 04/09/17 0950 .  pantoprazole sodium (PROTONIX) 40 mg/20 mL oral suspension 40 mg, 40 mg, Per Tube, Daily, Pershing Proud, NP, 40 mg at 04/09/17 0950  Patients Current Diet: Diet regular Room service appropriate? Yes; Fluid consistency: Thin  Precautions / Restrictions Precautions Precautions: Fall Precaution Comments: PEG , abdominal binder Restrictions Weight Bearing Restrictions: No   Has the patient had 2 or more falls or a fall with injury in the past year?No  Prior Activity Level Community (5-7x/wk): Worked FT in [employer], was driving.  Home Assistive Devices / Equipment Home Assistive Devices/Equipment: None  Prior Device Use: Indicate  devices/aids used by the patient prior to current illness, exacerbation or injury? None  Prior Functional Level Prior Function Level of Independence: Independent Comments: per pt report she was indpendent  Self Care: Did the patient need help bathing, dressing, using the toilet or eating?  Independent  Indoor Mobility: Did the patient need assistance with walking from room to room (with or without device)? Independent  Stairs: Did the patient need assistance with internal or external stairs (with or without device)? Independent  Functional Cognition: Did the patient need help planning regular tasks such as shopping or remembering to take medications? Independent  Current Functional Level Cognition  Arousal/Alertness: Awake/alert Overall Cognitive Status: Impaired/Different from baseline Current Attention Level: Sustained Orientation Level: Oriented to person, Oriented to place, Disoriented to time, Disoriented to situation Following Commands: Follows one step commands consistently Safety/Judgement: Decreased awareness of  deficits General Comments: pt wanting bright light off, but did not initiate calling for help Attention: Sustained Sustained Attention: Impaired Sustained Attention Impairment: Verbal basic, Functional basic Comments: slow processing    Extremity Assessment (includes Sensation/Coordination)  Upper Extremity Assessment: RUE deficits/detail, LUE deficits/detail RUE Deficits / Details: 3/5 shoulder, elbow, 4-/5 gross graspt RUE Sensation: decreased light touch RUE Coordination: decreased fine motor, decreased gross motor LUE Deficits / Details: 3-/5 shoulder, elbow to hand 3/5 LUE Sensation: decreased light touch LUE Coordination: decreased fine motor, decreased gross motor  Lower Extremity Assessment: Defer to PT evaluation RLE Deficits / Details: attempted to wiggle toes, otherwise 0/5 RLE Sensation: decreased light touch LLE Deficits / Details: grossly  0/5 LLE Sensation: decreased light touch    ADLs  Overall ADL's : Needs assistance/impaired General ADL Comments: Pt with improved functional use of UEs.    Mobility  Overal bed mobility: Needs Assistance Bed Mobility: Supine to Sit, Sit to Supine Rolling: Min assist Sidelying to sit: Mod assist, +2 for safety/equipment Supine to sit: Mod assist Sit to supine: Mod assist General bed mobility comments: cues for technique, assist for L LE over EOB and to raise trunk, assist for LEs back into bed    Transfers  Overall transfer level: Needs assistance Transfers: Sit to/from Stand, Stand Pivot Transfers Sit to Stand: Mod assist, +2 physical assistance, From elevated surface Stand pivot transfers: Mod assist, +2 physical assistance, From elevated surface Squat pivot transfers: Max assist, Mod assist, +2 physical assistance  Lateral/Scoot Transfers: Max assist, +2 physical assistance, From elevated surface General transfer comment: Pt able to stand with assist to rise and for anterior translation. Pt with good static standing balance with arm on P.T. arm. Pt able to step with bil UE support from bed to chair without buckling. Cues to step fully to chair prior to sitting. min assist for reciprocal scooting to EOB and back in chair with pad    Ambulation / Gait / Stairs / Wheelchair Mobility  Ambulation/Gait General Gait Details: unable    Posture / Balance Dynamic Sitting Balance Sitting balance - Comments: requires B UE support, sat 8 minutes Balance Overall balance assessment: Needs assistance Sitting-balance support: Feet supported, Bilateral upper extremity supported Sitting balance-Leahy Scale: Poor Sitting balance - Comments: requires B UE support, sat 8 minutes Postural control: Posterior lean, Left lateral lean Standing balance-Leahy Scale: Poor Standing balance comment: midline posture with bil UE support    Special needs/care consideration BiPAP/CPAP No CPM No Continuous  Drip IV KVO Dialysis No       Life Vest No Oxygen Room air Special Bed No Trach Size No Wound Vac (area) No     Skin  No                             Bowel mgmt: Last BM 04/08/17, incontinence Bladder mgmt: External catheter, incontinence Diabetic mgmt No    Previous Home Environment Living Arrangements: Spouse/significant other Home Care Services: No Additional Comments: no family present, pt unable to communicate PLOF  Discharge Living Setting Plans for Discharge Living Setting: Patient's home, House, Lives with (comment) (Lives with husband and daughter.) Type of Home at Discharge: House Discharge Home Layout: One level Discharge Home Access: Stairs to enter Entrance Stairs-Number of Steps: 3 steps Does the patient have any problems obtaining your medications?: No  Social/Family/Support Systems Patient Roles: Spouse, Parent (Has a husband and a 78 yo daughter.) Contact Information: Blue Ruggerio -  husband - [phone] Anticipated Caregiver: Husband and may have to hire assistance. Ability/Limitations of Caregiver: Husband works as a Administrator. Caregiver Availability: Other (Comment) (Husband is aware of the need for assistance after rehab.) Discharge Plan Discussed with Primary Caregiver: Yes Is Caregiver In Agreement with Plan?: Yes Does Caregiver/Family have Issues with Lodging/Transportation while Pt is in Rehab?: No  Goals/Additional Needs Patient/Family Goal for Rehab: PT/OT min assist, SLP supervision to min assist goals Expected length of stay: 16-20 days Cultural Considerations: None Dietary Needs: Regular diet, thin liquids Equipment Needs: TBD Pt/Family Agrees to Admission and willing to participate: Yes Program Orientation Provided & Reviewed with Pt/Caregiver Including Roles  & Responsibilities: Yes  Decrease burden of Care through IP rehab admission: N/A  Possible need for SNF placement upon discharge: Yes, likely will need SNF after inpatient rehab  admission as husband works.  Patient Condition: This patient's medical and functional status has changed since the consult dated: 03/31/17 in which the Rehabilitation Physician determined and documented that the patient's condition is appropriate for intensive rehabilitative care in an inpatient rehabilitation facility. See "History of Present Illness" (above) for medical update. Functional changes are: Currently requiring mod assist for transfers. Patient's medical and functional status update has been discussed with the Rehabilitation physician and patient remains appropriate for inpatient rehabilitation. Will admit to inpatient rehab today.  Preadmission Screen Completed By:  [name], 04/09/2017 12:26 PM ______________________________________________________________________   Discussed status with Dr. Posey Pronto on 04/09/17 at 81 and received telephone approval for admission today.  Admission Coordinator:  [name], time 1226/Date 04/09/17

## 2017-04-09 NOTE — H&P (Signed)
Physical Medicine and Rehabilitation Admission H&P    Chief Complaint  Patient presents with  . Generalized weakness, dysphagia, visual and cognitive deficits.      HPI:  Carly [name] is a 46 y.o. right female with history of HTN, GERD, migraines who was admitted on 03/03/17 with HA and focal seizure activity and developed progressive decrease in LOC requiring intubation in ED. History taken from chart review and family.  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 Intraventricular catheter placed by Dr. Cyndy Freeze and she underwent cerebral angio with coiling of L-ICA aneurysm by Dr. Kathyrn Sheriff.  Hospital course significant for blood drainage from IVC, multiple episodes of self extubation, HCAP, hypotension, lack of responsiveness as well as signs of minimal neurologic improvement.   She was made DNR and family elected extubation with monitoring. EVD clamped with follow up Cranial CT, reviewed, showing increase in hydrocephalus.  Per report, no change in MS therefore discontinued 7/14. Mentation improving and PEG placed for nutritional support by Dr. Hulen Skains on 7/17. Hospital course complicated by tachypnea, tachycardia, ABLA.  As lethargy resolved, she was started on regular diet yesterday. Po intake poor and requiring assistance with meals. she continues to have diffuse weakness with delayed processing, significant visual deficits and significant deficits in mobility. CIR recommended by rehab team.    Review of Systems  HENT: Negative for hearing loss and tinnitus.   Eyes: Positive for blurred vision. Negative for double vision.  Respiratory: Negative for cough and shortness of breath.   Cardiovascular: Negative for chest pain and palpitations.  Gastrointestinal: Positive for abdominal pain (a little sore). Negative for heartburn and nausea.  Genitourinary:  Negative for dysuria, frequency and urgency.  Musculoskeletal: Negative for back pain, myalgias and neck pain.  Skin: Negative for itching and rash.  Neurological: Positive for sensory change (funny sensations around right forehaed/face with), speech change, weakness and headaches ( HA on and off.--bad at times). Negative for dizziness.  Psychiatric/Behavioral: The patient has insomnia.   All other systems reviewed and are negative.     Past Medical History:  Diagnosis Date  . GERD (gastroesophageal reflux disease)   . Hypertension   . Migraines     Past Surgical History:  Procedure Laterality Date  . CESAREAN SECTION    . ESOPHAGOGASTRODUODENOSCOPY N/A 04/01/2017   Procedure: ESOPHAGOGASTRODUODENOSCOPY (EGD);  Surgeon: Georganna Skeans, MD;  Location: [place];  Service: General;  Laterality: N/A;  . IR ANGIO INTRA EXTRACRAN SEL INTERNAL CAROTID UNI R MOD SED  03/03/2017  . IR ANGIO VERTEBRAL SEL VERTEBRAL BILAT MOD SED  03/03/2017  . IR ANGIOGRAM FOLLOW UP STUDY  03/03/2017  . IR ANGIOGRAM FOLLOW UP STUDY  03/03/2017  . IR ANGIOGRAM FOLLOW UP STUDY  03/03/2017  . IR ANGIOGRAM FOLLOW UP STUDY  03/03/2017  . IR TRANSCATH/EMBOLIZ  03/03/2017  . PEG PLACEMENT N/A 04/01/2017   Procedure: PERCUTANEOUS ENDOSCOPIC GASTROSTOMY (PEG) PLACEMENT;  Surgeon: Georganna Skeans, MD;  Location: [place];  Service: General;  Laterality: N/A;  . RADIOLOGY WITH ANESTHESIA N/A 03/03/2017   Procedure: RADIOLOGY WITH ANESTHESIA;  Surgeon: Consuella Lose, MD;  Location: [place];  Service: Radiology;  Laterality: N/A;    Family History  Problem Relation Age of Onset  . Diabetes Mother   . Hypertension Mother   . Hyperlipidemia Mother   . Cancer Father        colon cancer  . Diabetes  Maternal Grandmother   . 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 She  reports that she has never smoked. She has  never used smokeless tobacco. Per reports that she drinks alcohol.  She  reports that she does not use drugs.    Allergies: No Known Allergies    Medications Prior to Admission  Medication Sig Dispense Refill  . [START ON 04/10/2017] Amino Acids-Protein Hydrolys (FEEDING SUPPLEMENT, PRO-STAT SUGAR FREE 64,) LIQD Place 30 mLs into feeding tube daily. 900 mL 0  . butalbital-acetaminophen-caffeine (FIORICET WITH CODEINE) 50-325-40-30 MG capsule Take 1 capsule by mouth every 4 (four) hours as needed for headache. 30 capsule 0  . cyclobenzaprine (FLEXERIL) 10 MG tablet Take 10 mg by mouth at bedtime as needed for muscle spasms.    Marland Kitchen HYDROcodone-acetaminophen (HYCET) 7.5-325 mg/15 ml solution Place 10 mLs into feeding tube every 6 (six) hours as needed for moderate pain or severe pain. 120 mL 0  . Ibuprofen (MIDOL) 200 MG CAPS Take 1-2 capsules by mouth daily as needed.    . levETIRAcetam (KEPPRA) 100 MG/ML solution Place 10 mLs (1,000 mg total) into feeding tube 2 (two) times daily. 473 mL 12  . Nutritional Supplements (FEEDING SUPPLEMENT, OSMOLITE 1.2 CAL,) LIQD Place 1,000 mLs into feeding tube continuous. 1000 mL 0  . triamterene-hydrochlorothiazide (MAXZIDE-25) 37.5-25 MG per tablet Take 1 tablet by mouth daily. 90 tablet 3    Home: Home Living Family/patient expects to be discharged to:: Private residence Living Arrangements: Spouse/significant other Additional Comments: no family present, pt unable to communicate PLOF   Functional History: Prior Function Level of Independence: Independent Comments: per pt report she was indpendent  Functional Status:  Mobility: Bed Mobility Overal bed mobility: Needs Assistance Bed Mobility: Supine to Sit, Sit to Sidelying Rolling: Min assist Sidelying to sit: Mod assist, +2 for safety/equipment Supine to sit: Mod assist, +2 for physical assistance, HOB elevated Sit to supine: Mod assist Sit to sidelying: Mod assist General bed mobility  comments: Pt did require mod assist to elevate upper trunk into sitting position. Pt able to perform sit to sidelying with max cues for hand placemet and initiation and required mod assist with lifting LE's into bed.  Transfers Overall transfer level: Needs assistance Equipment used: Rolling walker (2 wheeled) Transfers: Sit to/from Stand, [employer] Transfers Sit to Stand: Mod assist, +2 physical assistance, From elevated surface Stand pivot transfers: Mod assist, +2 physical assistance, From elevated surface Squat pivot transfers: Max assist, Mod assist, +2 physical assistance  Lateral/Scoot Transfers: Max assist, +2 physical assistance, From elevated surface General transfer comment: Pt able to perform transfers well with mod assist +2 to RW with max vc's to initiate, and for hand placement. Pt able to transfer from RW to [facility] with min assist +2 but still required max cues for hand placement. When standing, pt's R knee appearing to buckle but pt able to vocalize that she is getting tired prior to sitting EOB. Prior to sitting, pt able to lateral side step to the L toward HOB (~4') with vc's to initiate movement.  Ambulation/Gait General Gait Details: unable    ADL: ADL Overall ADL's : Needs assistance/impaired General ADL Comments: Pt with improved functional use of UEs.  Cognition: Cognition Overall Cognitive Status: Impaired/Different from baseline Arousal/Alertness: Awake/alert Orientation Level: Oriented to person, Oriented to place, Disoriented to time, Disoriented to situation Attention: Sustained Sustained Attention: Impaired Sustained Attention Impairment:  Verbal basic, Functional basic Comments: slow processing Cognition Arousal/Alertness: Awake/alert Behavior During Therapy: WFL for tasks assessed/performed, Flat affect Overall Cognitive Status: Impaired/Different from baseline Area of Impairment: Orientation, Memory, Safety/judgement Orientation Level: Disoriented to,  Place, Time Current Attention Level: Sustained Memory: Decreased short-term memory Following Commands: Follows one step commands consistently Safety/Judgement: Decreased awareness of deficits Problem Solving: Decreased initiation, Requires verbal cues, Requires tactile cues, Difficulty sequencing General Comments: Pt appeared to become more responsive, verbally, as tx went on.   Blood pressure (!) 107/58, pulse 90, temperature 99.1 F (37.3 C), temperature source Oral, resp. rate (!) 22, height 5\' 7"  (1.702 m), weight 98.2 kg (216 lb 7.9 oz), SpO2 95 %. Physical Exam  Nursing note and vitals reviewed. Constitutional: She appears well-developed.  Obese  HENT:  Head: Normocephalic and atraumatic.  Mouth/Throat: Oropharynx is clear and moist.  Eyes: Conjunctivae are normal. Right eye exhibits no discharge. Left eye exhibits no discharge.  Pupils dilated but react equally.   Neck: Normal range of motion. Neck supple.  No pain with ROM  Cardiovascular: Normal rate and regular rhythm.   Respiratory: Effort normal and breath sounds normal. No stridor. No respiratory distress. She has no wheezes.  GI: Soft. Bowel sounds are normal. She exhibits no distension. There is no tenderness.  PEG site with minimal tenderness--dressing under flange  Musculoskeletal: She exhibits no edema or tenderness.  Neurological: She is alert.  Right gaze preference --unable to move eyes beyond midline to left field.   Rapid fluttering eye movements noted when head turned to left.  Soft voice with one word answers mostly.  Oriented to self and place as medical center.  Motor: B/l UE 4-/5 proximal to distal RLE: 3+/5 HF, KE, 4+/5 ADF/PF LLE: 4-/5 HF, KE, 4+/5 ADF/PF Left sensory deficits  Intentional tremors LUE/LLE.   Skin: Skin is warm and dry. She is not diaphoretic.  Psychiatric: Her affect is blunt. Her speech is delayed. She is slowed. She does not express impulsivity. She is attentive.    Results for  orders placed or performed during the hospital encounter of 03/03/17 (from the past 48 hour(s))  Glucose, capillary     Status: Abnormal   Collection Time: 04/07/17 11:12 PM  Result Value Ref Range   Glucose-Capillary 114 (H) 65 - 99 mg/dL  Glucose, capillary     Status: Abnormal   Collection Time: 04/08/17  3:43 AM  Result Value Ref Range   Glucose-Capillary 150 (H) 65 - 99 mg/dL  Glucose, capillary     Status: Abnormal   Collection Time: 04/08/17  7:20 AM  Result Value Ref Range   Glucose-Capillary 143 (H) 65 - 99 mg/dL  Glucose, capillary     Status: Abnormal   Collection Time: 04/08/17 11:59 AM  Result Value Ref Range   Glucose-Capillary 129 (H) 65 - 99 mg/dL   Comment 1 Notify RN   Glucose, capillary     Status: Abnormal   Collection Time: 04/08/17  3:23 PM  Result Value Ref Range   Glucose-Capillary 128 (H) 65 - 99 mg/dL  Glucose, capillary     Status: Abnormal   Collection Time: 04/08/17  8:04 PM  Result Value Ref Range   Glucose-Capillary 154 (H) 65 - 99 mg/dL  Glucose, capillary     Status: Abnormal   Collection Time: 04/08/17 11:21 PM  Result Value Ref Range   Glucose-Capillary 123 (H) 65 - 99 mg/dL  Glucose, capillary     Status: Abnormal   Collection Time: 04/09/17  4:37 AM  Result Value Ref Range   Glucose-Capillary 129 (H) 65 - 99 mg/dL  Glucose, capillary     Status: Abnormal   Collection Time: 04/09/17  7:55 AM  Result Value Ref Range   Glucose-Capillary 150 (H) 65 - 99 mg/dL   Comment 1 Notify RN    Comment 2 Document in Chart   Glucose, capillary     Status: Abnormal   Collection Time: 04/09/17 11:41 AM  Result Value Ref Range   Glucose-Capillary 147 (H) 65 - 99 mg/dL   Comment 1 Notify RN    Comment 2 Document in Chart    Dg Swallowing Func-speech Pathology  Result Date: 04/08/2017 Objective Swallowing Evaluation: Type of Study: MBS-Modified Barium Swallow Study Patient Details Name: ANYAH SWALLOW MRN: [id number] Date of Birth: 17-Nov-1970 Today's  Date: 04/08/2017 Time: SLP Start Time (ACUTE ONLY): 1430-SLP Stop Time (ACUTE ONLY): 1500 SLP Time Calculation (min) (ACUTE ONLY): 30 min Past Medical History: Past Medical History: Diagnosis Date . GERD (gastroesophageal reflux disease)  . Hypertension  . Migraines  Past Surgical History: Past Surgical History: Procedure Laterality Date . CESAREAN SECTION   . ESOPHAGOGASTRODUODENOSCOPY N/A 04/01/2017  Procedure: ESOPHAGOGASTRODUODENOSCOPY (EGD);  Surgeon: Georganna Skeans, MD;  Location: [place];  Service: General;  Laterality: N/A; . IR ANGIO INTRA EXTRACRAN SEL INTERNAL CAROTID UNI R MOD SED  03/03/2017 . IR ANGIO VERTEBRAL SEL VERTEBRAL BILAT MOD SED  03/03/2017 . IR ANGIOGRAM FOLLOW UP STUDY  03/03/2017 . IR ANGIOGRAM FOLLOW UP STUDY  03/03/2017 . IR ANGIOGRAM FOLLOW UP STUDY  03/03/2017 . IR ANGIOGRAM FOLLOW UP STUDY  03/03/2017 . IR TRANSCATH/EMBOLIZ  03/03/2017 . PEG PLACEMENT N/A 04/01/2017  Procedure: PERCUTANEOUS ENDOSCOPIC GASTROSTOMY (PEG) PLACEMENT;  Surgeon: Georganna Skeans, MD;  Location: [place];  Service: General;  Laterality: N/A; . RADIOLOGY WITH ANESTHESIA N/A 03/03/2017  Procedure: RADIOLOGY WITH ANESTHESIA;  Surgeon: Consuella Lose, MD;  Location: [place];  Service: Radiology;  Laterality: N/A; HPI: 46 y.o. female  with past medical history of Hypertension, GERD, migraine headaches admitted on 03/03/2017 with headache and focal seizure activity. CT scan of the head showed subarachnoid hemorrhage with associated IVH in the setting of presumed ruptured aneurysm. Patient developed progressive decrease in level of consciousness, vomited, postured. She was intubated for airway protection and IVC placed per neurosurgery. 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 Positive for active extravasation of hemorrhage. Patient underwent embolization on 03/07/2017.  Subjective:  alert, communicative Assessment / Plan / Recommendation CHL IP CLINICAL IMPRESSIONS 04/08/2017 Clinical Impression Pt presents with a normal oropharyngeal swallow. There is adequate mastication; strong propulsion of materials through pharynx; no residue post-swallow; no penetration nor aspiration.  Pt is safe to resume a regular consistency diet, thin liquids, meds whole with liquids.  Results/recs reviewed with Mr. and Mrs. Zollner, both of whom were very pleased with results.  Rec f/u re: ceasing enteral feedings when appropriate.  SLP Visit Diagnosis Dysphagia, unspecified (R13.10) Attention and concentration deficit following -- Frontal lobe and executive function deficit following -- Impact on safety and function No limitations   CHL IP TREATMENT RECOMMENDATION 03/27/2017 Treatment Recommendations Therapy as outlined in treatment plan below   Prognosis 03/27/2017 Prognosis for Safe Diet Advancement Good Barriers to Reach Goals Cognitive deficits;Language deficits Barriers/Prognosis Comment -- CHL IP DIET RECOMMENDATION 04/08/2017 SLP Diet Recommendations Regular solids;Thin liquid Liquid Administration via Cup;Straw Medication Administration Whole meds with  liquid Compensations Minimize environmental distractions Postural Changes --   CHL IP OTHER RECOMMENDATIONS 04/08/2017 Recommended Consults -- Oral Care Recommendations Oral care BID Other Recommendations --   CHL IP FOLLOW UP RECOMMENDATIONS 04/03/2017 Follow up Recommendations Inpatient Rehab;Skilled Nursing facility   [facility] IP FREQUENCY AND DURATION 03/27/2017 Speech Therapy Frequency (ACUTE ONLY) min 2x/week Treatment Duration --      CHL IP ORAL PHASE 04/08/2017 Oral Phase WFL Oral - Pudding Teaspoon -- Oral - Pudding Cup -- Oral - Honey Teaspoon -- Oral - Honey Cup -- Oral - Nectar Teaspoon -- Oral - Nectar Cup -- Oral - Nectar Straw -- Oral - Thin Teaspoon -- Oral - Thin Cup -- Oral - Thin Straw -- Oral - Puree -- Oral - Mech Soft -- Oral - Regular -- Oral -  Multi-Consistency -- Oral - Pill -- Oral Phase - Comment --  CHL IP PHARYNGEAL PHASE 04/08/2017 Pharyngeal Phase WFL Pharyngeal- Pudding Teaspoon -- Pharyngeal -- Pharyngeal- Pudding Cup -- Pharyngeal -- Pharyngeal- Honey Teaspoon -- Pharyngeal -- Pharyngeal- Honey Cup -- Pharyngeal -- Pharyngeal- Nectar Teaspoon -- Pharyngeal -- Pharyngeal- Nectar Cup -- Pharyngeal -- Pharyngeal- Nectar Straw -- Pharyngeal -- Pharyngeal- Thin Teaspoon -- Pharyngeal -- Pharyngeal- Thin Cup -- Pharyngeal -- Pharyngeal- Thin Straw -- Pharyngeal -- Pharyngeal- Puree -- Pharyngeal -- Pharyngeal- Mechanical Soft -- Pharyngeal -- Pharyngeal- Regular -- Pharyngeal -- Pharyngeal- Multi-consistency -- Pharyngeal -- Pharyngeal- Pill -- Pharyngeal -- Pharyngeal Comment --  CHL IP CERVICAL ESOPHAGEAL PHASE 04/08/2017 Cervical Esophageal Phase WFL Pudding Teaspoon -- Pudding Cup -- Honey Teaspoon -- Honey Cup -- Nectar Teaspoon -- Nectar Cup -- Nectar Straw -- Thin Teaspoon -- Thin Cup -- Thin Straw -- Puree -- Mechanical Soft -- Regular -- Multi-consistency -- Pill -- Cervical Esophageal Comment -- No flowsheet data found. [name] 04/08/2017, 3:21 PM                  Medical Problem List and Plan: 1.  Generalized weakness, aphonia, needs tactile and showing increased ability to follow commands.   secondary to left frontal hemorrhage s/p coiling. 2.  DVT Prophylaxis/Anticoagulation: Mechanical: Sequential compression devices, below knee Bilateral lower extremities 3. H/o migraines/Pain Management: tylenol prn for HA 4. Mood: lacks insight/awareness of deficits. LCSW to follow for evaluation and support when appropriate. Will 5. Neuropsych: This patient is not capable of making decisions on her own behalf. 6. Skin/Wound Care: routine pressure relief  measures 7. Fluids/Electrolytes/Nutrition: Intake poor--needs to be feed due to poor initiation. Will start calorie count. May need to start tube feeds.  8. Prediabetes:  Hgb A1c- 5.9. Will continue to monitor BS ac/hs for now 9. ABLA: Will recheck in am for follow up.  10. Electrolyte abnormality:  Recheck in am--no labs since extubation.   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    Post Admission Physician Evaluation: 1. Preadmission assessment reviewed and changes made below. 2. Functional deficits secondary  to left frontal hemorrhage s/p coiling. 3. Patient is admitted to receive collaborative, interdisciplinary care between the physiatrist, rehab nursing staff, and therapy team. 4. Patient's level of medical complexity and substantial therapy needs in context of that medical necessity cannot be provided at a lesser intensity of care such as a SNF. 5. Patient has experienced substantial functional loss from his/her baseline which was documented above under the "Functional History" and "Functional Status" headings.  Judging by the patient's diagnosis, physical exam, and functional history, the patient has potential for functional progress which will result  in measurable gains while on inpatient rehab.  These gains will be of substantial and practical use upon discharge  in facilitating mobility and self-care at the household level. 30. Physiatrist will provide 24 hour management of medical needs as well as oversight of the therapy plan/treatment and provide guidance as appropriate regarding the interaction of the two. 7. 24 hour rehab nursing will assist with bladder management, bowel management, safety, skin/wound care, disease management, medication administration, pain management and patient education  and help integrate therapy concepts, techniques,education, etc. 8. PT will assess and treat for/with: Lower extremity strength, range of motion, stamina, balance, functional mobility, safety, adaptive techniques and equipment, woundcare, coping skills, pain control, stroke education.   Goals are: Min A. 9. OT will assess and  treat for/with: ADL's, functional mobility, safety, upper extremity strength, adaptive techniques and equipment, wound mgt, ego support, and community reintegration.   Goals are: Min A. Therapy may proceed with showering this patient. 10. SLP will assess and treat for/with: cognition.  Goals are: Min A. 11. Case Management and Social Worker will assess and treat for psychological issues and discharge planning. 12. Team conference will be held weekly to assess progress toward goals and to determine barriers to discharge. 13. Patient will receive at least 3 hours of therapy per day at least 5 days per week. 14. ELOS: 16-19 days.       15. Prognosis:  good and fair  Delice Lesch, MD, [facility] Rmani Kapusta Lorie Phenix, MD 04/09/2017

## 2017-04-09 NOTE — Progress Notes (Signed)
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 Kathyrn Sheriff   HPI: Carly [name] is a 46 y.o. female with history of HTN, GERD, migraines who was admitted on 03/03/17 with HA and focal seizure activity and developed progressive decrease in LOC requiring intubation in ED. 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 Intraventricular catheter placed by Dr. Cyndy Freeze and she underwent cerebral angio with coiling of L-ICA aneurysm by Dr. Kathyrn Sheriff.  Hospital course significant for blood drainage from IVC, multiple episodes of self extubation, HCAP, hypotension, lack of responsiveness as well as signs of minimal neurologic improvement.   She was made DNR and family elected extubation with monitoring. EVD clamped with follow up CCT showing marked increase in hydrocephalus but no change in MS therefore discontinued 7/14. Therapy ongoing and she is showing improvement in ability to communicate and ability to sit at EOB. CIR recommended by rehab team.    Review of Systems  Unable to perform ROS: Patient nonverbal          Past Medical History:  Diagnosis Date  . GERD (gastroesophageal reflux disease)   . Hypertension   . Migraines         Past Surgical History:  Procedure Laterality Date  . CESAREAN SECTION    . IR ANGIO INTRA EXTRACRAN SEL INTERNAL CAROTID UNI R MOD SED  03/03/2017  . IR ANGIO VERTEBRAL SEL VERTEBRAL BILAT MOD SED  03/03/2017  . IR ANGIOGRAM  FOLLOW UP STUDY  03/03/2017  . IR ANGIOGRAM FOLLOW UP STUDY  03/03/2017  . IR ANGIOGRAM FOLLOW UP STUDY  03/03/2017  . IR ANGIOGRAM FOLLOW UP STUDY  03/03/2017  . IR TRANSCATH/EMBOLIZ  03/03/2017  . RADIOLOGY WITH ANESTHESIA N/A 03/03/2017   Procedure: RADIOLOGY WITH ANESTHESIA;  Surgeon: Consuella Lose, MD;  Location: [facility];  Service: Radiology;  Laterality: N/A;         Family History  Problem Relation Age of Onset  . Diabetes Mother   . Hypertension Mother   . Hyperlipidemia Mother   . Cancer Father        colon cancer  . Diabetes Maternal Grandmother   . Hyperlipidemia Paternal Grandfather     Social History:  Married. Independent PTA? Per reports that she has never smoked. She has never used smokeless tobacco. Per reports that she drinks alcohol.  Per reports that she does not use drugs.    Allergies: No Known Allergies          Medications Prior to Admission  Medication Sig Dispense Refill  . butalbital-acetaminophen-caffeine (FIORICET WITH CODEINE) 50-325-40-30 MG capsule Take 1 capsule by mouth every 4 (four) hours as needed for headache. 30 capsule 0  . cyclobenzaprine (FLEXERIL) 10 MG tablet Take 10 mg by mouth at bedtime as needed for muscle spasms.    . Ibuprofen (MIDOL) 200 MG CAPS Take 1-2 capsules by mouth daily as needed.    . triamterene-hydrochlorothiazide (MAXZIDE-25) 37.5-25 MG per tablet Take 1 tablet by mouth daily. 90 tablet 3    Home: Home Living  Family/patient expects to be discharged to:: Private residence Living Arrangements: Spouse/significant other Additional Comments: no family present, pt unable to communicate PLOF  Functional History: Prior Function Level of Independence: Independent Comments: per chart pt indep Functional Status:  Mobility: Bed Mobility Overal bed mobility: Needs Assistance Bed Mobility: Supine to Sit, Sit to Supine, Rolling Rolling: Max assist, +2 for physical assistance, +2 for  safety/equipment Supine to sit: Max assist, +2 for physical assistance, +2 for safety/equipment Sit to supine: Max assist, +2 for physical assistance, +2 for safety/equipment General bed mobility comments: pt unable to facilitate any part of transfer. pt dependent for LE and trunk management Transfers General transfer comment: unable Ambulation/Gait General Gait Details: unable  ADL:  Cognition: Cognition Overall Cognitive Status: Impaired/Different from baseline Arousal/Alertness: Awake/alert Orientation Level: Oriented to person Attention: Sustained Sustained Attention: Impaired Sustained Attention Impairment: Verbal basic, Functional basic Comments: slow processing Cognition Arousal/Alertness: Awake/alert Behavior During Therapy: Flat affect Overall Cognitive Status: Impaired/Different from baseline Area of Impairment: Following commands, Attention, Problem solving Current Attention Level: Focused Following Commands: Follows one step commands with increased time, Follows one step commands inconsistently Problem Solving: Slow processing, Decreased initiation, Difficulty sequencing, Requires verbal cues, Requires tactile cues General Comments: pt non verbal and unable to answer questions, pt shook head yes/no to simple questions but not consistently  Blood pressure 117/68, pulse 74, temperature 99.1 F (37.3 C), temperature source Oral, resp. rate 18, height 5\' 7"  (1.702 m), weight 98.5 kg (217 lb 2.5 oz), SpO2 100 %. Physical Exam  Nursing note and vitals reviewed. Constitutional: She appears well-developed and well-nourished. She appears lethargic. Nasal cannula in place.  Obese female with [place] in nares.   HENT:  Head: Normocephalic and atraumatic.  Eyes: Pupils are equal, round, and reactive to light. Conjunctivae are normal.  Neck:  Left inattention with decreased ROM to left.   Cardiovascular: Normal rate and regular rhythm.   Respiratory: Effort normal and  breath sounds normal. No stridor. No respiratory distress. She has no wheezes.  GI: Soft. Bowel sounds are normal. She exhibits no distension. There is no tenderness.  Rectal tube in place.   Genitourinary:  Genitourinary Comments: Foley in place.   Neurological: She appears lethargic. A cranial nerve deficit is present.  Lethargic with extremely slow movements. She was unable to move eyes beyond midline from left field. Non verbal and unable to open mouth or make any sounds--- apraxic? Marland Kitchen Able to nod yes to most basic orientation questions appropriately. LUE and LLE 1-2/5 grossly. RUE and RLE tr -1/5. Doesn't withdrawal to pinch.   Skin: Skin is warm and dry.    Lab Results Last 24 Hours       Results for orders placed or performed during the hospital encounter of 03/03/17 (from the past 24 hour(s))  Glucose, capillary     Status: Abnormal   Collection Time: 03/30/17 11:39 AM  Result Value Ref Range   Glucose-Capillary 129 (H) 65 - 99 mg/dL  Glucose, capillary     Status: None   Collection Time: 03/30/17  3:24 PM  Result Value Ref Range   Glucose-Capillary 97 65 - 99 mg/dL  Glucose, capillary     Status: Abnormal   Collection Time: 03/30/17  7:22 PM  Result Value Ref Range   Glucose-Capillary 102 (H) 65 - 99 mg/dL  Glucose, capillary     Status: Abnormal   Collection Time: 03/30/17 11:13 PM  Result Value Ref Range   Glucose-Capillary 102 (H) 65 - 99 mg/dL  Glucose, capillary     Status: Abnormal   Collection Time: 03/31/17  4:14 AM  Result Value Ref Range   Glucose-Capillary 102 (H) 65 - 99 mg/dL  Glucose, capillary     Status: Abnormal   Collection Time: 03/31/17  7:26 AM  Result Value Ref Range   Glucose-Capillary 104 (H) 65 - 99 mg/dL      Imaging Results (Last 48 hours)  Ct Head Wo Contrast  Result Date: 03/29/2017 CLINICAL DATA:  Post aneurysmal hemorrhage hydrocephalus. Assess for increase in ventricular size following extraventricular drain clamping x  48 hours. EXAM: CT HEAD WITHOUT CONTRAST TECHNIQUE: Contiguous axial images were obtained from the base of the skull through the vertex without intravenous contrast. COMPARISON:  The most recent CT scan was performed 03/07/2017. FINDINGS: Brain: Marked increase in hydrocephalus, compared to the previous exam. Biventricular diameter was 32 mm on 03/07/2017. Biventricular diameter today is 39 mm. The temporal horns are also increased in size, a reliable indicator of hydrocephalus. Third ventricle and fourth ventricle enlargement is also seen. Hounsfield artifact from aneurysm coil mass. Chronic LEFT frontal infarction. Extraventricular drain catheter passes through the ventricles, and lies in the LEFT thalamus. Vascular: No hyperdense vessel or unexpected calcification. Skull: Normal. Negative for fracture or focal lesion. Sinuses/Orbits: No acute finding. Other: None. IMPRESSION: Marked increase in hydrocephalus compared to the previous exam. Biventricular diameter increased 7 mm compared with most recent priors. See discussion above. Electronically Signed   By: Staci Righter M.D.   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 Does the need for close, 24 hr/day medical supervision in concert with the patient's rehab needs make it unreasonable for this patient to be served in a less intensive setting? Yes 2. Co-Morbidities requiring supervision/potential complications: htn, obesity,  3. Due to bladder management, bowel management, safety, skin/wound care, disease management, medication administration, pain management and patient education, does the patient require 24 hr/day rehab nursing? Yes 4. Does the patient require coordinated care of a physician, rehab nurse, PT (1-2 hrs/day, 5 days/week), OT (1-2 hrs/day, 5 days/week) and SLP (1-2 hrs/day, 5 days/week) to address physical and functional deficits in the context of the above  medical diagnosis(es)? Yes Addressing deficits in the following areas: balance, endurance, locomotion, strength, transferring, bowel/bladder control, bathing, dressing, feeding, grooming, toileting, cognition, speech, language, swallowing and psychosocial support 5. Can the patient actively participate in an intensive therapy program of at least 3 hrs of therapy per day at least 5 days per week? Potentially 6. The potential for patient to make measurable gains while on inpatient rehab is good 7. Anticipated functional outcomes upon discharge from inpatient rehab are mod assist  with PT, mod assist with OT, min assist and mod assist with SLP. 8. Estimated rehab length of stay to reach the above functional goals is: 20-30 days 9. Anticipated D/C setting: Home 10. Anticipated post D/C treatments: HH therapy and Outpatient therapy 11. Overall Rehab/Functional Prognosis: good  RECOMMENDATIONS: This patient's condition is appropriate for continued rehabilitative care in the following setting: eventually CIR once activity tolerance increases. Patient has agreed to participate in recommended program. N/A Note that insurance prior authorization may be required for reimbursement for recommended care.  Comment: Rehab Admissions Coordinator to follow up.  Thanks,  Carly Staggers, MD, Tilford Pillar, PA-C 03/31/2017    Revision History  Routing History

## 2017-04-09 NOTE — Progress Notes (Signed)
[name], RN Rehab Admission Coordinator Signed Physical Medicine and Rehabilitation  PMR Pre-admission Date of Service: 04/09/2017 10:10 AM  Related encounter: ED to Hosp-Admission (Current) from 03/03/2017 in [facility]       _0 Hide copied text PMR Admission Coordinator Pre-Admission Assessment  Patient: Carly [name] is an 46 y.o., female MRN: [id number] DOB: 07-Apr-1971 Height: _1  (170.2 cm) Weight: 98.2 kg (216 lb 7.9 oz)                                                                                                                                                  Insurance Information HMO:     PPO:  Yes     PCP:       IPA:       80/20:       OTHER:  Group # [id number] PRIMARY:  [employer]      Policy#: [id number]      Subscriber:  Shiela Mayer CM Name: Vevelyn Royals      Phone#: [phone]     Fax#: [phone] Pre-Cert#: [id number]      Employer:  [employer] Benefits:  Phone #: [phone]     Name:  Online Eff. Date: 09/16/16     Deduct:  $900 (met $900)      Out of Pocket Max:  $6000 (met [phone])      Life Max: N/A CIR: $150 days 1-5      SNF: $150 days 1-5 Outpatient: 60 visits combined     Co-Pay: $40/visit Home Health: 80% with 120 visits     Co-Pay: 20% DME: 80%     Co-Pay: 20% Providers: in network  Medicaid Application Date:        Case Manager:   Disability Application Date:        Case Worker:    Emergency Tax adviser Information    Name Relation Home Work [place] Spouse [phone]  [phone]     Current Medical History  Patient Admitting Diagnosis:Left ICA aneurysm with resultant left frontal hemorrhage s/p coiling   History of Present Illness: A 46 y.o.femalewith history of HTN, GERD, migraines who was admitted on 03/03/17 with HA and focal seizure activity and developed progressive decrease in LOC requiring intubation in ED. CTA head/neck revealed ruptured intracranial aneurysm with  hemorrhage into the left inferior frontal gyrus and left lateral ventricle with generalized vasospasm--severe in B-ACAs. Intraventricular catheter placed by Dr. Cyndy Freeze and she underwent cerebral angio with coiling of L-ICA aneurysm by Dr. Kathyrn Sheriff. Hospital course significant for blood drainage from IVC, multiple episodes of self extubation, HCAP, hypotension, lack of responsiveness as well as signs of minimal neurologic improvement. She was made DNR and family elected extubation with monitoring. EVD clamped with follow up CCT showing marked  increase in hydrocephalus but no change in MS therefore discontinued 7/14. Mentation improving and PEG placed for nutritional support by Dr. Hulen Skains on 7/17. Patient with generalized weakness, aphonia, needs tactile and showing increased ability to follow commands. CIR recommended by rehab team.    Total: 15=NIH  Past Medical History      Past Medical History:  Diagnosis Date  . GERD (gastroesophageal reflux disease)   . Hypertension   . Migraines     Family History  family history includes Cancer in her father; Diabetes in her maternal grandmother and mother; Hyperlipidemia in her mother and paternal grandfather; Hypertension in her mother.  Prior Rehab/Hospitalizations: No previous rehab.  Has the patient had major surgery during 100 days prior to admission? No  Current Medications   Current Facility-Administered Medications:  .   stroke: mapping our early stages of recovery book, , Does not apply, Once, Vinie Sill C, NP .  1.01 % sodium chloride infusion, , Intravenous, PRN, Chesley Mires, MD, Last Rate: 10 mL/hr at 03/31/17 1600 .  acetaminophen (TYLENOL) solution 650 mg, 650 mg, Per Tube, B5Z PRN, Pershing Proud, NP, 025 mg at 04/06/17 1823 .  [DISCONTINUED] acetaminophen (TYLENOL) tablet 650 mg, 650 mg, Oral, Q4H PRN **OR** [DISCONTINUED] acetaminophen (TYLENOL) solution 650 mg, 650 mg, Per Tube, Q4H PRN, 650 mg at 03/25/17 0225  **OR** acetaminophen (TYLENOL) suppository 650 mg, 650 mg, Rectal, Q4H PRN, Costella, Vista Mink, PA-C, 650 mg at 03/20/17 2303 .  chlorhexidine (PERIDEX) 0.12 % solution 15 mL, 15 mL, Mouth Rinse, BID, Consuella Lose, MD, 15 mL at 04/09/17 0950 .  feeding supplement (OSMOLITE 1.2 CAL) liquid 1,000 mL, 1,000 mL, Per Tube, Continuous, Rayburn, Floyce Stakes, PA-C, Last Rate: 60 mL/hr at 04/06/17 1838, 1,000 mL at 04/06/17 1838 .  feeding supplement (PRO-STAT SUGAR FREE 64) liquid 30 mL, 30 mL, Per Tube, Daily, Consuella Lose, MD, 30 mL at 04/09/17 0950 .  HYDROcodone-acetaminophen (HYCET) 7.5-325 mg/15 ml solution 10 mL, 10 mL, Per Tube, Q6H PRN, Izora Gala A, PA-C, 10 mL at 04/06/17 2027 .  labetalol (NORMODYNE,TRANDATE) injection 10 mg, 10 mg, Intravenous, E5I PRN, Pershing Proud, NP .  levETIRAcetam (KEPPRA) 100 MG/ML solution 1,000 mg, 1,000 mg, Per Tube, BID, Consuella Lose, MD, 1,000 mg at 04/09/17 0951 .  loperamide (IMODIUM) 1 MG/5ML solution 2 mg, 2 mg, Per Tube, PRN, Pershing Proud, NP, 2 mg at 04/02/17 0950 .  LORazepam (ATIVAN) injection 1 mg, 1 mg, Intravenous, D7O PRN, Vinie Sill C, NP .  MEDLINE mouth rinse, 15 mL, Mouth Rinse, q12n4p, Consuella Lose, MD, 15 mL at 04/09/17 1132 .  metoCLOPramide (REGLAN) 10 MG/10ML solution 5 mg, 5 mg, Per Tube, Daily, Meyran, Ocie Cornfield, NP, 5 mg at 04/09/17 1000 .  morphine 2 MG/ML injection 2 mg, 2 mg, Intravenous, Q2H PRN, Earnie Larsson, MD, 2 mg at 04/06/17 0411 .  multivitamin liquid 15 mL, 15 mL, Per Tube, Daily, Vinie Sill C, NP, 15 mL at 04/09/17 0950 .  nystatin (MYCOSTATIN) 100000 UNIT/ML suspension 500,000 Units, 5 mL, Oral, QID, Meyran, Ocie Cornfield, NP, 500,000 Units at 04/09/17 0950 .  pantoprazole sodium (PROTONIX) 40 mg/20 mL oral suspension 40 mg, 40 mg, Per Tube, Daily, Pershing Proud, NP, 40 mg at 04/09/17 0950  Patients Current Diet: Diet regular Room service appropriate? Yes; Fluid consistency:  Thin  Precautions / Restrictions Precautions Precautions: Fall Precaution Comments: PEG , abdominal binder Restrictions Weight Bearing Restrictions: No   Has the patient had 2  or more falls or a fall with injury in the past year?No  Prior Activity Level Community (5-7x/wk): Worked FT in [employer], was driving.  Home Assistive Devices / Equipment Home Assistive Devices/Equipment: None  Prior Device Use: Indicate devices/aids used by the patient prior to current illness, exacerbation or injury? None  Prior Functional Level Prior Function Level of Independence: Independent Comments: per pt report she was indpendent  Self Care: Did the patient need help bathing, dressing, using the toilet or eating?  Independent  Indoor Mobility: Did the patient need assistance with walking from room to room (with or without device)? Independent  Stairs: Did the patient need assistance with internal or external stairs (with or without device)? Independent  Functional Cognition: Did the patient need help planning regular tasks such as shopping or remembering to take medications? Independent  Current Functional Level Cognition  Arousal/Alertness: Awake/alert Overall Cognitive Status: Impaired/Different from baseline Current Attention Level: Sustained Orientation Level: Oriented to person, Oriented to place, Disoriented to time, Disoriented to situation Following Commands: Follows one step commands consistently Safety/Judgement: Decreased awareness of deficits General Comments: pt wanting bright light off, but did not initiate calling for help Attention: Sustained Sustained Attention: Impaired Sustained Attention Impairment: Verbal basic, Functional basic Comments: slow processing    Extremity Assessment (includes Sensation/Coordination)  Upper Extremity Assessment: RUE deficits/detail, LUE deficits/detail RUE Deficits / Details: 3/5 shoulder, elbow, 4-/5 gross  graspt RUE Sensation: decreased light touch RUE Coordination: decreased fine motor, decreased gross motor LUE Deficits / Details: 3-/5 shoulder, elbow to hand 3/5 LUE Sensation: decreased light touch LUE Coordination: decreased fine motor, decreased gross motor  Lower Extremity Assessment: Defer to PT evaluation RLE Deficits / Details: attempted to wiggle toes, otherwise 0/5 RLE Sensation: decreased light touch LLE Deficits / Details: grossly 0/5 LLE Sensation: decreased light touch    ADLs  Overall ADL's : Needs assistance/impaired General ADL Comments: Pt with improved functional use of UEs.    Mobility  Overal bed mobility: Needs Assistance Bed Mobility: Supine to Sit, Sit to Supine Rolling: Min assist Sidelying to sit: Mod assist, +2 for safety/equipment Supine to sit: Mod assist Sit to supine: Mod assist General bed mobility comments: cues for technique, assist for L LE over EOB and to raise trunk, assist for LEs back into bed    Transfers  Overall transfer level: Needs assistance Transfers: Sit to/from Stand, Stand Pivot Transfers Sit to Stand: Mod assist, +2 physical assistance, From elevated surface Stand pivot transfers: Mod assist, +2 physical assistance, From elevated surface Squat pivot transfers: Max assist, Mod assist, +2 physical assistance  Lateral/Scoot Transfers: Max assist, +2 physical assistance, From elevated surface General transfer comment: Pt able to stand with assist to rise and for anterior translation. Pt with good static standing balance with arm on P.T. arm. Pt able to step with bil UE support from bed to chair without buckling. Cues to step fully to chair prior to sitting. min assist for reciprocal scooting to EOB and back in chair with pad    Ambulation / Gait / Stairs / Wheelchair Mobility  Ambulation/Gait General Gait Details: unable    Posture / Balance Dynamic Sitting Balance Sitting balance - Comments: requires B UE support, sat 8  minutes Balance Overall balance assessment: Needs assistance Sitting-balance support: Feet supported, Bilateral upper extremity supported Sitting balance-Leahy Scale: Poor Sitting balance - Comments: requires B UE support, sat 8 minutes Postural control: Posterior lean, Left lateral lean Standing balance-Leahy Scale: Poor Standing balance comment: midline posture  with bil UE support    Special needs/care consideration BiPAP/CPAP No CPM No Continuous Drip IV KVO Dialysis No       Life Vest No Oxygen Room air Special Bed No Trach Size No Wound Vac (area) No     Skin  No                             Bowel mgmt: Last BM 04/08/17, incontinence Bladder mgmt: External catheter, incontinence Diabetic mgmt No    Previous Home Environment Living Arrangements: Spouse/significant other Home Care Services: No Additional Comments: no family present, pt unable to communicate PLOF  Discharge Living Setting Plans for Discharge Living Setting: Patient's home, House, Lives with (comment) (Lives with husband and daughter.) Type of Home at Discharge: House Discharge Home Layout: One level Discharge Home Access: Stairs to enter Entrance Stairs-Number of Steps: 3 steps Does the patient have any problems obtaining your medications?: No  Social/Family/Support Systems Patient Roles: Spouse, Parent (Has a husband and a 73 yo daughter.) Contact Information: Malaina Mortellaro - husband - [phone] Anticipated Caregiver: Husband and may have to hire assistance. Ability/Limitations of Caregiver: Husband works as a Administrator. Caregiver Availability: Other (Comment) (Husband is aware of the need for assistance after rehab.) Discharge Plan Discussed with Primary Caregiver: Yes Is Caregiver In Agreement with Plan?: Yes Does Caregiver/Family have Issues with Lodging/Transportation while Pt is in Rehab?: No  Goals/Additional Needs Patient/Family Goal for Rehab: PT/OT min assist, SLP supervision to  min assist goals Expected length of stay: 16-20 days Cultural Considerations: None Dietary Needs: Regular diet, thin liquids Equipment Needs: TBD Pt/Family Agrees to Admission and willing to participate: Yes Program Orientation Provided & Reviewed with Pt/Caregiver Including Roles  & Responsibilities: Yes  Decrease burden of Care through IP rehab admission: N/A  Possible need for SNF placement upon discharge: Yes, likely will need SNF after inpatient rehab admission as husband works.  Patient Condition: This patient's medical and functional status has changed since the consult dated: 03/31/17 in which the Rehabilitation Physician determined and documented that the patient's condition is appropriate for intensive rehabilitative care in an inpatient rehabilitation facility. See "History of Present Illness" (above) for medical update. Functional changes are: Currently requiring mod assist for transfers. Patient's medical and functional status update has been discussed with the Rehabilitation physician and patient remains appropriate for inpatient rehabilitation. Will admit to inpatient rehab today.  Preadmission Screen Completed By:  [name], 04/09/2017 12:26 PM ______________________________________________________________________   Discussed status with Dr. Posey Pronto on 04/09/17 at 45 and received telephone approval for admission today.  Admission Coordinator:  [name], time 1226/Date 04/09/17       Cosigned by: Jamse Arn, MD at 04/09/2017 12:29 PM  Revision History

## 2017-04-09 NOTE — Progress Notes (Addendum)
Pt seen and examined.  No issues overnight. Passed swallow test. Resumed normal diet yesterday. Tolerated well.  No complaints today  EXAM: Temp:  [98.8 F (37.1 C)-99.4 F (37.4 C)] 99.4 F (37.4 C) (07/25 0700) Pulse Rate:  [87-99] 90 (07/25 0435) Resp:  [19-23] 22 (07/25 0435) BP: (105-127)/(53-78) 107/58 (07/25 0435) SpO2:  [95 %-99 %] 95 % (07/25 0435) Weight:  [98.2 kg (216 lb 7.9 oz)] 98.2 kg (216 lb 7.9 oz) (07/25 0435) Intake/Output      07/24 0701 - 07/25 0700 07/25 0701 - 07/26 0700   P.O. 300    NG/GT 1440    Total Intake(mL/kg) 1740 (17.7)    Urine (mL/kg/hr) 1800 (0.8)    Total Output 1800     Net -60          Urine Occurrence 1 x    Stool Occurrence 1 x     Awake and alert Follows commands throughout CN grossly intact Moves all extremities  Plan Continues to make good progress Continue to advance diet and decrease tube feedings Dispo planning

## 2017-04-09 NOTE — Clinical Social Work Note (Addendum)
[facility] unable to extend a bed offer to patient. CSW called and notified patient's husband. He is currently [facility] who also previously declined bed offer. CSW faxed updated information and left message for admissions coordinator. CSW made husband aware of new bed offers from [facility] in [place].  [name], [place] [phone]  12:01 pm CSW spoke with [facility] admissions coordinator. Patient has a bed at CIR today. She has notified the patient's husband. CSW notified RNCM and left voicemail for PA.  [name], [place] [phone]  2:48 pm Patient has orders to discharge to CIR today.   CSW signing off.   [name], [place]

## 2017-04-09 NOTE — Progress Notes (Signed)
Rehab admissions - I received a call from attending MD yesterday asking for inpatient rehab admission.  I spoke with [facility] insurance carrier today and I do have approval for acute inpatient rehab admission for today.  I spoke with patient and her husband and they are in agreement to inpatient rehab.  Bed available today and will admit to acute inpatient rehab today.  Call me for questions.  #[phone]

## 2017-04-09 NOTE — IPOC Note (Signed)
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[place])   Prediabetes   Labile blood pressure   Transaminitis     Functional Problem List: Nursing Bladder, Bowel, Endurance, Medication Management, Perception, Safety, Sensory  PT Balance, Behavior, Endurance, Motor, Pain, Perception, Safety, Sensory  OT Balance, Motor, Cognition, Endurance, Vision  SLP Cognition, Endurance  TR         Basic ADL's: OT Grooming, Bathing, Dressing, Toileting, Eating     Advanced  ADL's: OT       Transfers: PT Bed Mobility, Bed to Chair, Car, [employer], Futures trader, Metallurgist: PT Ambulation, Emergency planning/management officer, Stairs     Additional Impairments: OT Fuctional Use of Upper Extremity  SLP Communication, Social Cognition expression Social Interaction, Problem Solving, Memory, Attention, Awareness  TR      Anticipated Outcomes Item Anticipated Outcome  Self Feeding modified independent  Swallowing      Basic self-care  supervision  Toileting  supervision   Bathroom Transfers supervision  Bowel/Bladder  Patient will be continent of bowel and bladder by discharge.  Transfers  supervision for all transfers  Locomotion  supervision for gait and stairs, and w/c  Communication  Supervision  Cognition  Supervision  Pain  Patient's pain level will remain less than 5 during hospital stay.  Safety/Judgment  Patient will remain free from fall and injury during hospital stay   Therapy Plan: PT Intensity: Minimum of 1-2 x/day ,45 to 90 minutes PT Frequency: 5 out of 7 days PT Duration Estimated Length of Stay: 14-18 days OT Intensity: Minimum of 1-2 x/day, 45 to 90 minutes OT Frequency: 5 out of 7 days OT Duration/Estimated Length of Stay: 14-16 days SLP Intensity: Minumum of 1-2 x/day, 30 to 90 minutes SLP  Frequency: 3 to 5 out of 7 days SLP Duration/Estimated Length of Stay: 3 weeks       Team Interventions: Nursing Interventions Patient/Family Education, Bladder Management, Bowel Management, Disease Management/Prevention, Pain Management, Medication Management, Psychosocial Support, Discharge Planning  PT interventions Ambulation/gait training, Balance/vestibular training, Cognitive remediation/compensation, DME/adaptive equipment instruction, Disease management/prevention, Discharge planning, Functional mobility training, Functional electrical stimulation, Neuromuscular re-education, Stair training, Patient/family education, Pain management, Psychosocial support, Splinting/orthotics, Therapeutic Activities, UE/LE Strength taining/ROM, Visual/perceptual remediation/compensation, Wheelchair propulsion/positioning, UE/LE Coordination activities, Therapeutic Exercise  OT Interventions Balance/vestibular training, Cognitive remediation/compensation, Academic librarian, Engineer, drilling, Disease mangement/prevention, Discharge planning, Functional mobility training, Neuromuscular re-education, Patient/family education, Self Care/advanced ADL retraining, Therapeutic Activities, UE/LE Coordination activities, Therapeutic Exercise, UE/LE Strength taining/ROM  SLP Interventions Cognitive remediation/compensation, Cueing hierarchy, Environmental controls, Functional tasks, Internal/external aids, Medication managment, Speech/Language facilitation, Patient/family education  TR Interventions    SW/CM Interventions Discharge Planning, Psychosocial Support, Patient/Family Education    Team Discharge Planning: Destination: PT-Home ,OT- Home , SLP-Home Projected Follow-up: PT-Home health PT, OT-  Home health OT, SLP-Home Health SLP, Outpatient SLP, 24 hour supervision/assistance Projected Equipment Needs: PT-To be determined, OT- 3 in 1 bedside comode, Tub/shower bench, SLP-None recommended by  SLP Equipment Details: PT- , OT-  Patient/family involved in discharge planning: PT- Patient,  OT-Patient, Family member/caregiver, SLP-Patient  MD ELOS: 16-19 days. Medical Rehab Prognosis:  Good Assessment: Carly [name] is a 46 y.o. right female with history of HTN, GERD, migraines who was admitted on 03/03/17 with HA and focal seizure activity and developed progressive decrease in LOC requiring intubation  in ED. 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 Intraventricular catheter placed by Dr. Cyndy Freeze and she underwent cerebral angio with coiling of L-ICA aneurysm by Dr. Kathyrn Sheriff.  Hospital course significant for blood drainage from IVC, multiple episodes of self extubation, HCAP, hypotension, lack of responsiveness as well as signs of minimal neurologic improvement.   She was made DNR and family elected extubation with monitoring. EVD clamped with follow up Cranial CT, reviewed, showing increase in hydrocephalus.  Per report, no change in MS therefore discontinued 7/14. Mentation improving and PEG placed for nutritional support by Dr. Hulen Skains on 7/17. Hospital course complicated by tachypnea, tachycardia, ABLA.  As lethargy resolved, she was started on regular diet. PO intake poor and requiring assistance with meals. She continues to have diffuse weakness with delayed processing, significant visual deficits and significant deficits in mobility. Will set goals for Supervision with PT/OT/SLP.   See Team Conference Notes for weekly updates to the plan of care

## 2017-04-09 NOTE — Progress Notes (Signed)
  Speech Language Pathology Treatment: Dysphagia;Cognitive-Linquistic  Patient Details Name: Carly [name] MRN: [id number] DOB: 1971/08/25 Today's Date: 04/09/2017 Time: 7782-4235 SLP Time Calculation (min) (ACUTE ONLY): 26 min  Assessment / Plan / Recommendation Clinical Impression  SLP followed up for cognitive linguistic treatment and diet tolerance. Pt with significant improvements in expressive output, communicating this date at the sentence level with cues from SLP. Speed of processing remains decreased as well as low endurance. SLP reinforced recall of functional information and orientation tasks. Pt appropriately answered biological background questions with 80 percent accuracy. Decreased recall as well as global cognitive deficits exhibited however continued progress noted. Pts cognitive deficits are impacted by visual deficits. Continued ST intervention indicated for cognition to maximize safety and independence.   SLP observed pt with thin liquids without overt s/sx of aspiration. RN reports good PO intake at breakfast meal. Pt politely declined solid PO snack. Continue regular thin liquid diet with full supervision with all PO to aid with self feeding and maximize nutrition and hydration as cognitive deficits persist.    HPI HPI: 46 y.o. female  with past medical history of Hypertension, GERD, migraine headaches admitted on 03/03/2017 with headache and focal seizure activity. CT scan of the head showed subarachnoid hemorrhage with associated IVH in the setting of presumed ruptured aneurysm. Patient developed progressive decrease in level of consciousness, vomited, postured. She was intubated for airway protection and IVC placed per neurosurgery. 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 Positive for active extravasation of hemorrhage. Patient underwent  embolization on 03/07/2017.       SLP Plan  Continue with current plan of care (plan for CIR this afternoon)       Recommendations  Diet recommendations: Regular;Thin liquid Medication Administration: Whole meds with liquid Supervision: Staff to assist with self feeding;Full supervision/cueing for compensatory strategies Compensations: Minimize environmental distractions;Slow rate;Small sips/bites Postural Changes and/or Swallow Maneuvers: Seated upright 90 degrees;Upright 30-60 min after meal                Oral Care Recommendations: Oral care BID Follow up Recommendations: Inpatient Rehab SLP Visit Diagnosis: Cognitive communication deficit (T61.443) Plan: Continue with current plan of care (plan for CIR this afternoon)       GO               Arvil Chaco MA, [place] 04/09/2017, 1:49 PM

## 2017-04-09 NOTE — Progress Notes (Signed)
Patient arrived on unit approximately 1500. She is alert to self and situation. She denies pain at this time.Oriented to room/unit. No acute distress noted.

## 2017-04-10 ENCOUNTER — Inpatient Hospital Stay (HOSPITAL_COMMUNITY): Payer: Commercial Indemnity

## 2017-04-10 ENCOUNTER — Inpatient Hospital Stay (HOSPITAL_COMMUNITY): Payer: 59 | Admitting: Speech Pathology

## 2017-04-10 ENCOUNTER — Inpatient Hospital Stay (HOSPITAL_COMMUNITY): Payer: 59 | Admitting: Occupational Therapy

## 2017-04-10 ENCOUNTER — Inpatient Hospital Stay (HOSPITAL_COMMUNITY): Payer: 59

## 2017-04-10 DIAGNOSIS — R740 Nonspecific elevation of levels of transaminase and lactic acid dehydrogenase [LDH]: Secondary | ICD-10-CM

## 2017-04-10 DIAGNOSIS — R7401 Elevation of levels of liver transaminase levels: Secondary | ICD-10-CM

## 2017-04-10 DIAGNOSIS — R609 Edema, unspecified: Secondary | ICD-10-CM

## 2017-04-10 DIAGNOSIS — D62 Acute posthemorrhagic anemia: Secondary | ICD-10-CM

## 2017-04-10 DIAGNOSIS — R0989 Other specified symptoms and signs involving the circulatory and respiratory systems: Secondary | ICD-10-CM

## 2017-04-10 LAB — CBC WITH DIFFERENTIAL/PLATELET
Basophils Absolute: 0 10*3/uL (ref 0.0–0.1)
Basophils Relative: 0 %
EOS PCT: 3 %
Eosinophils Absolute: 0.2 10*3/uL (ref 0.0–0.7)
HEMATOCRIT: 30.4 % — AB (ref 36.0–46.0)
Hemoglobin: 9.5 g/dL — ABNORMAL LOW (ref 12.0–15.0)
LYMPHS ABS: 2.3 10*3/uL (ref 0.7–4.0)
LYMPHS PCT: 31 %
MCH: 26 pg (ref 26.0–34.0)
MCHC: 31.3 g/dL (ref 30.0–36.0)
MCV: 83.1 fL (ref 78.0–100.0)
MONO ABS: 0.4 10*3/uL (ref 0.1–1.0)
Monocytes Relative: 5 %
NEUTROS ABS: 4.6 10*3/uL (ref 1.7–7.7)
Neutrophils Relative %: 61 %
PLATELETS: 255 10*3/uL (ref 150–400)
RBC: 3.66 MIL/uL — AB (ref 3.87–5.11)
RDW: 14.7 % (ref 11.5–15.5)
WBC: 7.5 10*3/uL (ref 4.0–10.5)

## 2017-04-10 LAB — GLUCOSE, CAPILLARY
GLUCOSE-CAPILLARY: 98 mg/dL (ref 65–99)
GLUCOSE-CAPILLARY: 102 mg/dL — AB (ref 65–99)
Glucose-Capillary: 110 mg/dL — ABNORMAL HIGH (ref 65–99)
Glucose-Capillary: 100 mg/dL — ABNORMAL HIGH (ref 65–99)

## 2017-04-10 LAB — COMPREHENSIVE METABOLIC PANEL
ALT: 67 U/L — AB (ref 14–54)
AST: 48 U/L — AB (ref 15–41)
Albumin: 3.1 g/dL — ABNORMAL LOW (ref 3.5–5.0)
Alkaline Phosphatase: 173 U/L — ABNORMAL HIGH (ref 38–126)
Anion gap: 8 (ref 5–15)
BUN: 9 mg/dL (ref 6–20)
CHLORIDE: 101 mmol/L (ref 101–111)
CO2: 30 mmol/L (ref 22–32)
CREATININE: 0.74 mg/dL (ref 0.44–1.00)
Calcium: 9.2 mg/dL (ref 8.9–10.3)
GFR calc non Af Amer: >60 mL/min (ref >60)
Glucose, Bld: 103 mg/dL — ABNORMAL HIGH (ref 65–99)
POTASSIUM: 3.8 mmol/L (ref 3.5–5.1)
SODIUM: 139 mmol/L (ref 135–145)
Total Bilirubin: 0.7 mg/dL (ref 0.3–1.2)
Total Protein: 6.5 g/dL (ref 6.5–8.1)

## 2017-04-10 MED ORDER — ADULT MULTIVITAMIN W/MINERALS CH
1.0000 | ORAL_TABLET | Freq: Every day | ORAL | Status: DC
  Administered 2017-04-10 – 2017-04-23 (×14): 1 via ORAL
  Filled 2017-04-10 (×14): qty 1

## 2017-04-10 MED ORDER — PANTOPRAZOLE SODIUM 40 MG PO TBEC
40.0000 mg | DELAYED_RELEASE_TABLET | Freq: Every day | ORAL | Status: DC
  Administered 2017-04-10 – 2017-04-23 (×14): 40 mg via ORAL
  Filled 2017-04-10 (×14): qty 1

## 2017-04-10 MED ORDER — METOCLOPRAMIDE HCL 10 MG PO TABS
5.0000 mg | ORAL_TABLET | Freq: Every day | ORAL | Status: DC
  Administered 2017-04-10 – 2017-04-23 (×14): 5 mg via ORAL
  Filled 2017-04-10 (×14): qty 1

## 2017-04-10 MED ORDER — LEVETIRACETAM 500 MG PO TABS
1000.0000 mg | ORAL_TABLET | Freq: Two times a day (BID) | ORAL | Status: DC
  Administered 2017-04-10 – 2017-04-23 (×27): 1000 mg via ORAL
  Filled 2017-04-10 (×27): qty 2

## 2017-04-10 NOTE — Progress Notes (Signed)
*  PRELIMINARY RESULTS* Vascular Ultrasound Bilateral lower extremity venous duplex has been completed.  Preliminary findings: No evidence of deep vein thrombosis or baker's cysts bilaterally.   [name] 04/10/2017, 3:07 PM

## 2017-04-10 NOTE — Progress Notes (Signed)
[facility] PHYSICAL MEDICINE & REHABILITATION     PROGRESS NOTE  Subjective/Complaints:  Pt seen laying in bed this Am working with SLP.  She states she could not get comfortable overnight.    ROS: Denies CP, SOB, N/V/D.  Objective: Vital Signs: Blood pressure (!) 143/83, pulse 86, temperature 98.9 F (37.2 C), temperature source Oral, resp. rate 19, weight 97.1 kg (214 lb 2.3 oz), SpO2 100 %. Dg Swallowing Func-speech Pathology  Result Date: 04/08/2017 Objective Swallowing Evaluation: Type of Study: MBS-Modified Barium Swallow Study Patient Details Name: Carly [name] MRN: [id number] Date of Birth: [birth year] Today's Date: 04/08/2017 Time: SLP Start Time (ACUTE ONLY): 1430-SLP Stop Time (ACUTE ONLY): 1500 SLP Time Calculation (min) (ACUTE ONLY): 30 min Past Medical History: Past Medical History: Diagnosis Date . GERD (gastroesophageal reflux disease)  . Hypertension  . Migraines  Past Surgical History: Past Surgical History: Procedure Laterality Date . CESAREAN SECTION   . ESOPHAGOGASTRODUODENOSCOPY N/A 04/01/2017  Procedure: ESOPHAGOGASTRODUODENOSCOPY (EGD);  Surgeon: Georganna Skeans, MD;  Location: [facility];  Service: General;  Laterality: N/A; . IR ANGIO INTRA EXTRACRAN SEL INTERNAL CAROTID UNI R MOD SED  03/03/2017 . IR ANGIO VERTEBRAL SEL VERTEBRAL BILAT MOD SED  03/03/2017 . IR ANGIOGRAM FOLLOW UP STUDY  03/03/2017 . IR ANGIOGRAM FOLLOW UP STUDY  03/03/2017 . IR ANGIOGRAM FOLLOW UP STUDY  03/03/2017 . IR ANGIOGRAM FOLLOW UP STUDY  03/03/2017 . IR TRANSCATH/EMBOLIZ  03/03/2017 . PEG PLACEMENT N/A 04/01/2017  Procedure: PERCUTANEOUS ENDOSCOPIC GASTROSTOMY (PEG) PLACEMENT;  Surgeon: Georganna Skeans, MD;  Location: [employer] [facility];  Service: General;  Laterality: N/A; . RADIOLOGY WITH ANESTHESIA N/A 03/03/2017  Procedure: RADIOLOGY WITH ANESTHESIA;  Surgeon: Consuella Lose, MD;  Location: [facility];  Service: Radiology;  Laterality: N/A; HPI: 46 y.o. female  with past medical history of Hypertension, GERD,  migraine headaches admitted on 03/03/2017 with headache and focal seizure activity. CT scan of the head showed subarachnoid hemorrhage with associated IVH in the setting of presumed ruptured aneurysm. Patient developed progressive decrease in level of consciousness, vomited, postured. She was intubated for airway protection and IVC placed per neurosurgery. 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 Positive for active extravasation of hemorrhage. Patient underwent embolization on 03/07/2017.  Subjective: alert, communicative Assessment / Plan / Recommendation [facility] IP CLINICAL IMPRESSIONS 04/08/2017 Clinical Impression Pt presents with a normal oropharyngeal swallow. There is adequate mastication; strong propulsion of materials through pharynx; no residue post-swallow; no penetration nor aspiration.  Pt is safe to resume a regular consistency diet, thin liquids, meds whole with liquids.  Results/recs reviewed with Mr. and Mrs. Nesheim, both of whom were very pleased with results.  Rec f/u re: ceasing enteral feedings when appropriate.  SLP Visit Diagnosis Dysphagia, unspecified (R13.10) Attention and concentration deficit following -- Frontal lobe and executive function deficit following -- Impact on safety and function No limitations   [facility] IP TREATMENT RECOMMENDATION 03/27/2017 Treatment Recommendations Therapy as outlined in treatment plan below   Prognosis 03/27/2017 Prognosis for Safe Diet Advancement Good Barriers to Reach Goals Cognitive deficits;Language deficits Barriers/Prognosis Comment -- [facility] IP DIET RECOMMENDATION 04/08/2017 SLP Diet Recommendations Regular solids;Thin liquid Liquid Administration via Cup;Straw Medication Administration Whole meds with liquid Compensations Minimize environmental distractions Postural Changes --   [facility] IP OTHER RECOMMENDATIONS 04/08/2017 Recommended  Consults -- Oral Care Recommendations Oral care BID Other Recommendations --   [facility] IP FOLLOW UP RECOMMENDATIONS 04/03/2017 Follow  up Recommendations Inpatient Rehab;Skilled Nursing facility   [facility] IP FREQUENCY AND DURATION 03/27/2017 Speech Therapy Frequency (ACUTE ONLY) min 2x/week Treatment Duration --      [facility] IP ORAL PHASE 04/08/2017 Oral Phase WFL Oral - Pudding Teaspoon -- Oral - Pudding Cup -- Oral - Honey Teaspoon -- Oral - Honey Cup -- Oral - Nectar Teaspoon -- Oral - Nectar Cup -- Oral - Nectar Straw -- Oral - Thin Teaspoon -- Oral - Thin Cup -- Oral - Thin Straw -- Oral - Puree -- Oral - Mech Soft -- Oral - Regular -- Oral - Multi-Consistency -- Oral - Pill -- Oral Phase - Comment --  [facility] IP PHARYNGEAL PHASE 04/08/2017 Pharyngeal Phase WFL Pharyngeal- Pudding Teaspoon -- Pharyngeal -- Pharyngeal- Pudding Cup -- Pharyngeal -- Pharyngeal- Honey Teaspoon -- Pharyngeal -- Pharyngeal- Honey Cup -- Pharyngeal -- Pharyngeal- Nectar Teaspoon -- Pharyngeal -- Pharyngeal- Nectar Cup -- Pharyngeal -- Pharyngeal- Nectar Straw -- Pharyngeal -- Pharyngeal- Thin Teaspoon -- Pharyngeal -- Pharyngeal- Thin Cup -- Pharyngeal -- Pharyngeal- Thin Straw -- Pharyngeal -- Pharyngeal- Puree -- Pharyngeal -- Pharyngeal- Mechanical Soft -- Pharyngeal -- Pharyngeal- Regular -- Pharyngeal -- Pharyngeal- Multi-consistency -- Pharyngeal -- Pharyngeal- Pill -- Pharyngeal -- Pharyngeal Comment --  [facility] IP CERVICAL ESOPHAGEAL PHASE 04/08/2017 Cervical Esophageal Phase WFL Pudding Teaspoon -- Pudding Cup -- Honey Teaspoon -- Honey Cup -- Nectar Teaspoon -- Nectar Cup -- Nectar Straw -- Thin Teaspoon -- Thin Cup -- Thin Straw -- Puree -- Mechanical Soft -- Regular -- Multi-consistency -- Pill -- Cervical Esophageal Comment -- No flowsheet data found. [name] 04/08/2017, 3:21 PM               Recent Labs  04/10/17 0400  WBC 7.5  HGB 9.5*  HCT 30.4*  PLT 255    Recent Labs  04/10/17 0400  NA 139  K 3.8  CL 101   GLUCOSE 103*  BUN 9  CREATININE 0.74  CALCIUM 9.2   CBG (last 3)   Recent Labs  04/09/17 1656 04/09/17 2114 04/10/17 0647  GLUCAP 115* 114* 110*    Wt Readings from Last 3 Encounters:  04/10/17 97.1 kg (214 lb 2.3 oz)  04/09/17 98.2 kg (216 lb 7.9 oz)  02/25/17 109 kg (240 lb 3.2 oz)    Physical Exam:  BP (!) 143/83 (BP Location: Left Arm)   Pulse 86   Temp 98.9 F (37.2 C) (Oral)   Resp 19   Wt 97.1 kg (214 lb 2.3 oz)   LMP  (LMP Unknown) Comment: pt is unresponsive, and is unable to communicate  SpO2 100%   BMI 33.54 kg/m  Constitutional: She appears well-developed. Obese  HENT: Normocephalic and atraumatic.  Eyes: No discharge. EOMI. Cardiovascular: Normal rate and regular rhythm.  No JVD. Respiratory: Effort normal and breath sounds normal.  GI: Soft. Bowel sounds are normal. +PEG  Musculoskeletal: She exhibits no edema or tenderness.  Neurological: She is alert.  Right gaze preference  Rapid fluttering eye movements noted when head turned to left.  Soft voice  Oriented to self and place as hospital. Motor: B/l UE 4-/5 proximal to distal RLE: 3+/5 HF, KE, 4+/5 ADF/PF LLE: 4-/5 HF, KE, 4+/5 ADF/PF Skin: Skin is warm and dry. She is not diaphoretic.  Psychiatric: Her affect is blunt. Her speech is delayed. She is slowed. She does not express impulsivity. She is attentive.    Assessment/Plan: 1. Functional deficits secondary to left frontal hemorrhage s/p coiling which require 3+ hours  per day of interdisciplinary therapy in a comprehensive inpatient rehab setting. Physiatrist is providing close team supervision and 24 hour management of active medical problems listed below. Physiatrist and rehab team continue to assess barriers to discharge/monitor patient progress toward functional and medical goals.  Function:  Bathing Bathing position      Bathing parts      Bathing assist        Upper Body Dressing/Undressing Upper body dressing                     Upper body assist        Lower Body Dressing/Undressing Lower body dressing                                  Lower body assist        Toileting Toileting          Toileting assist     Transfers Chair/bed transfer             Locomotion Ambulation           Wheelchair          Cognition Comprehension    Expression    Social Interaction    Problem Solving    Memory      Medical Problem List and Plan: 1.  Generalized weakness, aphonia, needs tactile and showing increased ability to follow commands.   secondary to left frontal hemorrhage s/p coiling.  Begin CIR 2.  DVT Prophylaxis/Anticoagulation: Mechanical: Sequential compression devices, below knee Bilateral lower extremities  Will consider chemical prophylaxis 3. H/o migraines/Pain Management: tylenol prn for HA 4. Mood: lacks insight/awareness of deficits. LCSW to follow for evaluation and support when appropriate.  5. Neuropsych: This patient is not capable of making decisions on her own behalf. 6. Skin/Wound Care: routine pressure relief  measures 7. Fluids/Electrolytes/Nutrition: Started calorie count.  May need to start tube feeds.  8. Prediabetes: Hgb A1c- 5.9. Will continue to monitor BS ac/hs for now  Monitor with increased mobility 9. ABLA:   Hb 9.5 on 7/26 10. 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  12. Labile blood pressure  Cont to monitor with increased mobility 13. Transaminitis  Cont to monitor  LOS (Days) 1 A FACE TO FACE EVALUATION WAS PERFORMED  Casi Westerfeld Lorie Phenix 04/10/2017 8:16 AM

## 2017-04-10 NOTE — Evaluation (Signed)
Speech Language Pathology Assessment and Plan  Patient Details  Name: Carly [name] MRN: [id number] Date of Birth: 09-06-1971  SLP Diagnosis: Speech and Language deficits;Cognitive Impairments  Rehab Potential: Excellent ELOS: 3 weeks    Today's Date: 04/10/2017 SLP Individual Time: 5284-1324 SLP Individual Time Calculation (min): 60 min   Problem List:  Patient Active Problem List   Diagnosis Date Noted  . Prediabetes   . Labile blood pressure   . Transaminitis   . 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 Hydrocephalus   . Level of consciousness decreased   . SAH (subarachnoid hemorrhage) ([place])   . Tachycardia   . PEG (percutaneous endoscopic gastrostomy) status ([place])   . Acute blood loss anemia   . Tachypnea   . Ventilator dependent ([place])   . Palliative care by specialist   . Electrolyte imbalance 03/19/2017  . History of ETT   . Encounter for intubation   . Subarachnoid hemorrhage ([place])   . Acute respiratory failure ([place])   . 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 Routine general medical examination at a health care facility 08/23/2013  . Essential hypertension, benign 08/23/2013  . Impaired fasting glucose 08/23/2013  . Plantar fasciitis, bilateral 08/23/2013  . Unspecified vitamin D deficiency 08/23/2013  . Obesity, unspecified 08/23/2013  . Unspecified essential hypertension 12/31/2012  . GERD (gastroesophageal reflux disease) 12/31/2012  . COMMON MIGRAINE 06/16/2008  . CHEST PAIN, INTERMITTENT 06/16/2008   Past Medical History:  Past Medical History:  Diagnosis Date  . GERD (gastroesophageal reflux disease)   . Hypertension   . Migraines    Past Surgical History:  Past Surgical History:  Procedure Laterality Date  . CESAREAN SECTION    . ESOPHAGOGASTRODUODENOSCOPY N/A 04/01/2017   Procedure: ESOPHAGOGASTRODUODENOSCOPY (EGD);  Surgeon: Carly Skeans, MD;  Location: [facility];  Service: General;  Laterality: N/A;  . IR ANGIO INTRA  EXTRACRAN SEL INTERNAL CAROTID UNI R MOD SED  03/03/2017  . IR ANGIO VERTEBRAL SEL VERTEBRAL BILAT MOD SED  03/03/2017  . IR ANGIOGRAM FOLLOW UP STUDY  03/03/2017  . IR ANGIOGRAM FOLLOW UP STUDY  03/03/2017  . IR ANGIOGRAM FOLLOW UP STUDY  03/03/2017  . IR ANGIOGRAM FOLLOW UP STUDY  03/03/2017  . IR TRANSCATH/EMBOLIZ  03/03/2017  . PEG PLACEMENT N/A 04/01/2017   Procedure: PERCUTANEOUS ENDOSCOPIC GASTROSTOMY (PEG) PLACEMENT;  Surgeon: Carly Skeans, MD;  Location: [facility];  Service: General;  Laterality: N/A;  . RADIOLOGY WITH ANESTHESIA N/A 03/03/2017   Procedure: RADIOLOGY WITH ANESTHESIA;  Surgeon: Carly Lose, MD;  Location: [facility];  Service: Radiology;  Laterality: N/A;    Assessment / Plan / Recommendation Clinical Impression Carly [name] a 46 y.o.right femalewith history of HTN, GERD, migraines who was admitted on 03/03/17 with HA and focal seizure activity and developed progressive decrease in LOC requiring intubation in ED. History taken from chart review and family. 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 Intraventricular catheter placed by Dr. Cyndy [name] and she underwent cerebral angio with coiling of L-ICA aneurysm by Dr. Kathyrn [name]. Hospital course significant for blood drainage from IVC, multiple episodes of self extubation, HCAP, hypotension, lack of responsiveness as well as signs of minimal neurologic improvement. She was made DNR and family elected extubation with monitoring. EVD clamped with follow up Cranial CT, reviewed, showing increase in hydrocephalus. Per report, no change in MS therefore discontinued 7/14. Mentation improving and PEG placed for nutritional  support by Dr. Hulen [name] on 7/17. Hospital course complicated by tachypnea, tachycardia, ABLA. As lethargy resolved, she was started on regular diet yesterday. Po intake poor and  requiring assistance with meals. she continues to have diffuse weakness with delayed processing, significant visual deficits and significant deficits in mobility. CIR recommended by rehab team, pt admited on 04/09/17.   Comprehensive bedisde swallow evaulation and speech-language evaluations completed on 04/09/17. Pt presents with funcitonal oropharyngeal abilities c/b functional oral phase, timely and complete oral clearing and functional pharyngeal phase as evidenced by no overt s/s of aspiration with thin liquids via straw. No further follow up needs for dysphagia goals, however full nursing supervision is required during PO consumption d/t decreased task initiation, visual distrubance and attention. Pt's presents with moderately impaired cognitve deficts c/b decreased task initiaiton, sustained attention, recall of new information, orientation, visual disturbance (more to the left), basic problem sovling, awareness and speech intelligiblity deficits d/t decreased vocal intensity. Pt with speech intelligibility of ~ 50% at the phrase to sentence level d/t decreased intelligiblity. Skilled ST is required to adress the above mentioned deficits, increase functional independence and reduce caregiver burden. Anticipate that pt will need 24 hour supervision and follow up Outpatient or HHST.   Skilled Therapeutic Interventions          Skilled treatment session focused on completion of   SLP Assessment  Patient will need skilled [place] [facility] during CIR admission    Recommendations  SLP Diet Recommendations: Age appropriate regular solids;Thin Liquid Administration via: Cup;Straw Medication Administration: Whole meds with liquid Supervision: Staff to assist with self feeding;Full supervision/cueing for compensatory strategies (Full supervision for cognitive deficits - tasks initiation, attention) Compensations: Minimize environmental distractions;Slow rate;Small sips/bites Postural  Changes and/or Swallow Maneuvers: Seated upright 90 degrees;Upright 30-60 min after meal Oral Care Recommendations: Oral care BID Recommendations for Other Services: Neuropsych consult Patient destination: Home Follow up Recommendations: Home Health SLP;Outpatient SLP;24 hour supervision/assistance Equipment Recommended: None recommended by SLP    SLP Frequency 3 to 5 out of 7 days   SLP Duration  SLP Intensity  SLP Treatment/Interventions 3 weeks  Minumum of 1-2 x/day, 30 to 90 minutes  Cognitive remediation/compensation;Cueing hierarchy;Environmental controls;Functional tasks;Internal/external aids;Medication managment;Speech/Language facilitation;Patient/family education    Pain    Prior Functioning Cognitive/Linguistic Baseline: Information not available Type of Home: House  Lives With: Spouse Available Help at Discharge: Family Vocation: Full time employment  Function:  Eating Eating   Modified Consistency Diet: No Eating Assist Level: Swallowing techniques: self managed;Help with picking up utensils;Help managing cup/glass;Hand over hand assist;Helper scoops food on utensil (d/t visual deficits, task initiation)     Helper Scoops Food on Utensil: Occasionally     Cognition Comprehension Comprehension assist level: Understands basic 75 - 89% of the time/ requires cueing 10 - 24% of the time;Understands basic 50 - 74% of the time/ requires cueing 25 - 49% of the time  Expression   Expression assist level: Expresses basic 50 - 74% of the time/requires cueing 25 - 49% of the time. Needs to repeat parts of sentences.  Social Interaction Social Interaction assist level: Interacts appropriately 50 - 74% of the time - May be physically or verbally inappropriate.  Problem Solving Problem solving assist level: Solves basic 25 - 49% of the time - needs direction more than half the time to initiate, plan or complete simple activities  Memory Memory assist level: Recognizes or  recalls 25 - 49% of the time/requires cueing 50 - 75% of the  time   Short Term Goals: Week 1: SLP Short Term Goal 1 (Week 1): Pt will utilize external memory aids to recall new daily information with Min A cues.  SLP Short Term Goal 2 (Week 1): Pt will recall orientation information with Min A cues.  SLP Short Term Goal 3 (Week 1): Pt will initiate basic familiar tasks within 5 seconds in 75% of opportunites with Mod A cues.  SLP Short Term Goal 4 (Week 1): Pt will complete basic, familiar tasks within ADLs activities with Mod A cues.  SLP Short Term Goal 5 (Week 1): Pt will utilize increased vocal intensity with Mod A cues to achieve ~ 75% intelligilbity at the sentenc level. SLP Short Term Goal 6 (Week 1): Pt will locate objects in all fields of vision in 50% of opportunties with Mod A cues.   Refer to Care Plan for Long Term Goals  Recommendations for other services: Neuropsych  Discharge Criteria: Patient will be discharged from SLP if patient refuses treatment 3 consecutive times without medical reason, if treatment goals not met, if there is a change in medical status, if patient makes no progress towards goals or if patient is discharged from hospital.  The above assessment, treatment plan, treatment alternatives and goals were discussed and mutually agreed upon: by patient  Adalyne Lovick 04/10/2017, 9:27 AM

## 2017-04-10 NOTE — Progress Notes (Signed)
Patient information reviewed and entered into eRehab system by Osceola Depaz, RN, CRRN, PPS Coordinator.  Information including medical coding and functional independence measure will be reviewed and updated through discharge.    

## 2017-04-10 NOTE — Evaluation (Signed)
Physical Therapy Assessment and Plan  Patient Details  Name: Carly [name] MRN: [id number] Date of Birth: Aug 03, 1971  PT Diagnosis: Difficulty walking, Impaired vision, Impaired sensation and Muscle weakness Rehab Potential: Good ELOS: 14-18 days   Today's Date: 04/10/2017 PT Individual Time: 0900-1000, 1330-1400 PT Individual Time Calculation (min): 60 min , 30 min   Problem List:  Patient Active Problem List   Diagnosis Date Noted  . Prediabetes   . Labile blood pressure   . Transaminitis   . 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 Hydrocephalus   . Level of consciousness decreased   . SAH (subarachnoid hemorrhage) ([place])   . Tachycardia   . PEG (percutaneous endoscopic gastrostomy) status ([place])   . Acute blood loss anemia   . Tachypnea   . Ventilator dependent ([place])   . Palliative care by specialist   . Electrolyte imbalance 03/19/2017  . History of ETT   . Encounter for intubation   . Subarachnoid hemorrhage ([place])   . Acute respiratory failure ([place])   . 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 Routine general medical examination at a health care facility 08/23/2013  . Essential hypertension, benign 08/23/2013  . Impaired fasting glucose 08/23/2013  . Plantar fasciitis, bilateral 08/23/2013  . Unspecified vitamin D deficiency 08/23/2013  . Obesity, unspecified 08/23/2013  . Unspecified essential hypertension 12/31/2012  . GERD (gastroesophageal reflux disease) 12/31/2012  . COMMON MIGRAINE 06/16/2008  . CHEST PAIN, INTERMITTENT 06/16/2008    Past Medical History:  Past Medical History:  Diagnosis Date  . GERD (gastroesophageal reflux disease)   . Hypertension   . Migraines    Past Surgical History:  Past Surgical History:  Procedure Laterality Date  . CESAREAN SECTION    . ESOPHAGOGASTRODUODENOSCOPY N/A 04/01/2017   Procedure: ESOPHAGOGASTRODUODENOSCOPY (EGD);  Surgeon: Georganna Skeans, MD;  Location: [facility];  Service: General;   Laterality: N/A;  . IR ANGIO INTRA EXTRACRAN SEL INTERNAL CAROTID UNI R MOD SED  03/03/2017  . IR ANGIO VERTEBRAL SEL VERTEBRAL BILAT MOD SED  03/03/2017  . IR ANGIOGRAM FOLLOW UP STUDY  03/03/2017  . IR ANGIOGRAM FOLLOW UP STUDY  03/03/2017  . IR ANGIOGRAM FOLLOW UP STUDY  03/03/2017  . IR ANGIOGRAM FOLLOW UP STUDY  03/03/2017  . IR TRANSCATH/EMBOLIZ  03/03/2017  . PEG PLACEMENT N/A 04/01/2017   Procedure: PERCUTANEOUS ENDOSCOPIC GASTROSTOMY (PEG) PLACEMENT;  Surgeon: Georganna Skeans, MD;  Location: [facility];  Service: General;  Laterality: N/A;  . RADIOLOGY WITH ANESTHESIA N/A 03/03/2017   Procedure: RADIOLOGY WITH ANESTHESIA;  Surgeon: Consuella Lose, MD;  Location: [facility];  Service: Radiology;  Laterality: N/A;    Assessment & Plan Clinical Impression: Patient is a 46 y.o.right handed femalewith history of HTN, GERD, migraines who was admitted on 03/03/17 with HA and focal seizure activity and developed progressive decrease in LOC requiring intubation in ED. History taken from chart review and family.  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 Intraventricular catheter placed by Dr. Cyndy Freeze and she underwent cerebral angio with coiling of L-ICA aneurysm by Dr. Kathyrn Sheriff. Hospital course significant for blood drainage from IVC, multiple episodes of self extubation, HCAP, hypotension, lack of responsiveness as well as signs of minimal neurologic improvement. She was made DNR and family elected extubation with monitoring. EVD clamped with follow up Cranial CT, reviewed, showing increase in hydrocephalus.  Per report, no change in MS therefore discontinued 7/14. Mentation  improving and PEG placed for nutritional support by Dr. Hulen Skains on 7/17. Hospital course complicated by tachypnea, tachycardia, ABLA.  As lethargy resolved, she was started on regular diet yesterday.  Po intake poor and requiring assistance with meals. she continues to have diffuse weakness with delayed processing, significant visual deficits and significant deficits in mobility. CIR recommended by rehab team.  Patient transferred to CIR on 04/09/2017 .   Patient currently requires mod assist with mobility secondary to muscle weakness, decreased coordination, decreased visual acuity, and decreased standing balance, and decreased balance strategies.  Prior to hospitalization, patient was independent  with mobility and lived with Spouse in a House home.  Home access is   (One step to enter).  Patient will benefit from skilled PT intervention to maximize safe functional mobility, minimize fall risk and decrease caregiver burden for planned discharge home with intermittent assist.  Anticipate patient will benefit from follow up [facility] at discharge.  PT - End of Session Activity Tolerance: Decreased this session;Tolerates 10 - 20 min activity with multiple rests Endurance Deficit: Yes PT Assessment Rehab Potential (ACUTE/IP ONLY): Good PT Patient demonstrates impairments in the following area(s): Balance;Behavior;Endurance;Motor;Pain;Perception;Safety;Sensory PT Transfers Functional Problem(s): Bed Mobility;Bed to Chair;Car;Furniture;Floor PT Locomotion Functional Problem(s): Ambulation;Wheelchair Mobility;Stairs PT Plan PT Intensity: Minimum of 1-2 x/day ,45 to 90 minutes PT Frequency: 5 out of 7 days PT Duration Estimated Length of Stay: 14-18 days PT Treatment/Interventions: Ambulation/gait training;Balance/vestibular training;Cognitive remediation/compensation;DME/adaptive equipment instruction;Disease management/prevention;Discharge planning;Functional mobility training;Functional electrical stimulation;Neuromuscular re-education;Stair training;Patient/family education;Pain management;Psychosocial support;Splinting/orthotics;Therapeutic Activities;UE/LE Strength taining/ROM;Visual/perceptual  remediation/compensation;Wheelchair propulsion/positioning;UE/LE Coordination activities;Therapeutic Exercise PT Transfers Anticipated Outcome(s): supervision for all transfers PT Locomotion Anticipated Outcome(s): supervision for gait and stairs, and w/c PT Recommendation Recommendations for Other Services: Neuropsych consult Follow Up Recommendations: Home health PT Patient destination: Home Equipment Recommended: To be determined     Skilled Therapeutic Intervention Session 1:  Evaluation completed (see details above and below) with education on PT POC and goals and individual treatment initiated with focus on standing activity tolerance, standing balance, bed mobility and transfers. Pt supine in bed upon PT arrival. Pt transferred from supine to sitting EOB with mod assist. Pt required mod assist to perform sit<>stand using RW. Pt stated she needed to use bathroom. Pt performed stand step transfer with mod assist and RW to bedside commode. Pt performed x 3 sit to stands using RW and Mod assist in order to clean peri area, also working on dynamic standing balance with only single UE support. Pt transferred from commode to w/c, stand step using RW and mod assist. Pt left seated in w/c with needs in reach. Pt expressed feeling down and upset about where she is at, PT provided emotional support throughout.    Session 2:  Pt seated in w/c upon PT arrival, agreeable to therapy tx. Reported she felt very fatigued and ready to get back into bed. Pt ambulated x 10 ft using RW and mod assist for balance and verbal cues to increase step length, limited by weakness and decreased endurance. Pt propelled w/c x30 ft with supervision using B UEs and verbal cues for technique to increase stride. Pt transferred from w/c to bed, stand step transfer using RW and mod assist. Pt transferred from seated EOB to supine with mod assist to help lift both LEs. Pt left supine in bed with call bell in reach.   PT  Evaluation Precautions/Restrictions Precautions Precautions: Fall Precaution Comments: PEG , abdominal binder Restrictions Weight Bearing Restrictions: No General   Vital Signs Pain  denies pain this session Home Living/Prior Functioning Home Living Available Help at Discharge: Family Type of Home: House Home Access:  (One step to enter) Home Layout: One level  Lives With: Spouse Prior Function Level of Independence: Independent with basic ADLs;Independent with homemaking with ambulation;Independent with gait  Able to Take Stairs?: Yes Driving: Yes Vocation: Full time employment Leisure: Hobbies-yes (Comment) Comments: music/dancing Cognition Overall Cognitive Status: Impaired/Different from baseline Arousal/Alertness: Awake/alert Orientation Level: Oriented to person;Disoriented to place;Oriented to situation;Disoriented to time Attention: Sustained Sustained Attention: Impaired Sustained Attention Impairment: Verbal basic;Functional basic Memory: Impaired Memory Impairment: Decreased recall of new information Awareness: Impaired Awareness Impairment: Intellectual impairment Problem Solving: Impaired Problem Solving Impairment: Verbal basic;Functional basic Executive Function:  (All areas impacted by lower level deficits) Safety/Judgment: Impaired Comments: delayed processing, decreased task initiation and visual disturbances Sensation Sensation Light Touch: Appears Intact Proprioception: Appears Intact Coordination Gross Motor Movements are Fluid and Coordinated: No (R LE) Fine Motor Movements are Fluid and Coordinated: Yes (B LE's) Heel Shin Test: decreased speed and accuracy with R LE Trunk/Postural Assessment  Cervical Assessment Cervical Assessment: Within Functional Limits Thoracic Assessment Thoracic Assessment: Within Functional Limits Lumbar Assessment Lumbar Assessment: Within Functional Limits Postural Control Postural Control: Within Functional  Limits  Balance Balance Balance Assessed: Yes Static Sitting Balance Static Sitting - Level of Assistance: 5: Stand by assistance (Pt sat EOB x 5 min with and without UE support) Dynamic Sitting Balance Dynamic Sitting - Level of Assistance: 5: Stand by assistance Static Standing Balance Static Standing - Level of Assistance: 4: Min assist (With UE support using RW) Dynamic Standing Balance Dynamic Standing - Level of Assistance: 3: Mod assist Extremity Assessment  RLE Assessment RLE Assessment: Exceptions to [facility] (Grossly 3+/5) LLE Assessment LLE Assessment: Exceptions to [facility] (grossly 4/5)   See Function Navigator for Current Functional Status.   Refer to Care Plan for Long Term Goals  Recommendations for other services: Neuropsych  Discharge Criteria: Patient will be discharged from PT if patient refuses treatment 3 consecutive times without medical reason, if treatment goals not met, if there is a change in medical status, if patient makes no progress towards goals or if patient is discharged from hospital.  The above assessment, treatment plan, treatment alternatives and goals were discussed and mutually agreed upon: by patient  Netta Corrigan, PT, DPT 04/10/2017, 9:41 AM

## 2017-04-10 NOTE — Progress Notes (Signed)
Discussed chemical DVT prophylaxis with neurosurgery--they would prefer not to use any Lovenox at this time. SCDs OK.

## 2017-04-10 NOTE — Evaluation (Signed)
Occupational Therapy Assessment and Plan  Patient Details  Name: Carly [name] MRN: [id number] Date of Birth: [birth year]  OT Diagnosis: blindness and low vision, cognitive deficits, disturbance of vision and muscle weakness (generalized) Rehab Potential: Rehab Potential (ACUTE ONLY): Good ELOS: 14-16 days   Today's Date: 04/10/2017 OT Individual Time: 6503-5465 OT Individual Time Calculation (min): 67 min     Problem List:  Patient Active Problem List   Diagnosis Date Noted  . Prediabetes   . Labile blood pressure   . Transaminitis   . 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 Hydrocephalus   . Level of consciousness decreased   . SAH (subarachnoid hemorrhage) ([place])   . Tachycardia   . PEG (percutaneous endoscopic gastrostomy) status ([place])   . Acute blood loss anemia   . Tachypnea   . Ventilator dependent ([place])   . Palliative care by specialist   . Electrolyte imbalance 03/19/2017  . History of ETT   . Encounter for intubation   . Subarachnoid hemorrhage ([place])   . Acute respiratory failure ([place])   . 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 Routine general medical examination at a health care facility 08/23/2013  . Essential hypertension, benign 08/23/2013  . Impaired fasting glucose 08/23/2013  . Plantar fasciitis, bilateral 08/23/2013  . Unspecified vitamin D deficiency 08/23/2013  . Obesity, unspecified 08/23/2013  . Unspecified essential hypertension 12/31/2012  . GERD (gastroesophageal reflux disease) 12/31/2012  . COMMON MIGRAINE 06/16/2008  . CHEST PAIN, INTERMITTENT 06/16/2008    Past Medical History:  Past Medical History:  Diagnosis Date  . GERD (gastroesophageal reflux disease)   . Hypertension   . Migraines    Past Surgical History:  Past Surgical History:  Procedure Laterality Date  . CESAREAN SECTION    . ESOPHAGOGASTRODUODENOSCOPY N/A 04/01/2017   Procedure: ESOPHAGOGASTRODUODENOSCOPY (EGD);  Surgeon: Georganna Skeans, MD;  Location:  [facility];  Service: General;  Laterality: N/A;  . IR ANGIO INTRA EXTRACRAN SEL INTERNAL CAROTID UNI R MOD SED  03/03/2017  . IR ANGIO VERTEBRAL SEL VERTEBRAL BILAT MOD SED  03/03/2017  . IR ANGIOGRAM FOLLOW UP STUDY  03/03/2017  . IR ANGIOGRAM FOLLOW UP STUDY  03/03/2017  . IR ANGIOGRAM FOLLOW UP STUDY  03/03/2017  . IR ANGIOGRAM FOLLOW UP STUDY  03/03/2017  . IR TRANSCATH/EMBOLIZ  03/03/2017  . PEG PLACEMENT N/A 04/01/2017   Procedure: PERCUTANEOUS ENDOSCOPIC GASTROSTOMY (PEG) PLACEMENT;  Surgeon: Georganna Skeans, MD;  Location: [facility];  Service: General;  Laterality: N/A;  . RADIOLOGY WITH ANESTHESIA N/A 03/03/2017   Procedure: RADIOLOGY WITH ANESTHESIA;  Surgeon: Consuella Lose, MD;  Location: [facility];  Service: Radiology;  Laterality: N/A;    Assessment & Plan Clinical Impression: Patient is a 46 y.o. year old female with recent admission to the hospital on6/18/18 with HA and focal seizure activity and developed progressive decrease in LOC requiring intubation in ED. History taken from chart review and family.  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 Intraventricular catheter placed by Dr. Cyndy Freeze and she underwent cerebral angio with coiling of L-ICA aneurysm by Dr. Kathyrn Sheriff. Hospital course significant for blood drainage from IVC, multiple episodes of self extubation, HCAP, hypotension, lack of responsiveness as well as signs of minimal neurologic improvement. She was made DNR and family elected extubation with monitoring   Patient transferred to [facility] on 04/09/2017 .    Patient currently requires mod with basic self-care  skills secondary to muscle weakness, decreased coordination, decreased visual acuity, decreased visual perceptual skills and decreased visual motor skills, decreased awareness, decreased problem solving and decreased memory and  decreased standing balance and decreased balance strategies.  Prior to hospitalization, patient could complete ADLs with independent .  Patient will benefit from skilled intervention to decrease level of assist with basic self-care skills and increase independence with basic self-care skills prior to discharge home with care partner.  Anticipate patient will require 24 hour supervision and follow up home health.  OT - End of Session Activity Tolerance: Decreased this session;Tolerates 10 - 20 min activity with multiple rests Endurance Deficit: Yes OT Assessment Rehab Potential (ACUTE ONLY): Good OT Barriers to Discharge: Decreased caregiver support OT Barriers to Discharge Comments: Will need 24 hour supervision OT Patient demonstrates impairments in the following area(s): Balance;Motor;Cognition;Endurance;Vision OT Basic ADL's Functional Problem(s): Grooming;Bathing;Dressing;Toileting;Eating OT Transfers Functional Problem(s): Toilet;Tub/Shower OT Additional Impairment(s): Fuctional Use of Upper Extremity OT Plan OT Intensity: Minimum of 1-2 x/day, 45 to 90 minutes OT Frequency: 5 out of 7 days OT Duration/Estimated Length of Stay: 14-16 days OT Treatment/Interventions: Balance/vestibular training;Cognitive remediation/compensation;Community reintegration;DME/adaptive equipment instruction;Disease mangement/prevention;Discharge planning;Functional mobility training;Neuromuscular re-education;Patient/family education;Self Care/advanced ADL retraining;Therapeutic Activities;UE/LE Coordination activities;Therapeutic Exercise;UE/LE Strength taining/ROM OT Self Feeding Anticipated Outcome(s): modified independent OT Basic Self-Care Anticipated Outcome(s): supervision OT Toileting Anticipated Outcome(s): supervision OT Bathroom Transfers Anticipated Outcome(s): supervision OT Recommendation Patient destination: Home Follow Up Recommendations: Home health OT Equipment Recommended: 3 in 1 bedside  comode;Tub/shower bench   Skilled Therapeutic Intervention Began working on self care retraining sit to stand at the sink.  She needed mod assist for locating items on the sink including soap, washcloth, and deodorant.  Mod assist for sit to stand as well as mod assist for crossing LEs to reach her feet for washing them and for donning gripper socks.  She was able to stand with the RW as well and take 5-6 short steps but endurance was limited and she needed to sit after being up for less than 1-1.5 mins.  Pt's spouse present as well as her aunt and uncle.  Spouse reports that he is on leave currently and can stay on leave to provide 24 hour supervision at discharge.  Pt with no clothing this session but spouse will bring in tomorrow.    OT Evaluation Precautions/Restrictions  Precautions Precautions: Fall Precaution Comments: PEG , abdominal binder Restrictions Weight Bearing Restrictions: No  Pain Pain Assessment Pain Assessment: 0-10 Pain Score: 5  Pain Location: Abdomen Pain Orientation: Right Pain Descriptors / Indicators: Sore Patients Stated Pain Goal: 4 Pain Intervention(s): Medication (See eMAR) Home Living/Prior [place] expects to be discharged to:: Private residence Living Arrangements: Spouse/significant other Available Help at Discharge: Family (spouse reports he will take leave to provide 24 hour supervision) Type of Home: House Home Access:  (One step to enter) Home Layout: One level Bathroom Shower/Tub: Chiropodist: Standard  Lives With: Spouse IADL History Homemaking Responsibilities: Yes Cleaning Responsibility: Primary Occupation: Full time employment Type of Occupation: Worked at [employer] per chart, unsure of job duties Prior Function Level of Independence: Independent with basic ADLs, Independent with homemaking with ambulation, Independent with gait  Able to Take Stairs?: Yes Driving: Yes Vocation: Full  time employment Leisure: Hobbies-yes (Comment) Comments: music/dancing ADL  See Function Section of chart for details  Vision Baseline Vision/History: No visual deficits Patient Visual Report: Blurring of vision Vision Assessment?: Yes Eye Alignment: Impaired (comment) Ocular Range of Motion: Restricted  on the left;Restricted looking up Alignment/Gaze Preference: Gaze right Tracking/Visual Pursuits: Other (comment) (Pt keeps eyes left of midline at rest.  She does exhibit AROM across midline but not able to follow moving object in any quadrant) Saccades: Impaired - to be further tested in functional context Convergence: Impaired - to be further tested in functional context Visual Fields: Left visual field deficit (Will continue to assess functionally.  But slight decreased consistency with picking up far left visual field. ) Additional Comments: Pt unable to read 2" letters at approximately 15 inches in any quadrant.  Pt is able to use peripheral vision at times to locate items used with bathing and dressing on both the left and right, but demonstrates difficulty with acuity when reaching directly for the object. Perception  Perception: Within Functional Limits Praxis Praxis: Intact Cognition Overall Cognitive Status: Impaired/Different from baseline Arousal/Alertness: Awake/alert Orientation Level: Situation;Person;Place Person: Oriented Place: Disoriented Situation: Oriented Year:  (unable to state) Month: March Day of Week: Incorrect Memory: Impaired Memory Impairment: Decreased recall of new information Immediate Memory Recall: Sock;Blue;Bed Memory Recall: Blue;Sock Memory Recall Sock: With Cue Memory Recall Blue: With Cue Attention: Sustained;Selective Sustained Attention: Impaired Sustained Attention Impairment: Functional basic Selective Attention: Impaired Selective Attention Impairment: Functional basic Awareness: Impaired (Pt only reporting difficulty with vision  until given cueing about balance and endurance.) Awareness Impairment: Intellectual impairment Problem Solving: Impaired Problem Solving Impairment: Functional basic Executive Function:  (All areas impacted by lower level deficits) Safety/Judgment: Impaired Comments: delayed processing, decreased task initiation and visual disturbances Sensation Sensation Light Touch: Appears Intact Stereognosis: Appears Intact Hot/Cold: Appears Intact Proprioception: Appears Intact Coordination Gross Motor Movements are Fluid and Coordinated: Yes Fine Motor Movements are Fluid and Coordinated: Yes Coordination and Movement Description: No noted coordination deficits with UE use during selfcare tasks. Motor  Motor Motor: Within Functional Limits Motor - Skilled Clinical Observations: generalized weakness throughout Mobility  Transfers Transfers: Sit to Stand;Stand to Sit Sit to Stand: 3: Mod assist;With armrests;From chair/3-in-1;With upper extremity assist Stand to Sit: 3: Mod assist;With upper extremity assist;To chair/3-in-1;With armrests  Trunk/Postural Assessment  Cervical Assessment Cervical Assessment: Within Functional Limits Thoracic Assessment Thoracic Assessment: Within Functional Limits Lumbar Assessment Lumbar Assessment: Within Functional Limits Postural Control Postural Control: Deficits on evaluation (Flexed trunk in standing during ADL tasks.)  Balance Balance Balance Assessed: Yes Static Sitting Balance Static Sitting - Balance Support: Feet supported Static Sitting - Level of Assistance: 5: Stand by assistance Dynamic Sitting Balance Dynamic Sitting - Balance Support: During functional activity Dynamic Sitting - Level of Assistance: 5: Stand by assistance Static Standing Balance Static Standing - Balance Support: During functional activity;Right upper extremity supported;Left upper extremity supported Static Standing - Level of Assistance: 4: Min assist Dynamic  Standing Balance Dynamic Standing - Balance Support: During functional activity;Bilateral upper extremity supported Dynamic Standing - Level of Assistance: 3: Mod assist Extremity/Trunk Assessment RUE Assessment RUE Assessment: Exceptions to WFL (AROM WFLs throughout,  strength 3+/5 for shoulder flexion, elbow extension and grip.  Strenght 4/5 for elbow flexion) LUE Assessment LUE Assessment: Exceptions to WFL (AROM WFLs throughout,  strength 3+/5 for shoulder flexion, elbow extension and grip.  Strenght 4/5 for elbow flexion)   See Function Navigator for Current Functional Status.   Refer to Care Plan for Long Term Goals  Recommendations for other services: None    Discharge Criteria: Patient will be discharged from OT if patient refuses treatment 3 consecutive times without medical reason, if treatment goals not met, if there is a  change in medical status, if patient makes no progress towards goals or if patient is discharged from hospital.  The above assessment, treatment plan, treatment alternatives and goals were discussed and mutually agreed upon: by patient and by family  Danee Soller OTR/L 04/10/2017, 4:36 PM

## 2017-04-11 ENCOUNTER — Inpatient Hospital Stay (HOSPITAL_COMMUNITY): Payer: Commercial Indemnity

## 2017-04-11 ENCOUNTER — Inpatient Hospital Stay (HOSPITAL_COMMUNITY): Payer: Commercial Indemnity | Admitting: Speech Pathology

## 2017-04-11 ENCOUNTER — Inpatient Hospital Stay (HOSPITAL_COMMUNITY): Payer: 59 | Admitting: Occupational Therapy

## 2017-04-11 DIAGNOSIS — R638 Other symptoms and signs concerning food and fluid intake: Secondary | ICD-10-CM | POA: Insufficient documentation

## 2017-04-11 LAB — GLUCOSE, CAPILLARY
GLUCOSE-CAPILLARY: 103 mg/dL — AB (ref 65–99)
GLUCOSE-CAPILLARY: 122 mg/dL — AB (ref 65–99)
Glucose-Capillary: 97 mg/dL (ref 65–99)
Glucose-Capillary: 130 mg/dL — ABNORMAL HIGH (ref 65–99)

## 2017-04-11 NOTE — Progress Notes (Signed)
Physical Therapy Session Note  Patient Details  Name: Carly [name] MRN: [id number] Date of Birth: [birth year]  Today's Date: 04/11/2017 PT Individual Time: 5397-6734 PT Individual Time Calculation (min): 60 min   Short Term Goals: Week 1:  PT Short Term Goal 1 (Week 1): Pt will ambulated 50 ft with min assist and LRAD PT Short Term Goal 2 (Week 1): Pt will propel w/c 150 ft with supervision  PT Short Term Goal 3 (Week 1): Pt will maintain dynamic standing balance during functional activitiy with min A and without UE support  Skilled Therapeutic Interventions/Progress Updates:    Pt sitting in w/c upon PT arrival, agreeable to therapy tx. Pt propelled w/c x52 ft with min assist to the gym using B UEs, assist needed to help with steering. Pt performed sit to stand with min assist, verbal cues for technique and hand placement on walker. Pt stood x 30 sec until reporting being too weak. Pt reported stomach pain and a headache 6/10, RN notified and gave pain meds. Blood pressure monitored in sitting BP 122/78 and HR 93, and again in standing 143/70 and HR 97. Pt requested to do some seated exercises secondary to not feeling well. Pt performed 2 x 10 LAQ and 2 x 10 hip flexion while sitting in chair. Pt propelled w/c 30 ft back to room with min assist, limited by fatigue and pain. Pt performed stand step transfer using RW and min assist. Pt transferred from sitting EOB to supine with mod assist to lift LEs. Pt rolled in both directions with min assist in order to doff abdominal binder as it was the wrong size. Pt left supine in bed with call bell in reach.     Therapy Documentation Precautions:  Precautions Precautions: Fall Precaution Comments: PEG , abdominal binder Restrictions Weight Bearing Restrictions: No    See Function Navigator for Current Functional Status.   Therapy/Group: Individual Therapy  Netta Corrigan, PT, DPT 04/11/2017, 11:36 AM

## 2017-04-11 NOTE — Progress Notes (Signed)
[facility]IC[facility] RE[facility]ILIT[facility]     PROGRESS NOTE  Subjective/Complaints:  Pt seen laying in bed this AM.  She slept well overnight and notes a tiring day in therapies yesterday.  Per nursing, pt with some soreness around PEG site.   ROS: Denies CP, SOB, N/V/D.  Objective: Vital Signs: Blood pressure 119/73, pulse (!) 104, temperature 98.7 F (37.1 C), temperature source Oral, resp. rate 14, weight 99.7 kg (219 lb 12.8 oz), SpO2 99 %. No results found.  Recent Labs  04/10/17 0400  WBC 7.5  HGB 9.5*  HCT 30.4*  PLT 255    Recent Labs  04/10/17 0400  NA 139  K 3.8  CL 101  GLUCOSE 103*  BUN 9  CREATININE 0.74  CALCIUM 9.2   CBG (last 3)   Recent Labs  04/10/17 1653 04/10/17 2143 04/11/17 0653  GLUCAP 100* 102* 103*    Wt Readings from Last 3 Encounters:  04/11/17 99.7 kg (219 lb 12.8 oz)  04/09/17 98.2 kg (216 lb 7.9 oz)  02/25/17 109 kg (240 lb 3.2 oz)    Physical Exam:  BP 119/73 (BP Location: Right Arm)   Pulse (!) 104   Temp 98.7 F (37.1 C) (Oral)   Resp 14   Wt 99.7 kg (219 lb 12.8 oz)   LMP  (LMP Unknown) Comment: pt is unresponsive, and is unable to communicate  SpO2 99%   BMI 34.43 kg/m  Constitutional: She appears well-developed. Obese  HENT: Normocephalic and atraumatic.  Eyes: No discharge. EOMI. Cardiovascular: RRR.  No JVD. Respiratory: Effort normal and breath sounds normal.  GI: Soft. Bowel sounds are normal. +PEG with some tenderness Musculoskeletal: She exhibits no edema or tenderness.  Neurological: She is alert.  Right gaze preference.  Does not make eye contact Rapid fluttering eye movements noted when head turned to left.  Soft voice  Oriented to self only. Motor: B/l UE 4/5 proximal to distal RLE: 4/5 HF, KE, 4+/5 ADF/PF LLE: 4/5 HF, KE, 4+/5 ADF/PF Skin: Skin is warm and dry. She is not diaphoretic.  Psychiatric: Her affect is blunt. Her speech is delayed. She is slowed. She does not express impulsivity.  She is attentive.    Assessment/Plan: 1. Functional deficits secondary to left frontal hemorrhage s/p coiling which require 3+ hours per day of interdisciplinary therapy in a comprehensive inpatient rehab setting. Physiatrist is providing close team supervision and 24 hour management of active medical problems listed below. Physiatrist and rehab team continue to assess barriers to discharge/monitor patient progress toward functional and medical goals.  Function:  Bathing Bathing position   Position: Wheelchair/chair at sink  Bathing parts Body parts bathed by patient: Right arm, Left arm, Chest, Abdomen, Right upper leg, Left upper leg Body parts bathed by helper: Right lower leg, Left lower leg, Buttocks, Front perineal area, Back  Bathing assist        Upper Body Dressing/Undressing Upper body dressing   What is the patient wearing?: Hospital gown                Upper body assist        Lower Body Dressing/Undressing Lower body dressing   What is the patient wearing?: Non-skid slipper socks           Non-skid slipper socks- Performed by helper: Don/doff right sock, Don/doff left sock                  Lower body assist  Toileting Toileting          Toileting assist     Transfers Chair/bed transfer   Chair/bed transfer method: Stand pivot Chair/bed transfer assist level: Moderate assist (Pt 50 - 74%/lift or lower) Chair/bed transfer assistive device: Medical sales representative     Max distance: 10 ft Assist level: Moderate assist (Pt 50 - 74%)   Wheelchair   Type: Manual Max wheelchair distance: 30 ft Assist Level: Touching or steadying assistance (Pt > 75%)  Cognition Comprehension Comprehension assist level: Understands basic 75 - 89% of the time/ requires cueing 10 - 24% of the time  Expression Expression assist level: Expresses basic 75 - 89% of the time/requires cueing 10 - 24% of the time. Needs helper to occlude  trach/needs to repeat words.  Social Interaction Social Interaction assist level: Interacts appropriately 75 - 89% of the time - Needs redirection for appropriate language or to initiate interaction.  Problem Solving Problem solving assist level: Solves basic 25 - 49% of the time - needs direction more than half the time to initiate, plan or complete simple activities  Memory Memory assist level: Recognizes or recalls less than 25% of the time/requires cueing greater than 75% of the time    Medical Problem List and Plan: 1.  Generalized weakness, aphonia, needs tactile and showing increased ability to follow commands.   secondary to left frontal hemorrhage s/p coiling.  Cont CIR 2.  DVT Prophylaxis/Anticoagulation: Mechanical: Sequential compression devices, below knee Bilateral lower extremities  Per Neurosurg, no chemical prophylaxis 3. H/o migraines/Pain Management: tylenol prn for HA 4. Mood: lacks insight/awareness of deficits. LCSW to follow for evaluation and support when appropriate.  5. Neuropsych: This patient is not capable of making decisions on her own behalf. 6. Skin/Wound Care: routine pressure relief  measures 7. Fluids/Electrolytes/Nutrition: Started calorie count.  May need to start tube feeds.   Variable PO intake. 8. Prediabetes: Hgb A1c- 5.9. Will continue to monitor BS ac/hs for now  Relatively controlled 7/27 9. ABLA:   Hb 9.5 on 7/26 10. 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  12. Labile blood pressure  Cont to monitor with increased mobility  Controlled 7/27 13. Transaminitis  Cont to monitor  LOS (Days) 2 A FACE TO FACE EVALUATION WAS PERFORMED  Carly [name] Carly [name] 04/11/2017 8:00 AM

## 2017-04-11 NOTE — Progress Notes (Signed)
Occupational Therapy Session Note  Patient Details  Name: Carly [name] MRN: [id number] Date of Birth: 12/30/1970  Today's Date: 04/11/2017 OT Individual Time: 0901-1001 OT Individual Time Calculation (min): 60 min    Short Term Goals: Week 1:  OT Short Term Goal 1 (Week 1): Pt will perform LB bathing sit to stand with min assist.  OT Short Term Goal 2 (Week 1): Pt will perform LB dressing sit to stand with min assist.   OT Short Term Goal 3 (Week 1): Pt will complete toilet transfers with min assist RW to 3:1. OT Short Term Goal 4 (Week 1): Pt completed walk-in shower transfers with the RW and min assist. OT Short Term Goal 5 (Week 1): Pt will locate 3-4 grooming items on top of sink with no more than min instructional cueing.    Skilled Therapeutic Interventions/Progress Updates:    Pt completed bathing and dressing during session.  She was able to transfer to the tub/shower bench with min assist stand pivot.  Once on the seat she was able to complete bathing sit to stand with max instructional cueing for sequencing.  She perseverated on washing her upper body and needed cueing to sequence on to the LB.  Once bathing was completed she donned new gowns.  She was able to donn her gripper socks with min assist to get them to be turned the correct way with grippers on the bottom.  She was able to locate items visually throughout session with min assist (shower head, soap left of midline, washcloth left of midline).  Pt oriented to type of place "hospital" but not specific hospital.  She was also not oriented to month, or day of week.  Nursing made aware of pt not having abdominal binder for covering peg tube.  Finished session with pt in wheelchair with safety belt in place and soft touch call button in lap.    Therapy Documentation Precautions:  Precautions Precautions: Fall Precaution Comments: PEG , abdominal binder Restrictions Weight Bearing Restrictions: No  Pain: Pain  Assessment Pain Assessment: No/denies pain ADL: See Function Navigator for Current Functional Status.   Therapy/Group: Individual Therapy  Leasia Swann OTR/L 04/11/2017, 12:29 PM

## 2017-04-11 NOTE — Progress Notes (Signed)
Occupational Therapy Session Note  Patient Details  Name: Carly [name] MRN: [id number] Date of Birth: 22-Mar-1971  Today's Date: 04/11/2017 OT Individual Time: 5945-8592 OT Individual Time Calculation (min): 28 min    Short Term Goals: Week 1:  OT Short Term Goal 1 (Week 1): Pt will perform LB bathing sit to stand with min assist.  OT Short Term Goal 2 (Week 1): Pt will perform LB dressing sit to stand with min assist.   OT Short Term Goal 3 (Week 1): Pt will complete toilet transfers with min assist RW to 3:1. OT Short Term Goal 4 (Week 1): Pt completed walk-in shower transfers with the RW and min assist. OT Short Term Goal 5 (Week 1): Pt will locate 3-4 grooming items on top of sink with no more than min instructional cueing.    Skilled Therapeutic Interventions/Progress Updates:    Pt transferred from supine to sit EOB with supervision during session.  Once on the EOB she was able to tie her second gown fitted like a robe, which she could not complete during earlier session.  Transferred stand pivot to the wheelchair with min assist.  Took pt down the therapy gym and worked on BUE exercises.  Pt completed 3 sets of 5 repetitions for bilateral shoulder flexion using 4lb dowel rod with min facilitation.  She was able to complete one set of 15 repetitions for bilateral elbow flexion as well.  Returned to room at end of session with pt left in wheelchair with soft touch call button in place and safety belt in place.      Therapy Documentation Precautions:  Precautions Precautions: Fall Precaution Comments: PEG , abdominal binder Restrictions Weight Bearing Restrictions: No  Vital Signs: Therapy Vitals Temp: 98.6 F (37 C) Temp Source: Oral Pulse Rate: 90 Resp: 16 BP: 112/60 Patient Position (if appropriate): Lying Oxygen Therapy SpO2: 95 % O2 Device: Not Delivered Pain: Pain Assessment Pain Assessment: No/denies pain ADL: See Function Navigator for Current Functional  Status.   Therapy/Group: Individual Therapy  Beth Goodlin OTR/L 04/11/2017, 4:01 PM

## 2017-04-11 NOTE — Progress Notes (Signed)
Speech Language Pathology Daily Session Note  Patient Details  Name: Carly [name] MRN: [id number] Date of Birth: 1971-04-11  Today's Date: 04/11/2017 SLP Individual Time: 0803-0900 SLP Individual Time Calculation (min): 57 min  Short Term Goals: Week 1: SLP Short Term Goal 1 (Week 1): Pt will utilize external memory aids to recall new daily information with Min A cues.  SLP Short Term Goal 2 (Week 1): Pt will recall orientation information with Min A cues.  SLP Short Term Goal 3 (Week 1): Pt will initiate basic familiar tasks within 5 seconds in 75% of opportunites with Mod A cues.  SLP Short Term Goal 4 (Week 1): Pt will complete basic, familiar tasks within ADLs activities with Mod A cues.  SLP Short Term Goal 5 (Week 1): Pt will utilize increased vocal intensity with Mod A cues to achieve ~ 75% intelligilbity at the sentenc level. SLP Short Term Goal 6 (Week 1): Pt will locate objects in all fields of vision in 50% of opportunties with Mod A cues.   Skilled Therapeutic Interventions:  Pt was seen for skilled ST targeting cognitive goals.  Pt was in bed, awake, lethargic, but agreeable to participating in therapies.  Pt was transferred to wheelchair to further maximize alertness for participation in therapies.  Pt brushed her teeth and washed her face and hands with intermittent min-mod assist verbal and tactile cues to locate items in both left and right visual fields.  Supervision/set up assist were utilized during all tasks to maximize task initiation.  Pt was oriented to place, time, and situation with mod I and was able to identify "slow processing" as a result of hemorrhage.  Skilled education was provided regarding sequela of stroke recovery and realistic prognostic expectations.  Pt was handed off to OT seated in wheelchair.  Continue per current plan of care.       Function:  Eating Eating                 Cognition Comprehension Comprehension assist level: Follows basic  conversation/direction with extra time/assistive device  Expression   Expression assist level: Expresses basic needs/ideas: With extra time/assistive device  Social Interaction Social Interaction assist level: Interacts appropriately 90% of the time - Needs monitoring or encouragement for participation or interaction.  Problem Solving Problem solving assist level: Solves basic 75 - 89% of the time/requires cueing 10 - 24% of the time  Memory Memory assist level: Recognizes or recalls 50 - 74% of the time/requires cueing 25 - 49% of the time    Pain Pain Assessment Pain Assessment: No/denies pain  Therapy/Group: Individual Therapy  Christol Thetford, Selinda Orion 04/11/2017, 9:16 AM

## 2017-04-12 ENCOUNTER — Inpatient Hospital Stay (HOSPITAL_COMMUNITY): Payer: 59 | Admitting: Occupational Therapy

## 2017-04-12 ENCOUNTER — Encounter (HOSPITAL_COMMUNITY): Payer: Self-pay

## 2017-04-12 ENCOUNTER — Inpatient Hospital Stay (HOSPITAL_COMMUNITY): Payer: Commercial Indemnity | Admitting: Speech Pathology

## 2017-04-12 ENCOUNTER — Inpatient Hospital Stay (HOSPITAL_COMMUNITY): Payer: Commercial Indemnity | Admitting: Physical Therapy

## 2017-04-12 LAB — GLUCOSE, CAPILLARY
GLUCOSE-CAPILLARY: 109 mg/dL — AB (ref 65–99)
Glucose-Capillary: 110 mg/dL — ABNORMAL HIGH (ref 65–99)
Glucose-Capillary: 119 mg/dL — ABNORMAL HIGH (ref 65–99)
Glucose-Capillary: 105 mg/dL — ABNORMAL HIGH (ref 65–99)

## 2017-04-12 NOTE — Care Management Note (Signed)
Inpatient Rehabilitation Center Individual Statement of Services  Patient Name:  Carly [name]  Date:  04/12/2017  Welcome to the [facility].  Our goal is to provide you with an individualized program based on your diagnosis and situation, designed to meet your specific needs.  With this comprehensive rehabilitation program, you will be expected to participate in at least 3 hours of rehabilitation therapies Monday-Friday, with modified therapy programming on the weekends.  Your rehabilitation program will include the following services:  Physical Therapy (PT), Occupational Therapy (OT), Speech Therapy (ST), 24 hour per day rehabilitation nursing, Therapeutic Recreaction (TR), Neuropsychology, Case Management (Social Worker), Rehabilitation Medicine, Nutrition Services and Pharmacy Services  Weekly team conferences will be held on Wednesdays to discuss your progress.  Your Social Worker will talk with you frequently to get your input and to update you on team discussions.  Team conferences with you and your family in attendance may also be held.  Expected length of stay: 14-16 days  Overall anticipated outcome: supervision  Depending on your progress and recovery, your program may change. Your Social Worker will coordinate services and will keep you informed of any changes. Your Social Worker's name and contact numbers are listed  below.  The following services may also be recommended but are not provided by the [facility] will be made to provide these services after discharge if needed.  Arrangements include referral to agencies that provide these services.  Your insurance has been verified to be:  [facility] Your primary doctor is:  Curator  Pertinent information will be shared with your doctor and your insurance  company.  Social Worker:  [place], [place] or ([phone]   Information discussed with and copy given to patient by: Lennart Pall, 04/12/2017, 10:54 AM

## 2017-04-12 NOTE — Progress Notes (Signed)
Social Work  Social Work Assessment and Plan  Patient Details  Name: Carly [name] MRN: [id number] Date of Birth: [birth year]  Today's Date: 04/12/2017  Problem List:  Patient Active Problem List   Diagnosis Date Noted  . Decreased oral intake   . Cognitive deficit, post-stroke   . Prediabetes   . Labile blood pressure   . Transaminitis   . 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 Hydrocephalus   . Level of consciousness decreased   . SAH (subarachnoid hemorrhage) ([place])   . Tachycardia   . PEG (percutaneous endoscopic gastrostomy) status ([place])   . Acute blood loss anemia   . Tachypnea   . Ventilator dependent ([place])   . Palliative care by specialist   . Electrolyte imbalance 03/19/2017  . History of ETT   . Encounter for intubation   . Subarachnoid hemorrhage ([place])   . Acute respiratory failure ([place])   . 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 Routine general medical examination at a health care facility 08/23/2013  . Essential hypertension, benign 08/23/2013  . Impaired fasting glucose 08/23/2013  . Plantar fasciitis, bilateral 08/23/2013  . Unspecified vitamin D deficiency 08/23/2013  . Obesity, unspecified 08/23/2013  . Unspecified essential hypertension 12/31/2012  . GERD (gastroesophageal reflux disease) 12/31/2012  . COMMON MIGRAINE 06/16/2008  . CHEST PAIN, INTERMITTENT 06/16/2008   Past Medical History:  Past Medical History:  Diagnosis Date  . GERD (gastroesophageal reflux disease)   . Hypertension   . Migraines    Past Surgical History:  Past Surgical History:  Procedure Laterality Date  . CESAREAN SECTION    . ESOPHAGOGASTRODUODENOSCOPY N/A 04/01/2017   Procedure: ESOPHAGOGASTRODUODENOSCOPY (EGD);  Surgeon: Georganna Skeans, MD;  Location: [facility];  Service: General;  Laterality: N/A;  . IR ANGIO INTRA EXTRACRAN SEL INTERNAL CAROTID UNI R MOD SED  03/03/2017  . IR ANGIO VERTEBRAL SEL VERTEBRAL BILAT MOD SED  03/03/2017  . IR ANGIOGRAM  FOLLOW UP STUDY  03/03/2017  . IR ANGIOGRAM FOLLOW UP STUDY  03/03/2017  . IR ANGIOGRAM FOLLOW UP STUDY  03/03/2017  . IR ANGIOGRAM FOLLOW UP STUDY  03/03/2017  . IR TRANSCATH/EMBOLIZ  03/03/2017  . PEG PLACEMENT N/A 04/01/2017   Procedure: PERCUTANEOUS ENDOSCOPIC GASTROSTOMY (PEG) PLACEMENT;  Surgeon: Georganna Skeans, MD;  Location: [facility];  Service: General;  Laterality: N/A;  . RADIOLOGY WITH ANESTHESIA N/A 03/03/2017   Procedure: RADIOLOGY WITH ANESTHESIA;  Surgeon: Consuella Lose, MD;  Location: [facility];  Service: Radiology;  Laterality: N/A;   Social History:  reports that she has never smoked. She has never used smokeless tobacco. She reports that she drinks alcohol. She reports that she does not use drugs.  Family / Support Systems Marital Status: Married How Long?: 24 yrs Patient Roles: Spouse, Parent Spouse/Significant Other: Emary Zalar @ (H) [phone] or Donna Bernard Children: daughter, Harl Bowie (95) at home;  son, Dejarrett (27) local Anticipated Caregiver: Husband is primary support, however, he does work.   Ability/Limitations of Caregiver: Husband works as a Administrator.  He does not have a plan for 24/7 coverage at this tiem. Caregiver Availability: Other (Comment) (Husband aware of need for 24/7) Family Dynamics: Husband and daughter at bedside and appear very supportive.  Pt describes them all as supportive.  Social History Preferred language: English Religion: Holiness Cultural Background: NA Read: Yes Write: Yes Employment Status: Employed Name of Employer: [employer] of Employment: 8 (yrs) Return to Work Plans: TBD - doubtful Freight forwarder Issues: None Guardian/Conservator:  None - per MD, pt not capable of making decisions on her own behalf - defer to spouse   Abuse/Neglect Physical Abuse: Denies Verbal Abuse: Denies Sexual Abuse: Denies Exploitation of patient/patient's resources: Denies Self-Neglect: Denies  Emotional Status Pt's  affect, behavior adn adjustment status: Pt with very flat affect and makes little - no eye contact (uncertain if visual deficits which is likely).  Provides correct information per family present but limited engagement.  She denies any s/s of any emotional distress.  Feel would benefit from neuropsychology consult during stay.   Recent Psychosocial Issues: None Pyschiatric History: None Substance Abuse History: None  Patient / Family Perceptions, Expectations & Goals Pt/Family understanding of illness & functional limitations: Pt states, "I had an aneurysm" but does not elaborate.  Spouse and family appear to have a basic understanding of aneurysm rupture but question cause and if it "could have been prevented".   Premorbid pt/family roles/activities: Pt was completely independent and working f/t.  Husband a local truck driver.   Anticipated changes in roles/activities/participation: Pt wil require 24/7 supervision at a minimum, therefore, husband will need to assume primary caregiver support role and arrange support. Pt/family expectations/goals: "I want to get better."  [employer]: None Premorbid Home Care/DME Agencies: None Transportation available at discharge: yes Resource referrals recommended: Neuropsychology, Support group (specify)  Discharge Planning Living Arrangements: Spouse/significant other, Children Support Systems: Spouse/significant other, Children, Other relatives, Friends/neighbors Type of Residence: Private residence Insurance Resources: Multimedia programmer (specify) Sports administrator) Financial Resources: Employment Museum/gallery curator Screen Referred: No Living Expenses: Higher education careers adviser Management: Spouse Does the patient have any problems obtaining your medications?: No Home Management: pt and family share Patient/Family Preliminary Plans: Pt to return home if family able to arrange 24/7 support.  Spouse questions if there is possibilty for SNF if he is not able  to do so. Sw Barriers to Discharge: Lack of/limited family support Sw Barriers to Discharge Comments: husband works f/t and unsure if he can arrange 24/7 support Social Work Anticipated Follow Up Needs: HH/OP, SNF Expected length of stay: 14-16 days  Clinical Impression Very unfortunate woman here following a ruptured aneurysm and with significant physical and cognitive deficits.  Spouse and children very supportive, however, do not have a plan yet for coverage of 24/7 care and d/c plan may need to be changed to SNF.  Pt very flat and difficult to engage but is able to answer basic questions.  Expect to refer for neuropsychology consult when appropriate.  Will follow for support and d/c planning needs.  Colbey Wirtanen 04/12/2017, 10:51 AM

## 2017-04-12 NOTE — Progress Notes (Signed)
Occupational Therapy Session Note  Patient Details  Name: BABITA AMAKER MRN: [id number] Date of Birth: [birth year]  Today's Date: 04/12/2017 OT Individual Time: 1100-1159 OT Individual Time Calculation (min): 59 min    Short Term Goals: Week 1:  OT Short Term Goal 1 (Week 1): Pt will perform LB bathing sit to stand with min assist.  OT Short Term Goal 2 (Week 1): Pt will perform LB dressing sit to stand with min assist.   OT Short Term Goal 3 (Week 1): Pt will complete toilet transfers with min assist RW to 3:1. OT Short Term Goal 4 (Week 1): Pt completed walk-in shower transfers with the RW and min assist. OT Short Term Goal 5 (Week 1): Pt will locate 3-4 grooming items on top of sink with no more than min instructional cueing.    Skilled Therapeutic Interventions/Progress Updates:    Pt completed shower and dressing during session.  She was able to ambulate into the shower with min assist using the RW.  During bathing she continued to need mod questioning cueing to sequence as she would repeatedly wash her UB, and at times her buttocks without being able to sequence to the next part.  Min guard assist for sit to stand in the shower to wash and for drying.  She ambulated out to the wheelchair for dressing.  She was able to [employer] with setup and min instructional cueing for location of printed tag.  She was able to donn pull-up pants with min guard assist after therapist assisted with brief.  Decreased ability to cross the RLE over the left knee for donning gripper sock secondary to pain in her right side.  Pt assisted with sock at mod assist level.  Finished session with pt in wheelchair with safety bel tin place and call button in reach.    Therapy Documentation Precautions:  Precautions Precautions: Fall Precaution Comments: PEG ,  impaired central vision Restrictions Weight Bearing Restrictions: No  Pain: Pain Assessment Pain Assessment: No/denies pain Pain Score: 7   Pain Type: Acute pain Pain Location: Abdomen Pain Orientation: Right Pain Descriptors / Indicators: Aching Pain Frequency: Intermittent Pain Onset: Gradual Patients Stated Pain Goal: 0 Pain Intervention(s): Medication (See eMAR) Multiple Pain Sites: No ADL: See Function Navigator for Current Functional Status.   Therapy/Group: Individual Therapy  Merl Guardino OTR/L 04/12/2017, 12:39 PM

## 2017-04-12 NOTE — Progress Notes (Signed)
[facility] PHYSICAL MEDICINE & REHABILITATION     PROGRESS NOTE  Subjective/Complaints:  Pt seen laying in bed this AM.  She slept well overnight and states she "feels good this morning".  ROS: Denies CP, SOB, N/V/D.  Objective: Vital Signs: Blood pressure 118/70, pulse 99, temperature 99.2 F (37.3 C), temperature source Oral, resp. rate 18, weight 98.8 kg (217 lb 13 oz), SpO2 98 %. No results found.  Recent Labs  04/10/17 0400  WBC 7.5  HGB 9.5*  HCT 30.4*  PLT 255    Recent Labs  04/10/17 0400  NA 139  K 3.8  CL 101  GLUCOSE 103*  BUN 9  CREATININE 0.74  CALCIUM 9.2   CBG (last 3)   Recent Labs  04/11/17 1654 04/11/17 2100 04/12/17 0631  GLUCAP 97 130* 110*    Wt Readings from Last 3 Encounters:  04/12/17 98.8 kg (217 lb 13 oz)  04/09/17 98.2 kg (216 lb 7.9 oz)  02/25/17 109 kg (240 lb 3.2 oz)    Physical Exam:  BP 118/70 (BP Location: Right Arm)   Pulse 99   Temp 99.2 F (37.3 C) (Oral)   Resp 18   Wt 98.8 kg (217 lb 13 oz)   LMP  (LMP Unknown) Comment: pt is unresponsive, and is unable to communicate  SpO2 98%   BMI 34.11 kg/m  Constitutional: She appears well-developed. Obese  HENT: Normocephalic and atraumatic.  Eyes: No discharge. EOMI. Cardiovascular: RRR. No JVD. Respiratory: Effort normal and breath sounds normal.  GI: Soft. Bowel sounds are normal. +PEG  Musculoskeletal: She exhibits no edema or tenderness.  Neurological: She is alert.  Right gaze preference.   Does not make eye contact Soft voice  Oriented to self only. Motor: B/l UE 4/5 proximal to distal RLE: 4/5 HF, KE, 4+/5 ADF/PF LLE: 4/5 HF, KE, 4+/5 ADF/PF Skin: Skin is warm and dry. She is not diaphoretic.  Psychiatric: Her affect is blunt. Her speech is delayed. She is slowed. She does not express impulsivity. She is attentive.    Assessment/Plan: 1. Functional deficits secondary to left frontal hemorrhage s/p coiling which require 3+ hours per day of  interdisciplinary therapy in a comprehensive inpatient rehab setting. Physiatrist is providing close team supervision and 24 hour management of active medical problems listed below. Physiatrist and rehab team continue to assess barriers to discharge/monitor patient progress toward functional and medical goals.  Function:  Bathing Bathing position   Position: Shower  Bathing parts Body parts bathed by patient: Right arm, Left arm, Chest, Abdomen, Front perineal area, Buttocks, Right upper leg, Left upper leg, Right lower leg, Left lower leg Body parts bathed by helper: Back  Bathing assist Assist Level: Touching or steadying assistance(Pt > 75%)      Upper Body Dressing/Undressing Upper body dressing   What is the patient wearing?: Hospital gown                Upper body assist        Lower Body Dressing/Undressing Lower body dressing   What is the patient wearing?: Non-skid slipper socks         Non-skid slipper socks- Performed by patient: Don/doff left sock Non-skid slipper socks- Performed by helper: Don/doff right sock                  Lower body assist        Toileting Toileting          Toileting assist  Transfers Chair/bed transfer   Chair/bed transfer method: Stand pivot Chair/bed transfer assist level: Touching or steadying assistance (Pt > 75%) Chair/bed transfer assistive device: Medical sales representative     Max distance: 10 ft Assist level: Moderate assist (Pt 50 - 74%)   Wheelchair   Type: Manual Max wheelchair distance: 52 ft Assist Level: Touching or steadying assistance (Pt > 75%)  Cognition Comprehension Comprehension assist level: Follows basic conversation/direction with extra time/assistive device  Expression Expression assist level: Expresses basic needs/ideas: With extra time/assistive device  Social Interaction Social Interaction assist level: Interacts appropriately 90% of the time - Needs monitoring or  encouragement for participation or interaction.  Problem Solving Problem solving assist level: Solves basic 75 - 89% of the time/requires cueing 10 - 24% of the time  Memory Memory assist level: Recognizes or recalls 50 - 74% of the time/requires cueing 25 - 49% of the time    Medical Problem List and Plan: 1.  Generalized weakness, aphonia, needs tactile and showing increased ability to follow commands.   secondary to left frontal hemorrhage s/p coiling.  Cont CIR 2.  DVT Prophylaxis/Anticoagulation: Mechanical: Sequential compression devices, below knee Bilateral lower extremities  Per Neurosurg, no chemical prophylaxis 3. H/o migraines/Pain Management: tylenol prn for HA 4. Mood: lacks insight/awareness of deficits. LCSW to follow for evaluation and support when appropriate.  5. Neuropsych: This patient is not capable of making decisions on her own behalf. 6. Skin/Wound Care: routine pressure relief  measures 7. Fluids/Electrolytes/Nutrition: Started calorie count.  May need to start tube feeds.   Variable PO intake, appears to be improving. 8. Prediabetes: Hgb A1c- 5.9. Will continue to monitor BS ac/hs for now  Relatively controlled 7/28 9. ABLA:   Hb 9.5 on 7/26 10. 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  12. Labile blood pressure  Cont to monitor with increased mobility  Controlled 7/28 13. Transaminitis  Cont to monitor  LOS (Days) 3 A FACE TO FACE EVALUATION WAS PERFORMED  Ankit Lorie Phenix 04/12/2017 7:50 AM

## 2017-04-12 NOTE — Progress Notes (Signed)
Physical Therapy Session Note  Patient Details  Name: Carly [name] MRN: [id number] Date of Birth: 1971-08-21  Today's Date: 04/12/2017 PT Individual Time: 0801-0900 AND 1620-1110 PT Individual Time Calculation (min): 59 min AND 50 min   Short Term Goals: Week 1:  PT Short Term Goal 1 (Week 1): Pt will ambulated 50 ft with min assist and LRAD PT Short Term Goal 2 (Week 1): Pt will propel w/c 150 ft with supervision  PT Short Term Goal 3 (Week 1): Pt will maintain dynamic standing balance during functional activitiy with min A and without UE support  Skilled Therapeutic Interventions/Progress Updates:    session 1  Pt received supine in bed and agreeable to PT. PT instructed pt in supine>sit transfer with min assist due to minor LOB to the L.  PT assisted pt to don shirt and pants with min assist from PT to thread arms and legs due to decreased visual acuity.   PT instructed pt in sit<>stand and stand pivot transfers with AD throughout session with min assist and min cues for proper UE placement.   WC mobility instructed by PT x 152f with min-supervision assist from PT and max cues for direction and increased use of RUE to prevent veer to the R.   PT instructed pt in gait training [id number] min assist and BUE support on RW. Min-moderate cues for direction and increased foot clearance on the LLE.   Vital signs taken before and after gait training. Pre: BP 127/80 and HR 94 bpm. Post: BP 121/71, HR 102 bpm.   Stair negotiation training instructed by PT with BUE support on rails x 1 step with mod assist from PT and verbal cues for step-to gait pattern.   Car transfer training instructed by Pt with min assist and no AD. Moderate cues for proper UE placement and gait pattern for safe transfers.   Patient returned too room and perform stand pivot transfer  to recliner. Pt left sitting in recliner with call bell in reach and all needs met.    Session 2.   Pt received supine in bed and  agreeable to PT. Supine>sit transfer with supervision assist and min cues for safety and proper use of UE  Stand pivot transfers completed x 6 throughout treatment to various heights with min assist for safety and min cues for gait pattern to improve safety and reduce energy expenditure.   Nustep reciprocal movement pattern training 6 min+5 min, level 3>4 with min cues for proper speed of movement and equalized force through BLE.   Gait training instructed by PT x [id number] RW and min assist from PT.   WC mobility instructed by PT with BUE propulsionx [id number] supervision assist and constant verbal and visual cues for direction.   Pt noted to have had incontinent episode once back in room. Toilet transfer with min assist from PT and min assist for PT to manage clothing.   Pt returned to room and performed stand pivot transfer to bed with min assist. Sit>supine completed with min assist from PT. Pt left supine in bed with call bell in reach and all needs met.       Therapy Documentation Precautions:  Precautions Precautions: Fall Precaution Comments: PEG , abdominal binder Restrictions Weight Bearing Restrictions: No    Pain: Pain Assessment Pain Assessment: 0-10 Pain Score: 8  Pain Location: Abdomen Pain Descriptors / Indicators: Aching   See Function Navigator for Current Functional Status.   Therapy/Group: Individual Therapy  AustLiane [name]  E Carly [name] 04/12/2017, 9:05 AM

## 2017-04-12 NOTE — Progress Notes (Signed)
Speech Language Pathology Daily Session Note  Patient Details  Name: Carly [name] MRN: [id number] Date of Birth: 06-16-1971  Today's Date: 04/12/2017 SLP Individual Time: 1005-1030 SLP Individual Time Calculation (min): 25 min  Short Term Goals: Week 1: SLP Short Term Goal 1 (Week 1): Pt will utilize external memory aids to recall new daily information with Min A cues.  SLP Short Term Goal 2 (Week 1): Pt will recall orientation information with Min A cues.  SLP Short Term Goal 3 (Week 1): Pt will initiate basic familiar tasks within 5 seconds in 75% of opportunites with Mod A cues.  SLP Short Term Goal 4 (Week 1): Pt will complete basic, familiar tasks within ADLs activities with Mod A cues.  SLP Short Term Goal 5 (Week 1): Pt will utilize increased vocal intensity with Mod A cues to achieve ~ 75% intelligilbity at the sentenc level. SLP Short Term Goal 6 (Week 1): Pt will locate objects in all fields of vision in 50% of opportunties with Mod A cues.   Skilled Therapeutic Interventions:  Pt was seen for skilled ST targeting cognitive goals.  SLP facilitated the session with a basic, money sorting task to address visual scanning, task initiation, and basic problem solving.  Pt needed max to total assist verbal cues to locate objects in both visual fields due to limited and sporadic visual scanning.  Pt even had difficulty locating objects that she was holding in her hands; therefore, task was abandoned in favor of more verbal tasks.  During conversations with therapist, pt needed min assist question cues to identify how her current limitations will impact her ability to resume her previous responsibilities at home, including cooking and driving.  Discussed sequela of stroke recovery and realistic prognostic expectations as well as rationale for CIR.  Discussed recommendations for 24/7 supervision at discharge.  Plan is for husband to task ~3-4 weeks per pt, although husband was not present to  verify.  Pt was left in wheelchair with call bell within reach.  Continue per current plan of care.    Function:  Eating Eating                 Cognition Comprehension Comprehension assist level: Follows basic conversation/direction with extra time/assistive device  Expression   Expression assist level: Expresses basic needs/ideas: With extra time/assistive device  Social Interaction Social Interaction assist level: Interacts appropriately 90% of the time - Needs monitoring or encouragement for participation or interaction.  Problem Solving Problem solving assist level: Solves basic 25 - 49% of the time - needs direction more than half the time to initiate, plan or complete simple activities  Memory Memory assist level: Recognizes or recalls 50 - 74% of the time/requires cueing 25 - 49% of the time    Pain Pain Assessment Pain Assessment: No/denies pain   Therapy/Group: Individual Therapy  Taden Witter, Selinda Orion 04/12/2017, 10:35 AM

## 2017-04-13 ENCOUNTER — Inpatient Hospital Stay (HOSPITAL_COMMUNITY): Payer: Commercial Indemnity | Admitting: Speech Pathology

## 2017-04-13 ENCOUNTER — Inpatient Hospital Stay (HOSPITAL_COMMUNITY): Payer: 59

## 2017-04-13 DIAGNOSIS — R509 Fever, unspecified: Secondary | ICD-10-CM | POA: Insufficient documentation

## 2017-04-13 DIAGNOSIS — R6510 Systemic inflammatory response syndrome (SIRS) of non-infectious origin without acute organ dysfunction: Secondary | ICD-10-CM

## 2017-04-13 LAB — CBC
HCT: 31.6 % — ABNORMAL LOW (ref 36.0–46.0)
Hemoglobin: 9.9 g/dL — ABNORMAL LOW (ref 12.0–15.0)
MCH: 25.5 pg — ABNORMAL LOW (ref 26.0–34.0)
MCHC: 31.3 g/dL (ref 30.0–36.0)
MCV: 81.4 fL (ref 78.0–100.0)
Platelets: 327 10*3/uL (ref 150–400)
RBC: 3.88 MIL/uL (ref 3.87–5.11)
RDW: 14.1 % (ref 11.5–15.5)
WBC: 12.6 10*3/uL — ABNORMAL HIGH (ref 4.0–10.5)

## 2017-04-13 LAB — GLUCOSE, CAPILLARY
GLUCOSE-CAPILLARY: 105 mg/dL — AB (ref 65–99)
Glucose-Capillary: 120 mg/dL — ABNORMAL HIGH (ref 65–99)
Glucose-Capillary: 112 mg/dL — ABNORMAL HIGH (ref 65–99)
Glucose-Capillary: 94 mg/dL (ref 65–99)

## 2017-04-13 MED ORDER — VANCOMYCIN HCL 10 G IV SOLR
2000.0000 mg | Freq: Once | INTRAVENOUS | Status: AC
  Administered 2017-04-13: 2000 mg via INTRAVENOUS
  Filled 2017-04-13: qty 2000

## 2017-04-13 MED ORDER — PIPERACILLIN-TAZOBACTAM 3.375 G IVPB
3.3750 g | Freq: Three times a day (TID) | INTRAVENOUS | Status: DC
Start: 2017-04-14 — End: 2017-04-17
  Administered 2017-04-14 – 2017-04-17 (×10): 3.375 g via INTRAVENOUS
  Filled 2017-04-13 (×11): qty 50

## 2017-04-13 MED ORDER — SODIUM CHLORIDE 0.9 % IV SOLN
INTRAVENOUS | Status: DC
  Administered 2017-04-13: 22:00:00 via INTRAVENOUS

## 2017-04-13 MED ORDER — PIPERACILLIN-TAZOBACTAM 3.375 G IVPB 30 MIN
3.3750 g | Freq: Once | INTRAVENOUS | Status: AC
  Administered 2017-04-13: 3.375 g via INTRAVENOUS
  Filled 2017-04-13: qty 50

## 2017-04-13 MED ORDER — VANCOMYCIN HCL IN DEXTROSE 1-5 GM/200ML-% IV SOLN
1000.0000 mg | Freq: Three times a day (TID) | INTRAVENOUS | Status: DC
  Administered 2017-04-14: 1000 mg via INTRAVENOUS
  Filled 2017-04-13 (×2): qty 200

## 2017-04-13 NOTE — Progress Notes (Signed)
Pharmacy Antibiotic Note  Carly [name] is a 46 y.o. female with fever to start empiric antibiotics.  Pharmacy has been consulted for vancomycin/zosyn dosing. -WBC= 12.6, tmax= 102.3, SCr=  0.74 (on 7/26) and CrCrl > 100  Plan: -Zosyn 3.375gm IV q8h -Vancomycin 2000mg  IV followed by 1000mg  IV q8h -Will follow renal function, cultures and clinical progress    Height: 5\' 7"  (170.2 cm) Weight: 211 lb 6.7 oz (95.9 kg) IBW/kg (Calculated) : 61.6  Temp (24hrs), Avg:101.6 F (38.7 C), Min:99.7 F (37.6 C), Max:102.9 F (39.4 C)   Recent Labs Lab 04/10/17 0400 04/13/17 1837  WBC 7.5 12.6*  CREATININE 0.74  --     Estimated Creatinine Clearance: 103.3 mL/min (by C-G formula based on SCr of 0.74 mg/dL).    No Known Allergies  Antimicrobials this admission: 7/29 zosyn 7/29 vanc  Dose adjustments this admission:   Microbiology results: 7/29 blood x2  Thank you for allowing pharmacy to be a part of this patient's care.  Hildred Laser, Pharm D 04/13/2017 9:39 PM

## 2017-04-13 NOTE — Progress Notes (Signed)
Brief Note:  Pt noted to have fever, now with SIRS.  Workup inititated.

## 2017-04-13 NOTE — Progress Notes (Signed)
[place] PHYSICAL MEDICINE & REHABILITATION     PROGRESS NOTE  Subjective/Complaints:  Pt seen laying in bed this AM.  She slept well overnight.  She denies complaints.   ROS: Denies CP, SOB, N/V/D.  Objective: Vital Signs: Blood pressure 129/73, pulse 97, temperature 99.7 F (37.6 C), temperature source Oral, resp. rate 18, height 5\' 7"  (1.702 m), weight 95.9 kg (211 lb 6.7 oz), SpO2 100 %. No results found. No results for input(s): WBC, HGB, HCT, PLT in the last 72 hours. No results for input(s): NA, K, CL, GLUCOSE, BUN, CREATININE, CALCIUM in the last 72 hours.  Invalid input(s): CO CBG (last 3)   Recent Labs  04/12/17 1727 04/12/17 2126 04/13/17 0638  GLUCAP 105* 109* 120*    Wt Readings from Last 3 Encounters:  04/13/17 95.9 kg (211 lb 6.7 oz)  04/09/17 98.2 kg (216 lb 7.9 oz)  02/25/17 109 kg (240 lb 3.2 oz)    Physical Exam:  BP 129/73 (BP Location: Left Arm)   Pulse 97   Temp 99.7 F (37.6 C) (Oral)   Resp 18   Ht 5\' 7"  (1.702 m)   Wt 95.9 kg (211 lb 6.7 oz)   LMP  (LMP Unknown) Comment: pt is unresponsive, and is unable to communicate  SpO2 100%   BMI 33.11 kg/m  Constitutional: She appears well-developed. Obese  HENT: Normocephalic and atraumatic.  Eyes: No discharge. EOMI. Cardiovascular: RRR. No JVD. Respiratory: Effort normal and breath sounds normal.  GI: Soft. Bowel sounds are normal. +PEG  Musculoskeletal: She exhibits no edema or tenderness.  Neurological: She is alert.  Right gaze preference.   Does not make eye contact Soft voice  Oriented to self. Motor: B/l UE 4/5 proximal to distal RLE: 4/5 HF, KE, 4+/5 ADF/PF LLE: 4/5 HF, KE, 4+/5 ADF/PF Skin: Skin is warm and dry. She is not diaphoretic.  Psychiatric: Her affect is blunt. Her speech is delayed. She is slowed. She does not express impulsivity. She is attentive.    Assessment/Plan: 1. Functional deficits secondary to left frontal hemorrhage s/p coiling which require 3+ hours per  day of interdisciplinary therapy in a comprehensive inpatient rehab setting. Physiatrist is providing close team supervision and 24 hour management of active medical problems listed below. Physiatrist and rehab team continue to assess barriers to discharge/monitor patient progress toward functional and medical goals.  Function:  Bathing Bathing position   Position: Shower  Bathing parts Body parts bathed by patient: Right arm, Left arm, Chest, Abdomen, Front perineal area, Buttocks, Right upper leg, Left upper leg, Right lower leg, Left lower leg Body parts bathed by helper: Back  Bathing assist Assist Level: Touching or steadying assistance(Pt > 75%)      Upper Body Dressing/Undressing Upper body dressing   What is the patient wearing?: Pull over shirt/dress     Pull over shirt/dress - Perfomed by patient: Thread/unthread right sleeve, Thread/unthread left sleeve, Put head through opening, Pull shirt over trunk          Upper body assist Assist Level: Supervision or verbal cues      Lower Body Dressing/Undressing Lower body dressing   What is the patient wearing?: Pants, Non-skid slipper socks     Pants- Performed by patient: Thread/unthread right pants leg, Thread/unthread left pants leg, Pull pants up/down   Non-skid slipper socks- Performed by patient: Don/doff right sock Non-skid slipper socks- Performed by helper: Don/doff left sock  Lower body assist        Toileting Toileting          Toileting assist     Transfers Chair/bed transfer   Chair/bed transfer method: Stand pivot Chair/bed transfer assist level: Touching or steadying assistance (Pt > 75%) Chair/bed transfer assistive device: Medical sales representative     Max distance: 55ft Assist level: Touching or steadying assistance (Pt > 75%)   Wheelchair   Type: Manual Max wheelchair distance: 119ft  Assist Level: Touching or steadying assistance (Pt > 75%)   Cognition Comprehension Comprehension assist level: Follows basic conversation/direction with extra time/assistive device  Expression Expression assist level: Expresses basic 90% of the time/requires cueing < 10% of the time.  Social Interaction Social Interaction assist level: Interacts appropriately 90% of the time - Needs monitoring or encouragement for participation or interaction.  Problem Solving Problem solving assist level: Solves basic 50 - 74% of the time/requires cueing 25 - 49% of the time  Memory Memory assist level: Recognizes or recalls 50 - 74% of the time/requires cueing 25 - 49% of the time    Medical Problem List and Plan: 1.  Generalized weakness, aphonia, needs tactile and showing increased ability to follow commands.   secondary to left frontal hemorrhage s/p coiling.  Cont CIR 2.  DVT Prophylaxis/Anticoagulation: Mechanical: Sequential compression devices, below knee Bilateral lower extremities  Per Neurosurg, no chemical prophylaxis 3. H/o migraines/Pain Management: tylenol prn for HA 4. Mood: lacks insight/awareness of deficits. LCSW to follow for evaluation and support when appropriate.  5. Neuropsych: This patient is not capable of making decisions on her own behalf. 6. Skin/Wound Care: routine pressure relief  measures 7. Fluids/Electrolytes/Nutrition: Started calorie count.  PO intake remains variable 8. Prediabetes: Hgb A1c- 5.9. Will continue to monitor BS ac/hs for now  Relatively controlled 7/29 9. ABLA:   Hb 9.5 on 7/26 10. 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  12. Labile blood pressure  Cont to monitor with increased mobility  Controlled 7/29 13. Transaminitis  Cont to monitor  LOS (Days) 4 A FACE TO FACE EVALUATION WAS PERFORMED  Carly [name] Carly [name] 04/13/2017 7:20 AM

## 2017-04-14 ENCOUNTER — Inpatient Hospital Stay (HOSPITAL_COMMUNITY): Payer: 59

## 2017-04-14 ENCOUNTER — Encounter (HOSPITAL_COMMUNITY): Payer: Self-pay

## 2017-04-14 ENCOUNTER — Inpatient Hospital Stay (HOSPITAL_COMMUNITY): Payer: Commercial Indemnity

## 2017-04-14 ENCOUNTER — Inpatient Hospital Stay (HOSPITAL_COMMUNITY): Payer: 59 | Admitting: Speech Pathology

## 2017-04-14 ENCOUNTER — Inpatient Hospital Stay (HOSPITAL_COMMUNITY): Payer: Commercial Indemnity | Admitting: Occupational Therapy

## 2017-04-14 LAB — CBC WITH DIFFERENTIAL/PLATELET
BASOS ABS: 0 10*3/uL (ref 0.0–0.1)
BASOS PCT: 0 %
EOS PCT: 1 %
Eosinophils Absolute: 0.1 10*3/uL (ref 0.0–0.7)
HCT: 30.7 % — ABNORMAL LOW (ref 36.0–46.0)
Hemoglobin: 9.7 g/dL — ABNORMAL LOW (ref 12.0–15.0)
LYMPHS PCT: 13 %
Lymphs Abs: 1.5 10*3/uL (ref 0.7–4.0)
MCH: 26 pg (ref 26.0–34.0)
MCHC: 31.6 g/dL (ref 30.0–36.0)
MCV: 82.3 fL (ref 78.0–100.0)
MONO ABS: 0.5 10*3/uL (ref 0.1–1.0)
Monocytes Relative: 4 %
Neutro Abs: 9.8 10*3/uL — ABNORMAL HIGH (ref 1.7–7.7)
Neutrophils Relative %: 83 %
PLATELETS: 331 10*3/uL (ref 150–400)
RBC: 3.73 MIL/uL — AB (ref 3.87–5.11)
RDW: 14.7 % (ref 11.5–15.5)
WBC: 11.8 10*3/uL — AB (ref 4.0–10.5)

## 2017-04-14 LAB — URINALYSIS, COMPLETE (UACMP) WITH MICROSCOPIC
BILIRUBIN URINE: NEGATIVE
Glucose, UA: NEGATIVE mg/dL
Hgb urine dipstick: NEGATIVE
KETONES UR: NEGATIVE mg/dL
Nitrite: NEGATIVE
PROTEIN: 30 mg/dL — AB
Specific Gravity, Urine: 1.027 (ref 1.005–1.030)
pH: 6 (ref 5.0–8.0)

## 2017-04-14 LAB — COMPREHENSIVE METABOLIC PANEL
ALBUMIN: 3 g/dL — AB (ref 3.5–5.0)
ALT: 47 U/L (ref 14–54)
AST: 47 U/L — AB (ref 15–41)
Alkaline Phosphatase: 182 U/L — ABNORMAL HIGH (ref 38–126)
Anion gap: 7 (ref 5–15)
BUN: 6 mg/dL (ref 6–20)
CHLORIDE: 100 mmol/L — AB (ref 101–111)
CO2: 30 mmol/L (ref 22–32)
CREATININE: 0.97 mg/dL (ref 0.44–1.00)
Calcium: 8.8 mg/dL — ABNORMAL LOW (ref 8.9–10.3)
GFR calc Af Amer: >60 mL/min (ref >60)
GFR calc non Af Amer: >60 mL/min (ref >60)
GLUCOSE: 124 mg/dL — AB (ref 65–99)
POTASSIUM: 3.7 mmol/L (ref 3.5–5.1)
SODIUM: 137 mmol/L (ref 135–145)
Total Bilirubin: 1.1 mg/dL (ref 0.3–1.2)
Total Protein: 6.7 g/dL (ref 6.5–8.1)

## 2017-04-14 LAB — GLUCOSE, CAPILLARY
GLUCOSE-CAPILLARY: 118 mg/dL — AB (ref 65–99)
GLUCOSE-CAPILLARY: 116 mg/dL — AB (ref 65–99)
Glucose-Capillary: 123 mg/dL — ABNORMAL HIGH (ref 65–99)
Glucose-Capillary: 108 mg/dL — ABNORMAL HIGH (ref 65–99)
Glucose-Capillary: 137 mg/dL — ABNORMAL HIGH (ref 65–99)

## 2017-04-14 LAB — LACTIC ACID, PLASMA: LACTIC ACID, VENOUS: 1.4 mmol/L (ref 0.5–1.9)

## 2017-04-14 MED ORDER — PROCHLORPERAZINE 25 MG RE SUPP: 12.5000 mg | Freq: Four times a day (QID) | RECTAL | Status: DC | PRN

## 2017-04-14 MED ORDER — SODIUM CHLORIDE 0.9 % IV BOLUS (SEPSIS)
250.0000 mL | Freq: Once | INTRAVENOUS | Status: AC
  Administered 2017-04-14: 250 mL via INTRAVENOUS

## 2017-04-14 MED ORDER — PROCHLORPERAZINE MALEATE 5 MG PO TABS
5.0000 mg | ORAL_TABLET | Freq: Four times a day (QID) | ORAL | Status: DC | PRN
  Filled 2017-04-14: qty 1

## 2017-04-14 MED ORDER — IOPAMIDOL (ISOVUE-300) INJECTION 61%
INTRAVENOUS | Status: AC
  Administered 2017-04-14: 75 mL
  Filled 2017-04-14: qty 75

## 2017-04-14 MED ORDER — VANCOMYCIN HCL IN DEXTROSE 1-5 GM/200ML-% IV SOLN
1000.0000 mg | Freq: Three times a day (TID) | INTRAVENOUS | Status: DC
  Administered 2017-04-14 – 2017-04-15 (×3): 1000 mg via INTRAVENOUS
  Filled 2017-04-14 (×5): qty 200

## 2017-04-14 MED ORDER — GUAIFENESIN-DM 100-10 MG/5ML PO SYRP: 5.0000 mL | ORAL_SOLUTION | Freq: Four times a day (QID) | ORAL | Status: DC | PRN

## 2017-04-14 MED ORDER — PROCHLORPERAZINE EDISYLATE 5 MG/ML IJ SOLN
5.0000 mg | Freq: Four times a day (QID) | INTRAMUSCULAR | Status: DC | PRN
  Filled 2017-04-14: qty 2

## 2017-04-14 MED ORDER — SODIUM CHLORIDE 0.9 % IV SOLN
INTRAVENOUS | Status: DC
  Administered 2017-04-14 – 2017-04-15 (×2): via INTRAVENOUS

## 2017-04-14 MED ORDER — TRAZODONE HCL 50 MG PO TABS: 25.0000 mg | ORAL_TABLET | Freq: Every evening | ORAL | Status: DC | PRN

## 2017-04-14 MED ORDER — ACETAMINOPHEN 325 MG PO TABS
650.0000 mg | ORAL_TABLET | ORAL | Status: DC | PRN
  Administered 2017-04-14 – 2017-04-21 (×10): 650 mg via ORAL
  Filled 2017-04-14 (×10): qty 2

## 2017-04-14 MED ORDER — DIPHENHYDRAMINE HCL 12.5 MG/5ML PO ELIX: 12.5000 mg | ORAL_SOLUTION | Freq: Four times a day (QID) | ORAL | Status: DC | PRN

## 2017-04-14 NOTE — Progress Notes (Signed)
Speech Language Pathology Daily Session Note  Patient Details  Name: Carly [name] MRN: [id number] Date of Birth: 04/11/1971  Today's Date: 04/14/2017 SLP Individual Time: 1000-1100 SLP Individual Time Calculation (min): 60 min  Short Term Goals: Week 1: SLP Short Term Goal 1 (Week 1): Pt will utilize external memory aids to recall new daily information with Min A cues.  SLP Short Term Goal 2 (Week 1): Pt will recall orientation information with Min A cues.  SLP Short Term Goal 3 (Week 1): Pt will initiate basic familiar tasks within 5 seconds in 75% of opportunites with Mod A cues.  SLP Short Term Goal 4 (Week 1): Pt will complete basic, familiar tasks within ADLs activities with Mod A cues.  SLP Short Term Goal 5 (Week 1): Pt will utilize increased vocal intensity with Mod A cues to achieve ~ 75% intelligilbity at the sentenc level. SLP Short Term Goal 6 (Week 1): Pt will locate objects in all fields of vision in 50% of opportunties with Mod A cues.   Skilled Therapeutic Interventions: Skilled treatment session focused on cognition goals. SLP facilitated session by providing Total A for auditory information such as orientation information and pt unable to use external compensatory strategies d/t significant visual deficits. Pt stated that she felt very tired and she required more support to engage in session. Pt felt it was d/t previous therapy sessions. Pt left upright in bed, bed alarm on and all needs within reach. Continue per current plan of care.      Function:    Cognition Comprehension Comprehension assist level: Understands basic 90% of the time/cues < 10% of the time  Expression   Expression assist level: Expresses basic 90% of the time/requires cueing < 10% of the time.  Social Interaction Social Interaction assist level: Interacts appropriately 90% of the time - Needs monitoring or encouragement for participation or interaction.  Problem Solving Problem solving assist  level: Solves basic 50 - 74% of the time/requires cueing 25 - 49% of the time  Memory Memory assist level: Recognizes or recalls 50 - 74% of the time/requires cueing 25 - 49% of the time    Pain    Therapy/Group: Individual Therapy  Kiannah Grunow 04/14/2017, 2:41 PM

## 2017-04-14 NOTE — Progress Notes (Signed)
04/14/17 1420  Notified Dr Posey Pronto [phone] of patients temp 102.2 oral. Waiting response.

## 2017-04-14 NOTE — Progress Notes (Signed)
    CC:  Fever with leaking around the PEG tube  Subjective: Ask to see for leaking PEG and fever.  So far fever work up is negative.  I looked at the PEG site.  She has a 24 Fr. Tube in place.  There is some leakage around the tube.  Does not look infected.  Getting labs updated now.    Objective: Vital signs in last 24 hours: Temp:  [99.9 F (37.7 C)-102.8 F (39.3 C)] 102.2 F (39 C) (07/30 1417) Pulse Rate:  [101-103] 103 (07/30 1417) Resp:  [18] 18 (07/30 1417) BP: (124-126)/(72-75) 126/72 (07/30 1417) SpO2:  [99 %-100 %] 99 % (07/30 1417) Weight:  [98.9 kg (218 lb 0.6 oz)] 98.9 kg (218 lb 0.6 oz) (07/30 0431) Last BM Date: 04/14/17  Intake/Output from previous day: 07/29 0701 - 07/30 0700 In: 370 [P.O.:120; I.V.:200; IV Piggyback:50] Out: -  Intake/Output this shift: Total I/O In: 340 [P.O.:340] Out: -   General appearance: alert, cooperative and no distress GI: soft, tender around the site.  Some leakage of gastric content around the PEG.  Lab Results:   Recent Labs  04/13/17 1837  WBC 12.6*  HGB 9.9*  HCT 31.6*  PLT 327    BMET No results for input(s): NA, K, CL, CO2, GLUCOSE, BUN, CREATININE, CALCIUM in the last 72 hours. PT/INR No results for input(s): LABPROT, INR in the last 72 hours.   Recent Labs Lab 04/10/17 0400  AST 48*  ALT 67*  ALKPHOS 173*  BILITOT 0.7  PROT 6.5  ALBUMIN 3.1*     Lipase  No results found for: LIPASE   Medications: . chlorhexidine  15 mL Mouth Rinse BID  . feeding supplement (PRO-STAT SUGAR FREE 64)  30 mL Per Tube Daily  . insulin aspart  0-5 Units Subcutaneous QHS  . insulin aspart  0-9 Units Subcutaneous TID WC  . levETIRAcetam  1,000 mg Oral BID  . metoCLOPramide  5 mg Oral Daily  . multivitamin with minerals  1 tablet Oral Daily  . pantoprazole  40 mg Oral Daily  . sodium chloride flush  10-40 mL Intracatheter Q12H    Assessment/Plan Pt with PEG placement 04/01/16 for dysphagia - now with fever and  leaking around the tube. 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 FEN:  Regular diet ID:  Zosyn 7/29 =>> day 2 DVT:  SCD  Plan:  Will discuss with Dr. Hulen Skains. She is taking thing PO now, but it's to early to pull tube.    LOS: 5 days    Calee Nugent 04/14/2017 [phone]

## 2017-04-14 NOTE — Progress Notes (Signed)
[place] PHYSICAL MEDICINE & REHABILITATION     PROGRESS NOTE  Subjective/Complaints:  Pt seen sitting up in her chair this AM working with therapies.  She states she had fevers overnight, but is feeling better this AM.    ROS: Denies CP, SOB, N/V/D.  Objective: Vital Signs: Blood pressure 124/75, pulse (!) 101, temperature 99.9 F (37.7 C), temperature source Oral, resp. rate 18, height 5\' 7"  (1.702 m), weight 98.9 kg (218 lb 0.6 oz), SpO2 100 %. Dg Chest Port 1 View  Result Date: 04/13/2017 CLINICAL DATA:  Fever. EXAM: PORTABLE CHEST 1 VIEW COMPARISON:  March 23, 2017 FINDINGS: The heart size and mediastinal contours are within normal limits. Both lungs are clear. The visualized skeletal structures are unremarkable. IMPRESSION: No active disease. Electronically Signed   By: Dorise Bullion III M.D   On: 04/13/2017 20:11    Recent Labs  04/13/17 1837  WBC 12.6*  HGB 9.9*  HCT 31.6*  PLT 327   No results for input(s): NA, K, CL, GLUCOSE, BUN, CREATININE, CALCIUM in the last 72 hours.  Invalid input(s): CO CBG (last 3)   Recent Labs  04/13/17 2108 04/14/17 0615 04/14/17 0631  GLUCAP 94 123* 118*    Wt Readings from Last 3 Encounters:  04/14/17 98.9 kg (218 lb 0.6 oz)  04/09/17 98.2 kg (216 lb 7.9 oz)  02/25/17 109 kg (240 lb 3.2 oz)    Physical Exam:  BP 124/75 (BP Location: Right Arm)   Pulse (!) 101   Temp 99.9 F (37.7 C) (Oral)   Resp 18   Ht 5\' 7"  (1.702 m)   Wt 98.9 kg (218 lb 0.6 oz)   LMP  (LMP Unknown) Comment: pt is unresponsive, and is unable to communicate  SpO2 100%   BMI 34.15 kg/m  Constitutional: She appears well-developed. Obese  HENT: Normocephalic and atraumatic.  Eyes: No discharge. EOMI. Cardiovascular: RRR. No JVD. Respiratory: Effort normal and breath sounds normal.  GI: Soft. Bowel sounds are normal. +PEG c/d/i. Musculoskeletal: She exhibits no edema or tenderness.  Neurological: She is alert.  Right gaze preference.   Does not  make eye contact Soft voice  Oriented to self. Motor: B/l UE 4/5 proximal to distal RLE: 4/5 HF, KE, 4+/5 ADF/PF (unchanged) LLE: 4/5 HF, KE, 4+/5 ADF/PF Skin: Skin is warm and dry. She is not diaphoretic.  Psychiatric: Her affect is blunt. Her speech is delayed. She is slowed. She does not express impulsivity. She is attentive.    Assessment/Plan: 1. Functional deficits secondary to left frontal hemorrhage s/p coiling which require 3+ hours per day of interdisciplinary therapy in a comprehensive inpatient rehab setting. Physiatrist is providing close team supervision and 24 hour management of active medical problems listed below. Physiatrist and rehab team continue to assess barriers to discharge/monitor patient progress toward functional and medical goals.  Function:  Bathing Bathing position   Position: Shower  Bathing parts Body parts bathed by patient: Right arm, Left arm, Chest, Abdomen, Front perineal area, Buttocks, Right upper leg, Left upper leg, Right lower leg, Left lower leg Body parts bathed by helper: Back  Bathing assist Assist Level: Touching or steadying assistance(Pt > 75%)      Upper Body Dressing/Undressing Upper body dressing   What is the patient wearing?: Pull over shirt/dress     Pull over shirt/dress - Perfomed by patient: Thread/unthread right sleeve, Thread/unthread left sleeve, Put head through opening, Pull shirt over trunk  Upper body assist Assist Level: Supervision or verbal cues      Lower Body Dressing/Undressing Lower body dressing   What is the patient wearing?: Pants, Non-skid slipper socks     Pants- Performed by patient: Thread/unthread right pants leg, Thread/unthread left pants leg, Pull pants up/down   Non-skid slipper socks- Performed by patient: Don/doff right sock Non-skid slipper socks- Performed by helper: Don/doff left sock                  Lower body assist        Toileting Toileting           Toileting assist     Transfers Chair/bed transfer   Chair/bed transfer method: Stand pivot Chair/bed transfer assist level: Touching or steadying assistance (Pt > 75%) Chair/bed transfer assistive device: Medical sales representative     Max distance: 83ft Assist level: Touching or steadying assistance (Pt > 75%)   Wheelchair   Type: Manual Max wheelchair distance: 165ft  Assist Level: Touching or steadying assistance (Pt > 75%)  Cognition Comprehension Comprehension assist level: Follows basic conversation/direction with extra time/assistive device  Expression Expression assist level: Expresses basic 90% of the time/requires cueing < 10% of the time.  Social Interaction Social Interaction assist level: Interacts appropriately 90% of the time - Needs monitoring or encouragement for participation or interaction.  Problem Solving Problem solving assist level: Solves basic 50 - 74% of the time/requires cueing 25 - 49% of the time  Memory Memory assist level: Recognizes or recalls 50 - 74% of the time/requires cueing 25 - 49% of the time    Medical Problem List and Plan: 1.  Generalized weakness, aphonia, needs tactile and showing increased ability to follow commands.   secondary to left frontal hemorrhage s/p coiling.  Cont CIR 2.  DVT Prophylaxis/Anticoagulation: Mechanical: Sequential compression devices, below knee Bilateral lower extremities  Per Neurosurg, no chemical prophylaxis 3. H/o migraines/Pain Management: tylenol prn for HA 4. Mood: lacks insight/awareness of deficits. LCSW to follow for evaluation and support when appropriate.  5. Neuropsych: This patient is not capable of making decisions on her own behalf. 6. Skin/Wound Care: routine pressure relief  measures 7. Fluids/Electrolytes/Nutrition: Started calorie count.  PO intake remains variable 8. Prediabetes: Hgb A1c- 5.9. Will continue to monitor BS ac/hs for now  Relatively controlled 7/30 9. ABLA:   Hb  9.9 on 7/29 10. 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  12. Labile blood pressure  Cont to monitor with increased mobility  Controlled 7/30 13. Transaminitis  Cont to monitor 14. SIRS  WBCs 12.6 on 7/29  CXR reviewed, unremarkable  UA equivocal, Ucx pending  Blood cx pending  Cont Vanc/Zosyn  Cont IVF  LOS (Days) 5 A FACE TO FACE EVALUATION WAS PERFORMED  Sumedh Shinsato Lorie Phenix 04/14/2017 8:19 AM

## 2017-04-14 NOTE — Progress Notes (Signed)
04/14/17  1720 Applied SCD's

## 2017-04-14 NOTE — Progress Notes (Signed)
Occupational Therapy Session Note  Patient Details  Name: Carly [name] MRN: [id number] Date of Birth: June 09, 1971  Today's Date: 04/14/2017 OT Individual Time: 0100-7121 OT Individual Time Calculation (min): 65 min    Short Term Goals: Week 1:  OT Short Term Goal 1 (Week 1): Pt will perform LB bathing sit to stand with min assist.  OT Short Term Goal 2 (Week 1): Pt will perform LB dressing sit to stand with min assist.   OT Short Term Goal 3 (Week 1): Pt will complete toilet transfers with min assist RW to 3:1. OT Short Term Goal 4 (Week 1): Pt completed walk-in shower transfers with the RW and min assist. OT Short Term Goal 5 (Week 1): Pt will locate 3-4 grooming items on top of sink with no more than min instructional cueing.    Skilled Therapeutic Interventions/Progress Updates:    Pt received supine in bed, agreeable to OT tx session. Pt reporting pulsing pain at IV site in LUE with RN made aware. Focus of session on ADL/self care retraining. Pt completed bathing and dressing ADLs w/c level at sink as Pt was receiving IV antibiotics. Pt required increased time for completing supine to sitting EOB due to fatigue, completing with overall modA. Pt completed stand pivot transfer using RW EOB>w/c with minA, required mod verbal cues during transfer for positioning self in front of w/c. Pt completes bathing with assist for washing buttocks, back, and feet and minA to steady in standing. Prior to dressing Pt with urgency to have BM, completes stand pivot transfer w/c<>BSC with MinA, and completes perihygiene with MinA to steady in standing.  Pt completed UB dressing with supervision, MaxA for LB dressing and totalA for footwear due to time constraints. Pt left seated in w/c, needs within reach, RN present to administer meds.   Therapy Documentation Precautions:  Precautions Precautions: Fall Precaution Comments: PEG ,  impaired central vision Restrictions Weight Bearing Restrictions:  No   Pain: Pain Assessment Pain Assessment: 0-10 Pain Score: 7  Pain Type: Acute pain Pain Location: Arm Pain Orientation: Left Pain Descriptors / Indicators: Aching;Sore Pain Onset: On-going Pain Intervention(s): RN made aware;Repositioned;Rest  See Function Navigator for Current Functional Status.   Therapy/Group: Individual Therapy  Raymondo Band 04/14/2017, 3:36 PM

## 2017-04-14 NOTE — Progress Notes (Signed)
04/14/17  Left message on voicemail for Dr Posey Pronto [phone] in regards to patient family wanting to know when the patients feeding tube would be removed. Waiting for response.

## 2017-04-14 NOTE — Plan of Care (Signed)
Problem: RH SAFETY Goal: RH STG ADHERE TO SAFETY PRECAUTIONS W/ASSISTANCE/DEVICE STG Adhere to Safety Precautions With cues/reminders Assistance/Device.   Outcome: Progressing No safety issues

## 2017-04-14 NOTE — Progress Notes (Signed)
Carly [name] had a CT Abdomen with Contrast tonight:  IMPRESSION: Dislodged gastrostomy tube, now positioned in the subcutaneous tissues of the anterior abdominal wall.  Surrounding inflammatory changes. No drainable fluid collection/abscess.  Given malposition, cessation of feeds via tube is suggested until replacement can be performed.   Received a call from Dr. Mardelle Matte stating [name] PEG Tube was dislodged,  He recommended not to use the  Peg Tube. This Probation officer informed him PEG Tube is not being used. Left message for Dr. Posey Pronto regarding the above and spoke to the RN as well.

## 2017-04-14 NOTE — Progress Notes (Signed)
Physical Therapy Session Note  Patient Details  Name: Carly [name] MRN: [id number] Date of Birth: 1971/01/07  Today's Date: 04/14/2017 PT Individual Time: 1300-1415 PT Individual Time Calculation (min): 75 min   Short Term Goals: Week 1:  PT Short Term Goal 1 (Week 1): Pt will ambulated 50 ft with min assist and LRAD PT Short Term Goal 2 (Week 1): Pt will propel w/c 150 ft with supervision  PT Short Term Goal 3 (Week 1): Pt will maintain dynamic standing balance during functional activitiy with min A and without UE support  Skilled Therapeutic Interventions/Progress Updates:    Pt supine in bed upon PT arrival, agreeable to therapy tx. Pt transferred from supine to sitting EOB with min assist to help bring trunk forward. Pt transferred from bed to bed side commode via ambulation x83ft into the bathroom using the RW with min assist. Pt working on dynamic standing balance to doff pants and briefs, clean peri area and don pants/briefs after. Pt ambulated x 20 ft using the RW and min assist to the sink in order to wash hands. Pt reports feeling dizzy when standing. Pt's blood pressure monitored in sitting 112/62 and again in standing to check for orthostatic hypotension, in standing BP was 107/63 with c/o minimal dizziness this time. Pt reported not feeling well this afternoon and feeling fatigue. Agreeable to seated exercise in chair. Pt used kinetron x 10 min for LE strengthening, activity tolerance and endurance. Pt transferred from w/c back to bed with RW and min assist. Pt transferred from sitting EOB to supine with min assist. In supine pt able to scoot up in bed pushing through LEs with min assist and verbal cues for technique. Pt left supine in bed with call bell in reach and husband present.   Therapy Documentation Precautions:  Precautions Precautions: Fall Precaution Comments: PEG ,  impaired central vision Restrictions Weight Bearing Restrictions: No Pain: pt reports 7/10 pain around  stomach area, RN notified.   See Function Navigator for Current Functional Status.   Therapy/Group: Individual Therapy  Netta Corrigan, PT, DPT 04/14/2017, 1:17 PM

## 2017-04-15 ENCOUNTER — Encounter (HOSPITAL_COMMUNITY): Payer: Self-pay | Admitting: Interventional Radiology

## 2017-04-15 ENCOUNTER — Inpatient Hospital Stay (HOSPITAL_COMMUNITY): Payer: Commercial Indemnity | Admitting: Physical Therapy

## 2017-04-15 ENCOUNTER — Inpatient Hospital Stay (HOSPITAL_COMMUNITY): Payer: Commercial Indemnity

## 2017-04-15 ENCOUNTER — Inpatient Hospital Stay (HOSPITAL_COMMUNITY): Payer: 59

## 2017-04-15 ENCOUNTER — Inpatient Hospital Stay (HOSPITAL_COMMUNITY): Payer: Commercial Indemnity | Admitting: Occupational Therapy

## 2017-04-15 DIAGNOSIS — E876 Hypokalemia: Secondary | ICD-10-CM

## 2017-04-15 DIAGNOSIS — A419 Sepsis, unspecified organism: Secondary | ICD-10-CM

## 2017-04-15 HISTORY — PX: IR REPLC GASTRO/COLONIC TUBE PERCUT W/FLUORO: IMG2333

## 2017-04-15 LAB — GLUCOSE, CAPILLARY
GLUCOSE-CAPILLARY: 114 mg/dL — AB (ref 65–99)
Glucose-Capillary: 121 mg/dL — ABNORMAL HIGH (ref 65–99)
Glucose-Capillary: 91 mg/dL (ref 65–99)
Glucose-Capillary: 116 mg/dL — ABNORMAL HIGH (ref 65–99)

## 2017-04-15 LAB — BASIC METABOLIC PANEL
ANION GAP: 9 (ref 5–15)
BUN: <5 mg/dL — ABNORMAL LOW (ref 6–20)
CHLORIDE: 106 mmol/L (ref 101–111)
CO2: 27 mmol/L (ref 22–32)
Calcium: 8.5 mg/dL — ABNORMAL LOW (ref 8.9–10.3)
Creatinine, Ser: 1.04 mg/dL — ABNORMAL HIGH (ref 0.44–1.00)
GFR calc Af Amer: >60 mL/min (ref >60)
GFR calc non Af Amer: >60 mL/min (ref >60)
Glucose, Bld: 134 mg/dL — ABNORMAL HIGH (ref 65–99)
POTASSIUM: 3.4 mmol/L — AB (ref 3.5–5.1)
SODIUM: 142 mmol/L (ref 135–145)

## 2017-04-15 LAB — VANCOMYCIN, TROUGH: VANCOMYCIN TR: 25 ug/mL — AB (ref 15–20)

## 2017-04-15 MED ORDER — VANCOMYCIN HCL 10 G IV SOLR
1500.0000 mg | Freq: Two times a day (BID) | INTRAVENOUS | Status: DC
  Filled 2017-04-15 (×2): qty 1500

## 2017-04-15 MED ORDER — PRO-STAT SUGAR FREE PO LIQD
30.0000 mL | Freq: Every day | ORAL | Status: DC
  Administered 2017-04-17 – 2017-04-22 (×6): 30 mL via ORAL
  Filled 2017-04-15 (×8): qty 30

## 2017-04-15 MED ORDER — VANCOMYCIN HCL 10 G IV SOLR
1250.0000 mg | Freq: Two times a day (BID) | INTRAVENOUS | Status: DC
  Administered 2017-04-15 – 2017-04-17 (×3): 1250 mg via INTRAVENOUS
  Filled 2017-04-15 (×4): qty 1250

## 2017-04-15 MED ORDER — POTASSIUM CHLORIDE CRYS ER 20 MEQ PO TBCR
40.0000 meq | EXTENDED_RELEASE_TABLET | Freq: Once | ORAL | Status: AC
  Administered 2017-04-15: 40 meq via ORAL
  Filled 2017-04-15: qty 2

## 2017-04-15 MED ORDER — HYDROMORPHONE HCL 1 MG/ML IJ SOLN
INTRAMUSCULAR | Status: AC
  Filled 2017-04-15: qty 1

## 2017-04-15 MED ORDER — HYDROMORPHONE HCL 1 MG/ML IJ SOLN
1.0000 mg | Freq: Once | INTRAMUSCULAR | Status: AC
  Administered 2017-04-15: 1 mg via INTRAVENOUS

## 2017-04-15 MED ORDER — LIDOCAINE VISCOUS 2 % MT SOLN
OROMUCOSAL | Status: AC
  Filled 2017-04-15: qty 15

## 2017-04-15 MED ORDER — IOPAMIDOL (ISOVUE-300) INJECTION 61%
INTRAVENOUS | Status: AC
  Administered 2017-04-15: 35 mL
  Filled 2017-04-15: qty 50

## 2017-04-15 MED ORDER — LIDOCAINE HCL (PF) 1 % IJ SOLN
INTRAMUSCULAR | Status: AC
  Filled 2017-04-15: qty 30

## 2017-04-15 NOTE — Progress Notes (Signed)
Occupational Therapy Session Note  Patient Details  Name: Carly [name] MRN: [id number] Date of Birth: 21-May-1971  Today's Date: 04/15/2017 OT Individual Time: 0800-0901 OT Individual Time Calculation (min): 61 min    Short Term Goals: Week 1:  OT Short Term Goal 1 (Week 1): Pt will perform LB bathing sit to stand with min assist.  OT Short Term Goal 2 (Week 1): Pt will perform LB dressing sit to stand with min assist.   OT Short Term Goal 3 (Week 1): Pt will complete toilet transfers with min assist RW to 3:1. OT Short Term Goal 4 (Week 1): Pt completed walk-in shower transfers with the RW and min assist. OT Short Term Goal 5 (Week 1): Pt will locate 3-4 grooming items on top of sink with no more than min instructional cueing.    Skilled Therapeutic Interventions/Progress Updates:    Pt in bed at start of session.  She was able to transition to the EOB with min assist in preparation for selfcare tasks.  Min assist for stand pivot transfer to the wheelchair for bathing and dressing at the sink, secondary to having running IV currently.  She was able to complete most bathing with min guard assist to supervision overall.  Min assist for dressing sit to stand secondary to not being able to thread her foot in the left pants leg.  She stood with close supervision for oral hygiene, but needed min instructional cueing for compensatory techniques secondary to not being able to put the toothpaste on the toothbrush.  Finished session with pt in wheelchair working on breakfast after setup of tray.  Call button and phone in reach.    Therapy Documentation Precautions:  Precautions Precautions: Fall Precaution Comments: PEG ,  impaired central vision Restrictions Weight Bearing Restrictions: No  Pain: Pain Assessment Pain Assessment: No/denies pain ADL: See Function Navigator for Current Functional Status.   Therapy/Group: Individual Therapy  Enoch Moffa OTR/L 04/15/2017, 9:06 AM

## 2017-04-15 NOTE — Progress Notes (Signed)
Occupational Therapy Note  Patient Details  Name: MILEAH HEMMER MRN: [id number] Date of Birth: [birth year]  Therapist attempted to make up missed tx time. Pt still off unit for procedure. Cont POC upon pt return.    Lewis, Samiyah Stupka C 04/15/2017, 1:46 PM

## 2017-04-15 NOTE — Progress Notes (Signed)
Pharmacy Antibiotic Note  Carly [name] is a 46 y.o. female with fever to start empiric antibiotics.  Pharmacy has been consulted for vancomycin/zosyn dosing. -WBC= 11.8, tmax= 102.2, SCr trending up to 1.0  Vancomycin trough is above goal tonight at 25, drawn slightly early but still would likely be above 20. Will change to 1250 mg q12 which should result in a trough closer to 16.  Plan: Continue Zosyn 3.375gm IV q8h for now Change vancomycin to 1250mg  IV q12 hours - delay start a couple hours -Will follow renal function, cultures and clinical progress    Height: 5\' 7"  (170.2 cm) Weight: 206 lb 2.1 oz (93.5 kg) IBW/kg (Calculated) : 61.6  Temp (24hrs), Avg:98.8 F (37.1 C), Min:98.5 F (36.9 C), Max:99.1 F (37.3 C)   Recent Labs Lab 04/10/17 0400 04/13/17 1837 04/14/17 1504 04/15/17 0440 04/15/17 1856  WBC 7.5 12.6* 11.8*  --   --   CREATININE 0.74  --  0.97 1.04*  --   LATICACIDVEN  --   --  1.4  --   --   VANCOTROUGH  --   --   --   --  25*    Estimated Creatinine Clearance: 78.5 mL/min (A) (by C-G formula based on SCr of 1.04 mg/dL (H)).    No Known Allergies  Antimicrobials this admission: 7/29 zosyn 7/29 vanc  Dose adjustments this admission: 7/31 Decrease vancomycin to 1250mg  IV q12 hours  Microbiology results: 7/29 blood x2>>ngtd  Thank you for allowing pharmacy to be a part of this patient's care.  Erin Hearing PharmD., BCPS Clinical Pharmacist Pager [phone] 04/15/2017 8:25 PM

## 2017-04-15 NOTE — Progress Notes (Signed)
Notified by lab of Critical lab value 25- Vanco Tr result called to on call Zella Ball) and Pharmacist Pilar Plate)

## 2017-04-15 NOTE — Progress Notes (Signed)
[facility]     PROGRESS NOTE  Subjective/Complaints:  Pt seen laying in bed this AM.  She slept well overnight.  She feels better this AM.  Yesterday, tube noted to be dislodged.   ROS: Denies CP, SOB, N/V/D.  Objective: Vital Signs: Blood pressure 133/69, pulse 91, temperature 99.1 F (37.3 C), temperature source Oral, resp. rate 18, height 5\' 7"  (1.702 m), weight 93.5 kg (206 lb 2.1 oz), SpO2 99 %. Ct Abdomen W Contrast  Addendum Date: 04/14/2017   ADDENDUM REPORT: 04/14/2017 21:07 ADDENDUM: These results were called by telephone at the time of interpretation on 04/14/2017 at 9:05 pm to Danella Sensing, NP, who verbally acknowledged these results. Electronically Signed   By: Julian Hy M.D.   On: 04/14/2017 21:07   Result Date: 04/14/2017 CLINICAL DATA:  Drainage from PEG tube site, pain, low-grade fever EXAM: CT ABDOMEN WITH CONTRAST TECHNIQUE: Multidetector CT imaging of the abdomen was performed using the standard protocol following bolus administration of intravenous contrast. CONTRAST:  68mL ISOVUE-300 IOPAMIDOL (ISOVUE-300) INJECTION 61% COMPARISON:  None. FINDINGS: Lower chest: Lung bases are clear. Hepatobiliary: Liver is within normal limits. Gallbladder is unremarkable. No intrahepatic extrahepatic ductal dilatation. Pancreas: Within normal limits. Spleen: Within normal limits. Adrenals/Urinary Tract: Adrenal glands are within normal limits. Kidneys are within normal limits.  No hydronephrosis. Stomach/Bowel: Gastrostomy tube has become dislodged from the gastric antrum and now resides within the subcutaneous tissues of the anterior abdominal wall (series 3/ image 35). A patent track is present between the gastrostomy tube and the distal gastric antrum (series 3/ images 33-35). Visualized bowel is otherwise unremarkable. Vascular/Lymphatic: No evidence of abdominal aortic aneurysm. No suspicious abdominal lymphadenopathy. Other: No abdominal ascites.  Mild subcutaneous stranding along the anterior abdominal wall. No drainable fluid collection/abscess. Musculoskeletal: Visualized osseous structures are within normal limits. IMPRESSION: Dislodged gastrostomy tube, now positioned in the subcutaneous tissues of the anterior abdominal wall. Surrounding inflammatory changes. No drainable fluid collection/abscess. Given malposition, cessation of feeds via tube is suggested until replacement can be performed. Electronically Signed: By: Julian Hy M.D. On: 04/14/2017 20:58   Dg Chest Port 1 View  Result Date: 04/13/2017 CLINICAL DATA:  Fever. EXAM: PORTABLE CHEST 1 VIEW COMPARISON:  March 23, 2017 FINDINGS: The heart size and mediastinal contours are within normal limits. Both lungs are clear. The visualized skeletal structures are unremarkable. IMPRESSION: No active disease. Electronically Signed   By: Dorise Bullion III M.D   On: 04/13/2017 20:11    Recent Labs  04/13/17 1837 04/14/17 1504  WBC 12.6* 11.8*  HGB 9.9* 9.7*  HCT 31.6* 30.7*  PLT 327 331    Recent Labs  04/14/17 1504 04/15/17 0440  NA 137 142  K 3.7 3.4*  CL 100* 106  GLUCOSE 124* 134*  BUN 6 <5*  CREATININE 0.97 1.04*  CALCIUM 8.8* 8.5*   CBG (last 3)   Recent Labs  04/14/17 1639 04/14/17 2132 04/15/17 0627  GLUCAP 108* 137* 114*    Wt Readings from Last 3 Encounters:  04/15/17 93.5 kg (206 lb 2.1 oz)  04/09/17 98.2 kg (216 lb 7.9 oz)  02/25/17 109 kg (240 lb 3.2 oz)    Physical Exam:  BP 133/69 (BP Location: Right Arm)   Pulse 91   Temp 99.1 F (37.3 C) (Oral)   Resp 18   Ht 5\' 7"  (1.702 m)   Wt 93.5 kg (206 lb 2.1 oz)   LMP  (LMP Unknown) Comment: pt is unresponsive,  and is unable to communicate  SpO2 99%   BMI 32.28 kg/m  Constitutional: She appears well-developed. Obese  HENT: Normocephalic and atraumatic.  Eyes: No discharge. EOMI. Cardiovascular: RRR. No JVD. Respiratory: Effortnormal and breath sounds normal.  GI: Soft. Bowel sounds  are normal. +PEG c/d/i, mildly tender abdomen.   Musculoskeletal: She exhibits no edema or tenderness.  Neurological: She is alert and orientation improving.  Right gaze preference.   Does not make eye contact Motor: B/l UE 4/5 proximal to distal RLE: 4/5 HF, KE, 4+/5 ADF/PF (stable) LLE: 4/5 HF, KE, 4+/5 ADF/PF Skin: Skin is warm and dry. She is not diaphoretic.  Psychiatric: Her affect is blunt. Her speech is delayed. She is slowed. She does not express impulsivity. She is attentive.    Assessment/Plan: 1. Functional deficits secondary to left frontal hemorrhage s/p coiling which require 3+ hours per day of interdisciplinary therapy in a comprehensive inpatient rehab setting. Physiatrist is providing close team supervision and 24 hour management of active medical problems listed below. Physiatrist and rehab team continue to assess barriers to discharge/monitor patient progress toward functional and medical goals.  Function:  Bathing Bathing position   Position: Wheelchair/chair at sink  Bathing parts Body parts bathed by patient: Right arm, Left arm, Chest, Abdomen, Front perineal area, Right upper leg, Left upper leg, Right lower leg, Left lower leg Body parts bathed by helper: Back, Buttocks  Bathing assist Assist Level: Touching or steadying assistance(Pt > 75%)      Upper Body Dressing/Undressing Upper body dressing   What is the patient wearing?: Pull over shirt/dress     Pull over shirt/dress - Perfomed by patient: Thread/unthread right sleeve, Thread/unthread left sleeve, Put head through opening, Pull shirt over trunk          Upper body assist Assist Level: Supervision or verbal cues      Lower Body Dressing/Undressing Lower body dressing   What is the patient wearing?: Pants, Non-skid slipper socks     Pants- Performed by patient: Pull pants up/down Pants- Performed by helper: Thread/unthread right pants leg, Thread/unthread left pants leg Non-skid slipper  socks- Performed by patient: Don/doff right sock Non-skid slipper socks- Performed by helper: Don/doff right sock, Don/doff left sock                  Lower body assist Assist for lower body dressing:  (MaxA)      Toileting Toileting   Toileting steps completed by patient: Adjust clothing prior to toileting, Performs perineal hygiene Toileting steps completed by helper: Adjust clothing after toileting    Toileting assist     Transfers Chair/bed transfer   Chair/bed transfer method: Stand pivot Chair/bed transfer assist level: Touching or steadying assistance (Pt > 75%) Chair/bed transfer assistive device: Walker, Air cabin crew     Max distance: 20 ft Assist level: Touching or steadying assistance (Pt > 75%)   Wheelchair   Type: Manual Max wheelchair distance: 175ft  Assist Level: Touching or steadying assistance (Pt > 75%)  Cognition Comprehension Comprehension assist level: Understands basic 90% of the time/cues < 10% of the time  Expression Expression assist level: Expresses basic 90% of the time/requires cueing < 10% of the time.  Social Interaction Social Interaction assist level: Interacts appropriately 90% of the time - Needs monitoring or encouragement for participation or interaction.  Problem Solving Problem solving assist level: Solves basic 50 - 74% of the time/requires cueing 25 - 49% of the time  Memory Memory assist level: Recognizes or recalls 50 - 74% of the time/requires cueing 25 - 49% of the time    Medical Problem List and Plan: 1.  Generalized weakness, aphonia, needs tactile and showing increased ability to follow commands.   secondary to left frontal hemorrhage s/p coiling.  Cont CIR 2.  DVT Prophylaxis/Anticoagulation: Mechanical: Sequential compression devices, below knee Bilateral lower extremities  Per Neurosurg, no chemical prophylaxis 3. H/o migraines/Pain Management: tylenol prn for HA 4. Mood: lacks  insight/awareness of deficits. LCSW to follow for evaluation and support when appropriate.  5. Neuropsych: This patient is not capable of making decisions on her own behalf. 6. Skin/Wound Care: routine pressure relief  measures 7. Fluids/Electrolytes/Nutrition: Started calorie count.  PO intake remains variable, slightly improving 8. Prediabetes: Hgb A1c- 5.9. Will continue to monitor BS ac/hs for now  Relatively controlled 7/31 9. ABLA:   Hb 9.7 on 7/30 10. 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  12. Labile blood pressure  Cont to monitor with increased mobility  Controlled 7/31 13. Transaminitis  Cont to monitor, improving 14. Sepsis  WBCs 11.8 on 7/30  CXR reviewed, unremarkable  UA equivocal, Ucx remains pending  Blood cx pending 7/31  Cont Vanc/Zosyn  Cont IVF  Abd CT reviewed, showing dislodged PEG and inflammation  Will change PEG 14. Hypokalemia  K+ 3.4 on 7/31  Will supplement x1  LOS (Days) 6 A FACE TO FACE EVALUATION WAS PERFORMED  Ankit Lorie Phenix 04/15/2017 7:58 AM

## 2017-04-15 NOTE — Progress Notes (Signed)
[facility] Progress Note     Subjective: CC:  Abdominal tenderness to palpation. Tolerating PO. Having BMs. No nausea/vomiting.  Febrile. TMAX 102.2 WBC 11.8 Objective: Vital signs in last 24 hours: Temp:  [99.1 F (37.3 C)-102.2 F (39 C)] 99.1 F (37.3 C) (07/31 0500) Pulse Rate:  [91-103] 91 (07/31 0500) Resp:  [18] 18 (07/31 0500) BP: (126-134)/(69-72) 133/69 (07/31 0500) SpO2:  [99 %-100 %] 99 % (07/31 0500) Weight:  [93.5 kg (206 lb 2.1 oz)] 93.5 kg (206 lb 2.1 oz) (07/31 0500) Last BM Date: 04/14/17  Intake/Output from previous day: 07/30 0701 - 07/31 0700 In: 3800 [P.O.:800; I.V.:1800; IV Piggyback:1200] Out: -  Intake/Output this shift: No intake/output data recorded.  PE: Gen:  Alert, NAD, pleasant Card:  Regular rate and rhythm, pedal pulses 2+ BL  Pulm:  Normal effort Abd: Soft, TTP anterior abdominal wall around gastrostomy tube, mild erythema <1 cm, palpation elicits minimal, foul-smelling light brown purulence,  non-distended Skin: warm and dry, no rashes  Psych: A&Ox3 Neuro: delayed auditory processing, appropriate speech, following commands   Lab Results:   Recent Labs  04/13/17 1837 04/14/17 1504  WBC 12.6* 11.8*  HGB 9.9* 9.7*  HCT 31.6* 30.7*  PLT 327 331   BMET  Recent Labs  04/14/17 1504 04/15/17 0440  NA 137 142  K 3.7 3.4*  CL 100* 106  CO2 30 27  GLUCOSE 124* 134*  BUN 6 <5*  CREATININE 0.97 1.04*  CALCIUM 8.8* 8.5*   PT/INR No results for input(s): LABPROT, INR in the last 72 hours. CMP     Component Value Date/Time   NA 142 04/15/2017 0440   NA 143 02/25/2017 1352   K 3.4 (L) 04/15/2017 0440   CL 106 04/15/2017 0440   CO2 27 04/15/2017 0440   GLUCOSE 134 (H) 04/15/2017 0440   BUN <5 (L) 04/15/2017 0440   BUN 9 02/25/2017 1352   CREATININE 1.04 (H) 04/15/2017 0440   CALCIUM 8.5 (L) 04/15/2017 0440   PROT 6.7 04/14/2017 1504   PROT 6.7 02/25/2017 1352   ALBUMIN 3.0 (L) 04/14/2017 1504   ALBUMIN  4.1 02/25/2017 1352   AST 47 (H) 04/14/2017 1504   ALT 47 04/14/2017 1504   ALKPHOS 182 (H) 04/14/2017 1504   BILITOT 1.1 04/14/2017 1504   BILITOT 0.2 02/25/2017 1352   GFRNONAA >60 04/15/2017 0440   GFRAA >60 04/15/2017 0440   Lipase  No results found for: LIPASE     Studies/Results: Ct Abdomen W Contrast  Addendum Date: 04/14/2017   ADDENDUM REPORT: 04/14/2017 21:07 ADDENDUM: These results were called by telephone at the time of interpretation on 04/14/2017 at 9:05 pm to Danella Sensing, NP, who verbally acknowledged these results. Electronically Signed   By: Julian Hy M.D.   On: 04/14/2017 21:07   Result Date: 04/14/2017 CLINICAL DATA:  Drainage from PEG tube site, pain, low-grade fever EXAM: CT ABDOMEN WITH CONTRAST TECHNIQUE: Multidetector CT imaging of the abdomen was performed using the standard protocol following bolus administration of intravenous contrast. CONTRAST:  37mL ISOVUE-300 IOPAMIDOL (ISOVUE-300) INJECTION 61% COMPARISON:  None. FINDINGS: Lower chest: Lung bases are clear. Hepatobiliary: Liver is within normal limits. Gallbladder is unremarkable. No intrahepatic extrahepatic ductal dilatation. Pancreas: Within normal limits. Spleen: Within normal limits. Adrenals/Urinary Tract: Adrenal glands are within normal limits. Kidneys are within normal limits.  No hydronephrosis. Stomach/Bowel: Gastrostomy tube has become dislodged from the gastric antrum and now resides within the subcutaneous tissues of the anterior abdominal wall (series  3/ image 35). A patent track is present between the gastrostomy tube and the distal gastric antrum (series 3/ images 33-35). Visualized bowel is otherwise unremarkable. Vascular/Lymphatic: No evidence of abdominal aortic aneurysm. No suspicious abdominal lymphadenopathy. Other: No abdominal ascites. Mild subcutaneous stranding along the anterior abdominal wall. No drainable fluid collection/abscess. Musculoskeletal: Visualized osseous  structures are within normal limits. IMPRESSION: Dislodged gastrostomy tube, now positioned in the subcutaneous tissues of the anterior abdominal wall. Surrounding inflammatory changes. No drainable fluid collection/abscess. Given malposition, cessation of feeds via tube is suggested until replacement can be performed. Electronically Signed: By: Julian Hy M.D. On: 04/14/2017 20:58   Dg Chest Port 1 View  Result Date: 04/13/2017 CLINICAL DATA:  Fever. EXAM: PORTABLE CHEST 1 VIEW COMPARISON:  March 23, 2017 FINDINGS: The heart size and mediastinal contours are within normal limits. Both lungs are clear. The visualized skeletal structures are unremarkable. IMPRESSION: No active disease. Electronically Signed   By: Dorise Bullion III M.D   On: 04/13/2017 20:11    Anti-infectives: Anti-infectives    Start     Dose/Rate Route Frequency Ordered Stop   04/14/17 2000  vancomycin (VANCOCIN) IVPB 1000 mg/200 mL premix     1,000 mg 200 mL/hr over 60 Minutes Intravenous Every 8 hours 04/14/17 1237     04/14/17 0700  vancomycin (VANCOCIN) IVPB 1000 mg/200 mL premix  Status:  Discontinued     1,000 mg 200 mL/hr over 60 Minutes Intravenous Every 8 hours 04/13/17 2145 04/14/17 1237   04/14/17 0430  piperacillin-tazobactam (ZOSYN) IVPB 3.375 g     3.375 g 12.5 mL/hr over 240 Minutes Intravenous Every 8 hours 04/13/17 2145     04/13/17 2230  vancomycin (VANCOCIN) 2,000 mg in sodium chloride 0.9 % 500 mL IVPB     2,000 mg 250 mL/hr over 120 Minutes Intravenous  Once 04/13/17 2145 04/14/17 0045   04/13/17 2200  piperacillin-tazobactam (ZOSYN) IVPB 3.375 g     3.375 g 100 mL/hr over 30 Minutes Intravenous  Once 04/13/17 2145 04/14/17 0044     Assessment/Plan Pt with PEG placement 04/01/16 for dysphagia  - pt only received TF per PEG for 2 days, now tolerating PO intake  - now with fever and mild drainage around the tube. - Dr. Hulen Skains able to tighten PEG at bedside 7/30, follow-up CT shows tube in  abdominal wall, not in stomach, w/ patent tract between tube and distal gastric antrum.  - recommend IR consult for possible gastrostomy tube exchange.   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 FEN:  Regular diet >>NPO  ID:  Zosyn 7/29>> DVT:  SCD  Plan: IR consult, we will follow    LOS: 6 days    Jill Alexanders , [facility] 04/15/2017, 10:59 AM Pager: [phone] Consults: [phone] Mon-Fri 7:00 am-4:30 pm Sat-Sun 7:00 am-11:30 am

## 2017-04-15 NOTE — Progress Notes (Signed)
Physical Therapy Session Note  Patient Details  Name: Carly [name] MRN: [id number] Date of Birth: March 29, 1971  Today's Date: 04/15/2017 PT Individual Time: 0 min   Short Term Goals: Week 1:  PT Short Term Goal 1 (Week 1): Pt will ambulated 50 ft with min assist and LRAD PT Short Term Goal 2 (Week 1): Pt will propel w/c 150 ft with supervision  PT Short Term Goal 3 (Week 1): Pt will maintain dynamic standing balance during functional activitiy with min A and without UE support  Skilled Therapeutic Interventions/Progress Updates:    Pt missed 30 minutes this session secondary to going for radiology procedure, IR for possible gastrostomy tube exchange.  Therapy Documentation Precautions:  Precautions Precautions: Fall Precaution Comments: PEG ,  impaired central vision Restrictions Weight Bearing Restrictions: No  General: PT Amount of Missed Time (min): 30 Minutes PT Missed Treatment Reason: Other (Comment) (Radiology)  Pain: Abdominal pain 7/10   See Function Navigator for Current Functional Status.   Therapy/Group: Individual Therapy  Netta Corrigan, PT, DPT 04/15/2017, 1:13 PM

## 2017-04-15 NOTE — Progress Notes (Signed)
Speech Language Pathology Daily Session Note  Patient Details  Name: Carly [name] MRN: [id number] Date of Birth: 07/13/1971  Today's Date: 04/15/2017 SLP Individual Time: 1050-1130 SLP Individual Time Calculation (min): 40 min  Short Term Goals: Week 1: SLP Short Term Goal 1 (Week 1): Pt will utilize external memory aids to recall new daily information with Min A cues.  SLP Short Term Goal 2 (Week 1): Pt will recall orientation information with Min A cues.  SLP Short Term Goal 3 (Week 1): Pt will initiate basic familiar tasks within 5 seconds in 75% of opportunites with Mod A cues.  SLP Short Term Goal 4 (Week 1): Pt will complete basic, familiar tasks within ADLs activities with Mod A cues.  SLP Short Term Goal 5 (Week 1): Pt will utilize increased vocal intensity with Mod A cues to achieve ~ 75% intelligilbity at the sentenc level. SLP Short Term Goal 6 (Week 1): Pt will locate objects in all fields of vision in 50% of opportunties with Mod A cues.  Week 2:    Skilled Therapeutic Interventions: Skilled St services focused on cognitive goals. 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 SLP facilitated speech intelligibility increase intensity during cognitive tasks, however minimum change noted with given strategies. SLP facilitated recall of new information pertaining today's events and information given to patient about medical changes requiring Min A verbal cues given minimal time delay. SLP facilitated task initiation utilizing simple 1 and 2 step body movement directions and transferring into bed, Pt completed directions with 70% accuracy and with Mod-Min delay in initiation. Pt left in room in bed with family members and call bell within reach. Recommend to continue ST services.      Function:   Cognition Comprehension Comprehension assist level: Understands basic 90% of the  time/cues < 10% of the time  Expression   Expression assist level: Expresses basic 90% of the time/requires cueing < 10% of the time.  Social Interaction Social Interaction assist level: Interacts appropriately 90% of the time - Needs monitoring or encouragement for participation or interaction.  Problem Solving Problem solving assist level: Solves basic 50 - 74% of the time/requires cueing 25 - 49% of the time  Memory Memory assist level: Recognizes or recalls 75 - 89% of the time/requires cueing 10 - 24% of the time    Pain Pain Assessment Pain Assessment: No/denies pain  Therapy/Group: Individual Therapy  Eugenia Eldredge  [facility] 04/15/2017, 12:52 PM

## 2017-04-15 NOTE — Progress Notes (Signed)
Physical Therapy Session Note  Patient Details  Name: Carly [name] MRN: [id number] Date of Birth: 02-15-1971  Today's Date: 04/15/2017 PT Individual Time: 7218-2883 PT Individual Time Calculation (min): 57 min   Short Term Goals: Week 1:  PT Short Term Goal 1 (Week 1): Pt will ambulated 50 ft with min assist and LRAD PT Short Term Goal 2 (Week 1): Pt will propel w/c 150 ft with supervision  PT Short Term Goal 3 (Week 1): Pt will maintain dynamic standing balance during functional activitiy with min A and without UE support  Skilled Therapeutic Interventions/Progress Updates:   Pt in w/c upon arrival and agreeable to therapy, no c/o pain. Noted shakiness in extremities, pt reports she is cold, resolves w/ warmth from mobility or blanket.   Worked on [employer] in seated and standing w/ dynamic balance performed RUE tasks w/o UE support and touching/steady assist from PT. Verbal and tactile cues for RUE placement secondary to impaired vision.    Assisted pt w/c to/from toilet, Mod A performing stand secondary to low toilet surface, total A for pericare. BM as documented in flowsheet. Verbal and tactile cues for UE use of rails in bathroom.   NuStep to facilitate reciprocal movement pattern and increase LE functional strength - @ L2, 15 min w/o rest breaks, mild increase in work of breathing.  Ended session in w/c in c/o family, call bell within reach and all needs met.   Therapy Documentation Precautions:  Precautions Precautions: Fall Precaution Comments: PEG ,  impaired central vision Restrictions Weight Bearing Restrictions: No Pain: Pain Assessment Pain Assessment: No/denies pain  See Function Navigator for Current Functional Status.   Therapy/Group: Individual Therapy  Yesly Gerety K Arnette 04/15/2017, 10:01 AM

## 2017-04-16 ENCOUNTER — Inpatient Hospital Stay (HOSPITAL_COMMUNITY): Payer: Commercial Indemnity | Admitting: Speech Pathology

## 2017-04-16 ENCOUNTER — Inpatient Hospital Stay (HOSPITAL_COMMUNITY): Payer: 59 | Admitting: Occupational Therapy

## 2017-04-16 ENCOUNTER — Inpatient Hospital Stay (HOSPITAL_COMMUNITY): Payer: Commercial Indemnity

## 2017-04-16 DIAGNOSIS — T85528A Displacement of other gastrointestinal prosthetic devices, implants and grafts, initial encounter: Secondary | ICD-10-CM

## 2017-04-16 DIAGNOSIS — Z431 Encounter for attention to gastrostomy: Secondary | ICD-10-CM

## 2017-04-16 LAB — CBC
HCT: 30.7 % — ABNORMAL LOW (ref 36.0–46.0)
Hemoglobin: 9.4 g/dL — ABNORMAL LOW (ref 12.0–15.0)
MCH: 25.1 pg — AB (ref 26.0–34.0)
MCHC: 30.6 g/dL (ref 30.0–36.0)
MCV: 81.9 fL (ref 78.0–100.0)
PLATELETS: 356 10*3/uL (ref 150–400)
RBC: 3.75 MIL/uL — AB (ref 3.87–5.11)
RDW: 14.2 % (ref 11.5–15.5)
WBC: 9 10*3/uL (ref 4.0–10.5)

## 2017-04-16 LAB — URINE CULTURE: Culture: <10000 — AB

## 2017-04-16 LAB — GLUCOSE, CAPILLARY
Glucose-Capillary: 112 mg/dL — ABNORMAL HIGH (ref 65–99)
Glucose-Capillary: 113 mg/dL — ABNORMAL HIGH (ref 65–99)
Glucose-Capillary: 110 mg/dL — ABNORMAL HIGH (ref 65–99)
Glucose-Capillary: 167 mg/dL — ABNORMAL HIGH (ref 65–99)

## 2017-04-16 NOTE — Progress Notes (Signed)
Speech Language Pathology Daily Session Note  Patient Details  Name: Carly [name] MRN: [id number] Date of Birth: 17-Jul-1971  Today's Date: 04/16/2017 SLP Individual Time: 6948-5462 SLP Individual Time Calculation (min): 60 min  Short Term Goals: Week 1: SLP Short Term Goal 1 (Week 1): Pt will utilize external memory aids to recall new daily information with Min A cues.  SLP Short Term Goal 2 (Week 1): Pt will recall orientation information with Min A cues.  SLP Short Term Goal 3 (Week 1): Pt will initiate basic familiar tasks within 5 seconds in 75% of opportunites with Mod A cues.  SLP Short Term Goal 4 (Week 1): Pt will complete basic, familiar tasks within ADLs activities with Mod A cues.  SLP Short Term Goal 5 (Week 1): Pt will utilize increased vocal intensity with Mod A cues to achieve ~ 75% intelligilbity at the sentenc level. SLP Short Term Goal 6 (Week 1): Pt will locate objects in all fields of vision in 50% of opportunties with Mod A cues.   Skilled Therapeutic Interventions:  Pt was seen for skilled ST targeting cognitive goals.  SLP facilitated the session with a medication management task to address memory of daily information.  Pt was unaware of function of medications when named initially; however, after initial instruction pt was able to immediately recall function of 3 out of 4 medications, improving to 4 out of 4 recall with min assist question cues.  SLP facilitated the session with ongoing diagnostic treatment of visual scanning with pt only able to intermittently bring her eyes to midline.  When locating moving objects pt appeared to primarily rely on her peripheral vision for scanning.  Pt was returned to room and left in wheelchair with call bell within reach.  Continue per current plan of care.    Function:  Eating Eating                 Cognition Comprehension Comprehension assist level: Understands basic 90% of the time/cues < 10% of the time  Expression    Expression assist level: Expresses basic 90% of the time/requires cueing < 10% of the time.  Social Interaction Social Interaction assist level: Interacts appropriately 75 - 89% of the time - Needs redirection for appropriate language or to initiate interaction.  Problem Solving Problem solving assist level: Solves basic 50 - 74% of the time/requires cueing 25 - 49% of the time  Memory Memory assist level: Recognizes or recalls 50 - 74% of the time/requires cueing 25 - 49% of the time    Pain Pain Assessment Pain Assessment: No/denies pain  Therapy/Group: Individual Therapy  Carly [name], Selinda Orion 04/16/2017, 12:29 PM

## 2017-04-16 NOTE — Progress Notes (Signed)
Physical Therapy Session Note  Patient Details  Name: Carly [name] MRN: [id number] Date of Birth: 06-12-1971  Today's Date: 04/16/2017 PT Individual Time: 1115-1200 PT Individual Time Calculation (min): 45 min   Short Term Goals: Week 1:  PT Short Term Goal 1 (Week 1): Pt will ambulated 50 ft with min assist and LRAD PT Short Term Goal 2 (Week 1): Pt will propel w/c 150 ft with supervision  PT Short Term Goal 3 (Week 1): Pt will maintain dynamic standing balance during functional activitiy with min A and without UE support  Skilled Therapeutic Interventions/Progress Updates:    Pt supine in bed upon PT arrival, agreeable to therapy tx. Pt transferred from supine to sitting EOB with min assist. Pt transferred from bed to w/c stand step using RW with min assist. Pt ambulated x 55 ft using a RW with min assist for balance, rest break after due to fatigue. Pt ambulated x 55 ft using RW and min assist back to her chair. Pt performed sit to stand x 2 with supervision and verbal cues for technique. Pt standing performed toe taps on 6 inch step each foot x 6 using RW for UE support and x 5 each foot without UE support working on dynamic standing balance. Pt stood on foam pad 2 x 30 sec without UE support, increased ankle strategy and posterior sway noted. Pt transferred from w/c back to bed via ambulation with RW and min assist. Pt transferred from sitting EOB to supine with min assist. Pt left supine in bed with needs in reach.   Therapy Documentation Precautions:  Precautions Precautions: Fall Precaution Comments: PEG ,  impaired central vision Restrictions Weight Bearing Restrictions: No Pain: Pt reports pain 5/10 around her abdomen incision, RN aware.   See Function Navigator for Current Functional Status.   Therapy/Group: Individual Therapy  Netta Corrigan, PT, DPT 04/16/2017, 12:06 PM

## 2017-04-16 NOTE — Progress Notes (Signed)
[facility] PHYSICAL MEDICINE & REHABILITATION     PROGRESS NOTE  Subjective/Complaints:  Pt seen laying in bed this AM.  She states she did not sleep well overnight because her IV became dislodged.  Overall, she feels better.   ROS: Denies CP, SOB, N/V/D.  Objective: Vital Signs: Blood pressure (!) 150/78, pulse 78, temperature 99 F (37.2 C), temperature source Oral, resp. rate 18, height 5\' 7"  (1.702 m), weight 93.6 kg (206 lb 5.2 oz), SpO2 100 %. Ct Abdomen W Contrast  Addendum Date: 04/14/2017   ADDENDUM REPORT: 04/14/2017 21:07 ADDENDUM: These results were called by telephone at the time of interpretation on 04/14/2017 at 9:05 pm to Danella Sensing, NP, who verbally acknowledged these results. Electronically Signed   By: Julian Hy M.D.   On: 04/14/2017 21:07   Result Date: 04/14/2017 CLINICAL DATA:  Drainage from PEG tube site, pain, low-grade fever EXAM: CT ABDOMEN WITH CONTRAST TECHNIQUE: Multidetector CT imaging of the abdomen was performed using the standard protocol following bolus administration of intravenous contrast. CONTRAST:  38mL ISOVUE-300 IOPAMIDOL (ISOVUE-300) INJECTION 61% COMPARISON:  None. FINDINGS: Lower chest: Lung bases are clear. Hepatobiliary: Liver is within normal limits. Gallbladder is unremarkable. No intrahepatic extrahepatic ductal dilatation. Pancreas: Within normal limits. Spleen: Within normal limits. Adrenals/Urinary Tract: Adrenal glands are within normal limits. Kidneys are within normal limits.  No hydronephrosis. Stomach/Bowel: Gastrostomy tube has become dislodged from the gastric antrum and now resides within the subcutaneous tissues of the anterior abdominal wall (series 3/ image 35). A patent track is present between the gastrostomy tube and the distal gastric antrum (series 3/ images 33-35). Visualized bowel is otherwise unremarkable. Vascular/Lymphatic: No evidence of abdominal aortic aneurysm. No suspicious abdominal lymphadenopathy. Other: No  abdominal ascites. Mild subcutaneous stranding along the anterior abdominal wall. No drainable fluid collection/abscess. Musculoskeletal: Visualized osseous structures are within normal limits. IMPRESSION: Dislodged gastrostomy tube, now positioned in the subcutaneous tissues of the anterior abdominal wall. Surrounding inflammatory changes. No drainable fluid collection/abscess. Given malposition, cessation of feeds via tube is suggested until replacement can be performed. Electronically Signed: By: Julian Hy M.D. On: 04/14/2017 20:58   Ir Replc Gastro/colonic Tube Percut W/fluoro  Result Date: 04/15/2017 INDICATION: 46 year old female with an endoscopically placed percutaneous gastrostomy tube which has become dislodged through the anterior wall of the stomach and into the subcutaneous fat. She presents for attempted tube rescue and replacement. EXAM: GASTROSTOMY CATHETER REPLACEMENT MEDICATIONS: None ANESTHESIA/SEDATION: 1 mg Dilaudid was administered intravenously. CONTRAST:  20 mL Isovue 370 - administered into the gastric lumen. FLUOROSCOPY TIME:  Fluoroscopy Time: 3 minutes 30 seconds (41 mGy). COMPLICATIONS: None immediate. PROCEDURE: Informed written consent was obtained from the patient after a thorough discussion of the procedural risks, benefits and alternatives. All questions were addressed. Maximal Sterile Barrier Technique was utilized including caps, mask, sterile gowns, sterile gloves, sterile drape, hand hygiene and skin antiseptic. A timeout was performed prior to the initiation of the procedure. The existing percutaneous gastrostomy tube was injected with contrast material in a steep LAO oblique configuration. There is a persistent but small patent tract through the subcutaneous fat and into the stomach. Maintaining this angulation, a glide wire and 5 French catheter were navigated through the tract and into the stomach. A gentle hand injection of contrast material through the catheter  confirmed its intragastric location. The glidewire was then coiled within the gastric lumen. The existing percutaneous gastrostomy tube was removed over the wire. The soft tissue tract was then dilated to 20 [place]  and a 87 French balloon retention gastrostomy tube was advanced over the wire and into the stomach. The retention balloon was inflated with 10 mL of sterile saline and pulled gently against the anterior abdominal wall. The external bumper was fixed in place. A repeat contrast injection confirms the tube to be intragastric in location. IMPRESSION: Successful salvage of percutaneous gastrostomy tube. A new 24 French balloon retention tube was successfully placed in the gastric lumen. Electronically Signed   By: Jacqulynn Cadet M.D.   On: 04/15/2017 15:12    Recent Labs  04/14/17 1504 04/16/17 0534  WBC 11.8* 9.0  HGB 9.7* 9.4*  HCT 30.7* 30.7*  PLT 331 356    Recent Labs  04/14/17 1504 04/15/17 0440  NA 137 142  K 3.7 3.4*  CL 100* 106  GLUCOSE 124* 134*  BUN 6 <5*  CREATININE 0.97 1.04*  CALCIUM 8.8* 8.5*   CBG (last 3)   Recent Labs  04/15/17 1630 04/15/17 2131 04/16/17 0615  GLUCAP 91 116* 112*    Wt Readings from Last 3 Encounters:  04/16/17 93.6 kg (206 lb 5.2 oz)  04/09/17 98.2 kg (216 lb 7.9 oz)  02/25/17 109 kg (240 lb 3.2 oz)    Physical Exam:  BP (!) 150/78 (BP Location: Left Arm)   Pulse 78   Temp 99 F (37.2 C) (Oral)   Resp 18   Ht 5\' 7"  (1.702 m)   Wt 93.6 kg (206 lb 5.2 oz)   LMP  (Within Months)   SpO2 100%   BMI 32.32 kg/m  Constitutional: She appears well-developed. Obese  HENT: Normocephalic and atraumatic.  Eyes: No discharge. EOMI. Cardiovascular: RRR. No JVD. Respiratory: Effortnormal and breath sounds normal.  GI: Soft. Bowel sounds are normal. +PEG c/d/i, mildly tender abdomen, improving.   Musculoskeletal: She exhibits no edema or tenderness.  Neurological: She is alert and orientation improving.  Right gaze preference.    Does not make eye contact Motor: B/l UE 4/5 proximal to distal RLE: 4/5 HF, KE, 4+/5 ADF/PF (unchanged) LLE: 4/5 HF, KE, 4+/5 ADF/PF Skin: Skin is warm and dry. She is not diaphoretic.  Psychiatric: Her affect is blunt. Her speech is delayed. She is slowed. She does not express impulsivity. She is attentive.    Assessment/Plan: 1. Functional deficits secondary to left frontal hemorrhage s/p coiling which require 3+ hours per day of interdisciplinary therapy in a comprehensive inpatient rehab setting. Physiatrist is providing close team supervision and 24 hour management of active medical problems listed below. Physiatrist and rehab team continue to assess barriers to discharge/monitor patient progress toward functional and medical goals.  Function:  Bathing Bathing position   Position: Wheelchair/chair at sink  Bathing parts Body parts bathed by patient: Right arm, Left arm, Chest, Abdomen, Front perineal area, Right upper leg, Left upper leg Body parts bathed by helper: Buttocks  Bathing assist Assist Level: Touching or steadying assistance(Pt > 75%)      Upper Body Dressing/Undressing Upper body dressing   What is the patient wearing?: Pull over shirt/dress     Pull over shirt/dress - Perfomed by patient: Thread/unthread right sleeve, Thread/unthread left sleeve, Put head through opening, Pull shirt over trunk          Upper body assist Assist Level: Supervision or verbal cues      Lower Body Dressing/Undressing Lower body dressing   What is the patient wearing?: Pants     Pants- Performed by patient: Thread/unthread right pants leg, Pull pants up/down Pants-  Performed by helper: Thread/unthread left pants leg Non-skid slipper socks- Performed by patient: Don/doff right sock Non-skid slipper socks- Performed by helper: Don/doff right sock, Don/doff left sock                  Lower body assist Assist for lower body dressing:  (MaxA)      Toileting Toileting    Toileting steps completed by patient: Adjust clothing prior to toileting, Performs perineal hygiene Toileting steps completed by helper: Adjust clothing after toileting    Toileting assist     Transfers Chair/bed transfer   Chair/bed transfer method: Stand pivot Chair/bed transfer assist level: Touching or steadying assistance (Pt > 75%) Chair/bed transfer assistive device: Armrests, Medical sales representative     Max distance: 10' Assist level: Touching or steadying assistance (Pt > 75%)   Wheelchair   Type: Manual Max wheelchair distance: 146ft  Assist Level: Touching or steadying assistance (Pt > 75%)  Cognition Comprehension Comprehension assist level: Understands basic 90% of the time/cues < 10% of the time  Expression Expression assist level: Expresses basic 90% of the time/requires cueing < 10% of the time.  Social Interaction Social Interaction assist level: Interacts appropriately 90% of the time - Needs monitoring or encouragement for participation or interaction.  Problem Solving Problem solving assist level: Solves basic 50 - 74% of the time/requires cueing 25 - 49% of the time  Memory Memory assist level: Recognizes or recalls 50 - 74% of the time/requires cueing 25 - 49% of the time    Medical Problem List and Plan: 1.  Generalized weakness, aphonia, needs tactile and showing increased ability to follow commands.   secondary to left frontal hemorrhage s/p coiling.  Cont CIR 2.  DVT Prophylaxis/Anticoagulation: Mechanical: Sequential compression devices, below knee Bilateral lower extremities  Per Neurosurg, no chemical prophylaxis 3. H/o migraines/Pain Management: tylenol prn for HA 4. Mood: lacks insight/awareness of deficits. LCSW to follow for evaluation and support when appropriate.  5. Neuropsych: This patient is not capable of making decisions on her own behalf. 6. Skin/Wound Care: routine pressure relief  measures 7. Fluids/Electrolytes/Nutrition:  Started calorie count.  PO intake remains variable, slightly improving 8. Prediabetes: Hgb A1c- 5.9. Will continue to monitor BS ac/hs for now  Relatively controlled 8/1 9. ABLA:   Hb 9.7 on 7/30 10. 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  12. Labile blood pressure  Cont to monitor with increased mobility  Persistent 7/31 13. Transaminitis  Cont to monitor, improving 14. Sepsis  WBCs 9.4 on 8/1  CXR reviewed, unremarkable  UA equivocal, Ucx insig growth  Blood cx pending 8/1  Cont Vanc/Zosyn -Vanc level elevated  Cont IVF  Abd CT reviewed, showing dislodged PEG and inflammation  Will change PEG 14. Hypokalemia  K+ 3.4 on 7/31  Will supplement x1  LOS (Days) 7 A FACE TO FACE EVALUATION WAS PERFORMED  Mirza Fessel Lorie Phenix 04/16/2017 8:10 AM

## 2017-04-16 NOTE — Progress Notes (Signed)
Occupational Therapy Session Note  Patient Details  Name: Carly [name] MRN: [id number] Date of Birth: 08-04-1971  Today's Date: 04/16/2017 OT Individual Time: 1330-1355 OT Individual Time Calculation (min): 25 min    Short Term Goals: Week 2:  OT Short Term Goal 1 (Week 2): Continue working on established LTGs set at supervision level overall.  Skilled Therapeutic Interventions/Progress Updates:    Treatment session with focus on visual compensation during oral care.  Pt received upright in w/c reporting desire to brush teeth.  Encouraged pt to locate items with pt demonstrating difficulty locating toothbrush while stating that it should be in cup - but it was not.  Pt initially grasped vaseline tube instead of toothpaste.  Educated on having a consistent location for items to ensure carryover of locating them and to always return items to their correct place as well as bright colored toothpaste tube to set it apart from other grooming/self-care items.  Discussed use of toothpaste gel vs paste to increase contrast when applying to toothbrush.  Pt appreciative of all suggestions and reports understanding.  Provided pt with half lap tray for RUE to assist with elbow extension as pt IV continuously occluded due to elbow flexion.    Therapy Documentation Precautions:  Precautions Precautions: Fall Precaution Comments: PEG ,  impaired central vision Restrictions Weight Bearing Restrictions: No General:   Vital Signs: Therapy Vitals Temp: 99.4 F (37.4 C) Temp Source: Oral Pulse Rate: 92 BP: (!) 148/65 Patient Position (if appropriate): Lying Pain:  Pt with no c/o pain  See Function Navigator for Current Functional Status.   Therapy/Group: Individual Therapy  Simonne Come 04/16/2017, 3:15 PM

## 2017-04-16 NOTE — Progress Notes (Signed)
Occupational Therapy Weekly Progress Note  Patient Details  Name: Carly [name] MRN: [id number] Date of Birth: [birth year]  Beginning of progress report period: April 10, 2017 End of progress report period: April 16, 2017  Today's Date: 04/16/2017 OT Individual Time: 0800-0901 OT Individual Time Calculation (min): 61 min    Patient has met 4 of 5 short term goals.  Carly [name] is making steady progress with OT at this time and currently completes most selfcare tasks sit to stand with min to min guard assist.  She completes functional transfers with min assist using the RW for toilet and walk-in shower transfers.  She continues to have difficulty with visual acuity and tends to rely on her peripheral vision to help her locate items.  Min to mod assist for grooming items on the sink unless they have a good contrast with the background.  Carly [name] continues to exhibit decreased memory as well, not recalling place or time.  Decreased carryover of techniques day to day or events that happened such as if she took a bath or shower the day before.  Overall her progress has been good but she still needs comprehensive inpatient OT to progress to supervision level for discharge home with her spouse.  Anticipate 24 hour supervision for safety secondary to visual deficits as well as cognitive deficits.    Patient continues to demonstrate the following deficits: muscle weakness, decreased visual acuity, decreased problem solving, decreased memory and delayed processing and decreased standing balance and decreased balance strategies and therefore will continue to benefit from skilled OT intervention to enhance overall performance with BADL and iADL.  Patient progressing toward long term goals..  Continue plan of care.  OT Short Term Goals Week 2:  OT Short Term Goal 1 (Week 2): Continue working on established LTGs set at supervision level overall.  Skilled Therapeutic Interventions/Progress Updates:    Carly [name]  transferred from supine to sit EOB with supervision.  She was then able to transfer over from the bed to the toilet with min assist using the RW for support.  She completed all toileting with min guard assist sit to stand.  Once finished she ambulated out to the sink with the RW and min guard assist for bathing.  She was able to complete all aspects of bathing with min guard assist.  Mod assist for setup and location of items during bathing secondary to decreased vision.  She was able to complete all dressing with min guard assist as well sit to stand.  She completed grooming with setup from wheelchair to finish session.  Carly [name] left at bedside in wheelchair with call button in lap and phone in reach.    Therapy Documentation Precautions:  Precautions Precautions: Fall Precaution Comments: PEG ,  impaired central vision Restrictions Weight Bearing Restrictions: No  Pain: Pain Assessment Pain Assessment: Faces Faces Pain Scale: Hurts a little bit Pain Type: Acute pain Pain Location: Abdomen Pain Orientation: Mid Pain Descriptors / Indicators: Aching Pain Onset: With Activity Pain Intervention(s): Repositioned;Emotional support ADL: See Function Navigator for Current Functional Status.   Therapy/Group: Individual Therapy  Nikan Ellingson OTR/L 04/16/2017, 9:24 AM

## 2017-04-16 NOTE — Progress Notes (Signed)
IV leaking and dislodged upon assessing site, notified  IV Team for restart, pressure dressing applied to IV site Lf. AC no sign of infection, swelling or infiltration.

## 2017-04-17 ENCOUNTER — Inpatient Hospital Stay (HOSPITAL_COMMUNITY): Payer: 59 | Admitting: Occupational Therapy

## 2017-04-17 ENCOUNTER — Inpatient Hospital Stay (HOSPITAL_COMMUNITY): Payer: 59 | Admitting: Physical Therapy

## 2017-04-17 ENCOUNTER — Inpatient Hospital Stay (HOSPITAL_COMMUNITY): Payer: 59 | Admitting: Speech Pathology

## 2017-04-17 DIAGNOSIS — R638 Other symptoms and signs concerning food and fluid intake: Secondary | ICD-10-CM

## 2017-04-17 DIAGNOSIS — I720 Aneurysm of carotid artery: Secondary | ICD-10-CM

## 2017-04-17 DIAGNOSIS — I69319 Unspecified symptoms and signs involving cognitive functions following cerebral infarction: Secondary | ICD-10-CM

## 2017-04-17 DIAGNOSIS — R7303 Prediabetes: Secondary | ICD-10-CM

## 2017-04-17 DIAGNOSIS — R0989 Other specified symptoms and signs involving the circulatory and respiratory systems: Secondary | ICD-10-CM

## 2017-04-17 LAB — GLUCOSE, CAPILLARY
GLUCOSE-CAPILLARY: 101 mg/dL — AB (ref 65–99)
GLUCOSE-CAPILLARY: 98 mg/dL (ref 65–99)
Glucose-Capillary: 109 mg/dL — ABNORMAL HIGH (ref 65–99)
Glucose-Capillary: 101 mg/dL — ABNORMAL HIGH (ref 65–99)

## 2017-04-17 MED ORDER — POTASSIUM CHLORIDE CRYS ER 20 MEQ PO TBCR
20.0000 meq | EXTENDED_RELEASE_TABLET | Freq: Two times a day (BID) | ORAL | Status: AC
  Administered 2017-04-17 (×2): 20 meq via ORAL
  Filled 2017-04-17 (×2): qty 1

## 2017-04-17 MED ORDER — CEPHALEXIN 250 MG PO CAPS
500.0000 mg | ORAL_CAPSULE | Freq: Four times a day (QID) | ORAL | Status: DC
  Administered 2017-04-17 – 2017-04-23 (×24): 500 mg via ORAL
  Filled 2017-04-17 (×25): qty 2

## 2017-04-17 NOTE — Progress Notes (Signed)
Occupational Therapy Session Note  Patient Details  Name: Carly [name] MRN: [id number] Date of Birth: 05-Feb-1971  Today's Date: 04/17/2017 OT Individual Time: 2549-8264 OT Individual Time Calculation (min): 30 min    Short Term Goals: Week 2:  OT Short Term Goal 1 (Week 2): Continue working on established LTGs set at supervision level overall.  Skilled Therapeutic Interventions/Progress Updates:    Pt completed transfers from bed to wheelchair with min guard assist using the RW.  Mod instructional cueing for hand placement, to push up off of the surface she is sitting on and to not try and pull up on the walker.  She was transported to the ADL apartment where she practiced transfers to regular couch, bed, and simulated walk-in shower.  Pt overall min guard for all transfers.  Increased time and education used to help compensate for vision to locate edge of simulated shower.  Pt unable to recall shower setup or if she had a walk-in or tub shower.  Her cousin was present and was able to state she had a walk-in shower.  Pt returned to room via wheelchair at conclusion of session.  Soft touch call button in reach and safety belt in place.    Therapy Documentation Precautions:  Precautions Precautions: Fall Precaution Comments: PEG ,  impaired central vision Restrictions Weight Bearing Restrictions: No  Pain: Pain Assessment Pain Assessment: Faces Faces Pain Scale: Hurts a little bit Pain Type: Acute pain Pain Location: Abdomen Pain Descriptors / Indicators: Discomfort Pain Intervention(s): Repositioned;Emotional support ADL: See Function Navigator for Current Functional Status.   Therapy/Group: Individual Therapy  Tonita Bills OTR/L 04/17/2017, 4:45 PM

## 2017-04-17 NOTE — Progress Notes (Addendum)
[place] PHYSICAL MEDICINE & REHABILITATION     PROGRESS NOTE  Subjective/Complaints:  Pt seen laying in bed this AM.  She slept well overnight.  She states she feels better this AM.    ROS: Denies CP, SOB, N/V/D.  Objective: Vital Signs: Blood pressure 139/79, pulse 86, temperature 98.4 F (36.9 C), temperature source Oral, resp. rate 18, height 5\' 7"  (1.702 m), weight 94.8 kg (208 lb 15.9 oz), SpO2 97 %. Ir Replc Gastro/colonic Tube Percut W/fluoro  Result Date: 04/15/2017 INDICATION: 46 year old female with an endoscopically placed percutaneous gastrostomy tube which has become dislodged through the anterior wall of the stomach and into the subcutaneous fat. She presents for attempted tube rescue and replacement. EXAM: GASTROSTOMY CATHETER REPLACEMENT MEDICATIONS: None ANESTHESIA/SEDATION: 1 mg Dilaudid was administered intravenously. CONTRAST:  20 mL Isovue 370 - administered into the gastric lumen. FLUOROSCOPY TIME:  Fluoroscopy Time: 3 minutes 30 seconds (41 mGy). COMPLICATIONS: None immediate. PROCEDURE: Informed written consent was obtained from the patient after a thorough discussion of the procedural risks, benefits and alternatives. All questions were addressed. Maximal Sterile Barrier Technique was utilized including caps, mask, sterile gowns, sterile gloves, sterile drape, hand hygiene and skin antiseptic. A timeout was performed prior to the initiation of the procedure. The existing percutaneous gastrostomy tube was injected with contrast material in a steep LAO oblique configuration. There is a persistent but small patent tract through the subcutaneous fat and into the stomach. Maintaining this angulation, a glide wire and 5 French catheter were navigated through the tract and into the stomach. A gentle hand injection of contrast material through the catheter confirmed its intragastric location. The glidewire was then coiled within the gastric lumen. The existing percutaneous  gastrostomy tube was removed over the wire. The soft tissue tract was then dilated to 72 [place] and a 24 [place] balloon retention gastrostomy tube was advanced over the wire and into the stomach. The retention balloon was inflated with 10 mL of sterile saline and pulled gently against the anterior abdominal wall. The external bumper was fixed in place. A repeat contrast injection confirms the tube to be intragastric in location. IMPRESSION: Successful salvage of percutaneous gastrostomy tube. A new 24 French balloon retention tube was successfully placed in the gastric lumen. Electronically Signed   By: Jacqulynn Cadet M.D.   On: 04/15/2017 15:12    Recent Labs  04/14/17 1504 04/16/17 0534  WBC 11.8* 9.0  HGB 9.7* 9.4*  HCT 30.7* 30.7*  PLT 331 356    Recent Labs  04/14/17 1504 04/15/17 0440  NA 137 142  K 3.7 3.4*  CL 100* 106  GLUCOSE 124* 134*  BUN 6 <5*  CREATININE 0.97 1.04*  CALCIUM 8.8* 8.5*   CBG (last 3)   Recent Labs  04/16/17 1654 04/16/17 2105 04/17/17 0637  GLUCAP 110* 167* 101*    Wt Readings from Last 3 Encounters:  04/17/17 94.8 kg (208 lb 15.9 oz)  04/09/17 98.2 kg (216 lb 7.9 oz)  02/25/17 109 kg (240 lb 3.2 oz)    Physical Exam:  BP 139/79 (BP Location: Left Arm)   Pulse 86   Temp 98.4 F (36.9 C) (Oral)   Resp 18   Ht 5\' 7"  (1.702 m)   Wt 94.8 kg (208 lb 15.9 oz)   LMP  (Within Months)   SpO2 97%   BMI 32.73 kg/m  Constitutional: She appears well-developed. Obese  HENT: Normocephalic and atraumatic.  Eyes: No discharge. EOMI. Cardiovascular: RRR. No JVD. Respiratory: Effortnormal  and breath sounds normal.  GI: Soft. Bowel sounds are normal. +PEG c/d/i, mildly tender abdomen, continues to improve.   Musculoskeletal: She exhibits no edema or tenderness.  Neurological: She is alert and orientation x1 (response time improvign).  Right gaze preference.   Does not make eye contact Motor: B/l UE 4/5 proximal to distal RLE: 4/5 HF, KE,  4+/5 ADF/PF (slowly improving) LLE: 4/5 HF, KE, 4+/5 ADF/PF Skin: Skin is warm and dry. She is not diaphoretic.  Psychiatric: Her affect is blunt. Her speech is delayed. She is slowed. She does not express impulsivity. She is attentive.    Assessment/Plan: 1. Functional deficits secondary to left frontal hemorrhage s/p coiling which require 3+ hours per day of interdisciplinary therapy in a comprehensive inpatient rehab setting. Physiatrist is providing close team supervision and 24 hour management of active medical problems listed below. Physiatrist and rehab team continue to assess barriers to discharge/monitor patient progress toward functional and medical goals.  Function:  Bathing Bathing position   Position: Wheelchair/chair at sink  Bathing parts Body parts bathed by patient: Right arm, Left arm, Chest, Abdomen, Front perineal area, Right upper leg, Left upper leg, Right lower leg, Left lower leg, Buttocks Body parts bathed by helper: Back  Bathing assist Assist Level: Touching or steadying assistance(Pt > 75%)      Upper Body Dressing/Undressing Upper body dressing   What is the patient wearing?: Pull over shirt/dress     Pull over shirt/dress - Perfomed by patient: Put head through opening, Pull shirt over trunk, Thread/unthread left sleeve Pull over shirt/dress - Perfomed by helper: Thread/unthread right sleeve        Upper body assist Assist Level: Supervision or verbal cues      Lower Body Dressing/Undressing Lower body dressing   What is the patient wearing?: Pants, Non-skid slipper socks     Pants- Performed by patient: Thread/unthread right pants leg, Pull pants up/down, Thread/unthread left pants leg Pants- Performed by helper: Thread/unthread left pants leg Non-skid slipper socks- Performed by patient: Don/doff right sock, Don/doff left sock Non-skid slipper socks- Performed by helper: Don/doff right sock, Don/doff left sock                  Lower  body assist Assist for lower body dressing: Touching or steadying assistance (Pt > 75%)      Toileting Toileting   Toileting steps completed by patient: Adjust clothing prior to toileting, Performs perineal hygiene Toileting steps completed by helper: Adjust clothing after toileting    Toileting assist     Transfers Chair/bed transfer   Chair/bed transfer method: Stand pivot Chair/bed transfer assist level: Touching or steadying assistance (Pt > 75%) Chair/bed transfer assistive device: Armrests, Medical sales representative     Max distance: 33 ft Assist level: Touching or steadying assistance (Pt > 75%)   Wheelchair   Type: Manual Max wheelchair distance: 141ft  Assist Level: Touching or steadying assistance (Pt > 75%)  Cognition Comprehension Comprehension assist level: Understands basic 90% of the time/cues < 10% of the time  Expression Expression assist level: Expresses basic 90% of the time/requires cueing < 10% of the time.  Social Interaction Social Interaction assist level: Interacts appropriately 75 - 89% of the time - Needs redirection for appropriate language or to initiate interaction.  Problem Solving Problem solving assist level: Solves basic 50 - 74% of the time/requires cueing 25 - 49% of the time  Memory Memory assist level: Recognizes or recalls 50 -  74% of the time/requires cueing 25 - 49% of the time    Medical Problem List and Plan: 1.  Generalized weakness secondary to left frontal hemorrhage s/p coiling.  Cont CIR 2.  DVT Prophylaxis/Anticoagulation: Mechanical: Sequential compression devices, below knee Bilateral lower extremities  Per Neurosurg, no chemical prophylaxis 3. H/o migraines/Pain Management: tylenol prn for HA 4. Mood: lacks insight/awareness of deficits. LCSW to follow for evaluation and support when appropriate.  5. Neuropsych: This patient is not capable of making decisions on her own behalf. 6. Skin/Wound Care: routine pressure  relief  measures 7. Fluids/Electrolytes/Nutrition: Started calorie count.  PO intake remains variable 8. Prediabetes: Hgb A1c- 5.9. Will continue to monitor BS ac/hs for now  Relatively controlled 8/2 9. ABLA:   Hb 9.4 on 8/1 10. 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  12. Labile blood pressure  Cont to monitor with increased mobility  Relatively controlled 8/2 13. Transaminitis  Cont to monitor, improving 14. Sepsis  WBCs 9.0 on 8/1  CXR reviewed, unremarkable  UA equivocal, Ucx insig growth  Blood cx pending 8/2  Cont IVF  Abd CT reviewed, showing dislodged PEG and inflammation, PEG exchanged 7/31  Cont Vanc/Zosyn -Vanc level elevated.  Will speak to pharmacy and consider oral abx 14. Hypokalemia  K+ 3.4 on 7/31  Will supplement x1  LOS (Days) 8 A FACE TO FACE EVALUATION WAS PERFORMED  Rohil Lesch Lorie Phenix 04/17/2017 8:25 AM

## 2017-04-17 NOTE — Progress Notes (Signed)
Occupational Therapy Session Note  Patient Details  Name: Carly [name] MRN: [id number] Date of Birth: Feb 23, 1971  Today's Date: 04/17/2017 OT Individual Time: 1000-1106 OT Individual Time Calculation (min): 66 min    Short Term Goals: Week 1:  OT Short Term Goal 1 (Week 1): Pt will perform LB bathing sit to stand with min assist.  OT Short Term Goal 1 - Progress (Week 1): Met OT Short Term Goal 2 (Week 1): Pt will perform LB dressing sit to stand with min assist.   OT Short Term Goal 2 - Progress (Week 1): Met OT Short Term Goal 3 (Week 1): Pt will complete toilet transfers with min assist RW to 3:1. OT Short Term Goal 3 - Progress (Week 1): Met OT Short Term Goal 4 (Week 1): Pt completed walk-in shower transfers with the RW and min assist. OT Short Term Goal 4 - Progress (Week 1): Met OT Short Term Goal 5 (Week 1): Pt will locate 3-4 grooming items on top of sink with no more than min instructional cueing.   OT Short Term Goal 5 - Progress (Week 1): Not met Week 2:  OT Short Term Goal 1 (Week 2): Continue working on established LTGs set at supervision level overall.  Skilled Therapeutic Interventions/Progress Updates:    Pt seen for BADL retraining utilizing visual compensation strategies. Pt was able to sit to stand to RW and maintain standing balance during self care with S. Touching A as she ambulated in and out of bathroom 2x with RW and S with toilet transfers. Pt had an upset stomach and needed to toilet 2x.  She did well with clothing management and cleansing. Bathed LB sitting on BSC over toilet.  She only needed min cues to navigate through tight areas with RW. She stated she can see some light in her central vision. Pt utilized head turns to see items/ furniture to compensate for central vision loss.  Pt resting in w/c with all needs met.  Therapy Documentation Precautions:  Precautions Precautions: Fall Precaution Comments: PEG ,  impaired central  vision Restrictions Weight Bearing Restrictions: No    Pain: Pain Assessment Pain Assessment: No/denies pain ADL:    See Function Navigator for Current Functional Status.   Therapy/Group: Individual Therapy  Anabela Crayton 04/17/2017, 12:24 PM

## 2017-04-17 NOTE — Progress Notes (Addendum)
Physical Therapy Weekly Progress Note  Patient Details  Name: Carly [name] MRN: [id number] Date of Birth: 05-14-1971  Beginning of progress report period: April 10, 2017 End of progress report period: April 17, 2017  Today's Date: 04/17/2017 PT Individual Time: 1131-1205 AND 1530-1615  PT Individual Time Calculation (min): 34 min AND 45 min  Patient has met 3 of 3 short term goals. She is performing all mobility at a Min guard to supervision level secondary to decreased static/dynamic standing balance and visual acuity/perception of environment. She is ambulating 75-150' a a time w/ RW before requiring seated rest breaks secondary to decreased endurance. She is able to negotiate large obstacles in less-busy environments, however requires verbal cuing to ambulate in a straight path and to avoid obstacles in busier environments.   Patient continues to demonstrate the following deficits muscle weakness, decreased cardiorespiratoy endurance, decreased visual acuity and decreased visual perceptual skills, and decreased standing balance and decreased balance strategies and therefore will continue to benefit from skilled PT intervention to increase functional independence with mobility.   Patient progressing toward long term goals..  Continue plan of care.  PT Short Term Goals Week 1:  PT Short Term Goal 1 (Week 1): Pt will ambulated 50 ft with min assist and LRAD PT Short Term Goal 1 - Progress (Week 1): Met PT Short Term Goal 2 (Week 1): Pt will propel w/c 150 ft with supervision  PT Short Term Goal 2 - Progress (Week 1): Met PT Short Term Goal 3 (Week 1): Pt will maintain dynamic standing balance during functional activitiy with min A and without UE support PT Short Term Goal 3 - Progress (Week 1): Met  Week 2: PT Short Term Goal 1 (Week 2): = LTGs due to ELOS  Skilled Therapeutic Interventions/Progress Updates:   Session 1:   Pt in w/c upon arrival and agreeable to therapy. No c/o pain. Pt  ambulated 150' x3 w/ RW and Min guard to work on functional endurance, mild increase in work of breathing. Seated rest breaks secondary to fatigue, resolves w/ rest. Ended session in w/c, call bell within reach and all needs met.   Session 2:   W/c mobility to increase functional endurance, 150' w/ supervision and increased time.  NuStep to increase functional endurance and LE strength, 22 min @ L3 w/o rest breaks.  Ended session supine in bed, call bell within reach and all needs met.   Therapy Documentation Precautions:  Precautions Precautions: Fall Precaution Comments: PEG ,  impaired central vision Restrictions Weight Bearing Restrictions: No Pain: Pain Assessment Pain Assessment: No/denies pain  See Function Navigator for Current Functional Status.  Therapy/Group: Individual Therapy  Carly [name] 04/17/2017, 1:26 PM

## 2017-04-17 NOTE — Progress Notes (Signed)
Speech Language Pathology Daily Session Note  Patient Details  Name: Carly [name] MRN: [id number] Date of Birth: Apr 02, 1971  Today's Date: 04/17/2017 SLP Individual Time: 0804-0900 SLP Individual Time Calculation (min): 56 min  Short Term Goals: Week 1: SLP Short Term Goal 1 (Week 1): Pt will utilize external memory aids to recall new daily information with Min A cues.  SLP Short Term Goal 2 (Week 1): Pt will recall orientation information with Min A cues.  SLP Short Term Goal 3 (Week 1): Pt will initiate basic familiar tasks within 5 seconds in 75% of opportunites with Mod A cues.  SLP Short Term Goal 4 (Week 1): Pt will complete basic, familiar tasks within ADLs activities with Mod A cues.  SLP Short Term Goal 5 (Week 1): Pt will utilize increased vocal intensity with Mod A cues to achieve ~ 75% intelligilbity at the sentenc level. SLP Short Term Goal 6 (Week 1): Pt will locate objects in all fields of vision in 50% of opportunties with Mod A cues.   Skilled Therapeutic Interventions:  Pt was seen for skilled ST targeting cognitive goals.  SLP facilitated the session with mod assist verbal cues for recall of her medications from yesterday's therapy session; however, after a 30 minute delay, pt was able to recall 2 out of 4 of her medications independently, improving to 4 out of 4 recall with min assist verbal cues.  Therapist discussed memory compensatory strategies, emphasizing repetition to facilitate storage of new information.  Pt utilized the abovementioned strategy during a verbal paragraph recall task with min assist instructional cues.  Pt was left in bed with bed alarm set and call bell within reach.  Continue per current plan of care.     Function:  Eating Eating                 Cognition Comprehension Comprehension assist level: Follows basic conversation/direction with no assist  Expression   Expression assist level: Expresses basic needs/ideas: With no assist  Social  Interaction Social Interaction assist level: Interacts appropriately 90% of the time - Needs monitoring or encouragement for participation or interaction.  Problem Solving Problem solving assist level: Solves basic 75 - 89% of the time/requires cueing 10 - 24% of the time  Memory Memory assist level: Recognizes or recalls 75 - 89% of the time/requires cueing 10 - 24% of the time    Pain Pain Assessment Pain Assessment: No/denies pain  Therapy/Group: Individual Therapy  Jacey Eckerson, Selinda Orion 04/17/2017, 9:01 AM

## 2017-04-18 ENCOUNTER — Inpatient Hospital Stay (HOSPITAL_COMMUNITY): Payer: 59 | Admitting: Physical Therapy

## 2017-04-18 ENCOUNTER — Inpatient Hospital Stay (HOSPITAL_COMMUNITY): Payer: 59 | Admitting: Occupational Therapy

## 2017-04-18 ENCOUNTER — Inpatient Hospital Stay (HOSPITAL_COMMUNITY): Payer: 59 | Admitting: Speech Pathology

## 2017-04-18 DIAGNOSIS — E876 Hypokalemia: Secondary | ICD-10-CM

## 2017-04-18 LAB — CULTURE, BLOOD (ROUTINE X 2)
Culture: NO GROWTH
Culture: NO GROWTH
SPECIAL REQUESTS: ADEQUATE
Special Requests: ADEQUATE

## 2017-04-18 LAB — GLUCOSE, CAPILLARY
GLUCOSE-CAPILLARY: 150 mg/dL — AB (ref 65–99)
GLUCOSE-CAPILLARY: 98 mg/dL (ref 65–99)
GLUCOSE-CAPILLARY: 124 mg/dL — AB (ref 65–99)
Glucose-Capillary: 94 mg/dL (ref 65–99)

## 2017-04-18 LAB — BASIC METABOLIC PANEL
Anion gap: 8 (ref 5–15)
CHLORIDE: 107 mmol/L (ref 101–111)
CO2: 26 mmol/L (ref 22–32)
Calcium: 9.3 mg/dL (ref 8.9–10.3)
Creatinine, Ser: 1.21 mg/dL — ABNORMAL HIGH (ref 0.44–1.00)
GFR calc non Af Amer: 52 mL/min — ABNORMAL LOW (ref >60)
Glucose, Bld: 127 mg/dL — ABNORMAL HIGH (ref 65–99)
POTASSIUM: 3.3 mmol/L — AB (ref 3.5–5.1)
SODIUM: 141 mmol/L (ref 135–145)

## 2017-04-18 MED ORDER — POTASSIUM CHLORIDE CRYS ER 20 MEQ PO TBCR
20.0000 meq | EXTENDED_RELEASE_TABLET | Freq: Two times a day (BID) | ORAL | Status: AC
  Administered 2017-04-18 – 2017-04-20 (×6): 20 meq via ORAL
  Filled 2017-04-18 (×6): qty 1

## 2017-04-18 NOTE — Progress Notes (Signed)
Speech Language Pathology Weekly Progress and Session Note  Patient Details  Name: SRIYA KROEZE MRN: [id number] Date of Birth: [birth year]  Beginning of progress report period:  April 11, 2017 End of progress report period:  April 18, 2017   Today's Date: 04/18/2017 SLP Individual Time: 3845-3646 SLP Individual Time Calculation (min): 45 min  Short Term Goals: Week 1: SLP Short Term Goal 1 (Week 1): Pt will utilize external memory aids to recall new daily information with Min A cues.  SLP Short Term Goal 1 - Progress (Week 1): Met SLP Short Term Goal 2 (Week 1): Pt will recall orientation information with Min A cues.  SLP Short Term Goal 2 - Progress (Week 1): Met SLP Short Term Goal 3 (Week 1): Pt will initiate basic familiar tasks within 5 seconds in 75% of opportunites with Mod A cues.  SLP Short Term Goal 3 - Progress (Week 1): Met SLP Short Term Goal 4 (Week 1): Pt will complete basic, familiar tasks within ADLs activities with Mod A cues.  SLP Short Term Goal 4 - Progress (Week 1): Met SLP Short Term Goal 5 (Week 1): Pt will utilize increased vocal intensity with Mod A cues to achieve ~ 75% intelligilbity at the sentenc level. SLP Short Term Goal 5 - Progress (Week 1): Met SLP Short Term Goal 6 (Week 1): Pt will locate objects in all fields of vision in 50% of opportunties with Mod A cues.  SLP Short Term Goal 6 - Progress (Week 1): Progressing toward goal    New Short Term Goals: Week 2: SLP Short Term Goal 1 (Week 2): Pt will utilize external memory aids to recall new daily information with supervision cues.  SLP Short Term Goal 2 (Week 2): Pt will complete basic, familiar tasks within ADLs activities with Min A cues.  SLP Short Term Goal 3 (Week 2): Pt will utilize increased vocal intensity with supervision cues to achieve ~ 75% intelligilbity at the sentenc level. SLP Short Term Goal 4 (Week 2): Pt will locate objects in all fields of vision in 50% of opportunties with Mod A  cues.   Weekly Progress Updates:  Pt has made functional gains and has met 5 out of 6 short term goals.  Pt has demonstrated improvements in task initiation and orientation and is overall min-mod assist for basic tasks.  Progression in therapies remains limited by significant visual deficits with limited capacity for compensation.  Pt and family education is ongoing.  As a result, pt would continue to benefit from skilled ST while inpatient in order to maximize functional independence and reduce burden of care prior to discharge.    Intensity: Minumum of 1-2 x/day, 30 to 90 minutes Frequency: 3 to 5 out of 7 days Duration/Length of Stay: 3 weeks Treatment/Interventions: Cognitive remediation/compensation;Cueing hierarchy;Environmental controls;Functional tasks;Internal/external aids;Medication managment;Speech/Language facilitation;Patient/family education   Daily Session  Skilled Therapeutic Interventions: Pt was seen for skilled ST targeting cognitive goals.  SLP facilitated the session with verbal abstract reasoning tasks to address problem solving and recall goals.  Pt was very lethargic upon therapist's arrival and as a result required mod-max assist to complete task.  However, during a less structured menu organization task and with participation from multiple family members in the room, pt was able to recall basic components of a simple recipe with supervision question cues.  Pt was left in bed with family members at bedside.  Goals updated on this date to reflect progress and current plan of care.  Function:   Eating Eating                 Cognition Comprehension Comprehension assist level: Follows basic conversation/direction with no assist  Expression   Expression assist level: Expresses basic needs/ideas: With extra time/assistive device  Social Interaction Social Interaction assist level: Interacts appropriately 90% of the time - Needs monitoring or encouragement for  participation or interaction.  Problem Solving Problem solving assist level: Solves basic 50 - 74% of the time/requires cueing 25 - 49% of the time  Memory Memory assist level: Recognizes or recalls 50 - 74% of the time/requires cueing 25 - 49% of the time   General    Pain Pain Assessment Pain Assessment: No/denies pain   Therapy/Group: Individual Therapy  Della Homan, Selinda Orion 04/18/2017, 4:17 PM

## 2017-04-18 NOTE — Progress Notes (Signed)
Physical Therapy Session Note  Patient Details  Name: Carly [name] MRN: [id number] Date of Birth: [birth year]  Today's Date: 04/18/2017 PT Individual Time: 0800-0856 PT Individual Time Calculation (min): 56 min   Short Term Goals: Week 1:  PT Short Term Goal 1 (Week 1): Pt will ambulated 50 ft with min assist and LRAD PT Short Term Goal 1 - Progress (Week 1): Met PT Short Term Goal 2 (Week 1): Pt will propel w/c 150 ft with supervision  PT Short Term Goal 2 - Progress (Week 1): Met PT Short Term Goal 3 (Week 1): Pt will maintain dynamic standing balance during functional activitiy with min A and without UE support PT Short Term Goal 3 - Progress (Week 1): Met Week 2:  PT Short Term Goal 1 (Week 2): = LTGs due to ELOS  Skilled Therapeutic Interventions/Progress Updates:   Pt supine in bed upon arrival and agreeable to therapy, no c/o pain, daughter Harl Bowie) present throughout session. Educated daughter on stand pivot transfers and ambulation to/from bathroom w/ RW and close supervision provided to pt. Daughter and pt both verbalized understanding and returned demonstration multiple times w/o cues. Daughter is safe to perform transfers.   Ambulated to/from therapy gym w/ supervision and RW, 150' x2, mild increase in work of breathing  NMR w/ supervision, no UE support   -Dynamic sitting while eating breakfast w/ verbal cues secondary to impaired vision  -Dynamic standing balance on airex pad, 60 sec x2  -Sit<>stand 5 reps x3 w/ supervision, verbal cues to not use UE support   Ended session in chair in care of daughter, all needs met.  Therapy Documentation Precautions:  Precautions Precautions: Fall Precaution Comments: PEG ,  impaired central vision Restrictions Weight Bearing Restrictions: No Vital Signs: Therapy Vitals Temp: 97.9 F (36.6 C) Temp Source: Oral Pulse Rate: 90 Resp: 18 BP: 140/89 Patient Position (if appropriate): Sitting Oxygen Therapy SpO2: 100 % O2  Device: Not Delivered  See Function Navigator for Current Functional Status.   Therapy/Group: Individual Therapy  Amisha Pospisil K Arnette 04/18/2017, 8:58 AM

## 2017-04-18 NOTE — Progress Notes (Signed)
Occupational Therapy Session Note  Patient Details  Name: Carly [name] MRN: [id number] Date of Birth: 1971/05/05  Today's Date: 04/18/2017 OT Individual Time: 3953-2023 OT Individual Time Calculation (min): 43 min    Short Term Goals: Week 2:  OT Short Term Goal 1 (Week 2): Continue working on established LTGs set at supervision level overall.  Skilled Therapeutic Interventions/Progress Updates:    Pt transferred from supine to sit EOB with supervision, without use of the bedrails.  Spouse present during session as well.  Pt ambulated to the ADL apartment where she practiced tub/shower transfers with use of the RW and tub bench.  She was able to complete with supervision and min instructional cueing for sequencing secondary to decreased balance.  Spouse educated on equipment needed for home as well including tub bench and 3:1.  Returned to room via wheelchair and completed session with education on bilateral shoulder abduction exercises using light orange theraband and theraputty exercises for gross grip strength.  Pt also completed 1 set of 12 repetitions for shoulder flexion with mod assist with each UE as well.  Educated family to assist with shoulder abduction exercises and theraputty exercises over the weekend.  Pt left sitting in wheelchair with family present.   Therapy Documentation Precautions:  Precautions Precautions: Fall Precaution Comments: PEG ,  impaired central vision Restrictions Weight Bearing Restrictions: No  Pain: Pain Assessment Pain Assessment: No/denies pain ADL: See Function Navigator for Current Functional Status.   Therapy/Group: Individual Therapy  Skylynne Schlechter OTR/L 04/18/2017, 4:32 PM

## 2017-04-18 NOTE — Progress Notes (Signed)
Occupational Therapy Session Note  Patient Details  Name: Carly [name] MRN: [id number] Date of Birth: 1971/08/10  Today's Date: 04/18/2017 OT Individual Time: 1003-1101 OT Individual Time Calculation (min): 58 min    Short Term Goals: Week 2:  OT Short Term Goal 1 (Week 2): Continue working on established LTGs set at supervision level overall.  Skilled Therapeutic Interventions/Progress Updates:    Pt ambulated to the shower with use of the RW and supervision.  She was able to remove all pajama clothing with supervision once on the shower seat sit to stand.  She was able to shower with supervision overall but needed mod instructional cueing to sequence secondary to not remembering to wash her lower legs or the LUE.  She repeatedly kept washing her UB and her peri area, without sequencing to the next step.  She was able to complete all dressing sit to stand, including orientation of clothing using tactile aides.  Finished session with pt sitting on the EOB with family present.  Pt still with significant memory and vision limitations.  Discussed progress with spouse who was present at end of session.    Therapy Documentation Precautions:  Precautions Precautions: Fall Precaution Comments: PEG ,  impaired central vision Restrictions Weight Bearing Restrictions: No  Pain: Pain Assessment Pain Assessment: Faces Pain Score: 4  Pain Location: Abdomen Pain Orientation: Right;Mid Pain Descriptors / Indicators: Discomfort Patients Stated Pain Goal: 2 Pain Intervention(s): Medication (See eMAR);Repositioned ADL: See Function Navigator for Current Functional Status.   Therapy/Group: Individual Therapy  Depaul Arizpe OTR/L 04/18/2017, 12:28 PM

## 2017-04-18 NOTE — Progress Notes (Signed)
Social Work Patient ID: Carly [name], female   DOB: 01-24-1971, 46 y.o.   MRN: [id number]   Have reviewed team conference with pt and spouse.  Both aware and agreeable with targeted d/c date of 8/8 and supervision goals overall.  Will discuss follow up further with therapies with HH vs OP.  Husband attending therapies today.  Elmon Shader, LCSW

## 2017-04-18 NOTE — Patient Care Conference (Signed)
Inpatient RehabilitationTeam Conference and Plan of Care Update Date: 04/16/2017   Time: 2:15 PM    Patient Name: Carly [name]      Medical Record Number: [id number]  Date of Birth: 1971-05-02 Sex: Female         Room/Bed: 4M05C/4M05C-01 Payor Info: Payor: [employer] / Plan: [employer] INDEMNITY / Product Type: *No Product type* /    Admitting Diagnosis: L CA Aneurysm  Admit Date/Time:  04/09/2017  3:05 PM Admission Comments: No comment available   Primary Diagnosis:  <principal problem not specified> Principal Problem: <principal problem not specified>  Patient Active Problem List   Diagnosis Date Noted  . Dislodged gastrostomy tube ([place])   . Sepsis ([place])   . Hypokalemia   . Elevated temperature   . SIRS (systemic inflammatory response syndrome) ([facility])   . Decreased oral intake   . Cognitive deficit, post-stroke   . Prediabetes   . Labile blood pressure   . Transaminitis   . 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 Hydrocephalus   . Level of consciousness decreased   . SAH (subarachnoid hemorrhage) ([place])   . Tachycardia   . PEG (percutaneous endoscopic gastrostomy) status ([place])   . Acute blood loss anemia   . Tachypnea   . Ventilator dependent ([place])   . Palliative care by specialist   . Electrolyte imbalance 03/19/2017  . History of ETT   . Encounter for intubation   . Subarachnoid hemorrhage ([place])   . Acute respiratory failure ([place])   . 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 Routine general medical examination at a health care facility 08/23/2013  . Essential hypertension, benign 08/23/2013  . Impaired fasting glucose 08/23/2013  . Plantar fasciitis, bilateral 08/23/2013  . Unspecified vitamin D deficiency 08/23/2013  . Obesity, unspecified 08/23/2013  . Unspecified essential hypertension 12/31/2012  . GERD (gastroesophageal reflux disease) 12/31/2012  . COMMON MIGRAINE 06/16/2008  . CHEST PAIN, INTERMITTENT 06/16/2008    Expected Discharge Date: Expected  Discharge Date: 04/23/17  Team Members Present: Physician leading conference: Dr. Delice Lesch Social Worker Present: Lennart Pall, LCSW Nurse Present: Dorthula Nettles, RN PT Present: Michaelene Song, PT OT Present: Clyda Greener, OT SLP Present: Charolett Bumpers, SLP PPS Coordinator present : Daiva Nakayama, RN, CRRN     Current Status/Progress Goal Weekly Team Focus  Medical   Generalized weakness and showing increased ability to follow commands s/p left frontal hemorrhage s/p coiling  Improve mobility, safety, cognition, sepsis  See above   Bowel/Bladder   Continent of B/B   Maintain continence      Swallow/Nutrition/ Hydration             ADL's   supervison for UB bathing and dressing, min assist for LB bathing and dressing,  Min assist for transfers to the walk-in shower and to the toilet.  Decreased central vision bilaterally  supervision overall  vision compensation, selfcare retraining, transfer training, balance retraining, AE/DME education, pt/family education,   Mobility   Min-Mod A transfers and gait 10' RW/HHA  Supervision  Transfers, gait, tolerance to upright activity, endurance   Communication   Min  supervision  carryover strategies, family reports at baseline   Safety/Cognition/ Behavioral Observations  Min-Mod   supervision  recall, orientation, task initation and locating objects within visual field    Pain   continue to verbalize discomfort/headache rating 7/20 o pain scale   pain goal score 3      Skin   Peg tube site exchanged 04/15/17  skin to remain free from infection  assess skin q shift and prn    Rehab Goals Patient on target to meet rehab goals: Yes *See Care Plan and progress notes for long and short-term goals.     Barriers to Discharge  Current Status/Progress Possible Resolutions Date Resolved   Physician    Medical stability;Decreased caregiver support;IV antibiotics;Nutrition means;Other (comments)  hypokalemia  See above  Therapies, IV abx,  peg exchanged, K+ supplemented      Nursing  Lack of/limited family support  Visual impairment            PT  Inaccessible home environment;Decreased caregiver support;Lack of/limited family support;Home environment access/layout                 OT                  SLP                SW                Discharge Planning/Teaching Needs:  Plan home with family to arrange 24/7 supervision/ assistance  Teaching to be done prior to d/c   Team Discussion:  Peg changed out yesterday;  On abx now;  Flat affect and poor po.  Supervision to min/guard with self care and min cues.  Orientation can vary.  Peripheral vision is good but very poor central vision.  Ambulating with min assist.  cogniton currently at min-mod assist.  Poor initiation.  Goals overall at supervision  Revisions to Treatment Plan:  None    Continued Need for Acute Rehabilitation Level of Care: The patient requires daily medical management by a physician with specialized training in physical medicine and rehabilitation for the following conditions: Daily direction of a multidisciplinary physical rehabilitation program to ensure safe treatment while eliciting the highest outcome that is of practical value to the patient.: Yes Daily medical management of patient stability for increased activity during participation in an intensive rehabilitation regime.: Yes Daily analysis of laboratory values and/or radiology reports with any subsequent need for medication adjustment of medical intervention for : Neurological problems;Mood/behavior problems;Nutritional problems;Other  James Senn 04/18/2017, 1:09 PM

## 2017-04-18 NOTE — Progress Notes (Signed)
[facility] PHYSICAL MEDICINE & REHABILITATION     PROGRESS NOTE  Subjective/Complaints:  Abd pain last noc around PEG, no sweats or chills  ROS: Denies CP, SOB, N/V/D.  Objective: Vital Signs: Blood pressure 140/89, pulse 90, temperature 97.9 F (36.6 C), temperature source Oral, resp. rate 18, height 5\' 7"  (1.702 m), weight 94.5 kg (208 lb 4.8 oz), SpO2 100 %. No results found.  Recent Labs  04/16/17 0534  WBC 9.0  HGB 9.4*  HCT 30.7*  PLT 356    Recent Labs  04/18/17 0537  NA 141  K 3.3*  CL 107  GLUCOSE 127*  BUN <5*  CREATININE 1.21*  CALCIUM 9.3   CBG (last 3)   Recent Labs  04/17/17 1702 04/17/17 2154 04/18/17 0643  GLUCAP 98 101* 150*    Wt Readings from Last 3 Encounters:  04/18/17 94.5 kg (208 lb 4.8 oz)  04/09/17 98.2 kg (216 lb 7.9 oz)  02/25/17 109 kg (240 lb 3.2 oz)    Physical Exam:  BP 140/89 (BP Location: Left Arm)   Pulse 90   Temp 97.9 F (36.6 C) (Oral)   Resp 18   Ht 5\' 7"  (1.702 m)   Wt 94.5 kg (208 lb 4.8 oz)   LMP  (Within Months)   SpO2 100%   BMI 32.62 kg/m  Constitutional: She appears well-developed. Obese  HENT: Normocephalic and atraumatic.  Eyes: No discharge. EOMI. Cardiovascular: RRR. No JVD. Respiratory: Effortnormal and breath sounds normal.  GI: Soft. Bowel sounds are normal. +PEG c/d/i, mildly tender abdomen, continues to improve.   Musculoskeletal: She exhibits no edema or tenderness.  Neurological: She is alert and orientation x1 (response time improvign).  Right gaze preference.   Does not make eye contact Motor: B/l UE 4/5 proximal to distal RLE: 4/5 HF, KE, 4+/5 ADF/PF (slowly improving) LLE: 4/5 HF, KE, 4+/5 ADF/PF Skin: Skin is warm and dry. She is not diaphoretic.  Psychiatric: Her affect is blunt. Her speech is delayed. She is slowed. She does not express impulsivity. She is attentive.    Assessment/Plan: 1. Functional deficits secondary to left frontal hemorrhage s/p coiling which require 3+  hours per day of interdisciplinary therapy in a comprehensive inpatient rehab setting. Physiatrist is providing close team supervision and 24 hour management of active medical problems listed below. Physiatrist and rehab team continue to assess barriers to discharge/monitor patient progress toward functional and medical goals.  Function:  Bathing Bathing position   Position: Other (comment) (sitting on commode)  Bathing parts Body parts bathed by patient: Right arm, Left arm, Chest, Abdomen, Front perineal area, Right upper leg, Left upper leg, Right lower leg, Left lower leg, Buttocks Body parts bathed by helper: Back  Bathing assist Assist Level: Supervision or verbal cues      Upper Body Dressing/Undressing Upper body dressing   What is the patient wearing?: Pull over shirt/dress     Pull over shirt/dress - Perfomed by patient: Put head through opening, Pull shirt over trunk, Thread/unthread left sleeve Pull over shirt/dress - Perfomed by helper: Thread/unthread right sleeve        Upper body assist Assist Level: Supervision or verbal cues      Lower Body Dressing/Undressing Lower body dressing   What is the patient wearing?: Pants, Non-skid slipper socks     Pants- Performed by patient: Thread/unthread right pants leg, Pull pants up/down, Thread/unthread left pants leg Pants- Performed by helper: Thread/unthread left pants leg Non-skid slipper socks- Performed by patient: Don/doff  right sock, Don/doff left sock Non-skid slipper socks- Performed by helper: Don/doff right sock, Don/doff left sock                  Lower body assist Assist for lower body dressing: Touching or steadying assistance (Pt > 75%)      Toileting Toileting   Toileting steps completed by patient: Adjust clothing prior to toileting, Performs perineal hygiene, Adjust clothing after toileting Toileting steps completed by helper: Adjust clothing after toileting    Toileting assist Assist level:  Supervision or verbal cues, Set up/obtain supplies   Transfers Chair/bed transfer   Chair/bed transfer method: Stand pivot Chair/bed transfer assist level: Touching or steadying assistance (Pt > 75%) Chair/bed transfer assistive device: Armrests, Medical sales representative     Max distance: 150' Assist level: Touching or steadying assistance (Pt > 75%)   Wheelchair   Type: Manual Max wheelchair distance: 150 Assist Level: Supervision or verbal cues  Cognition Comprehension Comprehension assist level: Follows basic conversation/direction with no assist  Expression Expression assist level: Expresses basic needs/ideas: With extra time/assistive device  Social Interaction Social Interaction assist level: Interacts appropriately 90% of the time - Needs monitoring or encouragement for participation or interaction.  Problem Solving Problem solving assist level: Solves basic 50 - 74% of the time/requires cueing 25 - 49% of the time  Memory Memory assist level: Recognizes or recalls 50 - 74% of the time/requires cueing 25 - 49% of the time    Medical Problem List and Plan: 1.  Generalized weakness secondary to left frontal hemorrhage s/p coiling.  Cont CIR 2.  DVT Prophylaxis/Anticoagulation: Mechanical: Sequential compression devices, below knee Bilateral lower extremities  Per Neurosurg, no pharm prophylaxis 3. H/o migraines/Pain Management: tylenol prn for HA 4. Mood: lacks insight/awareness of deficits. LCSW to follow for evaluation and support when appropriate.  5. Neuropsych: This patient is not capable of making decisions on her own behalf. 6. Skin/Wound Care: routine pressure relief  measures 7. Fluids/Electrolytes/Nutrition: Started calorie count.  PO intake remains variable 8. Prediabetes: Hgb A1c- 5.9. Will continue to monitor BS ac/hs for now  Relatively controlled 8/2 9. ABLA:   Hb 9.4 on 8/1 10. Electrolyte abnormality:    11. 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  12. Labile blood pressure  Cont to monitor with increased mobility  Relatively controlled 8/2 13. Transaminitis  Cont to monitor, improving 14. Sepsis  WBCs 9.0 on 8/1  CXR reviewed, unremarkable  UA equivocal, Ucx insig growth  Blood cx pending 8/2  Cont IVF  Abd CT reviewed, showing dislodged PEG and inflammation, PEG exchanged 7/31- still sore on 8/3  Cont Vanc/Zosyn -Vanc level elevated.   14. Hypokalemia  K+ 3.4 on 7/31, 3.3 on 8.3   Will supplement daily  LOS (Days) 9 A [place] EVALUATION WAS PERFORMED  KIRSTEINS,ANDREW E 04/18/2017 7:20 AM

## 2017-04-19 ENCOUNTER — Inpatient Hospital Stay (HOSPITAL_COMMUNITY): Payer: 59 | Admitting: Physical Therapy

## 2017-04-19 DIAGNOSIS — R6510 Systemic inflammatory response syndrome (SIRS) of non-infectious origin without acute organ dysfunction: Secondary | ICD-10-CM

## 2017-04-19 DIAGNOSIS — R740 Nonspecific elevation of levels of transaminase and lactic acid dehydrogenase [LDH]: Secondary | ICD-10-CM

## 2017-04-19 DIAGNOSIS — R509 Fever, unspecified: Secondary | ICD-10-CM

## 2017-04-19 LAB — GLUCOSE, CAPILLARY
GLUCOSE-CAPILLARY: 123 mg/dL — AB (ref 65–99)
Glucose-Capillary: 119 mg/dL — ABNORMAL HIGH (ref 65–99)
Glucose-Capillary: 128 mg/dL — ABNORMAL HIGH (ref 65–99)
Glucose-Capillary: 106 mg/dL — ABNORMAL HIGH (ref 65–99)

## 2017-04-19 NOTE — Progress Notes (Signed)
[facility] PHYSICAL MEDICINE & REHABILITATION     PROGRESS NOTE  Subjective/Complaints:  Occ abd pain  ROS: Denies CP, SOB, N/V/D.  Objective: Vital Signs: Blood pressure 118/75, pulse 94, temperature 99 F (37.2 C), temperature source Oral, resp. rate 20, height 5\' 7"  (1.702 m), weight 94.6 kg (208 lb 8.8 oz), SpO2 100 %. No results found. No results for input(s): WBC, HGB, HCT, PLT in the last 72 hours.  Recent Labs  04/18/17 0537  NA 141  K 3.3*  CL 107  GLUCOSE 127*  BUN <5*  CREATININE 1.21*  CALCIUM 9.3   CBG (last 3)   Recent Labs  04/18/17 1659 04/18/17 2115 04/19/17 0628  GLUCAP 98 124* 119*    Wt Readings from Last 3 Encounters:  04/19/17 94.6 kg (208 lb 8.8 oz)  04/09/17 98.2 kg (216 lb 7.9 oz)  02/25/17 109 kg (240 lb 3.2 oz)    Physical Exam:  BP 118/75 (BP Location: Left Arm)   Pulse 94   Temp 99 F (37.2 C) (Oral)   Resp 20   Ht 5\' 7"  (1.702 m)   Wt 94.6 kg (208 lb 8.8 oz)   LMP  (Within Months)   SpO2 100%   BMI 32.66 kg/m  Constitutional: She appears well-developed. Obese  HENT: Normocephalic and atraumatic.  Eyes: No discharge. EOMI. Cardiovascular: RRR. No JVD. Respiratory: Effortnormal and breath sounds normal.  GI: Soft. Bowel sounds are normal. +PEG c/d/i, mildly tender abdomen, continues to improve.   Musculoskeletal: She exhibits no edema or tenderness.  Neurological: She is alert and orientation x1 (response time improvign).  Right gaze preference.   Does not make eye contact Motor: B/l UE 4/5 proximal to distal RLE: 4/5 HF, KE, 4+/5 ADF/PF (slowly improving) LLE: 4/5 HF, KE, 4+/5 ADF/PF Skin: Skin is warm and dry. She is not diaphoretic.  Psychiatric: Her affect is blunt. Her speech is delayed. She is slowed. She does not express impulsivity. She is attentive.    Assessment/Plan: 1. Functional deficits secondary to left frontal hemorrhage s/p coiling which require 3+ hours per day of interdisciplinary therapy in a  comprehensive inpatient rehab setting. Physiatrist is providing close team supervision and 24 hour management of active medical problems listed below. Physiatrist and rehab team continue to assess barriers to discharge/monitor patient progress toward functional and medical goals.  Function:  Bathing Bathing position   Position: Shower  Bathing parts Body parts bathed by patient: Right arm, Left arm, Chest, Abdomen, Front perineal area, Right upper leg, Left upper leg, Right lower leg, Left lower leg, Buttocks Body parts bathed by helper: Back  Bathing assist Assist Level: Supervision or verbal cues      Upper Body Dressing/Undressing Upper body dressing   What is the patient wearing?: Pull over shirt/dress     Pull over shirt/dress - Perfomed by patient: Put head through opening, Pull shirt over trunk, Thread/unthread left sleeve Pull over shirt/dress - Perfomed by helper: Thread/unthread right sleeve        Upper body assist Assist Level: Supervision or verbal cues      Lower Body Dressing/Undressing Lower body dressing   What is the patient wearing?: Pants, Non-skid slipper socks     Pants- Performed by patient: Thread/unthread right pants leg, Pull pants up/down, Thread/unthread left pants leg Pants- Performed by helper: Thread/unthread left pants leg Non-skid slipper socks- Performed by patient: Don/doff right sock, Don/doff left sock Non-skid slipper socks- Performed by helper: Don/doff right sock, Don/doff left sock  Lower body assist Assist for lower body dressing: Supervision or verbal cues      Toileting Toileting   Toileting steps completed by patient: Adjust clothing prior to toileting, Performs perineal hygiene, Adjust clothing after toileting Toileting steps completed by helper: Adjust clothing prior to toileting, Performs perineal hygiene, Adjust clothing after toileting (per Elby Beck, NT)    Toileting assist Assist level: Supervision  or verbal cues   Transfers Chair/bed transfer   Chair/bed transfer method: Ambulatory Chair/bed transfer assist level: Supervision or verbal cues Chair/bed transfer assistive device: Walker, Air cabin crew     Max distance: 150' Assist level: Supervision or verbal cues   Wheelchair   Type: Manual Max wheelchair distance: 150 Assist Level: Supervision or verbal cues  Cognition Comprehension Comprehension assist level: Follows basic conversation/direction with no assist  Expression Expression assist level: Expresses basic needs/ideas: With extra time/assistive device  Social Interaction Social Interaction assist level: Interacts appropriately 90% of the time - Needs monitoring or encouragement for participation or interaction.  Problem Solving Problem solving assist level: Solves basic 50 - 74% of the time/requires cueing 25 - 49% of the time  Memory Memory assist level: Recognizes or recalls 50 - 74% of the time/requires cueing 25 - 49% of the time    Medical Problem List and Plan: 1.  Generalized weakness secondary to left frontal hemorrhage s/p coiling.  Cont CIR 2.  DVT Prophylaxis/Anticoagulation: Mechanical: Sequential compression devices, below knee Bilateral lower extremities  Per Neurosurg, no pharm prophyllaxis 3. H/o migraines/Pain Management: tylenol prn for HA 4. Mood: lacks insight/awareness of deficits. LCSW to follow for evaluation and support when appropriate.  5. Neuropsych: This patient is not capable of making decisions on her own behalf. 6. Skin/Wound Care: routine pressure relief  measures 7. Fluids/Electrolytes/Nutrition: Started calorie count.  PO intake remains variable 8. Prediabetes: Hgb A1c- 5.9. Will continue to monitor BS ac/hs for now   CBG (last 3) has TF  Recent Labs  04/18/17 1659 04/18/17 2115 04/19/17 0628  GLUCAP 98 124* 119*    9. ABLA:    Hb 9.4 on 8/1 10. Electrolyte abnormality:    11. Psychomotor  retardation: Will consider stimulant for activation/attention.  12. Labile blood pressure  Cont to monitor with increased mobility  Relatively controlled 8/2 13. Transaminitis  Cont to monitor, improving 14. Sepsis resolved  WBCs 9.0 on 8/1  CXR reviewed, unremarkable  UA equivocal, Ucx insig growth  Blood cx pending 8/2  Cont IVF  Abd CT reviewed, showing dislodged PEG and inflammation, PEG exchanged 7/31- still sore on 8/3  Switched from Vanc/Zosyn to Keflex 14. Hypokalemia  K+ 3.4 on 7/31, 3.3 on 8.3   Will supplement daily x 3 d recheck K on Monday  LOS (Days) 10 A FACE TO FACE EVALUATION WAS PERFORMED  Zori Benbrook E 04/19/2017 7:11 AM

## 2017-04-19 NOTE — Progress Notes (Signed)
Physical Therapy Session Note  Patient Details  Name: Carly [name] MRN: [id number] Date of Birth: [birth year]  Today's Date: 04/19/2017 PT Individual Time: 0800-0900 PT Individual Time Calculation (min): 60 min   Short Term Goals: Week 1:  PT Short Term Goal 1 (Week 1): Pt will ambulated 50 ft with min assist and LRAD PT Short Term Goal 1 - Progress (Week 1): Met PT Short Term Goal 2 (Week 1): Pt will propel w/c 150 ft with supervision  PT Short Term Goal 2 - Progress (Week 1): Met PT Short Term Goal 3 (Week 1): Pt will maintain dynamic standing balance during functional activitiy with min A and without UE support PT Short Term Goal 3 - Progress (Week 1): Met Week 2:  PT Short Term Goal 1 (Week 2): = LTGs due to ELOS  Skilled Therapeutic Interventions/Progress Updates:   Pt sitting EOB upon arrival and agreeable to therapy, no c/o pain.  Pt performed dynamic sitting balance w/ supervision and unilateral UE support while eating breakfast, verbal and tactile cues to assist w/ using fork to eat. Assisted pt w/ doffing and donning new set of clothes - Min A w/ LLE secondary to decreased vision. Ambulated to/from sink and toilet w/ unilateral HHA and encouraged pt to perform activities as independently as possible to work on navigating functional tasks w/ decreased vision and endurance.   Ambulated to/from therapy kitchen w/ RW and supervision to increase endurance. Worked on [employer] while performing kitchen tasks w/ unilateral UE support at counter. Mod A (verbal and tactile cues) to perform unilateral UE tasks while putting away dishes and boiling a pot of water secondary to decreased vision, close supervision for dynamic standing balance.   Ended session sitting EOB, call bell within reach and all needs met.   Therapy Documentation Precautions:  Precautions Precautions: Fall Precaution Comments: PEG ,  impaired central vision Restrictions Weight Bearing Restrictions: No  See Function  Navigator for Current Functional Status.   Therapy/Group: Individual Therapy  Ester Mabe K Arnette 04/19/2017, 9:53 AM

## 2017-04-20 ENCOUNTER — Inpatient Hospital Stay (HOSPITAL_COMMUNITY): Payer: 59

## 2017-04-20 DIAGNOSIS — I69398 Other sequelae of cerebral infarction: Secondary | ICD-10-CM

## 2017-04-20 DIAGNOSIS — R269 Unspecified abnormalities of gait and mobility: Secondary | ICD-10-CM

## 2017-04-20 LAB — GLUCOSE, CAPILLARY
GLUCOSE-CAPILLARY: 112 mg/dL — AB (ref 65–99)
GLUCOSE-CAPILLARY: 112 mg/dL — AB (ref 65–99)
GLUCOSE-CAPILLARY: 92 mg/dL (ref 65–99)
Glucose-Capillary: 117 mg/dL — ABNORMAL HIGH (ref 65–99)

## 2017-04-20 NOTE — Progress Notes (Signed)
[facility] PHYSICAL MEDICINE & REHABILITATION     PROGRESS NOTE  Subjective/Complaints:  Occ abd pain  ROS: Denies CP, SOB, N/V/D.  Objective: Vital Signs: Blood pressure (!) 146/70, pulse 97, temperature 98.9 F (37.2 C), temperature source Oral, resp. rate 16, height 5\' 7"  (1.702 m), weight 94.6 kg (208 lb 8.5 oz), SpO2 99 %. No results found. No results for input(s): WBC, HGB, HCT, PLT in the last 72 hours.  Recent Labs  04/18/17 0537  NA 141  K 3.3*  CL 107  GLUCOSE 127*  BUN <5*  CREATININE 1.21*  CALCIUM 9.3   CBG (last 3)   Recent Labs  04/19/17 1707 04/19/17 2056 04/20/17 0625  GLUCAP 128* 106* 112*    Wt Readings from Last 3 Encounters:  04/20/17 94.6 kg (208 lb 8.5 oz)  04/09/17 98.2 kg (216 lb 7.9 oz)  02/25/17 109 kg (240 lb 3.2 oz)    Physical Exam:  BP (!) 146/70 (BP Location: Left Arm)   Pulse 97   Temp 98.9 F (37.2 C) (Oral)   Resp 16   Ht 5\' 7"  (1.702 m)   Wt 94.6 kg (208 lb 8.5 oz)   LMP  (Within Months)   SpO2 99%   BMI 32.66 kg/m  Constitutional: She appears well-developed. Obese  HENT: Normocephalic and atraumatic.  Eyes: No discharge. EOMI. Cardiovascular: RRR. No JVD. Respiratory: Effortnormal and breath sounds normal.  GI: Soft. Bowel sounds are normal. +PEG c/d/i, mildly tender abdomen, continues to improve.   Musculoskeletal: She exhibits no edema or tenderness.  Neurological: She is alert and orientation x1 (response time improvign).  Right gaze preference.   Does not make eye contact Motor: B/l UE 4/5 proximal to distal RLE: 4/5 HF, KE, 4+/5 ADF/PF (slowly improving) LLE: 4/5 HF, KE, 4+/5 ADF/PF Skin: Skin is warm and dry. She is not diaphoretic.  Psychiatric: Her affect is blunt. Her speech is delayed. She is slowed. She does not express impulsivity. She is attentive.    Assessment/Plan: 1. Functional deficits secondary to left frontal hemorrhage s/p coiling which require 3+ hours per day of interdisciplinary  therapy in a comprehensive inpatient rehab setting. Physiatrist is providing close team supervision and 24 hour management of active medical problems listed below. Physiatrist and rehab team continue to assess barriers to discharge/monitor patient progress toward functional and medical goals.  Function:  Bathing Bathing position   Position: Shower  Bathing parts Body parts bathed by patient: Right arm, Left arm, Chest, Abdomen, Front perineal area, Right upper leg, Left upper leg, Right lower leg, Left lower leg, Buttocks Body parts bathed by helper: Back  Bathing assist Assist Level: Supervision or verbal cues      Upper Body Dressing/Undressing Upper body dressing   What is the patient wearing?: Pull over shirt/dress     Pull over shirt/dress - Perfomed by patient: Put head through opening, Pull shirt over trunk, Thread/unthread left sleeve Pull over shirt/dress - Perfomed by helper: Thread/unthread right sleeve        Upper body assist Assist Level: Supervision or verbal cues      Lower Body Dressing/Undressing Lower body dressing   What is the patient wearing?: Pants, Non-skid slipper socks     Pants- Performed by patient: Thread/unthread right pants leg, Pull pants up/down, Thread/unthread left pants leg Pants- Performed by helper: Thread/unthread left pants leg Non-skid slipper socks- Performed by patient: Don/doff right sock, Don/doff left sock Non-skid slipper socks- Performed by helper: Don/doff right sock, Don/doff left  sock                  Lower body assist Assist for lower body dressing: Supervision or verbal cues      Toileting Toileting   Toileting steps completed by patient: Adjust clothing prior to toileting, Performs perineal hygiene, Adjust clothing after toileting Toileting steps completed by helper: Adjust clothing prior to toileting, Performs perineal hygiene, Adjust clothing after toileting (per Elby Beck, NT)    Toileting assist Assist  level: Supervision or verbal cues   Transfers Chair/bed transfer   Chair/bed transfer method: Ambulatory Chair/bed transfer assist level: Supervision or verbal cues Chair/bed transfer assistive device: Medical sales representative     Max distance: 150' Assist level: Supervision or verbal cues   Wheelchair   Type: Manual Max wheelchair distance: 150 Assist Level: Supervision or verbal cues  Cognition Comprehension Comprehension assist level: Follows basic conversation/direction with no assist  Expression Expression assist level: Expresses basic needs/ideas: With extra time/assistive device  Social Interaction Social Interaction assist level: Interacts appropriately 90% of the time - Needs monitoring or encouragement for participation or interaction.  Problem Solving Problem solving assist level: Solves basic 50 - 74% of the time/requires cueing 25 - 49% of the time  Memory Memory assist level: Recognizes or recalls 50 - 74% of the time/requires cueing 25 - 49% of the time    Medical Problem List and Plan: 1.  Generalized weakness secondary to left frontal hemorrhage s/p coiling.  Cont CIR 2.  DVT Prophylaxis/Anticoagulation: Mechanical: Sequential compression devices, below knee Bilateral lower extremities  Per Neurosurg, no pharm prophyllaxis 3. H/o migraines/Pain Management: tylenol prn for HA 4. Mood: lacks insight/awareness of deficits. LCSW to follow for evaluation and support when appropriate.  5. Neuropsych: This patient is not capable of making decisions on her own behalf. 6. Skin/Wound Care: routine pressure relief  measures 7. Fluids/Electrolytes/Nutrition: Started calorie count.  PO intake remains variable 8. Prediabetes: Hgb A1c- 5.9. Will continue to monitor BS ac/hs for now   CBG (last 3) has TF  Recent Labs  04/19/17 1707 04/19/17 2056 04/20/17 0625  GLUCAP 128* 106* 112*    9. ABLA:    Hb 9.4 on 8/1 10. Electrolyte abnormality:    11.  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  12. Labile blood pressure  Cont to monitor some fluctuation , no meds   Vitals:   04/19/17 1611 04/20/17 0539  BP: 133/83 (!) 146/70  Pulse: 93 97  Resp: 18 16  Temp: 99.3 F (37.4 C) 98.9 F (37.2 C)   13. Transaminitis  Cont to monitor, improving 14. Sepsis resolved  WBCs 9.0 on 8/1  CXR reviewed, unremarkable  UA equivocal, Ucx insig growth  Blood cx pending 8/2  Cont IVF  Abd CT reviewed, showing dislodged PEG and inflammation, PEG exchanged 7/31- still sore on 8/3  Switched from Vanc/Zosyn to Keflex 14. Hypokalemia  K+ 3.4 on 7/31, 3.3 on 8.3   Will supplement daily x 3 d recheck K on Monday  LOS (Days) 11 A FACE TO FACE EVALUATION WAS PERFORMED  Novah Nessel E 04/20/2017 7:21 AM

## 2017-04-20 NOTE — Progress Notes (Signed)
Physical Therapy Session Note  Patient Details  Name: Carly [name] MRN: [id number] Date of Birth: 1971-03-26  Today's Date: 04/20/2017 PT Individual Time: 1430-1530 PT Individual Time Calculation (min): 60 min   Short Term Goals: Week 2:  PT Short Term Goal 1 (Week 2): = LTGs due to ELOS  Skilled Therapeutic Interventions/Progress Updates:     Pt supine in bed upon PT arrival, agreeable to therapy tx. Pt transferred from supine to sitting EOB with supervision. Pt ambulated x 150 ft to the gym using RW and supervision secondary to impaired vision. Pt worked on Furniture conservator/restorer with visual compensation using peripheral vision to find objects in her ambulation path, able to navigate around cones x 2 using her RW and supervision with verbal cues for visual awareness, x 2 without RW requiring min assist to maintain balance.  Pt ascended/descended 4 steps using single handrail with min assist, verbal cues for foot placement and hand placement on rail.  During seated rest break discussion with pt regarding d/c planning and setting up family training for this week.Pt states that her husband and daughter will be primarily providing supervision when she goes home. Pt transferred from w/c to recliner via ambulation with supervision. Pt left seated in recliner with call bell in reach.   Therapy Documentation Precautions:  Precautions Precautions: Fall Precaution Comments: PEG ,  impaired central vision Restrictions Weight Bearing Restrictions: No General:   Pain:  Pt denies pain this session.    See Function Navigator for Current Functional Status.   Therapy/Group: Individual Therapy  Netta Corrigan, PT, DPT 04/20/2017, 2:03 PM

## 2017-04-21 ENCOUNTER — Inpatient Hospital Stay (HOSPITAL_COMMUNITY): Payer: 59 | Admitting: Speech Pathology

## 2017-04-21 ENCOUNTER — Inpatient Hospital Stay (HOSPITAL_COMMUNITY): Payer: 59

## 2017-04-21 ENCOUNTER — Inpatient Hospital Stay (HOSPITAL_COMMUNITY): Payer: 59 | Admitting: Occupational Therapy

## 2017-04-21 LAB — BASIC METABOLIC PANEL
ANION GAP: 12 (ref 5–15)
BUN: 6 mg/dL (ref 6–20)
CHLORIDE: 102 mmol/L (ref 101–111)
CO2: 30 mmol/L (ref 22–32)
CREATININE: 1.32 mg/dL — AB (ref 0.44–1.00)
Calcium: 9.6 mg/dL (ref 8.9–10.3)
GFR calc Af Amer: 55 mL/min — ABNORMAL LOW (ref >60)
GFR calc non Af Amer: 47 mL/min — ABNORMAL LOW (ref >60)
GLUCOSE: 107 mg/dL — AB (ref 65–99)
POTASSIUM: 3.6 mmol/L (ref 3.5–5.1)
Sodium: 144 mmol/L (ref 135–145)

## 2017-04-21 LAB — GLUCOSE, CAPILLARY
GLUCOSE-CAPILLARY: 103 mg/dL — AB (ref 65–99)
GLUCOSE-CAPILLARY: 94 mg/dL (ref 65–99)
Glucose-Capillary: 92 mg/dL (ref 65–99)
Glucose-Capillary: 100 mg/dL — ABNORMAL HIGH (ref 65–99)

## 2017-04-21 NOTE — Progress Notes (Addendum)
Social Work Patient ID: Carly [name], female   DOB: [birth year], 46 y.o.   MRN: [id number]   Met with pt, sister and husband regarding equipment, wants to get tub bench aware it is private pay.  Have made referral for home health-[facility] and needed equipment. Family here for education today. Prepare for discharge Wed. Met with husband to answer his questions, he is aware pt will require 24 hr supervision due to vision issues and for her safety. He asked about if Medicaid covered any services, she does not have this yet. Husband has applied for disability and is awaiting word from them. He will apply for medicaid also. He has been through therapies with pt. He also spoke with Pam-PA regarding his medical questions. Work toward discharge for [employer].

## 2017-04-21 NOTE — Plan of Care (Signed)
Problem: RH SAFETY Goal: RH STG DECREASED RISK OF FALL WITH ASSISTANCE STG Decreased Risk of Fall With supervison/cues Assistance.   Outcome: Progressing Continue to have increase visual deficit

## 2017-04-21 NOTE — Progress Notes (Signed)
Occupational Therapy Session Note  Patient Details  Name: GLADIES SOFRANKO MRN: [id number] Date of Birth: April 22, 1971  Today's Date: 04/21/2017 OT Individual Time: 1102-1200 OT Individual Time Calculation (min): 58 min    Short Term Goals: Week 2:  OT Short Term Goal 1 (Week 2): Continue working on established LTGs set at supervision level overall.  Skilled Therapeutic Interventions/Progress Updates:    Pt completed bathing and dressing during session.  Supervision for ambulation to the walk-in shower with use of the RW for support.  Once sitting she was able to remove all clothing prior to shower.  Bathing completed sit to stand with min instructional cueing to sequence and for thoroughness.  She continues to demonstrate decreased organization, continuing to wash her upper legs and peri area repeatedly, without sequencing to the next part.  She was able to dry off and then transfer to the bedside recliner for dressing with supervision.  Min assist for fastening bra secondary to not being able to donn it backwards with the PEG tube in place and then spin it around to the front.  She was able to retrieve clothing from chair beside of her and orient and donn without needing cueing.  Finished session with pt standing at the sink to complete oral hygiene with supervision and min instructional cueing for locating items needed on the sink.    Therapy Documentation Precautions:  Precautions Precautions: Fall Precaution Comments: PEG ,  impaired central vision Restrictions Weight Bearing Restrictions: No  Pain: Pain Assessment Pain Assessment: 0-10 Pain Score: 4  Pain Type: Acute pain Pain Location: Head Pain Orientation: Right Pain Descriptors / Indicators: Discomfort Pain Onset: On-going Pain Intervention(s): Repositioned ADL: See Function Navigator for Current Functional Status.   Therapy/Group: Individual Therapy  Hyrum Shaneyfelt OTR/L 04/21/2017, 12:07 PM

## 2017-04-21 NOTE — Progress Notes (Addendum)
Speech Language Pathology Daily Session Note  Patient Details  Name: Carly [name] MRN: [id number] Date of Birth: 1971-02-25  Today's Date: 04/21/2017   Treatment session #1 SLP Individual Time: 1638-4665 SLP Individual Time Calculation (min): 45 min   Treatment session #2 SLP Individual Time: 9935-7017 SLP Individual Time Calculation (min): 16 minutes  Short Term Goals: Week 2: SLP Short Term Goal 1 (Week 2): Pt will utilize external memory aids to recall new daily information with supervision cues.  SLP Short Term Goal 2 (Week 2): Pt will complete basic, familiar tasks within ADLs activities with Min A cues.  SLP Short Term Goal 3 (Week 2): Pt will utilize increased vocal intensity with supervision cues to achieve ~ 75% intelligilbity at the sentenc level. SLP Short Term Goal 4 (Week 2): Pt will locate objects in all fields of vision in 50% of opportunties with Mod A cues.   Skilled Therapeutic Interventions:   Skilled treatment session #1  focused on cognition goals. SLP facilitated session by providing Min A for task initiation to complete basic self-feeding task. Pt able to identify/find objects in center field of vision with Min A to supervision verbal cues. Pt left upright in recliner with all needs within reach. Continue per current plan of care.   Skilled treatment session #2 focused on cognition goals. SLP facilitated session by completing education with multiple family members on cognitive impairments that are complicated by visual deficits. Pt requires 24 hour supervision and family states they are prepared. All questions answered to pt satisfaction and any questions not able to be answered within scope of SLP, passed along to CSW. Pt left in care of family.     Function:    Cognition Comprehension Comprehension assist level: Understands basic 75 - 89% of the time/ requires cueing 10 - 24% of the time  Expression   Expression assist level: Expresses basic 90% of the  time/requires cueing < 10% of the time.  Social Interaction Social Interaction assist level: Interacts appropriately 90% of the time - Needs monitoring or encouragement for participation or interaction.  Problem Solving Problem solving assist level: Solves basic 75 - 89% of the time/requires cueing 10 - 24% of the time  Memory Memory assist level: Recognizes or recalls 75 - 89% of the time/requires cueing 10 - 24% of the time    Pain Pain Assessment Pain Assessment: 0-10 Pain Score: 4  Pain Type: Acute pain Pain Location: Head Pain Orientation: Right Pain Descriptors / Indicators: Discomfort Pain Onset: On-going Pain Intervention(s): Repositioned  Therapy/Group: Individual Therapy  Sherre Wooton 04/21/2017, 2:56 PM

## 2017-04-21 NOTE — Progress Notes (Addendum)
Physical Therapy Session Note  Patient Details  Name: Carly [name] MRN: [id number] Date of Birth: November 17, 1970  Today's Date: 04/21/2017 PT Individual Time: 0800-0900 AND 1545-1615 PT Individual Time Calculation (min): 60 min , 30 min  Short Term Goals: Week 2:  PT Short Term Goal 1 (Week 2): = LTGs due to ELOS  Skilled Therapeutic Interventions/Progress Updates:    Session 1: Pt supine in bed upon PT arrival, agreeable to therapy tx. Pt transferred from supine to sitting EOB with supervision. Pt sitting EOB working on dynamic sitting balance to finish eating breakfast. Pt ambulated x 15 ft to bathroom with supervision, working on standing dynamic balance to don/doff pants and clean peri area, sit to stands x 2 from toilet with supervision and RW. Pt ambulated x 15 ft to the sink to wash hands, standing balance without UE support to wash hands with supervision. Pt ambulated x 150 ft to the gym with supervision using RW. Pt worked on standing balance without UE support while standing on foam 2 x 20 sec, while standing on foam to match cards 2 x 1 min. Pt worked on sitting dynamic balance without UE support to throw/catch ball x 2 trials and in standing to throw/catch ball x 2 trials. Pt able to use peripheral vision to compensate for central visual deficits and decreased acuity throughout session. Pt transferred from w/c to recliner stand step with RW and supervision. Pt left seated in recliner with needs in reach.  Pt denies pain this session.   Session 2: Pt supine in bed upon PT arrival, agreeable to therapy and denies pain this session. Pt transfers from supine to sitting EOB with supervision. [name] present for session and training. Pt ambulated to the gym x 150 ft using RW and supervision, verbal cues given to compensate for visual deficits. Pt ascended/descended 4 steps using single handrail with min assist, pt steps sideways with both hands on rail for UE support, step  to pattern, visual cues needed for foot placement secondary to visual deficits. Pt transferred from w/c to toilet stand step transfer, working on standing balance to don/doff pants, clean peri area and wash hands. Pt left seated in w/c at end of session with needs in reach and family present.   Therapy Documentation Precautions:  Precautions Precautions: Fall Precaution Comments: PEG ,  impaired central vision Restrictions Weight Bearing Restrictions: No    See Function Navigator for Current Functional Status.   Therapy/Group: Individual Therapy  Netta Corrigan, PT, DPT 04/21/2017, 8:17 AM

## 2017-04-21 NOTE — Progress Notes (Signed)
[facility] PHYSICAL MEDICINE & REHABILITATION     PROGRESS NOTE  Subjective/Complaints:  Pt seen laying in bed this AM.  She slept fairly overnight.  She notes occasional headaches.   ROS: +Occasional headaches. Denies CP, SOB, N/V/D.  Objective: Vital Signs: Blood pressure (!) 143/69, pulse 93, temperature 98.9 F (37.2 C), temperature source Oral, resp. rate 18, height 5\' 7"  (1.702 m), weight 94.8 kg (208 lb 15.3 oz), SpO2 100 %. No results found. No results for input(s): WBC, HGB, HCT, PLT in the last 72 hours.  Recent Labs  04/21/17 0402  NA 144  K 3.6  CL 102  GLUCOSE 107*  BUN 6  CREATININE 1.32*  CALCIUM 9.6   CBG (last 3)   Recent Labs  04/20/17 1652 04/20/17 2045 04/21/17 0620  GLUCAP 92 117* 103*    Wt Readings from Last 3 Encounters:  04/21/17 94.8 kg (208 lb 15.3 oz)  04/09/17 98.2 kg (216 lb 7.9 oz)  02/25/17 109 kg (240 lb 3.2 oz)    Physical Exam:  BP (!) 143/69 (BP Location: Right Arm)   Pulse 93   Temp 98.9 F (37.2 C) (Oral)   Resp 18   Ht 5\' 7"  (1.702 m)   Wt 94.8 kg (208 lb 15.3 oz)   LMP  (Within Months)   SpO2 100%   BMI 32.73 kg/m  Constitutional: She appears well-developed. Obese  HENT: Normocephalic and atraumatic.  Eyes: No discharge. EOMI. Cardiovascular: RRR. No JVD. Respiratory: Effort normal and breath sounds normal.  GI: Soft. Bowel sounds are normal. +PEG c/d/i.   Musculoskeletal: She exhibits no edema or tenderness.  Neurological: She is alert and orientation (response time improving).  Right gaze preference.   Does not make eye contact Motor: B/l UE 4/5 proximal to distal RLE: 4/5 HF, KE, 4+/5 ADF/PF (stable) LLE: 4/5 HF, KE, 4+/5 ADF/PF Skin: Skin is warm and dry. She is not diaphoretic.  Psychiatric: Her affect is blunt. Her speech is delayed. She is slowed. She does not express impulsivity. She is attentive.    Assessment/Plan: 1. Functional deficits secondary to left frontal hemorrhage s/p coiling which  require 3+ hours per day of interdisciplinary therapy in a comprehensive inpatient rehab setting. Physiatrist is providing close team supervision and 24 hour management of active medical problems listed below. Physiatrist and rehab team continue to assess barriers to discharge/monitor patient progress toward functional and medical goals.  Function:  Bathing Bathing position   Position: Shower  Bathing parts Body parts bathed by patient: Right arm, Left arm, Chest, Abdomen, Front perineal area, Right upper leg, Left upper leg, Right lower leg, Left lower leg, Buttocks Body parts bathed by helper: Back  Bathing assist Assist Level: Supervision or verbal cues      Upper Body Dressing/Undressing Upper body dressing   What is the patient wearing?: Pull over shirt/dress     Pull over shirt/dress - Perfomed by patient: Put head through opening, Pull shirt over trunk, Thread/unthread left sleeve Pull over shirt/dress - Perfomed by helper: Thread/unthread right sleeve        Upper body assist Assist Level: Supervision or verbal cues      Lower Body Dressing/Undressing Lower body dressing   What is the patient wearing?: Pants, Non-skid slipper socks     Pants- Performed by patient: Thread/unthread right pants leg, Pull pants up/down, Thread/unthread left pants leg Pants- Performed by helper: Thread/unthread left pants leg Non-skid slipper socks- Performed by patient: Don/doff right sock, Don/doff left sock Non-skid  slipper socks- Performed by helper: Don/doff right sock, Don/doff left sock                  Lower body assist Assist for lower body dressing: Supervision or verbal cues      Toileting Toileting   Toileting steps completed by patient: Performs perineal hygiene Toileting steps completed by helper: Adjust clothing prior to toileting    Toileting assist Assist level: Touching or steadying assistance (Pt.75%)   Transfers Chair/bed transfer   Chair/bed transfer  method: Ambulatory Chair/bed transfer assist level: Supervision or verbal cues Chair/bed transfer assistive device: Medical sales representative     Max distance: 150' Assist level: Supervision or verbal cues   Wheelchair   Type: Manual Max wheelchair distance: 150 Assist Level: Supervision or verbal cues  Cognition Comprehension Comprehension assist level: Understands basic 75 - 89% of the time/ requires cueing 10 - 24% of the time  Expression Expression assist level: Expresses basic 50 - 74% of the time/requires cueing 25 - 49% of the time. Needs to repeat parts of sentences.  Social Interaction Social Interaction assist level: Interacts appropriately 75 - 89% of the time - Needs redirection for appropriate language or to initiate interaction.  Problem Solving Problem solving assist level: Solves basic 50 - 74% of the time/requires cueing 25 - 49% of the time  Memory Memory assist level: Recognizes or recalls 50 - 74% of the time/requires cueing 25 - 49% of the time    Medical Problem List and Plan: 1.  Generalized weakness secondary to left frontal hemorrhage s/p coiling.  Cont CIR 2.  DVT Prophylaxis/Anticoagulation: Mechanical: Sequential compression devices, below knee Bilateral lower extremities  Per Neurosurg, no pharm prophyllaxis 3. H/o migraines/Pain Management: tylenol prn for HA 4. Mood: lacks insight/awareness of deficits. LCSW to follow for evaluation and support when appropriate.  5. Neuropsych: This patient is not capable of making decisions on her own behalf. 6. Skin/Wound Care: routine pressure relief  measures 7. Fluids/Electrolytes/Nutrition: Started calorie count.  8. Prediabetes: Hgb A1c- 5.9. Will continue to monitor BS ac/hs for now   CBG (last 3)   Recent Labs  04/20/17 1652 04/20/17 2045 04/21/17 0620  GLUCAP 92 117* 103*   Controlled 8/6  9. ABLA:    Hb 9.4 on 8/1 10. 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  12. Labile blood pressure  Cont to monitor, overall controlled 8/6   Vitals:   04/20/17 1433 04/21/17 0525  BP: 114/70 (!) 143/69  Pulse: 96 93  Resp: 18 18  Temp: (!) 97.5 F (36.4 C) 98.9 F (37.2 C)   13. Transaminitis  Cont to monitor, improving 14. Sepsis resolved  WBCs 9.0 on 8/1  CXR reviewed, unremarkable  UA equivocal, Ucx insig growth  Blood cx pending 8/2  Cont IVF  Abd CT reviewed, showing dislodged PEG and inflammation, PEG exchanged 7/31- still sore on 8/3  Switched from Vanc/Zosyn to Keflex 14. Hypokalemia  K+ 3.6 on 8/6  Supplemented  Cont to monitor 15. AKI  Cr 1.32 on 8/6  Encourage fluids, now off IV abx  Cont to monitor  LOS (Days) 12 A FACE TO FACE EVALUATION WAS PERFORMED  Carly [name] Lorie Phenix 04/21/2017 8:17 AM

## 2017-04-22 ENCOUNTER — Inpatient Hospital Stay (HOSPITAL_COMMUNITY): Payer: 59 | Admitting: Occupational Therapy

## 2017-04-22 ENCOUNTER — Inpatient Hospital Stay (HOSPITAL_COMMUNITY): Payer: 59

## 2017-04-22 ENCOUNTER — Inpatient Hospital Stay (HOSPITAL_COMMUNITY): Payer: 59 | Admitting: Physical Therapy

## 2017-04-22 DIAGNOSIS — Z431 Encounter for attention to gastrostomy: Secondary | ICD-10-CM

## 2017-04-22 DIAGNOSIS — N179 Acute kidney failure, unspecified: Secondary | ICD-10-CM

## 2017-04-22 LAB — GLUCOSE, CAPILLARY
GLUCOSE-CAPILLARY: 110 mg/dL — AB (ref 65–99)
GLUCOSE-CAPILLARY: 112 mg/dL — AB (ref 65–99)
Glucose-Capillary: 103 mg/dL — ABNORMAL HIGH (ref 65–99)
Glucose-Capillary: 108 mg/dL — ABNORMAL HIGH (ref 65–99)

## 2017-04-22 NOTE — Progress Notes (Signed)
Physical Therapy Discharge Summary  Patient Details  Name: Carly [name] MRN: [id number] Date of Birth: 05/23/1971  Today's Date: 04/22/2017 PT Individual Time: 1015-1100 PT Individual Time Calculation (min): 45 min   Pt in recliner upon arrival and agreeable to therapy, no c/o pain. Husband present throughout session. Pt performed functional mobility as detailed below and worked on activity tolerance and endurance w/ functional activities. Pt performs all transfers and ambulation at a supervision level w/ verbal cues for direction and environmental awareness secondary to impaired vision. Ended session in care of husband in recliner, all needs met.   Patient has met 6 of 6 long term goals due to improved activity tolerance, improved balance, improved postural control, increased strength, ability to compensate for deficits, improved attention and improved awareness.  Patient to discharge at an ambulatory level Supervision.   Patient's care partner is independent to provide the necessary physical and cognitive assistance at discharge.  Reasons goals not met: n/a  Recommendation:  Patient will benefit from ongoing skilled PT services in home health setting to continue to advance safe functional mobility, address ongoing impairments in endurance, LE strength, balance, functional mobility, and minimize fall risk.  Equipment: w/c and RW  Reasons for discharge: treatment goals met and discharge from hospital  Patient/family agrees with progress made and goals achieved: Yes  PT Discharge Precautions/Restrictions Precautions Precautions: Fall Precaution Comments: PEG ,  impaired central vision Restrictions Weight Bearing Restrictions: No Pain Pain Assessment Pain Assessment: No/denies pain Vision/Perception  Vision - Assessment Eye Alignment: Impaired (comment) Ocular Range of Motion: Restricted looking up Alignment/Gaze Preference: Gaze left Tracking/Visual Pursuits: Decreased  smoothness of eye movement to RIGHT superior field;Decreased smoothness of eye movement to LEFT inferior field;Decreased smoothness of eye movement to LEFT superior field;Decreased smoothness of eye movement to RIGHT inferior field Saccades: Decreased speed of saccadic movement Convergence: Within functional limits Additional Comments: Decreased visual acuity w/ objects in line of vision, uses peripheral vision to locate items as needed. Decreased peripheral vision field in all directions, loses objects in visual field early when moved to L/R/up/down.  Perception Perception: Within Functional Limits Praxis Praxis: Intact  Cognition Arousal/Alertness: Awake/alert Orientation Level: Oriented X4 Attention: Sustained;Selective Sustained Attention: Appears intact Sustained Attention Impairment: Verbal basic Selective Attention: Appears intact Selective Attention Impairment: Verbal basic Memory: Impaired Memory Impairment: Decreased recall of new information Awareness: Impaired Awareness Impairment: Other (comment) (Impaired secondary to vision) Problem Solving Impairment: Functional basic Safety/Judgment: Impaired (Impaired secondary to vision) Sensation Sensation Light Touch: Appears Intact Proprioception: Appears Intact Coordination Gross Motor Movements are Fluid and Coordinated: Yes Fine Motor Movements are Fluid and Coordinated: Yes Motor  Motor Motor: Within Functional Limits Motor - Skilled Clinical Observations: generalized weakness Motor - Discharge Observations: generalized weakness L>R  Mobility Bed Mobility Bed Mobility: Rolling Right;Rolling Left Rolling Right: 6: Modified independent (Device/Increase time) Rolling Left: 6: Modified independent (Device/Increase time) Transfers Transfers: Yes Sit to Stand: 5: Supervision Stand to Sit: 5: Supervision Locomotion  Ambulation Ambulation: Yes Ambulation/Gait Assistance: 5: Supervision Ambulation Distance (Feet): 200  Feet Assistive device: Rolling walker Gait Gait: Yes Gait Pattern: Step-to pattern;Wide base of support;Trunk flexed;Poor foot clearance - left;Poor foot clearance - right Gait velocity: decreased Stairs / Additional Locomotion Stairs: Yes Stairs Assistance: 4: Min guard Stair Management Technique: One rail Left Number of Stairs: 8 Height of Stairs: 6 Wheelchair Mobility Wheelchair Mobility: Yes Wheelchair Assistance: 5: Careers information officer: Both upper extremities;Both lower extermities Wheelchair Parts Management: Needs assistance Distance: 150  Trunk/Postural Assessment  Cervical Assessment Cervical Assessment: Within Functional Limits Thoracic Assessment Thoracic Assessment: Within Functional Limits Lumbar Assessment Lumbar Assessment: Within Functional Limits Postural Control Postural Control: Within Functional Limits  Balance Balance Balance Assessed: Yes Static Sitting Balance Static Sitting - Balance Support: No upper extremity supported Static Sitting - Level of Assistance: 5: Stand by assistance Dynamic Sitting Balance Dynamic Sitting - Balance Support: No upper extremity supported Dynamic Sitting - Level of Assistance: 5: Stand by assistance Static Standing Balance Static Standing - Balance Support: Bilateral upper extremity supported Static Standing - Level of Assistance: 5: Stand by assistance Dynamic Standing Balance Dynamic Standing - Balance Support: Bilateral upper extremity supported Dynamic Standing - Level of Assistance: 5: Stand by assistance Extremity Assessment  RUE Assessment RUE Assessment: Not tested LUE Assessment LUE Assessment: Not tested RLE Assessment RLE Assessment: Exceptions to [facility] (4 to 4+/5 globally) LLE Assessment LLE Assessment: Exceptions to [facility] (4/5 hip and knee musculature, 4+/5 ankle musculature)   See Function Navigator for Current Functional Status.  Reynard Christoffersen K Arnette 04/22/2017, 12:56 PM

## 2017-04-22 NOTE — Progress Notes (Signed)
Speech Language Pathology Discharge Summary  Patient Details  Name: Carly [name] MRN: [id number] Date of Birth: 10-26-1970  Today's Date: 04/22/2017 SLP Individual Time: 1415-1500 SLP Individual Time Calculation (min): 45 min   Skilled Therapeutic Interventions: Skilled ST services focused on cognitive goals and discharge education. SLP facilitated identification of objects within central visual field with Min-Supervision verbal cues. SLP faciliitated awareness of deficits in participation of preferred leisure activities and compensatory strategies for increased safety and independence. SLP reviewed progress with pt and discharge recommendations. Pt was left in room in bed with call bell within reach.     Patient has met 4 of 6 long term goals.  Patient to discharge at [facility] level.  Reasons goals not met: due to visual deficits   Clinical Impression/Discharge Summary:   Pt demonstrated ability to met 4 out of 6 cognitive goals. Pt demonstrated Min A cues compared to set goals for Supervision cues for recall and problem solving due to overall visual field deficits, however is adequate for discharge due to 24 hour support at home. SLP facilitated skills in attention, awareness, vocal intensity, recall of new information, problem solving, utilizing visual fields to identity objects with Min- Supervision cues, in order to increase functional independence and reduce burden of care. Recommend 24 hour supervision, set up/assiatnce with meals due to visual deficits and outpatient SLP therapy.   Care Partner:  Caregiver Able to Provide Assistance: Yes  Type of Caregiver Assistance: Physical;Cognitive  Recommendation:  Home Health SLP;Outpatient SLP;24 hour supervision/assistance  Rationale for SLP Follow Up: Maximize cognitive function and independence;Reduce caregiver burden   Equipment:     Reasons for discharge: Discharged from hospital   Patient/Family Agrees with Progress Made and  Goals Achieved: Yes   Function:  Eating Eating   Modified Consistency Diet: No Eating Assist Level: Swallowing techniques: self managed;Supervision or verbal cues     Helper Scoops Food on Utensil: Occasionally     Cognition Comprehension Comprehension assist level: Understands basic 90% of the time/cues < 10% of the time  Expression   Expression assist level: Expresses basic 90% of the time/requires cueing < 10% of the time.  Social Interaction Social Interaction assist level: Interacts appropriately 90% of the time - Needs monitoring or encouragement for participation or interaction.  Problem Solving Problem solving assist level: Solves basic 90% of the time/requires cueing < 10% of the time  Memory Memory assist level: Recognizes or recalls 90% of the time/requires cueing < 10% of the time   Kirston Luty  [facility] 04/22/2017, 5:21 PM

## 2017-04-22 NOTE — Progress Notes (Signed)
Occupational Therapy Discharge Summary  Patient Details  Name: Carly [name] MRN: [id number] Date of Birth: [birth year]  Today's Date: 04/22/2017 OT Individual Time: 6967-8938 OT Individual Time Calculation (min): 28 min   Session Note:  Pt worked on dynamic standing balance without use of an assistive device.  Min guard assist for mobility to the ADL apartment and back without use of an assistive device.   Educated pt's spouse on current ADL level once returned to room as well as the amount of supervision needed for bathing, dressing tasks.  Provided handouts and demonstration to husband as well for UE exercises and therapy putty exercises issued earlier this am.  Pt left in recliner chair at end of session with spouse present.   Patient has met 9 of 11 long term goals due to improved balance, ability to compensate for deficits and improved awareness.  Patient to discharge at overall Supervision level.  Patient's care partner is independent to provide the necessary physical and cognitive assistance at discharge.    Reasons goals not met: Pt still needs min assist for fastening bra.   Recommendation:  Patient will benefit from ongoing skilled OT services in home health setting to continue to advance functional skills in the area of BADL and Reduce care partner burden.  Pt still demonstrates significant visual acuity deficits which limit selfcare tasks at this time.  She needs supervision for safety, and I recommend continued OT to help with vision compensation techniques in familiar environment.   Equipment: tub bench, 3:1  Reasons for discharge: treatment goals met and discharge from hospital  Patient/family agrees with progress made and goals achieved: Yes  OT Discharge Precautions/Restrictions  Precautions Precautions: Fall Precaution Comments: PEG ,  impaired central vision Restrictions Weight Bearing Restrictions: No  Pain Pain Assessment Pain Assessment: No/denies pain ADL  See Function Section of chart for details  Vision Baseline Vision/History: No visual deficits Patient Visual Report: Blurring of vision Vision Assessment?: Yes Eye Alignment: Impaired (comment) (left eye turns slight medially) Ocular Range of Motion: Restricted looking up Alignment/Gaze Preference: Gaze left (right eye medially positioned when pt is staring at midline) Tracking/Visual Pursuits: Decreased smoothness of horizontal tracking;Decreased smoothness of vertical tracking Convergence: Impaired (comment) Additional Comments: Pt with intact peripheral vision which she continues to rely on for function and balance mostly.  She was able to read a few words of small print if she turns her head to the left to compensate.  Still with severe visual acuity issues overall.   Perception  Perception: Within Functional Limits Praxis Praxis: Intact Cognition Overall Cognitive Status: Impaired/Different from baseline Arousal/Alertness: Awake/alert Orientation Level: Oriented X4 Attention: Sustained;Selective Sustained Attention: Appears intact Selective Attention: Impaired Memory: Impaired Memory Impairment: Storage deficit;Decreased recall of new information Awareness: Appears intact Problem Solving: Impaired Problem Solving Impairment: Functional basic;Functional complex Comments: Pt still with decreased memory with all tasks.  She needs cueing to sequence through bathing tasks as she cannot remember what areas she has already washed.  Sensation Sensation Light Touch: Appears Intact Stereognosis: Appears Intact Hot/Cold: Appears Intact Proprioception: Appears Intact Motor  Motor Motor: Within Functional Limits Motor - Discharge Observations: generalized weakness Mobility  Bed Mobility Bed Mobility: Rolling Right;Rolling Left Rolling Right: 6: Modified independent (Device/Increase time) Rolling Left: 6: Modified independent (Device/Increase time) Transfers Transfers: Sit to  Stand;Stand to Sit Sit to Stand: 5: Supervision;With upper extremity assist;With armrests Stand to Sit: 5: Supervision;With upper extremity assist;With armrests  Trunk/Postural Assessment  Cervical Assessment Cervical Assessment: Within Functional Limits  Thoracic Assessment Thoracic Assessment: Within Functional Limits Lumbar Assessment Lumbar Assessment: Within Functional Limits Postural Control Postural Control: Within Functional Limits  Balance Balance Balance Assessed: Yes Static Sitting Balance Static Sitting - Balance Support: No upper extremity supported Static Sitting - Level of Assistance: 6: Modified independent (Device/Increase time) Dynamic Sitting Balance Dynamic Sitting - Balance Support: No upper extremity supported Dynamic Sitting - Level of Assistance: 5: Stand by assistance Static Standing Balance Static Standing - Balance Support: Bilateral upper extremity supported Static Standing - Level of Assistance: 5: Stand by assistance Dynamic Standing Balance Dynamic Standing - Balance Support: During functional activity;Bilateral upper extremity supported Dynamic Standing - Level of Assistance: 5: Stand by assistance Extremity/Trunk Assessment RUE Assessment RUE Assessment: Within Functional Limits LUE Assessment LUE Assessment: Within Functional Limits   See Function Navigator for Current Functional Status.  Archita Lomeli OTR/L 04/22/2017, 4:39 PM

## 2017-04-22 NOTE — Plan of Care (Signed)
Problem: RH Vision Goal: RH LTG Vision (Specify) Outcome: Not Met (add Reason) Needs mod instructional cueing to locate grooming tasks.

## 2017-04-22 NOTE — Plan of Care (Signed)
Problem: RH Dressing Goal: LTG Patient will perform upper body dressing (OT) LTG Patient will perform upper body dressing with assist, with/without cues (OT).  Outcome: Not Met (add Reason) Needs min assist for fastening bra.    

## 2017-04-22 NOTE — Consult Note (Signed)
Neuropsychological Consultation   Patient:   Carly [name]   DOB:   1971-02-23  MR Number:  [id number]  Location:  [place] [address] [facility] [address] [id number] [place] [address] Dept: [place]: [phone]           Date of Service:   04/22/2017  Start Time:   1 PM End Time:   155 PM  Provider/Observer:  Ilean Skill, Psy.D.       Clinical Neuropsychologist       Billing Code/Service: [phone] 4 Units  Chief Complaint:    Carly [name] is completing the Comprehensive Inpatient Rehabilitation program and a neuropsychological consult was requested due to ongoing issue with cognitive functioning and issues with adjustment including anxiety, particularly around pending discharge home after lengthy hospital stay.  Patient continues to have issues with visual deficit, slowed mental processing speed, attention/concentration issues and deficits in mobility.  However, she has continued to have significant improvements in these areas.    Reason for Service:  Carly [name] is a 46 year old female that developed severe HA and focal seizure with progressive LOC.  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  Extensive hospital stay.  Below is the HPI for the current admission.  HPI: Carly [name] a 46 y.o.right femalewith history of HTN, GERD, migraines who was admitted on 03/03/17 with HA and focal seizure activity and developed progressive decrease in LOC requiring intubation in ED. History taken from chart review and family.  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 Intraventricular catheter placed by Dr. Cyndy Freeze and she underwent cerebral angio with coiling of L-ICA aneurysm by Dr. Kathyrn Sheriff. Hospital course significant for blood  drainage from IVC, multiple episodes of self extubation, HCAP, hypotension, lack of responsiveness as well as signs of minimal neurologic improvement. She was made DNR and family elected extubation with monitoring. EVD clamped with follow up Cranial CT, reviewed, showing increase in hydrocephalus.  Per report, no change in MS therefore discontinued 7/14. Mentation improving and PEG placed for nutritional support by Dr. Hulen Skains on 7/17. Hospital course complicated by tachypnea, tachycardia, ABLA.  As lethargy resolved, she was started on regular diet yesterday. Po intake poor and requiring assistance with meals. she continues to have diffuse weakness with delayed processing, significant visual deficits and significant deficits in mobility. CIR recommended by rehab team.   Current Status:  The patient is reporting some increase in anxiety due to impending discharge tomorrow.  Behavioral Observation: Carly [name]  presents as a 46 y.o.-year-old Right African American Female who appeared her stated age. her dress was Appropriate and she was Well Groomed and her manners were Appropriate to the situation.  her participation was indicative of Appropriate and Inattentive behaviors.  There were physical disabilities noted.  she displayed an appropriate level of cooperation and motivation.     Interactions:    Active Appropriate and Inattentive  Attention:   abnormal and attention span appeared shorter than expected for age  Memory:   abnormal; remote memory intact, recent memory impaired  Visuo-spatial:  within normal limits  Speech (Volume):  normal  Speech:   normal; slowed pace  Thought Process:  Coherent, Relevant and with some repitition to content.  Though Content:  WNL;   Orientation:   person, place, time/date and situation  Judgment:  Fair  Planning:   Fair  Affect:    Anxious  Mood:    Anxious  Insight:   Lacking.  The patient is having some difficulty gaining grasp of what has  happened.  Intelligence:   normal  Medical History:   Past Medical History:  Diagnosis Date  . GERD (gastroesophageal reflux disease)   . Hypertension   . Migraines             Abuse/Trauma History: The patient had SDH on 03/03/2017  Family Med/Psych History:  Family History  Problem Relation Age of Onset  . Diabetes Mother   . Hypertension Mother   . Hyperlipidemia Mother   . Cancer Father        colon cancer  . Diabetes Maternal Grandmother   . Hyperlipidemia Paternal Grandfather     Risk of Suicide/Violence: virtually non-existent   Impression/DX:  Carly [name] is completing the Comprehensive Inpatient Rehabilitation program and a neuropsychological consult was requested due to ongoing issue with cognitive functioning and issues with adjustment including anxiety, particularly around pending discharge home after lengthy hospital stay.  Patient continues to have issues with visual deficit, slowed mental processing speed, attention/concentration issues and deficits in mobility.  However, she has continued to have significant improvements in these areas.  The patient is progressing and has improved to the point that she is being discharged home.  I would expect her to continue to improve both cognitively and psychomotor functioning.  Diagnosis:    CVA, Adjustment Disorder        Electronically Signed   _______________________ Ilean Skill, Psy.D.

## 2017-04-22 NOTE — Progress Notes (Signed)
[place] PHYSICAL MEDICINE & REHABILITATION     PROGRESS NOTE  Subjective/Complaints:  Pt seen layingi n bed this AM.  She states her night was "up and down".  She notes improvement in headaches and strength.   ROS: +Occasional headaches. Denies CP, SOB, N/V/D.  Objective: Vital Signs: Blood pressure 136/84, pulse 93, temperature 98.8 F (37.1 C), temperature source Oral, resp. rate 18, height 5\' 7"  (1.702 m), weight 94.6 kg (208 lb 8.9 oz), SpO2 95 %. No results found. No results for input(s): WBC, HGB, HCT, PLT in the last 72 hours.  Recent Labs  04/21/17 0402  NA 144  K 3.6  CL 102  GLUCOSE 107*  BUN 6  CREATININE 1.32*  CALCIUM 9.6   CBG (last 3)   Recent Labs  04/21/17 1645 04/21/17 2137 04/22/17 0639  GLUCAP 92 100* 103*    Wt Readings from Last 3 Encounters:  04/22/17 94.6 kg (208 lb 8.9 oz)  04/09/17 98.2 kg (216 lb 7.9 oz)  02/25/17 109 kg (240 lb 3.2 oz)    Physical Exam:  BP 136/84 (BP Location: Left Arm)   Pulse 93   Temp 98.8 F (37.1 C) (Oral)   Resp 18   Ht 5\' 7"  (1.702 m)   Wt 94.6 kg (208 lb 8.9 oz)   LMP  (Within Months)   SpO2 95%   BMI 32.66 kg/m  Constitutional: She appears well-developed. Obese  HENT: Normocephalic and atraumatic.  Eyes: No discharge. EOMI. Cardiovascular: RRR. No JVD. Respiratory: Effort normal and breath sounds normal.  GI: Soft. Bowel sounds are normal. +PEG.   Musculoskeletal: She exhibits no edema or tenderness.  Neurological: She is alert (response time improving).  Right gaze preference.   Does not make eye contact Motor: B/l UE 4/5 proximal to distal RLE: 4/5 HF, KE, 4+/5 ADF/PF (improving) LLE: 4/5 HF, KE, 4+/5 ADF/PF (improving) Skin: Skin is warm and dry. She is not diaphoretic.  Psychiatric: Her affect is blunt. Her speech is delayed. She is slowed. She does not express impulsivity. She is attentive.    Assessment/Plan: 1. Functional deficits secondary to left frontal hemorrhage s/p coiling  which require 3+ hours per day of interdisciplinary therapy in a comprehensive inpatient rehab setting. Physiatrist is providing close team supervision and 24 hour management of active medical problems listed below. Physiatrist and rehab team continue to assess barriers to discharge/monitor patient progress toward functional and medical goals.  Function:  Bathing Bathing position   Position: Shower  Bathing parts Body parts bathed by patient: Right arm, Left arm, Chest, Abdomen, Front perineal area, Right upper leg, Left upper leg, Right lower leg, Left lower leg, Buttocks Body parts bathed by helper: Back  Bathing assist Assist Level: Supervision or verbal cues      Upper Body Dressing/Undressing Upper body dressing   What is the patient wearing?: Pull over shirt/dress, Bra Bra - Perfomed by patient: Thread/unthread right bra strap, Thread/unthread left bra strap Bra - Perfomed by helper: Hook/unhook bra (pull down sports bra) Pull over shirt/dress - Perfomed by patient: Put head through opening, Pull shirt over trunk, Thread/unthread left sleeve, Thread/unthread right sleeve Pull over shirt/dress - Perfomed by helper: Thread/unthread right sleeve        Upper body assist Assist Level: Supervision or verbal cues      Lower Body Dressing/Undressing Lower body dressing   What is the patient wearing?: Pants, Non-skid slipper socks, Socks, Underwear Underwear - Performed by patient: Thread/unthread right underwear leg, Thread/unthread left  underwear leg, Pull underwear up/down   Pants- Performed by patient: Thread/unthread right pants leg, Thread/unthread left pants leg, Pull pants up/down Pants- Performed by helper: Thread/unthread left pants leg Non-skid slipper socks- Performed by patient: Don/doff right sock, Don/doff left sock Non-skid slipper socks- Performed by helper: Don/doff right sock, Don/doff left sock                  Lower body assist Assist for lower body  dressing: Supervision or verbal cues      Toileting Toileting   Toileting steps completed by patient: Performs perineal hygiene Toileting steps completed by helper: Adjust clothing prior to toileting    Toileting assist Assist level: Touching or steadying assistance (Pt.75%)   Transfers Chair/bed transfer   Chair/bed transfer method: Ambulatory Chair/bed transfer assist level: Supervision or verbal cues Chair/bed transfer assistive device: Medical sales representative     Max distance: 150' Assist level: Supervision or verbal cues   Wheelchair   Type: Manual Max wheelchair distance: 150 Assist Level: Supervision or verbal cues  Cognition Comprehension Comprehension assist level: Understands basic 75 - 89% of the time/ requires cueing 10 - 24% of the time  Expression Expression assist level: Expresses basic 90% of the time/requires cueing < 10% of the time.  Social Interaction Social Interaction assist level: Interacts appropriately 90% of the time - Needs monitoring or encouragement for participation or interaction.  Problem Solving Problem solving assist level: Solves basic 75 - 89% of the time/requires cueing 10 - 24% of the time  Memory Memory assist level: Recognizes or recalls 75 - 89% of the time/requires cueing 10 - 24% of the time    Medical Problem List and Plan: 1.  Generalized weakness secondary to left frontal hemorrhage s/p coiling.  Cont CIR 2.  DVT Prophylaxis/Anticoagulation: Mechanical: Sequential compression devices, below knee Bilateral lower extremities  Per Neurosurg, no pharm prophyllaxis 3. H/o migraines/Pain Management: tylenol prn for HA 4. Mood: lacks insight/awareness of deficits. LCSW to follow for evaluation and support when appropriate.  5. Neuropsych: This patient is not capable of making decisions on her own behalf. 6. Skin/Wound Care: routine pressure relief  measures 7. Fluids/Electrolytes/Nutrition: Started calorie count.  8.  Prediabetes: Hgb A1c- 5.9. Will continue to monitor BS ac/hs for now   CBG (last 3)   Recent Labs  04/21/17 1645 04/21/17 2137 04/22/17 0639  GLUCAP 92 100* 103*   Controlled 8/7  9. ABLA:    Hb 9.4 on 8/1 10. 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  12. Labile blood pressure  Cont to monitor  Controlled 8/7   Vitals:   04/21/17 1515 04/22/17 0500  BP: 127/72 136/84  Pulse: 91 93  Resp: 18 18  Temp: 98.8 F (37.1 C) 98.8 F (37.1 C)   13. Transaminitis  Cont to monitor, improving 14. Sepsis resolved  WBCs 9.0 on 8/1  CXR reviewed, unremarkable  UA equivocal, Ucx insig growth  Blood cx pending 8/2  Cont IVF  Abd CT reviewed, showing dislodged PEG and inflammation, PEG exchanged 7/31  Switched from Vanc/Zosyn to Keflex 14. Hypokalemia  K+ 3.6 on 8/6  Supplemented  Cont to monitor 15. AKI  Cr 1.32 on 8/6  Encourage fluids, now off IV abx  Cont to monitor  LOS (Days) 13 A FACE TO FACE EVALUATION WAS PERFORMED  Santi Troung Lorie Phenix 04/22/2017 8:11 AM

## 2017-04-22 NOTE — Progress Notes (Signed)
Occupational Therapy Session Note  Patient Details  Name: Carly [name] MRN: [id number] Date of Birth: 03/31/1971  Today's Date: 04/22/2017 OT Individual Time: 0177-9390 OT Individual Time Calculation (min): 73 min    Short Term Goals: Week 2:  OT Short Term Goal 1 (Week 2): Continue working on established LTGs set at supervision level overall.  Skilled Therapeutic Interventions/Progress Updates:    Pt completed bathing and dressing during session.  Supervision for transfer into and out of the walk-in shower with use of the RW.  She was able to complete all bathing with supervision sit to stand, but needed mod instructional cueing for sequencing secondary to decreased organization.  Completed dressing sit to stand on the 3:1, with therapist changing dressing at PEG site.  Supervision for all dressing except for fastening bra.  Feel pt could probably donn this with setup as well if PEG tube was not in the way.  She was able to brush her teeth at the sink with setup of supplies secondary to not having good contrast with toothpaste and sink surface.  Once finished worked on theraputty exercises and theraband exercises following handout.  Pt able to complete gross grasp, and pinch bilaterally with yellow light resistance putty and max instructional cueing.  Pt performed 1 set of 10 reps for bilateral shoulder flexion and horizontal abduction with mod instructional cueing.  One set of 15 reps completed for bilateral elbow flexion and elbow extension. Light resistance theraband used for all exercises.  Pt left in bedside recliner at end of session with soft touch call button and phone in reach.    Therapy Documentation Precautions:  Precautions Precautions: Fall Precaution Comments: PEG ,  impaired central vision Restrictions Weight Bearing Restrictions: No  Pain: Pain Assessment Pain Assessment: No/denies pain ADL: See Function Navigator for Current Functional Status.   Therapy/Group:  Individual Therapy  Maryiah Olvey OTR/L 04/22/2017, 12:17 PM

## 2017-04-22 NOTE — Discharge Instructions (Signed)
Inpatient Rehab Discharge Instructions  Carly [name] Discharge date and time: 04/23/17   Activities/Precautions/ Functional Status: Activity: no lifting, driving, or strenuous exercise for till cleared by MD Diet: low fat, low cholesterol diet Wound Care: One to two times a day-Wash around PEG site with soap and water. Pat dry and keep dry dressing in place. Contact Dr. Hulen Skains if you develop any problems with your incision/wound--redness, swelling, increase in pain, drainage or if you develop fever or chills.    Functional status:  ___ No restrictions     ___ Walk up steps independently _X__ 24/7 supervision/assistance   ___ Walk up steps with assistance ___ Intermittent supervision/assistance  ___ Bathe/dress independently ___ Walk with walker     _X__ Bathe/dress with assistance ___ Walk Independently    ___ Shower independently _X__ Walk with assistance    ___ Shower with assistance _X__ No alcohol     ___ Return to work/school ________  Special Instructions: 1. Family needs to assist with all activity including medication management.     COMMUNITY REFERRALS UPON DISCHARGE:    Home Health:   PT, OT, SP, RN   Agency:[facility]   [name]   Date of last service:04/23/2017  Medical Equipment/Items Ordered:WHEELCHAIR, ROLLING WALKER, 3 IN 1, TUB BENCH  Agency/Supplier:[facility]    [phone]  Other:[facility] REFERRAL MADE  HUSBAND HAS APPLIED FOR DISABILITY AND ENCOURAGED HIM TO APPLY FOR MEDICAID FOR PT  GENERAL COMMUNITY RESOURCES FOR PATIENT/FAMILY: Support Groups:[facility] SUPPORT GROUP EVERY SECOND Thursday @ 3:00-4:00 PM ON THE REHAB UNIT QUESTIONS CONTACT [name] [phone]  My questions have been answered and I understand these instructions. I will adhere to these goals and the provided educational materials after my discharge from the hospital.  Patient/Caregiver Signature _______________________________ Date  __________  Clinician Signature _______________________________________ Date __________  Please bring this form and your medication list with you to all your follow-up doctor's appointments.

## 2017-04-23 DIAGNOSIS — F4323 Adjustment disorder with mixed anxiety and depressed mood: Secondary | ICD-10-CM

## 2017-04-23 LAB — CBC
HCT: 31.1 % — ABNORMAL LOW (ref 36.0–46.0)
Hemoglobin: 9.7 g/dL — ABNORMAL LOW (ref 12.0–15.0)
MCH: 25.4 pg — ABNORMAL LOW (ref 26.0–34.0)
MCHC: 31.2 g/dL (ref 30.0–36.0)
MCV: 81.4 fL (ref 78.0–100.0)
PLATELETS: 375 10*3/uL (ref 150–400)
RBC: 3.82 MIL/uL — ABNORMAL LOW (ref 3.87–5.11)
RDW: 15 % (ref 11.5–15.5)
WBC: 6.6 10*3/uL (ref 4.0–10.5)

## 2017-04-23 LAB — GLUCOSE, CAPILLARY: GLUCOSE-CAPILLARY: 111 mg/dL — AB (ref 65–99)

## 2017-04-23 MED ORDER — ADULT MULTIVITAMIN W/MINERALS CH: 1.0000 | ORAL_TABLET | Freq: Every day | ORAL | 0 refills | Status: AC

## 2017-04-23 MED ORDER — CEPHALEXIN 500 MG PO CAPS: 500.0000 mg | ORAL_CAPSULE | Freq: Four times a day (QID) | ORAL | 0 refills | Status: DC

## 2017-04-23 MED ORDER — LEVETIRACETAM 1000 MG PO TABS: 1000.0000 mg | ORAL_TABLET | Freq: Two times a day (BID) | ORAL | 0 refills | Status: DC

## 2017-04-23 MED ORDER — PANTOPRAZOLE SODIUM 40 MG PO TBEC: 40.0000 mg | DELAYED_RELEASE_TABLET | Freq: Every day | ORAL | 0 refills | Status: DC

## 2017-04-23 MED ORDER — METOCLOPRAMIDE HCL 5 MG PO TABS: 5.0000 mg | ORAL_TABLET | Freq: Every day | ORAL | 0 refills | Status: DC

## 2017-04-23 NOTE — Progress Notes (Signed)
Patient discharged to home, accompanied by her husband. 

## 2017-04-23 NOTE — Progress Notes (Signed)
Social Work  Discharge Note  The overall goal for the admission was met for:   Discharge location: [place] TO PROVIDE 24 HR CARE  Length of Stay: Yes-14 DAYS  Discharge activity level: Yes-SUPERVISION-MIN LEVEL  Home/community participation: Yes  Services provided included: MD, RD, PT, OT, SLP, RN, CM, TR, Pharmacy, Neuropsych and SW  Financial Services: Private Insurance: [facility]  Follow-up services arranged: Home Health: [place] CARE-PT,OT,SP,RN, DME: [facility]-WHEELCHAIR, ROLLING WALKER, [place] and Patient/Family has no preference for HH/DME agencies  Comments (or additional information):HUSBAND [place], [place]. HUSBAND HAS APPLIED FOR SSD AND WILL APPLY FOR MEDICAID. DISCUSSED PRIVATE DUTY SERVICES UNTIL MEDICAID CAN BE IN PLACE FOR RESOURCES.  Patient/Family verbalized understanding of follow-up arrangements: Yes  Individual responsible for coordination of the follow-up plan: JERRY-HUSBAND  Confirmed correct DME delivered: Elease Hashimoto 04/23/2017    Elease Hashimoto

## 2017-04-23 NOTE — Discharge Summary (Signed)
Physician Discharge Summary  Patient ID: Carly [name] MRN: [id number] DOB/AGE: [birth year] 46 y.o.  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   Significant Diagnostic Studies:  Ct Abdomen W Contrast  Addendum Date: 04/14/2017   ADDENDUM REPORT: 04/14/2017 21:07 ADDENDUM: These results were called by telephone at the time of interpretation on 04/14/2017 at 9:05 pm to Danella Sensing, NP, who verbally acknowledged these results. Electronically Signed   By: Julian Hy M.D.   On: 04/14/2017 21:07   Result Date: 04/14/2017 CLINICAL DATA:  Drainage from PEG tube site, pain, low-grade fever EXAM: CT ABDOMEN WITH CONTRAST TECHNIQUE: Multidetector CT imaging of the abdomen was performed using the standard protocol following bolus administration of intravenous contrast. CONTRAST:  94mL ISOVUE-300 IOPAMIDOL (ISOVUE-300) INJECTION 61% COMPARISON:  None. FINDINGS: Lower chest: Lung bases are clear. Hepatobiliary: Liver is within normal limits. Gallbladder is unremarkable. No intrahepatic extrahepatic ductal dilatation. Pancreas: Within normal limits. Spleen: Within normal limits. Adrenals/Urinary Tract: Adrenal glands are within normal limits. Kidneys are within normal limits.  No hydronephrosis. Stomach/Bowel: Gastrostomy tube has become dislodged from the gastric antrum and now resides within the subcutaneous tissues of the anterior abdominal wall (series 3/ image 35). A patent track is present between the gastrostomy tube and the distal gastric antrum (series 3/ images 33-35).  Visualized bowel is otherwise unremarkable. Vascular/Lymphatic: No evidence of abdominal aortic aneurysm. No suspicious abdominal lymphadenopathy. Other: No abdominal ascites. Mild subcutaneous stranding along the anterior abdominal wall. No drainable fluid collection/abscess. Musculoskeletal: Visualized osseous structures are within normal limits. IMPRESSION: Dislodged gastrostomy tube, now positioned in the subcutaneous tissues of the anterior abdominal wall. Surrounding inflammatory changes. No drainable fluid collection/abscess. Given malposition, cessation of feeds via tube is suggested until replacement can be performed. Electronically Signed: By: Julian Hy M.D. On: 04/14/2017 20:58   I Labs:  Basic Metabolic Panel: BMP Latest Ref Rng & Units 04/21/2017 04/18/2017 04/15/2017  Glucose 65 - 99 mg/dL 107(H) 127(H) 134(H)  BUN 6 - 20 mg/dL 6 <5(L) <5(L)  Creatinine 0.44 - 1.00 mg/dL 1.32(H) 1.21(H) 1.04(H)  BUN/Creat Ratio 9 - 23 - - -  Sodium 135 - 145 mmol/L 144 141 142  Potassium 3.5 - 5.1 mmol/L 3.6 3.3(L) 3.4(L)  Chloride 101 - 111 mmol/L 102 107 106  CO2 22 - 32 mmol/L 30 26 27   Calcium 8.9 - 10.3 mg/dL 9.6 9.3 8.5(L)    CBC: CBC Latest Ref Rng & Units 04/23/2017 04/16/2017 04/14/2017  WBC 4.0 - 10.5 K/uL 6.6 9.0 11.8(H)  Hemoglobin 12.0 - 15.0 g/dL 9.7(L) 9.4(L) 9.7(L)  Hematocrit 36.0 - 46.0 % 31.1(L) 30.7(L) 30.7(L)  Platelets 150 - 400 K/uL 375 356 331    CBG:  Recent Labs Lab 04/22/17 0639 04/22/17 1215 04/22/17 1654 04/22/17 2039 04/23/17 0637  GLUCAP 103* 110* 108* 112* 111*    Brief HPI:   Carly [name] a 46 y.o.right femalewith history of HTN, GERD, migraines who was admitted on 03/03/17 with HA and focal seizure activity and developed progressive decrease in LOC requiring intubation in ED. History taken from chart review and family.  CTA  head/neck revealed ruptured intracranial aneurysm with hemorrhage into the left inferior frontal gyrus and left lateral  ventricle with generalized vasospasm--severe in B-ACAs. Intraventricular catheter placed by Dr. Cyndy Freeze and she underwent cerebral angio with coiling of L-ICA aneurysm by Dr. Kathyrn Sheriff. Hospital course significant for blood drainage from IVC, multiple episodes of self extubation, HCAP, hypotension, lack of responsiveness as well as signs of minimal neurologic improvement. She was made DNR and family elected extubation with monitoring. EVD clamped with follow up Cranial CT, reviewed, showing increase in hydrocephalus.  Per report, no change in MS therefore discontinued 7/14. Mentation improving and PEG placed for nutritional support by Dr. Hulen Skains on 7/17. Hospital course complicated by tachypnea, tachycardia, ABLA.  As lethargy resolved, she was started on regular diet yesterday. Po intake poor and requiring assistance with meals. she continues to have diffuse weakness with delayed processing, significant visual deficits and significant deficits in mobility. CIR recommended by rehab team.    Hospital Course: Carly [name] was admitted to rehab 04/09/2017 for inpatient therapies to consist of PT, ST and OT at least three hours five days a week. Past admission physiatrist, therapy team and rehab RN have worked together to provide customized collaborative inpatient rehab. Blood pressures were monitored on bid basis and have been controlled overall.  Fasting BS noted to be mildly elevated and Hgb A1c-5.9. Blood sugar were monitored on ac/hs and have been controlled. Po intake has improved and she is tolerating regular diet without difficulty. She developed fevers up to 102 with chills on 7/29 due to SIRS and was pan cultured. She was started on IV vancomycin and Zosyn empirically. She reported increase in pain around PEG side and was noted to have drainage with mild edema. CCS was consulted for input and CT abdomen showed gastrostomy tube to be dislodged and in anterior abdominal wall. Dr. Hulen Skains recommended  replacement of tube which was done by radiology on 7/30. She defervesced with resolution of leucocytosis and antibiotic were narrowed to Keflex on 8/2 for 7 additional days of antibiotic therapy.  ABLA is stable and follow up lytes showed acute renal insufficiency likely due to Vancomycin. Hypokalemia has resolved with brief supplementation.  Abnormal LFTs are resolving. PEG site tenderness has resolved with minimal drainage on dressing. She has been seizure free on Keppra and headaches have been managed by tylenol on prn basis. She has reported improvement in central vision in the past few days, increased activation with improvement in processing as well as improvement in diffuse weakness.  She has been referred to service for the blind. Husband has been educated on PEG care, need for supervision and all aspects of care. She will continue to receive follow up [place], [place], [place] and [place].      Rehab course: During patient's stay in rehab weekly team conferences were held to monitor patient's progress, set goals and discuss barriers to discharge. At admission, patient required mod assist with basic self care tasks and mobility. Speech intelligibility was at 50% and and she exhibited moderate cognitive deficits affecting recall, attention, orientation and task initiation. She has had improvement in activity tolerance, balance, postural control, as well as ability to compensate for deficits. She requires supervision with set up assist for ADL tasks except for min assist to fasten her bra. She is able to perform transfers with supervision and is ambulating 200' with RW and supervision.  She requires supervision for recall and problem solving due to visual deficits. She requires min assist to supervision for recall  of new information, vocal intensity, attention and for awareness.     Disposition: 01-Home or Self Care  Diet: Low fat, Low cholesterol   Special Instructions: 1. Needs 24 hours supervision. 2.  Needs CMET rechecked in 5-7 days.    Discharge Instructions    Ambulatory referral to Physical Medicine Rehab    Complete by:  As directed    1-2 weeks transitional care appt     Allergies as of 04/23/2017   No Known Allergies     Medication List    STOP taking these medications   butalbital-acetaminophen-caffeine 50-325-40-30 MG capsule Commonly known as:  FIORICET WITH CODEINE   cyclobenzaprine 10 MG tablet Commonly known as:  FLEXERIL   feeding supplement (OSMOLITE 1.2 CAL) Liqd   feeding supplement (PRO-STAT SUGAR FREE 64) Liqd   HYDROcodone-acetaminophen 7.5-325 mg/15 ml solution Commonly known as:  HYCET   levETIRAcetam 100 MG/ML solution Commonly known as:  KEPPRA Replaced by:  levETIRAcetam 1000 MG tablet   MIDOL 200 MG Caps Generic drug:  Ibuprofen   triamterene-hydrochlorothiazide 37.5-25 MG tablet Commonly known as:  MAXZIDE-25     TAKE these medications   cephALEXin 500 MG capsule Commonly known as:  KEFLEX Take 1 capsule (500 mg total) by mouth every 6 (six) hours.   levETIRAcetam 1000 MG tablet Commonly known as:  KEPPRA Take 1 tablet (1,000 mg total) by mouth 2 (two) times daily. Replaces:  levETIRAcetam 100 MG/ML solution   metoCLOPramide 5 MG tablet Commonly known as:  REGLAN Take 1 tablet (5 mg total) by mouth daily.   multivitamin with minerals Tabs tablet Take 1 tablet by mouth daily.   pantoprazole 40 MG tablet Commonly known as:  PROTONIX Take 1 tablet (40 mg total) by mouth daily.      Follow-up Information    [name], MD Follow up on 04/28/2017.   Specialty:  Family Medicine Why:  Appointment @ 11:00 AM Contact information: [address] [place] [address] [phone]        Jamse Arn, MD Follow up.   Specialty:  Physical Medicine and Rehabilitation Why:  office will call you with follow up appointment Contact information: [address] [place]  [address] [phone]        Gevena Cotton, MD Follow up.   Specialty:  Ophthalmology Contact information: [place] [address] [phone]        Consuella Lose, MD. Call in 1 day(s).   Specialty:  Neurosurgery Why:  for follow up in 2 weeks.  Contact information: [address] [place] [address] [phone]        Judeth Horn, MD. Call in 1 day(s).   Specialty:  General Surgery Why:  for appointment in 3-4 weeks to have PEG tube removed Contact information: [place] [employer] [place] [address] [phone]           Signed: Bary Leriche 04/23/2017, 6:53 PM

## 2017-04-23 NOTE — Progress Notes (Signed)
Discussed and demonstrated G tube care and dressing changes with patient and spouse. Both expressed confidence in their ability to accomplish the tasks.

## 2017-04-23 NOTE — Progress Notes (Signed)
[facility]     PROGRESS NOTE  Subjective/Complaints:  Pt seen laying in bed this AM.  She slept well overnight.  She is ready for discharge.  Per nursing, minimal drainage from PEG site and improving vision.   ROS: Denies CP, SOB, N/V/D.  Objective: Vital Signs: Blood pressure 129/83, pulse 96, temperature 98.9 F (37.2 C), temperature source Oral, resp. rate 16, height 5\' 7"  (1.702 m), weight 94.8 kg (208 lb 14.5 oz), SpO2 99 %. No results found.  Recent Labs  04/23/17 0446  WBC 6.6  HGB 9.7*  HCT 31.1*  PLT 375    Recent Labs  04/21/17 0402  NA 144  K 3.6  CL 102  GLUCOSE 107*  BUN 6  CREATININE 1.32*  CALCIUM 9.6   CBG (last 3)   Recent Labs  04/22/17 1654 04/22/17 2039 04/23/17 0637  GLUCAP 108* 112* 111*    Wt Readings from Last 3 Encounters:  04/23/17 94.8 kg (208 lb 14.5 oz)  04/09/17 98.2 kg (216 lb 7.9 oz)  02/25/17 109 kg (240 lb 3.2 oz)    Physical Exam:  BP 129/83 (BP Location: Left Arm)   Pulse 96   Temp 98.9 F (37.2 C) (Oral)   Resp 16   Ht 5\' 7"  (1.702 m)   Wt 94.8 kg (208 lb 14.5 oz)   LMP  (Within Months)   SpO2 99%   BMI 32.72 kg/m  Constitutional: She appears well-developed. Obese  HENT: Normocephalic and atraumatic.  Eyes: No discharge. EOMI. Cardiovascular: RRR. No JVD. Respiratory: Effort normal and breath sounds normal.  GI: Soft. Bowel sounds are normal. +PEG.   Musculoskeletal: She exhibits no edema or tenderness.  Neurological: She is alert (response time continues to improve).  Does not make eye contact due to visual deficits Motor: B/l UE 4/5 proximal to distal RLE: 4/5 HF, KE, 4+/5 ADF/PF (stable) LLE: 4/5 HF, KE, 4+/5 ADF/PF (stable) Skin: Skin is warm and dry. She is not diaphoretic.  Psychiatric: Her affect is blunt. Her speech is delayed. She is slowed. She does not express impulsivity. She is attentive.    Assessment/Plan: 1. Functional deficits secondary to left  frontal hemorrhage s/p coiling which require 3+ hours per day of interdisciplinary therapy in a comprehensive inpatient rehab setting. Physiatrist is providing close team supervision and 24 hour management of active medical problems listed below. Physiatrist and rehab team continue to assess barriers to discharge/monitor patient progress toward functional and medical goals.  Function:  Bathing Bathing position   Position: Shower  Bathing parts Body parts bathed by patient: Right arm, Left arm, Chest, Abdomen, Front perineal area, Right upper leg, Left upper leg, Right lower leg, Left lower leg, Buttocks, Back Body parts bathed by helper: Back  Bathing assist Assist Level: Supervision or verbal cues      Upper Body Dressing/Undressing Upper body dressing   What is the patient wearing?: Pull over shirt/dress, Bra Bra - Perfomed by patient: Thread/unthread right bra strap, Thread/unthread left bra strap Bra - Perfomed by helper: Hook/unhook bra (pull down sports bra) Pull over shirt/dress - Perfomed by patient: Put head through opening, Pull shirt over trunk, Thread/unthread left sleeve, Thread/unthread right sleeve Pull over shirt/dress - Perfomed by helper: Thread/unthread right sleeve        Upper body assist Assist Level: Supervision or verbal cues      Lower Body Dressing/Undressing Lower body dressing   What is the patient wearing?: Pants, Non-skid slipper socks, Socks  Underwear - Performed by patient: Thread/unthread right underwear leg, Thread/unthread left underwear leg, Pull underwear up/down   Pants- Performed by patient: Thread/unthread right pants leg, Thread/unthread left pants leg, Pull pants up/down Pants- Performed by helper: Thread/unthread left pants leg Non-skid slipper socks- Performed by patient: Don/doff right sock, Don/doff left sock Non-skid slipper socks- Performed by helper: Don/doff right sock, Don/doff left sock                  Lower body assist  Assist for lower body dressing: Supervision or verbal cues      Toileting Toileting   Toileting steps completed by patient: Adjust clothing prior to toileting, Performs perineal hygiene, Adjust clothing after toileting Toileting steps completed by helper: Adjust clothing prior to toileting    Toileting assist Assist level: Supervision or verbal cues   Transfers Chair/bed transfer   Chair/bed transfer method: Ambulatory Chair/bed transfer assist level: Supervision or verbal cues Chair/bed transfer assistive device: Walker, Air cabin crew     Max distance: 200' Assist level: Supervision or verbal cues   Wheelchair   Type: Manual Max wheelchair distance: 150' Assist Level: Supervision or verbal cues  Cognition Comprehension Comprehension assist level: Understands basic 90% of the time/cues < 10% of the time  Expression Expression assist level: Expresses basic 90% of the time/requires cueing < 10% of the time.  Social Interaction Social Interaction assist level: Interacts appropriately 90% of the time - Needs monitoring or encouragement for participation or interaction.  Problem Solving Problem solving assist level: Solves basic 90% of the time/requires cueing < 10% of the time  Memory Memory assist level: Recognizes or recalls 90% of the time/requires cueing < 10% of the time    Medical Problem List and Plan: 1.  Generalized weakness secondary to left frontal hemorrhage s/p coiling.  D/c today   Will see patient in 1-2 weeks for transitional care management 2.  DVT Prophylaxis/Anticoagulation: Mechanical: Sequential compression devices, below knee Bilateral lower extremities  Per Neurosurg, no pharm prophyllaxis 3. H/o migraines/Pain Management: tylenol prn for HA 4. Mood: lacks insight/awareness of deficits. LCSW to follow for evaluation and support when appropriate.  5. Neuropsych: This patient is not capable of making decisions on her own behalf. 6.  Skin/Wound Care: routine pressure relief  measures 7. Fluids/Electrolytes/Nutrition: Monitor I/Os.  8. Prediabetes: Hgb A1c- 5.9. Will continue to monitor BS ac/hs for now   CBG (last 3)   Recent Labs  04/22/17 1654 04/22/17 2039 04/23/17 0637  GLUCAP 108* 112* 111*   Controlled 8/8  9. ABLA:    Hb 9.7 on 8/8 10. 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  12. Labile blood pressure  Cont to monitor  Controlled 8/8   Vitals:   04/22/17 1448 04/23/17 0529  BP: (!) 144/91 129/83  Pulse: 88 96  Resp: 17 16  Temp: 99.1 F (37.3 C) 98.9 F (37.2 C)   13. Transaminitis  Cont to monitor, improving 14. Sepsis resolved  WBCs 9.0 on 8/1  CXR reviewed, unremarkable  UA equivocal, Ucx insig growth  Blood cx pending 8/2  Cont IVF  Abd CT reviewed, showing dislodged PEG and inflammation, PEG exchanged 7/31  Switched from Vanc/Zosyn to Keflex until tomorrow 14. Hypokalemia  K+ 3.6 on 8/6  Supplemented  Cont to monitor 15. AKI  Cr 1.32 on 8/6  Encourage fluids, now off IV abx  Cont to monitor  Will need labs as outpt  LOS (Days) 14 A FACE TO FACE EVALUATION WAS PERFORMED  Noele Icenhour Lorie Phenix 04/23/2017 8:11 AM

## 2017-04-24 ENCOUNTER — Telehealth: Payer: Self-pay | Admitting: *Deleted

## 2017-04-24 NOTE — Telephone Encounter (Signed)
Transitional Care call-I spoke with Mr Roussell    1. Are you/is patient experiencing any problems since coming home? Are there any questions regarding any aspect of care?NO 2. Are there any questions regarding medications administration/dosing? Are meds being taken as prescribed? Patient should review meds with caller to confirm SHE HAS ALL MEDICATIONS. THEY WERE DISCUSSING THE NEED TO KEEP THE TWO MEDICATIONS SEPARATE "KEPPRA, AND KEFLEX"  3. Have there been any falls? NO 4. Has Home Health been to the house and/or have they contacted you? If not, have you tried to contact them? Can we help you contact them?THEY ARE COMING OUT TODAY 5. Are bowels and bladder emptying properly? Are there any unexpected incontinence issues? If applicable, is patient following bowel/bladder programs?NO 6. Any fevers, problems with breathing, unexpected pain?NO 7. Are there any skin problems or new areas of breakdown?NO 8. Has the patient/family member arranged specialty MD follow up (ie cardiology/neurology/renal/surgical/etc)?  Can we help arrange? 9. Does the patient need any other services or support that we can help arrange?APPT GIVEN TO SEE DR PATEL 10. Are caregivers following through as expected in assisting the patient?YES 11. Has the patient quit smoking, drinking alcohol, or using drugs as recommended?N/A  Appointment time, 04/30/17 Wednesday, arrive 12:00 for a 12:20 appt with Dr Posey Pronto ADDRESS REVIEWED [phone]

## 2017-04-28 ENCOUNTER — Emergency Department (HOSPITAL_COMMUNITY)
Admission: EM | Admit: 2017-04-28 | Discharge: 2017-04-28 | Disposition: A | Discharge status: Home / Self Care | Payer: 59 | Attending: Emergency Medicine | Admitting: Emergency Medicine

## 2017-04-28 ENCOUNTER — Emergency Department (HOSPITAL_COMMUNITY): Payer: 59

## 2017-04-28 ENCOUNTER — Encounter (HOSPITAL_COMMUNITY): Payer: Self-pay | Admitting: Emergency Medicine

## 2017-04-28 ENCOUNTER — Telehealth: Payer: Self-pay

## 2017-04-28 DIAGNOSIS — R109 Unspecified abdominal pain: Secondary | ICD-10-CM | POA: Diagnosis present

## 2017-04-28 DIAGNOSIS — R112 Nausea with vomiting, unspecified: Secondary | ICD-10-CM | POA: Diagnosis not present

## 2017-04-28 DIAGNOSIS — R1033 Periumbilical pain: Secondary | ICD-10-CM

## 2017-04-28 DIAGNOSIS — I10 Essential (primary) hypertension: Secondary | ICD-10-CM | POA: Diagnosis not present

## 2017-04-28 DIAGNOSIS — Z0189 Encounter for other specified special examinations: Secondary | ICD-10-CM

## 2017-04-28 LAB — CBC
HEMATOCRIT: 35.6 % — AB (ref 36.0–46.0)
HEMOGLOBIN: 11.3 g/dL — AB (ref 12.0–15.0)
MCH: 25.3 pg — AB (ref 26.0–34.0)
MCHC: 31.7 g/dL (ref 30.0–36.0)
MCV: 79.8 fL (ref 78.0–100.0)
Platelets: 324 10*3/uL (ref 150–400)
RBC: 4.46 MIL/uL (ref 3.87–5.11)
RDW: 14.5 % (ref 11.5–15.5)
WBC: 7.9 10*3/uL (ref 4.0–10.5)

## 2017-04-28 LAB — URINALYSIS, ROUTINE W REFLEX MICROSCOPIC
Bilirubin Urine: NEGATIVE
Glucose, UA: NEGATIVE mg/dL
HGB URINE DIPSTICK: NEGATIVE
Ketones, ur: NEGATIVE mg/dL
Nitrite: NEGATIVE
PROTEIN: 100 mg/dL — AB
Specific Gravity, Urine: 1.006 (ref 1.005–1.030)
pH: 7 (ref 5.0–8.0)

## 2017-04-28 LAB — COMPREHENSIVE METABOLIC PANEL
ALBUMIN: 3.6 g/dL (ref 3.5–5.0)
ALT: 26 U/L (ref 14–54)
AST: 45 U/L — AB (ref 15–41)
Alkaline Phosphatase: 114 U/L (ref 38–126)
Anion gap: 9 (ref 5–15)
BILIRUBIN TOTAL: 1.1 mg/dL (ref 0.3–1.2)
CO2: 30 mmol/L (ref 22–32)
CREATININE: 1.07 mg/dL — AB (ref 0.44–1.00)
Calcium: 9.5 mg/dL (ref 8.9–10.3)
Chloride: 103 mmol/L (ref 101–111)
GFR calc Af Amer: >60 mL/min (ref >60)
GLUCOSE: 114 mg/dL — AB (ref 65–99)
POTASSIUM: 4 mmol/L (ref 3.5–5.1)
Sodium: 142 mmol/L (ref 135–145)
TOTAL PROTEIN: 7 g/dL (ref 6.5–8.1)

## 2017-04-28 LAB — LIPASE, BLOOD: Lipase: 106 U/L — ABNORMAL HIGH (ref 11–51)

## 2017-04-28 LAB — I-STAT CG4 LACTIC ACID, ED: LACTIC ACID, VENOUS: 1.41 mmol/L (ref 0.5–1.9)

## 2017-04-28 LAB — PREGNANCY, URINE: Preg Test, Ur: NEGATIVE

## 2017-04-28 MED ORDER — IOPAMIDOL (ISOVUE-300) INJECTION 61%
INTRAVENOUS | Status: AC
  Filled 2017-04-28: qty 50

## 2017-04-28 MED ORDER — IOPAMIDOL (ISOVUE-300) INJECTION 61%
50.0000 mL | Freq: Once | INTRAVENOUS | Status: AC | PRN
  Administered 2017-04-28: 50 mL via ORAL

## 2017-04-28 MED ORDER — ONDANSETRON 4 MG PO TBDP: 4.0000 mg | ORAL_TABLET | Freq: Three times a day (TID) | ORAL | 0 refills | Status: DC | PRN

## 2017-04-28 MED ORDER — ONDANSETRON 4 MG PO TBDP: 4.0000 mg | ORAL_TABLET | Freq: Once | ORAL | Status: DC

## 2017-04-28 MED ORDER — ONDANSETRON HCL 4 MG PO TABS: 4.0000 mg | ORAL_TABLET | Freq: Four times a day (QID) | ORAL | 0 refills | Status: DC

## 2017-04-28 MED ORDER — METOCLOPRAMIDE HCL 5 MG/ML IJ SOLN
10.0000 mg | Freq: Once | INTRAMUSCULAR | Status: AC
  Administered 2017-04-28: 10 mg via INTRAVENOUS
  Filled 2017-04-28: qty 2

## 2017-04-28 MED ORDER — IOPAMIDOL (ISOVUE-300) INJECTION 61%
INTRAVENOUS | Status: AC
  Administered 2017-04-28: 100 mL via INTRAVENOUS
  Filled 2017-04-28: qty 100

## 2017-04-28 MED ORDER — ONDANSETRON HCL 4 MG/2ML IJ SOLN
4.0000 mg | Freq: Once | INTRAMUSCULAR | Status: AC
  Administered 2017-04-28: 4 mg via INTRAVENOUS
  Filled 2017-04-28: qty 2

## 2017-04-28 MED ORDER — ACETAMINOPHEN 325 MG PO TABS
650.0000 mg | ORAL_TABLET | Freq: Once | ORAL | Status: AC
  Administered 2017-04-28: 650 mg via ORAL
  Filled 2017-04-28: qty 2

## 2017-04-28 NOTE — ED Notes (Signed)
Pt began vomiting again, edp notified

## 2017-04-28 NOTE — Telephone Encounter (Signed)
[address] [facility] called [phone] requesting verbal orders to see this patient 2xwk X 1wk, 1wk x 1wk, 2xwk X 2wks

## 2017-04-28 NOTE — Discharge Instructions (Addendum)
Take Zofran every 8 hours as needed for nausea or vomiting. You can take Tylenol as prescribed over-the-counter, as needed for your pain. Please follow-up with Dr. Hulen Skains as scheduled on Wednesday for further evaluation and treatment. Please return the emergency Department if you develop any new or worsening symptoms.

## 2017-04-28 NOTE — ED Triage Notes (Signed)
Pt here with abd pain and nausea; pt sts pain a PEG tube site; pt had recent long hospital stay for aneurysm repair

## 2017-04-28 NOTE — ED Provider Notes (Signed)
[facility] DEPT Provider Note   CSN: [id number] Arrival date & time: 04/28/17  0840     History   Chief Complaint Chief Complaint  Patient presents with  . Abdominal Pain    HPI Carly [name] is a 46 y.o. female with history recent extended hospital stay for [facility] after ruptured cerebral aneurysm, hypertension, PEG tube placement who presents with abdominal pain around her PEG tube as well as nausea and vomiting that began this morning. Her pain is worse with movement. Patient reports mild low back pain as well. Patient states that her menstrual cycle should be due to come, although she did not have its at all in the month of July during her hospital stay. She states that she does not normally have symptoms like nausea and vomiting with her initial cycle, however. Patient did not take any medications at home for her symptoms. Patient states she has not been using the PEG tube because she can eat by mouth now, although her husband has continued to flush the PEG tube. Patient does not have any pain with flushing. Patient denies any fevers, chest pain, shortness of breath, abnormal bowel movements, or urinary symptoms. Patient's husband spoke with Dr. Hulen Skains, the patient's surgeon, who advised patient come here for x-ray with dye to evaluate PEG placement.  HPI  Past Medical History:  Diagnosis Date  . GERD (gastroesophageal reflux disease)   . Hypertension   . Migraines     Patient Active Problem List   Diagnosis Date Noted  . AKI (acute kidney injury) ([place])   . Adjustment disorder with mixed anxiety and depressed mood   . Dislodged gastrostomy tube ([place])   . Sepsis ([place])   . Hypokalemia   . Elevated temperature   . SIRS (systemic inflammatory response syndrome) ([facility])   . Decreased oral intake   . Cognitive deficit, post-stroke   . Prediabetes   . Labile blood pressure   . Transaminitis   . 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 Hydrocephalus   . Level of  consciousness decreased   . SAH (subarachnoid hemorrhage) ([place])   . Tachycardia   . PEG (percutaneous endoscopic gastrostomy) status ([place])   . Acute blood loss anemia   . Tachypnea   . Ventilator dependent ([place])   . Palliative care by specialist   . Electrolyte imbalance 03/19/2017  . History of ETT   . Encounter for intubation   . Subarachnoid hemorrhage ([place])   . Acute respiratory failure ([place])   . Ruptured cerebral aneurysm ([place]) 03/03/2017  . Routine general medical examination at a health care facility 08/23/2013  . Essential hypertension, benign 08/23/2013  . Impaired fasting glucose 08/23/2013  . Plantar fasciitis, bilateral 08/23/2013  . Unspecified vitamin D deficiency 08/23/2013  . Obesity, unspecified 08/23/2013  . Unspecified essential hypertension 12/31/2012  . GERD (gastroesophageal reflux disease) 12/31/2012  . COMMON MIGRAINE 06/16/2008  . CHEST PAIN, INTERMITTENT 06/16/2008    Past Surgical History:  Procedure Laterality Date  . CESAREAN SECTION    . ESOPHAGOGASTRODUODENOSCOPY N/A 04/01/2017   Procedure: ESOPHAGOGASTRODUODENOSCOPY (EGD);  Surgeon: Georganna Skeans, MD;  Location: [facility];  Service: General;  Laterality: N/A;  . IR ANGIO INTRA EXTRACRAN SEL INTERNAL CAROTID UNI R MOD SED  03/03/2017  . IR ANGIO VERTEBRAL SEL VERTEBRAL BILAT MOD SED  03/03/2017  . IR ANGIOGRAM FOLLOW UP STUDY  03/03/2017  . IR ANGIOGRAM FOLLOW UP STUDY  03/03/2017  . IR ANGIOGRAM FOLLOW UP STUDY  03/03/2017  . IR  ANGIOGRAM FOLLOW UP STUDY  03/03/2017  . IR REPLC GASTRO/COLONIC TUBE PERCUT W/FLUORO  04/15/2017  . IR TRANSCATH/EMBOLIZ  03/03/2017  . PEG PLACEMENT N/A 04/01/2017   Procedure: PERCUTANEOUS ENDOSCOPIC GASTROSTOMY (PEG) PLACEMENT;  Surgeon: Georganna Skeans, MD;  Location: [facility];  Service: General;  Laterality: N/A;  . RADIOLOGY WITH ANESTHESIA N/A 03/03/2017   Procedure: RADIOLOGY WITH ANESTHESIA;  Surgeon: Consuella Lose, MD;  Location: [facility];  Service:  Radiology;  Laterality: N/A;    OB History    No data available       Home Medications    Prior to Admission medications   Medication Sig Start Date End Date Taking? Authorizing Provider  cephALEXin (KEFLEX) 500 MG capsule Take 1 capsule (500 mg total) by mouth every 6 (six) hours. 04/23/17   Love, Ivan Anchors, PA-C  levETIRAcetam (KEPPRA) 1000 MG tablet Take 1 tablet (1,000 mg total) by mouth 2 (two) times daily. 04/23/17   Love, Ivan Anchors, PA-C  metoCLOPramide (REGLAN) 5 MG tablet Take 1 tablet (5 mg total) by mouth daily. 04/24/17   Love, Ivan Anchors, PA-C  Multiple Vitamin (MULTIVITAMIN WITH MINERALS) TABS tablet Take 1 tablet by mouth daily. 04/24/17   Love, Ivan Anchors, PA-C  ondansetron (ZOFRAN ODT) 4 MG disintegrating tablet Take 1 tablet (4 mg total) by mouth every 8 (eight) hours as needed for nausea or vomiting. 04/28/17   Addalee Kavanagh, Bea Graff, PA-C  pantoprazole (PROTONIX) 40 MG tablet Take 1 tablet (40 mg total) by mouth daily. 04/24/17   Bary Leriche, PA-C    Family History Family History  Problem Relation Age of Onset  . Diabetes Mother   . Hypertension Mother   . Hyperlipidemia Mother   . Cancer Father        colon cancer  . Diabetes Maternal Grandmother   . Hyperlipidemia Paternal Grandfather     Social History Social History  Substance Use Topics  . Smoking status: Never Smoker  . Smokeless tobacco: Never Used  . Alcohol use Yes     Comment: occ     Allergies   Patient has no known allergies.   Review of Systems Review of Systems  Constitutional: Negative for chills and fever.  HENT: Negative for facial swelling and sore throat.   Respiratory: Negative for shortness of breath.   Cardiovascular: Negative for chest pain.  Gastrointestinal: Positive for abdominal pain, nausea and vomiting. Negative for blood in stool, constipation and diarrhea.  Genitourinary: Negative for dysuria.  Musculoskeletal: Negative for back pain.  Skin: Negative for rash and wound.    Neurological: Negative for headaches.  Psychiatric/Behavioral: The patient is not nervous/anxious.      Physical Exam Updated Vital Signs BP (!) 172/95   Pulse 84   Temp 98.6 F (37 C) (Oral)   Resp 18   Ht 5\' 2"  (1.575 m)   Wt 108.9 kg (240 lb)   SpO2 97%   BMI 43.90 kg/m   Physical Exam  Constitutional: She appears well-developed and well-nourished. No distress.  HENT:  Head: Normocephalic and atraumatic.  Mouth/Throat: Oropharynx is clear and moist. No oropharyngeal exudate.  Eyes: Pupils are equal, round, and reactive to light. Conjunctivae are normal. Right eye exhibits no discharge. Left eye exhibits no discharge. No scleral icterus.  Neck: Normal range of motion. Neck supple. No thyromegaly present.  Cardiovascular: Normal rate, regular rhythm, normal heart sounds and intact distal pulses.  Exam reveals no gallop and no friction rub.   No murmur heard. Pulmonary/Chest: Effort  normal and breath sounds normal. No stridor. No respiratory distress. She has no wheezes. She has no rales.  Abdominal: Soft. Bowel sounds are normal. She exhibits no distension. There is tenderness. There is no rebound and no guarding.    Musculoskeletal: She exhibits no edema.  Lymphadenopathy:    She has no cervical adenopathy.  Neurological: She is alert. Coordination normal.  Skin: Skin is warm and dry. No rash noted. She is not diaphoretic. No pallor.  Psychiatric: She has a normal mood and affect.  Nursing note and vitals reviewed.    ED Treatments / Results  Labs (all labs ordered are listed, but only abnormal results are displayed) Labs Reviewed  LIPASE, BLOOD - Abnormal; Notable for the following:       Result Value   Lipase 106 (*)    All other components within normal limits  COMPREHENSIVE METABOLIC PANEL - Abnormal; Notable for the following:    Glucose, Bld 114 (*)    BUN <5 (*)    Creatinine, Ser 1.07 (*)    AST 45 (*)    All other components within normal limits   CBC - Abnormal; Notable for the following:    Hemoglobin 11.3 (*)    HCT 35.6 (*)    MCH 25.3 (*)    All other components within normal limits  URINALYSIS, ROUTINE W REFLEX MICROSCOPIC - Abnormal; Notable for the following:    APPearance HAZY (*)    Protein, ur 100 (*)    Leukocytes, UA SMALL (*)    Bacteria, UA RARE (*)    Squamous Epithelial / LPF 6-30 (*)    All other components within normal limits  URINE CULTURE  PREGNANCY, URINE  I-STAT CG4 LACTIC ACID, ED  I-STAT CG4 LACTIC ACID, ED    EKG  EKG Interpretation None       Radiology Ct Abdomen Pelvis W Contrast  Result Date: 04/28/2017 CLINICAL DATA:  Pain at gastrostomy tube site. EXAM: CT ABDOMEN AND PELVIS WITH CONTRAST TECHNIQUE: Multidetector CT imaging of the abdomen and pelvis was performed using the standard protocol following bolus administration of intravenous contrast. CONTRAST:  152mL ISOVUE-300 IOPAMIDOL (ISOVUE-300) INJECTION 61% COMPARISON:  CT abdomen 04/14/2017 FINDINGS: Lower chest: No pulmonary nodules or pleural effusion. No visible pericardial effusion. Hepatobiliary: Normal hepatic contours and density. No visible biliary dilatation. Normal gallbladder. Pancreas: Normal contours without ductal dilatation. No peripancreatic fluid collection. Spleen: Normal. Adrenals/Urinary Tract: --Adrenal glands: Normal. --Right kidney/ureter: No hydronephrosis or perinephric stranding. No nephrolithiasis. No obstructing ureteral stones. --Left kidney/ureter: No hydronephrosis or perinephric stranding. No nephrolithiasis. No obstructing ureteral stones. --Urinary bladder: Unremarkable. Stomach/Bowel: --Stomach/Duodenum: Midline approach percutaneous gastrostomy tube balloon tip is in the proximal jejunum. --Small bowel: No dilatation or inflammation. --Colon: No focal abnormality. --Appendix: Normal. Vascular/Lymphatic: Normal course and caliber of the major abdominal vessels. No abdominal or pelvic lymphadenopathy.  Reproductive: 2 predominantly hypodense fibroids at the uterine fundus and a small are suspected intramural or submucosal fibroid of the lower uterine segment. No adnexal mass. Musculoskeletal. No bony spinal canal stenosis or focal osseous abnormality. Other: There is very mild edema surrounding the subcutaneous course of the percutaneous tube. No abscess or other fluid collection. IMPRESSION: 1. Minimal edema surrounding the gastrostomy tube site. The tip of the tube is in the proximal jejunum. 2. No acute abnormality of the abdomen or pelvis. 3. Multiple uterine fibroids. Electronically Signed   By: Ulyses Jarred M.D.   On: 04/28/2017 16:20   Dg Abdomen Peg Tube Location  Result Date: 04/28/2017 CLINICAL DATA:  Assess positioning of PEG tube with contrast. EXAM: ABDOMEN - 1 VIEW COMPARISON:  None in PACs FINDINGS: A PEG tube is in place with contrast present within the jejunum. No extraluminal contrast is observed. IMPRESSION: The PEG tube appears to lie within the jejunum. Electronically Signed   By: David  Martinique M.D.   On: 04/28/2017 13:33    Procedures Procedures (including critical care time)  Medications Ordered in ED Medications  iopamidol (ISOVUE-300) 61 % injection (not administered)  ondansetron (ZOFRAN) injection 4 mg (4 mg Intravenous Given 04/28/17 1150)  acetaminophen (TYLENOL) tablet 650 mg (650 mg Oral Given 04/28/17 1337)  iopamidol (ISOVUE-300) 61 % injection 50 mL (50 mLs Oral Contrast Given 04/28/17 1315)  metoCLOPramide (REGLAN) injection 10 mg (10 mg Intravenous Given 04/28/17 1417)  iopamidol (ISOVUE-300) 61 % injection (100 mLs Intravenous Contrast Given 04/28/17 1548)     Initial Impression / Assessment and Plan / ED Course  I have reviewed the triage vital signs and the nursing notes.  Pertinent labs & imaging results that were available during my care of the patient were reviewed by me and considered in my medical decision making (see chart for details).      Patient feeling much better with nausea and pain after IV Zofran 4mg .  CBC Shows hemoglobin 11.3, which is much improved from 10 days ago. CMP shows creatinine 1.07, AST 45. Lipase 106. Lactate 1.41. UA shows 100 protein, small leukocytes, rare bacteria, 6-30 squamous epithelial cells. Urine culture sent, but suspect dirty sample. KUB with contrast shows PEG tube in place. Upon discharge, patient began vomiting. Considering patient's high risk and PEG tube in place, will order CT abdomen pelvis. CTAP shows Minimal edema surrounding the G-tube site, tip of tube is in the proximal jejunum; no acute abnormality found. Patient tolerating oral fluids prior to discharge. Discharge home with ODT Zofran and follow up to surgeon at appointment scheduled in 2 days. Return precautions discussed. Patient states recent plan. Patient vitals stable throughout ED course discharge in satisfactory condition. Patient will take daily medication upon arrival home. I discussed patient case with Dr. Tomi Bamberger who guided the patient's management and agrees with plan.     Final Clinical Impressions(s) / ED Diagnoses   Final diagnoses:  Periumbilical abdominal pain  Non-intractable vomiting with nausea, unspecified vomiting type    New Prescriptions New Prescriptions   ONDANSETRON (ZOFRAN ODT) 4 MG DISINTEGRATING TABLET    Take 1 tablet (4 mg total) by mouth every 8 (eight) hours as needed for nausea or vomiting.     Frederica Kuster, PA-C 04/28/17 1635    Dorie Rank, MD 04/29/17 0800

## 2017-04-30 ENCOUNTER — Encounter: Payer: Self-pay | Admitting: Physical Medicine & Rehabilitation

## 2017-04-30 ENCOUNTER — Encounter: Payer: 59 | Attending: Physical Medicine & Rehabilitation | Admitting: Physical Medicine & Rehabilitation

## 2017-04-30 VITALS — BP 137/91 | HR 93 | Resp 14

## 2017-04-30 DIAGNOSIS — R0989 Other specified symptoms and signs involving the circulatory and respiratory systems: Secondary | ICD-10-CM | POA: Diagnosis not present

## 2017-04-30 DIAGNOSIS — R269 Unspecified abnormalities of gait and mobility: Secondary | ICD-10-CM | POA: Insufficient documentation

## 2017-04-30 DIAGNOSIS — Z931 Gastrostomy status: Secondary | ICD-10-CM | POA: Diagnosis not present

## 2017-04-30 DIAGNOSIS — R7303 Prediabetes: Secondary | ICD-10-CM

## 2017-04-30 DIAGNOSIS — I609 Nontraumatic subarachnoid hemorrhage, unspecified: Secondary | ICD-10-CM | POA: Diagnosis not present

## 2017-04-30 DIAGNOSIS — I618 Other nontraumatic intracerebral hemorrhage: Secondary | ICD-10-CM | POA: Diagnosis not present

## 2017-04-30 DIAGNOSIS — K219 Gastro-esophageal reflux disease without esophagitis: Secondary | ICD-10-CM | POA: Insufficient documentation

## 2017-04-30 DIAGNOSIS — G441 Vascular headache, not elsewhere classified: Secondary | ICD-10-CM | POA: Diagnosis not present

## 2017-04-30 DIAGNOSIS — R531 Weakness: Secondary | ICD-10-CM | POA: Diagnosis present

## 2017-04-30 DIAGNOSIS — I69319 Unspecified symptoms and signs involving cognitive functions following cerebral infarction: Secondary | ICD-10-CM | POA: Diagnosis not present

## 2017-04-30 DIAGNOSIS — I10 Essential (primary) hypertension: Secondary | ICD-10-CM | POA: Insufficient documentation

## 2017-04-30 DIAGNOSIS — I607 Nontraumatic subarachnoid hemorrhage from unspecified intracranial artery: Secondary | ICD-10-CM

## 2017-04-30 DIAGNOSIS — I720 Aneurysm of carotid artery: Secondary | ICD-10-CM | POA: Diagnosis not present

## 2017-04-30 DIAGNOSIS — R51 Headache: Secondary | ICD-10-CM | POA: Diagnosis not present

## 2017-04-30 DIAGNOSIS — H539 Unspecified visual disturbance: Secondary | ICD-10-CM | POA: Insufficient documentation

## 2017-04-30 DIAGNOSIS — F4323 Adjustment disorder with mixed anxiety and depressed mood: Secondary | ICD-10-CM

## 2017-04-30 LAB — URINE CULTURE
Culture: >=100000 — AB
Special Requests: NORMAL

## 2017-04-30 NOTE — Patient Instructions (Signed)
Please follow up with Neurosurgery, Ophthalmology, PCP, General Surgery

## 2017-04-30 NOTE — Progress Notes (Addendum)
Subjective:    Patient ID: Carly [name], female    DOB: 20-Mar-1971, 46 y.o.   MRN: [id number]  HPI 46 y.o. right female with history of HTN, GERD, presents for transitional care management after receiving CIR for left frontal hemorrhage s/p coiling.  Admit date: 04/09/2017 Discharge date: 04/23/2017  Husband presents, who provides majority of history. At discharge, she as instructed to have supervision at all times, which she has.  Since discharge, she went to the ED with nausea/vomitting. That has resolved.  She was not able to see her PCP, has not rescheduled. She has not heard back from [facility] or Neurosurg or Surgery. Her headaches have significantly improved.  Her BP is controlled.  Denies falls. She has her last dose of abx on Sunday.    DME: Bedside commode Mobility: Walker most of the time, wheelchair for long distances Therapies: 2/week   Pain Inventory Average Pain 1 Pain Right Now 1 My pain is dull  In the last 24 hours, has pain interfered with the following? General activity 4 Relation with others 3 Enjoyment of life 2 What TIME of day is your pain at its worst? evening Sleep (in general) Fair  Pain is worse with: unsure Pain improves with: rest and medication Relief from Meds: 7  Mobility walk with assistance use a walker how many minutes can you walk? 2 ability to climb steps?  yes do you drive?  no needs help with transfers Do you have any goals in this area?  yes  Function not employed: date last employed .  Neuro/Psych trouble walking confusion loss of taste or smell  Prior Studies transitional care visit  Physicians involved in your care transitional care visit   Family History  Problem Relation Age of Onset  . Diabetes Mother   . Hypertension Mother   . Hyperlipidemia Mother   . Cancer Father        colon cancer  . Diabetes Maternal Grandmother   . Hyperlipidemia Paternal Grandfather    Social History   Social History  . Marital  status: Married    Spouse name: N/A  . Number of children: N/A  . Years of education: N/A   Social History Main Topics  . Smoking status: Never Smoker  . Smokeless tobacco: Never Used  . Alcohol use Yes     Comment: occ  . Drug use: No  . Sexual activity: Yes    Birth control/ protection: Other-see comments, IUD   Other Topics Concern  . None   Social History Narrative   Marital Status: Married [name])   Children:  Son (1) Daughter (1)    Pets: None   Living Situation: Lives with husband and children.   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 CESAREAN SECTION    . ESOPHAGOGASTRODUODENOSCOPY N/A 04/01/2017   Procedure: ESOPHAGOGASTRODUODENOSCOPY (EGD);  Surgeon: Georganna Skeans, MD;  Location: [place];  Service: General;  Laterality: N/A;  . IR ANGIO INTRA EXTRACRAN SEL INTERNAL CAROTID UNI R MOD SED  03/03/2017  . IR ANGIO VERTEBRAL SEL VERTEBRAL BILAT MOD SED  03/03/2017  . IR ANGIOGRAM FOLLOW UP STUDY  03/03/2017  . IR ANGIOGRAM FOLLOW UP STUDY  03/03/2017  . IR ANGIOGRAM FOLLOW UP STUDY  03/03/2017  . IR ANGIOGRAM FOLLOW UP STUDY  03/03/2017  . IR REPLC GASTRO/COLONIC TUBE PERCUT W/FLUORO  04/15/2017  . IR TRANSCATH/EMBOLIZ  03/03/2017  . PEG PLACEMENT N/A 04/01/2017   Procedure: PERCUTANEOUS ENDOSCOPIC GASTROSTOMY (PEG) PLACEMENT;  Surgeon: Georganna Skeans, MD;  Location: [place];  Service: General;  Laterality: N/A;  . RADIOLOGY WITH ANESTHESIA N/A 03/03/2017   Procedure: RADIOLOGY WITH ANESTHESIA;  Surgeon: Consuella Lose, MD;  Location: [place];  Service: Radiology;  Laterality: N/A;   Past Medical History:  Diagnosis Date  . GERD (gastroesophageal reflux disease)   . Hypertension   . Migraines    BP (!) 137/91 (BP Location: Left Arm,  Patient Position: Sitting, Cuff Size: Normal)   Pulse 93   Resp 14   SpO2 97%   Opioid Risk Score:   Fall Risk Score:  `1  Depression screen PHQ 2/9  Depression screen PHQ 2/9 04/30/2017  Decreased Interest 1  Down, Depressed, Hopeless 1  PHQ - 2 Score 2  Altered sleeping 2  Tired, decreased energy 2  Change in appetite 3  Feeling bad or failure about yourself  1  Trouble concentrating 3  Moving slowly or fidgety/restless 3  Suicidal thoughts 0  PHQ-9 Score 16  Difficult doing work/chores Somewhat difficult    Review of Systems  Constitutional: Positive for appetite change.  HENT:       Loss of taste  Eyes: Negative.   Respiratory: Positive for cough.   Cardiovascular: Negative.   Gastrointestinal: Positive for abdominal pain, diarrhea and vomiting.  Endocrine: Negative.   Genitourinary: Negative.   Musculoskeletal: Positive for gait problem.  Skin: Negative.   Allergic/Immunologic: Negative.   Hematological: Negative.   Psychiatric/Behavioral: Positive for confusion.       Objective:   Physical Exam Constitutional: She appears well-developed. Obese  HENT: Normocephalic and atraumatic.  Eyes: No discharge. EOMI. Cardiovascular: RRR. No JVD. Respiratory: Effort normal and breath sounds normal.  GI: Soft. Bowel sounds are normal. +PEG. No TTP. Musculoskeletal: She exhibits no edema or tenderness.  Neurological: She is alert and oriented x3. Does not make eye contact due to visual deficits Motor: B/l UE 4-4+/5 proximal to distal RLE: 4+/5 HF, KE, 5/5 ADF/PF  LLE: 4+/5 HF, KE, 5/5 ADF/PF  Skin: Skin is warm and dry. She is not diaphoretic.  Psychiatric: Her affect is blunt. Her speech is delayed. She is slowed. She does not express impulsivity. She is attentive.     Assessment & Plan:  46 y.o. right female with history of HTN, GERD, presents for transitional care management after receiving CIR for left frontal hemorrhage s/p coiling.  1. Generalized weakness  secondary to left frontal hemorrhage s/p coiling.  Cont therapies  Follow up with Neurosurg - needs appointment  2. HA  Relatively controlled at present  3. Labile blood pressure  Controlled today  Needs to make appointment with PCP  4. PEG status  Follow up with Surgery - needs appointment  5. Gait abnormality  Cont walker for safety  Cont therapies  6. Visual deficits  Pt needs appointment with Ophtho  Meds reviewed Referrals reviewed All questions answered - Husband with list of questions

## 2017-05-01 ENCOUNTER — Telehealth: Payer: Self-pay | Admitting: *Deleted

## 2017-05-01 NOTE — Telephone Encounter (Signed)
Post ED Visit - Positive Culture Follow-up  Culture report reviewed by antimicrobial stewardship pharmacist:  []  Carly [name], Pharm.D. []  Carly [name], Pharm.D., BCPS AQ-ID []  Carly [name], Pharm.D., BCPS []  Carly [name], Pharm.D., BCPS []  Carly [name], Pharm.D., BCPS, AAHIVP []  Carly [name], Pharm.D., BCPS, AAHIVP []  Carly [name], PharmD, BCPS []  Carly [name], PharmD, BCPS []  Carly [name], PharmD, BCPS Carly [name], PharmD  Positive urine culture, reviewed by Martinique Russo, PA-C, asymptomatic bacteruria, no abx indicated.   Harlon Flor [facility] 05/01/2017, 11:50 AM

## 2017-05-01 NOTE — Progress Notes (Signed)
ED Antimicrobial Stewardship Positive Culture Follow Up   Carly [name] is an 46 y.o. female who presented to [facility] on 04/28/2017 with a chief complaint of  Chief Complaint  Patient presents with  . Abdominal Pain    Recent Results (from the past 720 hour(s))  Culture, blood (routine x 2)     Status: None   Collection Time: 04/13/17  6:34 PM  Result Value Ref Range Status   Specimen Description BLOOD LEFT ANTECUBITAL  Final   Special Requests IN PEDIATRIC BOTTLE Blood Culture adequate volume  Final   Culture NO GROWTH 5 DAYS  Final   Report Status 04/18/2017 FINAL  Final  Culture, blood (routine x 2)     Status: None   Collection Time: 04/13/17  6:37 PM  Result Value Ref Range Status   Specimen Description BLOOD RIGHT ANTECUBITAL  Final   Special Requests   Final    BOTTLES DRAWN AEROBIC AND ANAEROBIC Blood Culture adequate volume   Culture NO GROWTH 5 DAYS  Final   Report Status 04/18/2017 FINAL  Final  Culture, Urine     Status: Abnormal   Collection Time: 04/14/17  2:49 PM  Result Value Ref Range Status   Specimen Description URINE, CLEAN CATCH  Final   Special Requests vancomycin, zosyn  Final   Culture <10,000 COLONIES/mL INSIGNIFICANT GROWTH (A)  Final   Report Status 04/16/2017 FINAL  Final  Urine culture     Status: Abnormal   Collection Time: 04/28/17  9:30 AM  Result Value Ref Range Status   Specimen Description URINE, CLEAN CATCH  Final   Special Requests Normal  Final   Culture >=100,000 COLONIES/mL ESCHERICHIA COLI (A)  Final   Report Status 04/30/2017 FINAL  Final   Organism ID, Bacteria ESCHERICHIA COLI (A)  Final      Susceptibility   Escherichia coli - MIC*    AMPICILLIN >=32 RESISTANT Resistant     CEFAZOLIN 8 SENSITIVE Sensitive     CEFTRIAXONE <=1 SENSITIVE Sensitive     CIPROFLOXACIN <=0.25 SENSITIVE Sensitive     GENTAMICIN <=1 SENSITIVE Sensitive     IMIPENEM <=0.25 SENSITIVE Sensitive     NITROFURANTOIN <=16 SENSITIVE Sensitive    TRIMETH/SULFA <=20 SENSITIVE Sensitive     AMPICILLIN/SULBACTAM >=32 RESISTANT Resistant     PIP/TAZO <=4 SENSITIVE Sensitive     Extended ESBL NEGATIVE Sensitive     * >=100,000 COLONIES/mL ESCHERICHIA COLI     Plan: Asymptomatic bacteruria. Does not require treatment with antibiotics.  ED Provider: Martinique Russo, PA-C   [name], PharmD PGY1 Pharmacy Resident Pager: [phone]

## 2017-05-02 ENCOUNTER — Emergency Department (HOSPITAL_BASED_OUTPATIENT_CLINIC_OR_DEPARTMENT_OTHER)
Admission: EM | Admit: 2017-05-02 | Discharge: 2017-05-02 | Disposition: A | Discharge status: Home / Self Care | Payer: 59 | Attending: Emergency Medicine | Admitting: Emergency Medicine

## 2017-05-02 ENCOUNTER — Encounter (HOSPITAL_BASED_OUTPATIENT_CLINIC_OR_DEPARTMENT_OTHER): Payer: Self-pay

## 2017-05-02 DIAGNOSIS — Z79899 Other long term (current) drug therapy: Secondary | ICD-10-CM | POA: Insufficient documentation

## 2017-05-02 DIAGNOSIS — I10 Essential (primary) hypertension: Secondary | ICD-10-CM | POA: Diagnosis present

## 2017-05-02 DIAGNOSIS — R0989 Other specified symptoms and signs involving the circulatory and respiratory systems: Secondary | ICD-10-CM | POA: Diagnosis not present

## 2017-05-02 NOTE — ED Triage Notes (Signed)
Pt with recent admn for brain aneurysm-home PT was concerned with pt BP today and advised her to come to ED_pt NAD-presents to triage in w/c

## 2017-05-02 NOTE — ED Provider Notes (Signed)
[place] DEPT [facility] Provider Note   CSN: [id number] Arrival date & time: 05/02/17  1124     History   Chief Complaint Chief Complaint  Patient presents with  . Hypertension    HPI Carly [name] is a 46 y.o. female.  Patient is a 46yo F with PMH HTN, GERD, recent ruptured intracranial aneurysm s/p coiling who was sent to ED for elevated blood pressure. Patient has had labile blood pressure since her hospital admission. States was feeling well when PT took her BP this morning and found it to be elevated to 170/126. Patient was told to come to ED for evaluation. Has continued to feel well, no concerns.      Past Medical History:  Diagnosis Date  . GERD (gastroesophageal reflux disease)   . Hypertension   . Migraines     Patient Active Problem List   Diagnosis Date Noted  . AKI (acute kidney injury) ([place])   . Adjustment disorder with mixed anxiety and depressed mood   . Dislodged gastrostomy tube ([place])   . Sepsis ([place])   . Hypokalemia   . Elevated temperature   . SIRS (systemic inflammatory response syndrome) ([facility])   . Decreased oral intake   . Cognitive deficit, post-stroke   . Prediabetes   . Labile blood pressure   . Transaminitis   . 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 Hydrocephalus   . Level of consciousness decreased   . SAH (subarachnoid hemorrhage) ([place])   . Tachycardia   . PEG (percutaneous endoscopic gastrostomy) status ([place])   . Acute blood loss anemia   . Tachypnea   . Ventilator dependent ([place])   . Palliative care by specialist   . Electrolyte imbalance 03/19/2017  . History of ETT   . Encounter for intubation   . Subarachnoid hemorrhage ([place])   . Acute respiratory failure ([place])   . Ruptured cerebral aneurysm ([place]) 03/03/2017  . Routine general medical examination at a health care facility 08/23/2013  . Essential hypertension, benign 08/23/2013  . Impaired fasting glucose 08/23/2013  . Plantar fasciitis, bilateral 08/23/2013    . Unspecified vitamin D deficiency 08/23/2013  . Obesity, unspecified 08/23/2013  . Unspecified essential hypertension 12/31/2012  . GERD (gastroesophageal reflux disease) 12/31/2012  . COMMON MIGRAINE 06/16/2008  . CHEST PAIN, INTERMITTENT 06/16/2008    Past Surgical History:  Procedure Laterality Date  . CESAREAN SECTION    . ESOPHAGOGASTRODUODENOSCOPY N/A 04/01/2017   Procedure: ESOPHAGOGASTRODUODENOSCOPY (EGD);  Surgeon: Georganna Skeans, MD;  Location: [place];  Service: General;  Laterality: N/A;  . IR ANGIO INTRA EXTRACRAN SEL INTERNAL CAROTID UNI R MOD SED  03/03/2017  . IR ANGIO VERTEBRAL SEL VERTEBRAL BILAT MOD SED  03/03/2017  . IR ANGIOGRAM FOLLOW UP STUDY  03/03/2017  . IR ANGIOGRAM FOLLOW UP STUDY  03/03/2017  . IR ANGIOGRAM FOLLOW UP STUDY  03/03/2017  . IR ANGIOGRAM FOLLOW UP STUDY  03/03/2017  . IR REPLC GASTRO/COLONIC TUBE PERCUT W/FLUORO  04/15/2017  . IR TRANSCATH/EMBOLIZ  03/03/2017  . PEG PLACEMENT N/A 04/01/2017   Procedure: PERCUTANEOUS ENDOSCOPIC GASTROSTOMY (PEG) PLACEMENT;  Surgeon: Georganna Skeans, MD;  Location: [place];  Service: General;  Laterality: N/A;  . RADIOLOGY WITH ANESTHESIA N/A 03/03/2017   Procedure: RADIOLOGY WITH ANESTHESIA;  Surgeon: Consuella Lose, MD;  Location: [place];  Service: Radiology;  Laterality: N/A;    OB History    No data available       Home Medications    Prior to Admission medications  Medication Sig Start Date End Date Taking? Authorizing Provider  cephALEXin (KEFLEX) 500 MG capsule Take 1 capsule (500 mg total) by mouth every 6 (six) hours. 04/23/17   Love, Ivan Anchors, PA-C  levETIRAcetam (KEPPRA) 1000 MG tablet Take 1 tablet (1,000 mg total) by mouth 2 (two) times daily. 04/23/17   Love, Ivan Anchors, PA-C  metoCLOPramide (REGLAN) 5 MG tablet Take 1 tablet (5 mg total) by mouth daily. 04/24/17   Love, Ivan Anchors, PA-C  Multiple Vitamin (MULTIVITAMIN WITH MINERALS) TABS tablet Take 1 tablet by mouth daily. 04/24/17   Love,  Ivan Anchors, PA-C  ondansetron (ZOFRAN ODT) 4 MG disintegrating tablet Take 1 tablet (4 mg total) by mouth every 8 (eight) hours as needed for nausea or vomiting. 04/28/17   Law, Bea Graff, PA-C  pantoprazole (PROTONIX) 40 MG tablet Take 1 tablet (40 mg total) by mouth daily. 04/24/17   Bary Leriche, PA-C    Family History Family History  Problem Relation Age of Onset  . Diabetes Mother   . Hypertension Mother   . Hyperlipidemia Mother   . Cancer Father        colon cancer  . Diabetes Maternal Grandmother   . Hyperlipidemia Paternal Grandfather     Social History Social History  Substance Use Topics  . Smoking status: Never Smoker  . Smokeless tobacco: Never Used  . Alcohol use Yes     Comment: occ     Allergies   Patient has no known allergies.   Review of Systems Review of Systems  Constitutional: Negative for activity change.  Eyes: Negative for pain and visual disturbance (no new vision changes).  Respiratory: Negative for chest tightness.   Cardiovascular: Negative for chest pain.  Gastrointestinal: Negative for abdominal pain.  Neurological: Negative for dizziness, speech difficulty, weakness, light-headedness, numbness and headaches.  Psychiatric/Behavioral: Negative for confusion.     Physical Exam Updated Vital Signs BP 135/87 (BP Location: Left Arm)   Pulse 94   Temp 99.1 F (37.3 C) (Oral)   Resp 18   SpO2 100%   Physical Exam  Constitutional: She is oriented to person, place, and time. She appears well-developed and well-nourished. No distress.  HENT:  Head: Normocephalic and atraumatic.  Nose: Nose normal.  Eyes: Pupils are equal, round, and reactive to light. Conjunctivae and EOM are normal.  Neck: Normal range of motion. Neck supple.  Cardiovascular: Normal rate, regular rhythm, normal heart sounds and intact distal pulses.   No murmur heard. Pulmonary/Chest: Effort normal and breath sounds normal. No respiratory distress.  Abdominal: Soft.  Bowel sounds are normal. She exhibits no distension. There is no tenderness. There is no rebound and no guarding.  PEG tube in place  Musculoskeletal: Normal range of motion. She exhibits no edema.  Neurological: She is alert and oriented to person, place, and time. She displays normal reflexes. No cranial nerve deficit or sensory deficit. She exhibits normal muscle tone. Coordination normal.  Skin: Skin is warm and dry. Capillary refill takes less than 2 seconds.  Psychiatric: She has a normal mood and affect.     ED Treatments / Results  Labs (all labs ordered are listed, but only abnormal results are displayed) Labs Reviewed - No data to display  EKG  EKG Interpretation None       Radiology No results found.  Procedures Procedures (including critical care time)  Medications Ordered in ED Medications - No data to display   Initial Impression / Assessment and Plan / ED  Course  I have reviewed the triage vital signs and the nursing notes.  Pertinent labs & imaging results that were available during my care of the patient were reviewed by me and considered in my medical decision making (see chart for details).   Patient is a 46yo F with recent admission for ruptured intracranial aneurysm s/p coiling in July whose hospital course was complicated and required CIR. She was sent here by PT today for elevated BP 170/126 thought per chart review she has had very labile blood pressures as outpatient. Her BP here in ED is normalized. Discussed getting home monitor and will give patient contact information to establish with new PCP since patient's current PCP is out on medical leave and she would like to establish with a new doctor.   Final Clinical Impressions(s) / ED Diagnoses   Final diagnoses:  Labile blood pressure    New Prescriptions Discharge Medication List as of 05/02/2017 12:19 PM       Bufford Lope, DO 05/02/17 1319    Tanna Furry, MD 05/10/17 [phone]

## 2017-05-02 NOTE — Discharge Instructions (Signed)
With your blood pressures being up and down, it may be worth getting a home monitor to see if persistently elevated. Please follow up with your primary care doctor's office, contact information is below for a good place to go.

## 2017-05-07 ENCOUNTER — Other Ambulatory Visit: Payer: Self-pay

## 2017-05-07 NOTE — Patient Outreach (Signed)
[facility]) Care Management  05/07/2017  VEEDA VIRGO Mar 06, 1971 [id number]      EMMI- STROKE RED ON EMMI ALERT Day # 13 Date: 05/06/17 Red Alert Reason: " Questions/problems with meds? Yes"   "Problems refilling meds? Yes"   Outreach attempt # 1 to patient. Spouse answered the phone and requested call be competed with him. ROI on file. Spouse voices that he has been answering automated EMMI calls. Reviewed and addressed red alerts with spouse. He states that patient goes for PCP f/u appt today and he had already planned to review and address issues. He states that patient will need refills on meds is why he responded to automated questions that way. Otherwise, spouse voices patient is doing okay. She is getting [facility] services to assist with her care needs. He voices no further RN CM needs or concerns. Advised spouse that they have completed EMMI-Stroke post discharge automated calls.       Plan: RN CM will notify [facility] administrative assistant of case status.    Enzo Montgomery, RN,BSN,CCM [facility] Telephonic Care Management Coordinator Direct Phone: [phone] Toll Free: [phone] Fax: [phone]

## 2017-05-07 NOTE — Progress Notes (Signed)
This encounter was created in error - please disregard.

## 2017-05-07 NOTE — Addendum Note (Signed)
Addended by: Virgel Manifold on: 05/07/2017 09:35 AM   Modules accepted: Level of Service, SmartSet

## 2017-05-09 ENCOUNTER — Telehealth: Payer: Self-pay | Admitting: Physical Medicine & Rehabilitation

## 2017-05-09 NOTE — Telephone Encounter (Signed)
Iris Pert with [facility] called and states [facility] PT is running out for home that you prescribed at discharge - she does not have appointment with dr zanard (sp?) until next week - can you please call and give her a verbal order so the patient does not have any change in schedule - she is progressing nicely and doesn't want to lose progress

## 2017-05-09 NOTE — Telephone Encounter (Signed)
Physical therapist called back asking for [facility] PT order 1-3week3-4. Returned call and gave orders per office protocol

## 2017-05-09 NOTE — Telephone Encounter (Signed)
Called Carly [name] back for more clarification on what kind of orders she needs for this patient, waiting for return call

## 2017-05-23 ENCOUNTER — Telehealth: Payer: Self-pay | Admitting: *Deleted

## 2017-05-23 NOTE — Telephone Encounter (Signed)
Almyra Free ST with [facility] called for ext on ST 2wk2.  Approval given

## 2017-05-27 ENCOUNTER — Encounter (HOSPITAL_BASED_OUTPATIENT_CLINIC_OR_DEPARTMENT_OTHER): Payer: Self-pay | Admitting: *Deleted

## 2017-05-27 ENCOUNTER — Emergency Department (HOSPITAL_BASED_OUTPATIENT_CLINIC_OR_DEPARTMENT_OTHER): Payer: 59

## 2017-05-27 ENCOUNTER — Emergency Department (HOSPITAL_BASED_OUTPATIENT_CLINIC_OR_DEPARTMENT_OTHER)
Admission: EM | Admit: 2017-05-27 | Discharge: 2017-05-27 | Disposition: A | Discharge status: Home / Self Care | Payer: 59 | Attending: Emergency Medicine | Admitting: Emergency Medicine

## 2017-05-27 DIAGNOSIS — Z79899 Other long term (current) drug therapy: Secondary | ICD-10-CM | POA: Diagnosis not present

## 2017-05-27 DIAGNOSIS — Z8679 Personal history of other diseases of the circulatory system: Secondary | ICD-10-CM | POA: Diagnosis not present

## 2017-05-27 DIAGNOSIS — R51 Headache: Secondary | ICD-10-CM | POA: Diagnosis not present

## 2017-05-27 DIAGNOSIS — I10 Essential (primary) hypertension: Secondary | ICD-10-CM | POA: Insufficient documentation

## 2017-05-27 LAB — BASIC METABOLIC PANEL
Anion gap: 8 (ref 5–15)
BUN: 5 mg/dL — AB (ref 6–20)
CO2: 27 mmol/L (ref 22–32)
CREATININE: 0.88 mg/dL (ref 0.44–1.00)
Calcium: 9 mg/dL (ref 8.9–10.3)
Chloride: 103 mmol/L (ref 101–111)
GFR calc Af Amer: >60 mL/min (ref >60)
GLUCOSE: 112 mg/dL — AB (ref 65–99)
POTASSIUM: 3 mmol/L — AB (ref 3.5–5.1)
Sodium: 138 mmol/L (ref 135–145)

## 2017-05-27 LAB — CBC WITH DIFFERENTIAL/PLATELET
BASOS ABS: 0 10*3/uL (ref 0.0–0.1)
Basophils Relative: 0 %
Eosinophils Absolute: 0.1 10*3/uL (ref 0.0–0.7)
Eosinophils Relative: 1 %
HEMATOCRIT: 33.8 % — AB (ref 36.0–46.0)
Hemoglobin: 11.1 g/dL — ABNORMAL LOW (ref 12.0–15.0)
LYMPHS ABS: 1.9 10*3/uL (ref 0.7–4.0)
Lymphocytes Relative: 30 %
MCH: 25.6 pg — ABNORMAL LOW (ref 26.0–34.0)
MCHC: 32.8 g/dL (ref 30.0–36.0)
MCV: 77.9 fL — AB (ref 78.0–100.0)
MONOS PCT: 10 %
Monocytes Absolute: 0.6 10*3/uL (ref 0.1–1.0)
NEUTROS ABS: 3.8 10*3/uL (ref 1.7–7.7)
Neutrophils Relative %: 59 %
Platelets: 200 10*3/uL (ref 150–400)
RBC: 4.34 MIL/uL (ref 3.87–5.11)
RDW: 14.7 % (ref 11.5–15.5)
WBC: 6.4 10*3/uL (ref 4.0–10.5)

## 2017-05-27 NOTE — ED Provider Notes (Signed)
[facility] DEPT [facility] Provider Note   CSN: [id number] Arrival date & time: 05/27/17  0017     History   Chief Complaint Chief Complaint  Patient presents with  . Hypertension    HPI Carly [name] is a 46 y.o. female.  Patient is a 46 year old female with history of subarachnoid hemorrhage related to a ruptured cerebral aneurysm. She had a prolonged, complicated hospital course requiring intubation and PEG tube. She presents tonight for evaluation of headache and elevated blood pressure. She was previously on Keppra, however has not taken it over the weekend. She got the prescription filled this morning and took a dose of it and is now feeling worse. She denies any new injury or trauma. Home health has recorded elevated blood pressures at home. She has been on blood pressure medication in the past, however this was stopped for unknown reasons.   The history is provided by the patient.  Hypertension  This is a new problem. The current episode started 2 days ago. The problem occurs constantly. Associated symptoms include headaches. Pertinent negatives include no chest pain and no shortness of breath. Nothing aggravates the symptoms. Nothing relieves the symptoms. She has tried nothing for the symptoms.    Past Medical History:  Diagnosis Date  . GERD (gastroesophageal reflux disease)   . Hypertension   . Migraines     Patient Active Problem List   Diagnosis Date Noted  . AKI (acute kidney injury) ([place])   . Adjustment disorder with mixed anxiety and depressed mood   . Dislodged gastrostomy tube ([place])   . Sepsis ([place])   . Hypokalemia   . Elevated temperature   . SIRS (systemic inflammatory response syndrome) ([facility])   . Decreased oral intake   . Cognitive deficit, post-stroke   . Prediabetes   . Labile blood pressure   . Transaminitis   . 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 Hydrocephalus   . Level of consciousness decreased   . SAH (subarachnoid hemorrhage)  ([place])   . Tachycardia   . PEG (percutaneous endoscopic gastrostomy) status ([place])   . Acute blood loss anemia   . Tachypnea   . Ventilator dependent ([place])   . Palliative care by specialist   . Electrolyte imbalance 03/19/2017  . History of ETT   . Encounter for intubation   . Subarachnoid hemorrhage ([place])   . Acute respiratory failure ([place])   . Ruptured cerebral aneurysm ([place]) 03/03/2017  . Routine general medical examination at a health care facility 08/23/2013  . Essential hypertension, benign 08/23/2013  . Impaired fasting glucose 08/23/2013  . Plantar fasciitis, bilateral 08/23/2013  . Unspecified vitamin D deficiency 08/23/2013  . Obesity, unspecified 08/23/2013  . Unspecified essential hypertension 12/31/2012  . GERD (gastroesophageal reflux disease) 12/31/2012  . COMMON MIGRAINE 06/16/2008  . CHEST PAIN, INTERMITTENT 06/16/2008    Past Surgical History:  Procedure Laterality Date  . CESAREAN SECTION    . ESOPHAGOGASTRODUODENOSCOPY N/A 04/01/2017   Procedure: ESOPHAGOGASTRODUODENOSCOPY (EGD);  Surgeon: Georganna Skeans, MD;  Location: [facility];  Service: General;  Laterality: N/A;  . IR ANGIO INTRA EXTRACRAN SEL INTERNAL CAROTID UNI R MOD SED  03/03/2017  . IR ANGIO VERTEBRAL SEL VERTEBRAL BILAT MOD SED  03/03/2017  . IR ANGIOGRAM FOLLOW UP STUDY  03/03/2017  . IR ANGIOGRAM FOLLOW UP STUDY  03/03/2017  . IR ANGIOGRAM FOLLOW UP STUDY  03/03/2017  . IR ANGIOGRAM FOLLOW UP STUDY  03/03/2017  . IR REPLC GASTRO/COLONIC TUBE PERCUT W/FLUORO  04/15/2017  .  IR TRANSCATH/EMBOLIZ  03/03/2017  . PEG PLACEMENT N/A 04/01/2017   Procedure: PERCUTANEOUS ENDOSCOPIC GASTROSTOMY (PEG) PLACEMENT;  Surgeon: Georganna Skeans, MD;  Location: [facility];  Service: General;  Laterality: N/A;  . RADIOLOGY WITH ANESTHESIA N/A 03/03/2017   Procedure: RADIOLOGY WITH ANESTHESIA;  Surgeon: Consuella Lose, MD;  Location: [facility];  Service: Radiology;  Laterality: N/A;    OB History    No data available        Home Medications    Prior to Admission medications   Medication Sig Start Date End Date Taking? Authorizing Provider  levETIRAcetam (KEPPRA) 1000 MG tablet Take 1 tablet (1,000 mg total) by mouth 2 (two) times daily. 04/23/17   Love, Ivan Anchors, PA-C  metoCLOPramide (REGLAN) 5 MG tablet Take 1 tablet (5 mg total) by mouth daily. 04/24/17   Love, Ivan Anchors, PA-C  Multiple Vitamin (MULTIVITAMIN WITH MINERALS) TABS tablet Take 1 tablet by mouth daily. 04/24/17   Love, Ivan Anchors, PA-C  ondansetron (ZOFRAN ODT) 4 MG disintegrating tablet Take 1 tablet (4 mg total) by mouth every 8 (eight) hours as needed for nausea or vomiting. 04/28/17   Law, Bea Graff, PA-C  pantoprazole (PROTONIX) 40 MG tablet Take 1 tablet (40 mg total) by mouth daily. 04/24/17   Bary Leriche, PA-C    Family History Family History  Problem Relation Age of Onset  . Diabetes Mother   . Hypertension Mother   . Hyperlipidemia Mother   . Cancer Father        colon cancer  . Diabetes Maternal Grandmother   . Hyperlipidemia Paternal Grandfather     Social History Social History  Substance Use Topics  . Smoking status: Never Smoker  . Smokeless tobacco: Never Used  . Alcohol use Yes     Comment: occ     Allergies   Patient has no known allergies.   Review of Systems Review of Systems  Respiratory: Negative for shortness of breath.   Cardiovascular: Negative for chest pain.  Neurological: Positive for headaches.  All other systems reviewed and are negative.    Physical Exam Updated Vital Signs BP (!) 157/94   Pulse 77   Temp 98.5 F (36.9 C)   Resp 18   Ht 5\' 3"  (1.6 m)   Wt 92.5 kg (204 lb)   LMP 02/24/2017   SpO2 97%   BMI 36.14 kg/m   Physical Exam  Constitutional: She is oriented to person, place, and time. She appears well-developed and well-nourished. No distress.  HENT:  Head: Normocephalic and atraumatic.  Eyes: Pupils are equal, round, and reactive to light.  Neck: Normal range of  motion. Neck supple.  Cardiovascular: Normal rate and regular rhythm.  Exam reveals no gallop and no friction rub.   No murmur heard. Pulmonary/Chest: Effort normal and breath sounds normal. No respiratory distress. She has no wheezes.  Abdominal: Soft. Bowel sounds are normal. She exhibits no distension. There is no tenderness.  Musculoskeletal: Normal range of motion.  Neurological: She is alert and oriented to person, place, and time. A cranial nerve deficit is present. She exhibits normal muscle tone. Coordination normal.  Patient has a central visual field cut. She has difficulty visualizing objects in the center of her vision, however patient improves at the periphery.  Skin: Skin is warm and dry. She is not diaphoretic.  Nursing note and vitals reviewed.    ED Treatments / Results  Labs (all labs ordered are listed, but only abnormal results are displayed) Labs  Reviewed  BASIC METABOLIC PANEL  CBC WITH DIFFERENTIAL/PLATELET    EKG  EKG Interpretation None       Radiology No results found.  Procedures Procedures (including critical care time)  Medications Ordered in ED Medications - No data to display   Initial Impression / Assessment and Plan / ED Course  I have reviewed the triage vital signs and the nursing notes.  Pertinent labs & imaging results that were available during my care of the patient were reviewed by me and considered in my medical decision making (see chart for details).  Patient with history of cerebral aneurysm presenting with complaints of elevated blood pressure. She also reports not feeling well after taking her this morning. CT shows no recurrent bleed and laboratory studies are reassuring. Her blood pressure is now in the 248L systolic. I see no indication for any emergent intervention. I will have her keep a record of her blood pressures and follow up with her primary doctor.  Final Clinical Impressions(s) / ED Diagnoses   Final diagnoses:   None    New Prescriptions New Prescriptions   No medications on file     Veryl Speak, MD 05/27/17 [phone]

## 2017-05-27 NOTE — Discharge Instructions (Signed)
Keep a record of your blood pressures over the next several days and follow this up with your primary doctor.  Continue other medications as previously prescribed.

## 2017-05-27 NOTE — ED Triage Notes (Signed)
Pt c/o increased BP x x 6 hrs

## 2017-05-29 ENCOUNTER — Encounter: Payer: 59 | Admitting: Physical Medicine & Rehabilitation

## 2017-06-05 ENCOUNTER — Ambulatory Visit: Payer: 59 | Admitting: Physical Medicine & Rehabilitation

## 2017-06-26 ENCOUNTER — Encounter: Payer: 59 | Attending: Physical Medicine & Rehabilitation | Admitting: Physical Medicine & Rehabilitation

## 2017-06-26 DIAGNOSIS — R269 Unspecified abnormalities of gait and mobility: Secondary | ICD-10-CM | POA: Insufficient documentation

## 2017-06-26 DIAGNOSIS — I10 Essential (primary) hypertension: Secondary | ICD-10-CM | POA: Insufficient documentation

## 2017-06-26 DIAGNOSIS — Z931 Gastrostomy status: Secondary | ICD-10-CM | POA: Insufficient documentation

## 2017-06-26 DIAGNOSIS — H539 Unspecified visual disturbance: Secondary | ICD-10-CM | POA: Insufficient documentation

## 2017-06-26 DIAGNOSIS — R531 Weakness: Secondary | ICD-10-CM | POA: Insufficient documentation

## 2017-06-26 DIAGNOSIS — R51 Headache: Secondary | ICD-10-CM | POA: Insufficient documentation

## 2017-06-26 DIAGNOSIS — I618 Other nontraumatic intracerebral hemorrhage: Secondary | ICD-10-CM | POA: Insufficient documentation

## 2017-06-26 DIAGNOSIS — R0989 Other specified symptoms and signs involving the circulatory and respiratory systems: Secondary | ICD-10-CM | POA: Insufficient documentation

## 2017-06-26 DIAGNOSIS — K219 Gastro-esophageal reflux disease without esophagitis: Secondary | ICD-10-CM | POA: Insufficient documentation

## 2017-07-15 ENCOUNTER — Telehealth: Payer: Self-pay

## 2017-07-15 NOTE — Telephone Encounter (Signed)
Patient called today, wanting to know if doctor patel can sign forms for her in reguards to her injury/ailment, patient not seen since august and noshowed/cancelled last 3 appointments.  Patient requesting call back

## 2017-07-16 NOTE — Telephone Encounter (Signed)
There are some things she needs follow up for and I would like to see her regardless of any forms.  When she comes in for the appointment we can discuss forms at that time.  Thanks.

## 2017-08-30 DIAGNOSIS — F4323 Adjustment disorder with mixed anxiety and depressed mood: Secondary | ICD-10-CM

## 2017-08-30 DIAGNOSIS — I69319 Unspecified symptoms and signs involving cognitive functions following cerebral infarction: Secondary | ICD-10-CM

## 2017-08-30 DIAGNOSIS — F4321 Adjustment disorder with depressed mood: Secondary | ICD-10-CM

## 2017-08-30 DIAGNOSIS — I609 Nontraumatic subarachnoid hemorrhage, unspecified: Secondary | ICD-10-CM

## 2017-08-30 HISTORY — DX: Adjustment disorder with depressed mood: F43.21

## 2018-02-02 ENCOUNTER — Other Ambulatory Visit: Payer: Self-pay | Admitting: Neurosurgery

## 2018-02-02 DIAGNOSIS — I609 Nontraumatic subarachnoid hemorrhage, unspecified: Secondary | ICD-10-CM

## 2018-02-05 ENCOUNTER — Other Ambulatory Visit: Payer: Self-pay | Admitting: Neurosurgery

## 2018-02-10 ENCOUNTER — Ambulatory Visit (HOSPITAL_COMMUNITY)
Admission: RE | Admit: 2018-02-10 | Discharge: 2018-02-10 | Disposition: A | Discharge status: Home / Self Care | Payer: 59 | Source: Ambulatory Visit | Attending: Neurosurgery | Admitting: Neurosurgery

## 2018-02-10 ENCOUNTER — Other Ambulatory Visit: Payer: Self-pay | Admitting: Neurosurgery

## 2018-02-10 ENCOUNTER — Encounter (HOSPITAL_COMMUNITY): Payer: Self-pay

## 2018-02-10 DIAGNOSIS — I609 Nontraumatic subarachnoid hemorrhage, unspecified: Secondary | ICD-10-CM

## 2018-02-10 DIAGNOSIS — K219 Gastro-esophageal reflux disease without esophagitis: Secondary | ICD-10-CM | POA: Insufficient documentation

## 2018-02-10 DIAGNOSIS — I10 Essential (primary) hypertension: Secondary | ICD-10-CM | POA: Diagnosis not present

## 2018-02-10 DIAGNOSIS — Z48812 Encounter for surgical aftercare following surgery on the circulatory system: Secondary | ICD-10-CM | POA: Diagnosis present

## 2018-02-10 DIAGNOSIS — G43909 Migraine, unspecified, not intractable, without status migrainosus: Secondary | ICD-10-CM | POA: Insufficient documentation

## 2018-02-10 DIAGNOSIS — Z8774 Personal history of (corrected) congenital malformations of heart and circulatory system: Secondary | ICD-10-CM | POA: Diagnosis present

## 2018-02-10 HISTORY — PX: IR ANGIO VERTEBRAL SEL SUBCLAVIAN INNOMINATE BILAT MOD SED: IMG5366

## 2018-02-10 HISTORY — PX: IR ANGIO VERTEBRAL SEL VERTEBRAL BILAT MOD SED: IMG5369

## 2018-02-10 LAB — BASIC METABOLIC PANEL
ANION GAP: 6 (ref 5–15)
BUN: 12 mg/dL (ref 6–20)
CALCIUM: 8.9 mg/dL (ref 8.9–10.3)
CHLORIDE: 108 mmol/L (ref 101–111)
CO2: 28 mmol/L (ref 22–32)
CREATININE: 1.11 mg/dL — AB (ref 0.44–1.00)
GFR calc Af Amer: >60 mL/min (ref >60)
GFR calc non Af Amer: 58 mL/min — ABNORMAL LOW (ref >60)
GLUCOSE: 97 mg/dL (ref 65–99)
Potassium: 3.8 mmol/L (ref 3.5–5.1)
Sodium: 142 mmol/L (ref 135–145)

## 2018-02-10 LAB — URINALYSIS, ROUTINE W REFLEX MICROSCOPIC
BILIRUBIN URINE: NEGATIVE
GLUCOSE, UA: NEGATIVE mg/dL
HGB URINE DIPSTICK: NEGATIVE
Ketones, ur: NEGATIVE mg/dL
Leukocytes, UA: NEGATIVE
Nitrite: NEGATIVE
Protein, ur: NEGATIVE mg/dL
Specific Gravity, Urine: 1.013 (ref 1.005–1.030)
pH: 6 (ref 5.0–8.0)

## 2018-02-10 LAB — CBC WITH DIFFERENTIAL/PLATELET
ABS IMMATURE GRANULOCYTES: 0 10*3/uL (ref 0.0–0.1)
BASOS PCT: 0 %
Basophils Absolute: 0 10*3/uL (ref 0.0–0.1)
Eosinophils Absolute: 0.1 10*3/uL (ref 0.0–0.7)
Eosinophils Relative: 1 %
HEMATOCRIT: 37.5 % (ref 36.0–46.0)
Hemoglobin: 12.2 g/dL (ref 12.0–15.0)
IMMATURE GRANULOCYTES: 0 %
LYMPHS ABS: 2.3 10*3/uL (ref 0.7–4.0)
Lymphocytes Relative: 35 %
MCH: 27.1 pg (ref 26.0–34.0)
MCHC: 32.5 g/dL (ref 30.0–36.0)
MCV: 83.1 fL (ref 78.0–100.0)
MONO ABS: 0.6 10*3/uL (ref 0.1–1.0)
Monocytes Relative: 9 %
NEUTROS ABS: 3.6 10*3/uL (ref 1.7–7.7)
NEUTROS PCT: 55 %
Platelets: 216 10*3/uL (ref 150–400)
RBC: 4.51 MIL/uL (ref 3.87–5.11)
RDW: 14 % (ref 11.5–15.5)
WBC: 6.6 10*3/uL (ref 4.0–10.5)

## 2018-02-10 LAB — APTT: aPTT: 25 seconds (ref 24–36)

## 2018-02-10 LAB — PROTIME-INR
INR: 0.96
PROTHROMBIN TIME: 12.7 s (ref 11.4–15.2)

## 2018-02-10 LAB — PREGNANCY, URINE: Preg Test, Ur: NEGATIVE

## 2018-02-10 MED ORDER — IOPAMIDOL (ISOVUE-300) INJECTION 61%
INTRAVENOUS | Status: AC
  Administered 2018-02-10: 50 mL
  Filled 2018-02-10: qty 100

## 2018-02-10 MED ORDER — FENTANYL CITRATE (PF) 100 MCG/2ML IJ SOLN
INTRAMUSCULAR | Status: AC
  Filled 2018-02-10: qty 2

## 2018-02-10 MED ORDER — MIDAZOLAM HCL 2 MG/2ML IJ SOLN
INTRAMUSCULAR | Status: AC
  Filled 2018-02-10: qty 2

## 2018-02-10 MED ORDER — HEPARIN SODIUM (PORCINE) 1000 UNIT/ML IJ SOLN
INTRAMUSCULAR | Status: AC | PRN
  Administered 2018-02-10: 2000 [IU] via INTRAVENOUS

## 2018-02-10 MED ORDER — HYDROCODONE-ACETAMINOPHEN 5-325 MG PO TABS: 1.0000 | ORAL_TABLET | ORAL | Status: DC | PRN

## 2018-02-10 MED ORDER — HEPARIN SODIUM (PORCINE) 1000 UNIT/ML IJ SOLN
INTRAMUSCULAR | Status: AC
  Filled 2018-02-10: qty 1

## 2018-02-10 MED ORDER — LIDOCAINE HCL 1 % IJ SOLN
INTRAMUSCULAR | Status: AC
  Filled 2018-02-10: qty 20

## 2018-02-10 MED ORDER — FENTANYL CITRATE (PF) 100 MCG/2ML IJ SOLN
INTRAMUSCULAR | Status: AC | PRN
  Administered 2018-02-10: 25 ug via INTRAVENOUS

## 2018-02-10 MED ORDER — SODIUM CHLORIDE 0.9 % IV SOLN: INTRAVENOUS | Status: DC

## 2018-02-10 MED ORDER — LIDOCAINE HCL (PF) 1 % IJ SOLN
INTRAMUSCULAR | Status: AC | PRN
  Administered 2018-02-10: 10 mL

## 2018-02-10 MED ORDER — MIDAZOLAM HCL 2 MG/2ML IJ SOLN
INTRAMUSCULAR | Status: AC | PRN
  Administered 2018-02-10: 1 mg via INTRAVENOUS

## 2018-02-10 NOTE — H&P (Signed)
  Chief Complaint  Aneurysm  History of Present Illness  Carly [name] is a 47 y.o. female who suffered a subarachnoid hemorrhage about one year ago. She underwent coiling of the ruptured left ICA aneurysm. Despite high-grade presentation she has made an excellent recovery and presents today for routine 1year f/u angiogram. She has no real complaints today.  Past Medical History   Past Medical History:  Diagnosis Date  . GERD (gastroesophageal reflux disease)   . Hypertension   . Migraines     Past Surgical History   Past Surgical History:  Procedure Laterality Date  . CESAREAN SECTION    . ESOPHAGOGASTRODUODENOSCOPY N/A 04/01/2017   Procedure: ESOPHAGOGASTRODUODENOSCOPY (EGD);  Surgeon: Georganna Skeans, MD;  Location: [facility];  Service: General;  Laterality: N/A;  . IR ANGIO INTRA EXTRACRAN SEL INTERNAL CAROTID UNI R MOD SED  03/03/2017  . IR ANGIO VERTEBRAL SEL VERTEBRAL BILAT MOD SED  03/03/2017  . IR ANGIOGRAM FOLLOW UP STUDY  03/03/2017  . IR ANGIOGRAM FOLLOW UP STUDY  03/03/2017  . IR ANGIOGRAM FOLLOW UP STUDY  03/03/2017  . IR ANGIOGRAM FOLLOW UP STUDY  03/03/2017  . IR REPLC GASTRO/COLONIC TUBE PERCUT W/FLUORO  04/15/2017  . IR TRANSCATH/EMBOLIZ  03/03/2017  . PEG PLACEMENT N/A 04/01/2017   Procedure: PERCUTANEOUS ENDOSCOPIC GASTROSTOMY (PEG) PLACEMENT;  Surgeon: Georganna Skeans, MD;  Location: [facility];  Service: General;  Laterality: N/A;  . RADIOLOGY WITH ANESTHESIA N/A 03/03/2017   Procedure: RADIOLOGY WITH ANESTHESIA;  Surgeon: Consuella Lose, MD;  Location: [facility];  Service: Radiology;  Laterality: N/A;    Social History   Social History   Tobacco Use  . Smoking status: Never Smoker  . Smokeless tobacco: Never Used  Substance Use Topics  . Alcohol use: Not Currently    Comment: occ  . Drug use: No    Medications   Prior to Admission medications   Medication Sig Start Date End Date Taking? Authorizing Provider  levETIRAcetam (KEPPRA) 1000 MG tablet  Take 1 tablet (1,000 mg total) by mouth 2 (two) times daily. 04/23/17  Yes Love, Ivan Anchors, PA-C  metoprolol tartrate (LOPRESSOR) 50 MG tablet Take 50 mg by mouth 2 (two) times daily.   Yes [provider]  Multiple Vitamin (MULTIVITAMIN WITH MINERALS) TABS tablet Take 1 tablet by mouth daily. 04/24/17  Yes Love, Ivan Anchors, PA-C    Allergies  No Known Allergies  Review of Systems  ROS  Neurologic Exam  Awake, alert, oriented Memory and concentration grossly intact Speech fluent, appropriate CN grossly intact Motor exam: Upper Extremities Deltoid Bicep Tricep Grip  Right 5/5 5/5 5/5 5/5  Left 5/5 5/5 5/5 5/5   Lower Extremities IP Quad PF DF EHL  Right 5/5 5/5 5/5 5/5 5/5  Left 5/5 5/5 5/5 5/5 5/5   Sensation grossly intact to LT  Impression  - 47 y.o. female 1 year s/p coiling of ruptured left ICA aneurysm, doing well.  Plan  - Proceed with f/u diagnostic cerebral angiogram  I have reviewed in the office the indications, risks, benefits, and alternatives to the angiogram with the patient and her husband. All questions were answered today, consent was obtained.

## 2018-02-10 NOTE — Discharge Instructions (Signed)
Cerebral Angiogram, Care After °Refer to this sheet in the next few weeks. These instructions provide you with information on caring for yourself after your procedure. Your health care provider may also give you more specific instructions. Your treatment has been planned according to current medical practices, but problems sometimes occur. Call your health care provider if you have any problems or questions after your procedure. °What can I expect after the procedure? °After your procedure, it is typical to have the following: °· Bruising at the catheter insertion site that usually fades within 1-2 weeks. °· Blood collecting in the tissue (hematoma) that may be painful to the touch. It should usually decrease in size and tenderness within 1-2 weeks. °· A mild headache. ° °Follow these instructions at home: °· Take medicines only as directed by your health care provider. °· You may shower 24-48 hours after the procedure or as directed by your health care provider. Remove the bandage (dressing) and gently wash the site with plain soap and water. Pat the area dry with a clean towel. Do not rub the site, because this may cause bleeding. °· Do not take baths, swim, or use a hot tub until your health care provider approves. °· Check your insertion site every day for redness, swelling, or drainage. °· Do not apply powder or lotion to the site. °· Do not lift over 10 lb (4.5 kg) for 5 days after your procedure or as directed by your health care provider. °· Ask your health care provider when it is okay to: °? Return to work or school. °? Resume usual physical activities or sports. °? Resume sexual activity. °· Do not drive home if you are discharged the same day as the procedure. Have someone else drive you. °· You may drive 24 hours after the procedure unless otherwise instructed by your health care provider. °· Do not operate machinery or power tools for 24 hours after the procedure or as directed by your health care  provider. °· If your procedure was done as an outpatient procedure, which means that you went home the same day as your procedure, a responsible adult should be with you for the first 24 hours after you arrive home. °· Keep all follow-up visits as directed by your health care provider. This is important. °Contact a health care provider if: °· You have a fever. °· You have chills. °· You have increased bleeding from the catheter insertion site. Hold pressure on the site. °Get help right away if: °· You have vision changes or loss of vision. °· You have numbness or weakness on one side of your body. °· You have difficulty talking, or you have slurred speech or cannot speak (aphasia). °· You feel confused or have difficulty remembering. °· You have unusual pain at the catheter insertion site. °· You have redness, warmth, or swelling at the catheter insertion site. °· You have drainage (other than a small amount of blood on the dressing) from the catheter insertion site. °· The catheter insertion site is bleeding, and the bleeding does not stop after 30 minutes of holding steady pressure on the site. °These symptoms may represent a serious problem that is an emergency. Do not wait to see if the symptoms will go away. Get medical help right away. Call your local emergency services (911 in [place] Do not drive yourself to the hospital. °This information is not intended to replace advice given to you by your health care provider. Make sure you discuss any questions   you have with your health care provider. °Document Released: 01/17/2014 Document Revised: 02/08/2016 Document Reviewed: 09/15/2013 °Elsevier Interactive Patient Education © 2017 [employer] °Moderate Conscious Sedation, Adult, Care After °These instructions provide you with information about caring for yourself after your procedure. Your health care provider may also give you more specific instructions. Your treatment has been planned according to current  medical practices, but problems sometimes occur. Call your health care provider if you have any problems or questions after your procedure. °What can I expect after the procedure? °After your procedure, it is common: °· To feel sleepy for several hours. °· To feel clumsy and have poor balance for several hours. °· To have poor judgment for several hours. °· To vomit if you eat too soon. ° °Follow these instructions at home: °For at least 24 hours after the procedure: ° °· Do not: °? Participate in activities where you could fall or become injured. °? Drive. °? Use heavy machinery. °? Drink alcohol. °? Take sleeping pills or medicines that cause drowsiness. °? Make important decisions or sign legal documents. °? Take care of children on your own. °· Rest. °Eating and drinking °· Follow the diet recommended by your health care provider. °· If you vomit: °? Drink water, juice, or soup when you can drink without vomiting. °? Make sure you have little or no nausea before eating solid foods. °General instructions °· Have a responsible adult stay with you until you are awake and alert. °· Take over-the-counter and prescription medicines only as told by your health care provider. °· If you smoke, do not smoke without supervision. °· Keep all follow-up visits as told by your health care provider. This is important. °Contact a health care provider if: °· You keep feeling nauseous or you keep vomiting. °· You feel light-headed. °· You develop a rash. °· You have a fever. °Get help right away if: °· You have trouble breathing. °This information is not intended to replace advice given to you by your health care provider. Make sure you discuss any questions you have with your health care provider. °Document Released: 06/23/2013 Document Revised: 02/05/2016 Document Reviewed: 12/23/2015 °Elsevier Interactive Patient Education © 2018 Elsevier Inc. ° °

## 2018-04-02 ENCOUNTER — Other Ambulatory Visit: Payer: Self-pay | Admitting: Neurosurgery

## 2018-04-02 ENCOUNTER — Other Ambulatory Visit (HOSPITAL_COMMUNITY): Payer: Self-pay | Admitting: Neurosurgery

## 2018-04-02 DIAGNOSIS — I671 Cerebral aneurysm, nonruptured: Secondary | ICD-10-CM

## 2018-04-02 DIAGNOSIS — I609 Nontraumatic subarachnoid hemorrhage, unspecified: Secondary | ICD-10-CM

## 2018-04-06 ENCOUNTER — Other Ambulatory Visit: Payer: Self-pay | Admitting: Neurosurgery

## 2018-04-20 ENCOUNTER — Other Ambulatory Visit: Payer: Self-pay

## 2018-04-20 ENCOUNTER — Encounter (HOSPITAL_COMMUNITY): Payer: Self-pay

## 2018-04-20 ENCOUNTER — Encounter (HOSPITAL_COMMUNITY)
Admission: RE | Admit: 2018-04-20 | Discharge: 2018-04-20 | Disposition: A | Discharge status: Home / Self Care | Payer: 59 | Source: Ambulatory Visit | Attending: Neurosurgery | Admitting: Neurosurgery

## 2018-04-20 DIAGNOSIS — R413 Other amnesia: Secondary | ICD-10-CM | POA: Diagnosis not present

## 2018-04-20 DIAGNOSIS — K219 Gastro-esophageal reflux disease without esophagitis: Secondary | ICD-10-CM | POA: Insufficient documentation

## 2018-04-20 DIAGNOSIS — I10 Essential (primary) hypertension: Secondary | ICD-10-CM | POA: Insufficient documentation

## 2018-04-20 DIAGNOSIS — R001 Bradycardia, unspecified: Secondary | ICD-10-CM | POA: Insufficient documentation

## 2018-04-20 DIAGNOSIS — G43909 Migraine, unspecified, not intractable, without status migrainosus: Secondary | ICD-10-CM | POA: Insufficient documentation

## 2018-04-20 DIAGNOSIS — I609 Nontraumatic subarachnoid hemorrhage, unspecified: Secondary | ICD-10-CM | POA: Insufficient documentation

## 2018-04-20 DIAGNOSIS — Z01812 Encounter for preprocedural laboratory examination: Secondary | ICD-10-CM | POA: Insufficient documentation

## 2018-04-20 DIAGNOSIS — Z79899 Other long term (current) drug therapy: Secondary | ICD-10-CM | POA: Diagnosis not present

## 2018-04-20 DIAGNOSIS — Z7982 Long term (current) use of aspirin: Secondary | ICD-10-CM | POA: Insufficient documentation

## 2018-04-20 DIAGNOSIS — Z7902 Long term (current) use of antithrombotics/antiplatelets: Secondary | ICD-10-CM | POA: Diagnosis not present

## 2018-04-20 DIAGNOSIS — Z01818 Encounter for other preprocedural examination: Secondary | ICD-10-CM | POA: Insufficient documentation

## 2018-04-20 HISTORY — DX: Nontraumatic subarachnoid hemorrhage, unspecified: I60.9

## 2018-04-20 HISTORY — DX: Unspecified convulsions: R56.9

## 2018-04-20 HISTORY — DX: Other amnesia: R41.3

## 2018-04-20 LAB — BASIC METABOLIC PANEL
ANION GAP: 10 (ref 5–15)
BUN: 9 mg/dL (ref 6–20)
CHLORIDE: 107 mmol/L (ref 98–111)
CO2: 24 mmol/L (ref 22–32)
Calcium: 9.4 mg/dL (ref 8.9–10.3)
Creatinine, Ser: 1.22 mg/dL — ABNORMAL HIGH (ref 0.44–1.00)
GFR calc Af Amer: 60 mL/min — ABNORMAL LOW (ref >60)
GFR calc non Af Amer: 52 mL/min — ABNORMAL LOW (ref >60)
Glucose, Bld: 93 mg/dL (ref 70–99)
POTASSIUM: 3.8 mmol/L (ref 3.5–5.1)
Sodium: 141 mmol/L (ref 135–145)

## 2018-04-20 LAB — PROTIME-INR
INR: 0.99
Prothrombin Time: 13 seconds (ref 11.4–15.2)

## 2018-04-20 LAB — CBC WITH DIFFERENTIAL/PLATELET
ABS IMMATURE GRANULOCYTES: 0 10*3/uL (ref 0.0–0.1)
BASOS PCT: 0 %
Basophils Absolute: 0 10*3/uL (ref 0.0–0.1)
Eosinophils Absolute: 0 10*3/uL (ref 0.0–0.7)
Eosinophils Relative: 1 %
HEMATOCRIT: 44.6 % (ref 36.0–46.0)
HEMOGLOBIN: 14.1 g/dL (ref 12.0–15.0)
IMMATURE GRANULOCYTES: 0 %
LYMPHS ABS: 1.8 10*3/uL (ref 0.7–4.0)
LYMPHS PCT: 34 %
MCH: 27.3 pg (ref 26.0–34.0)
MCHC: 31.6 g/dL (ref 30.0–36.0)
MCV: 86.4 fL (ref 78.0–100.0)
MONOS PCT: 11 %
Monocytes Absolute: 0.6 10*3/uL (ref 0.1–1.0)
NEUTROS ABS: 2.8 10*3/uL (ref 1.7–7.7)
NEUTROS PCT: 54 %
PLATELETS: 174 10*3/uL (ref 150–400)
RBC: 5.16 MIL/uL — ABNORMAL HIGH (ref 3.87–5.11)
RDW: 13.5 % (ref 11.5–15.5)
WBC: 5.2 10*3/uL (ref 4.0–10.5)

## 2018-04-20 LAB — SURGICAL PCR SCREEN
MRSA, PCR: NEGATIVE
STAPHYLOCOCCUS AUREUS: POSITIVE — AB

## 2018-04-20 LAB — APTT: aPTT: 25 seconds (ref 24–36)

## 2018-04-20 LAB — URINALYSIS, ROUTINE W REFLEX MICROSCOPIC
Bilirubin Urine: NEGATIVE
GLUCOSE, UA: NEGATIVE mg/dL
Hgb urine dipstick: NEGATIVE
KETONES UR: NEGATIVE mg/dL
LEUKOCYTES UA: NEGATIVE
Nitrite: NEGATIVE
PH: 6 (ref 5.0–8.0)
Protein, ur: NEGATIVE mg/dL
SPECIFIC GRAVITY, URINE: 1.018 (ref 1.005–1.030)

## 2018-04-20 LAB — GLUCOSE, CAPILLARY: Glucose-Capillary: 87 mg/dL (ref 70–99)

## 2018-04-20 NOTE — Pre-Procedure Instructions (Signed)
Carly [name]  04/20/2018     No Pharmacies Listed   Your procedure is scheduled on Tuesday August 13.  Report to [facility] Admitting at 6:30 A.M.  Call this number if you have problems the morning of surgery:  [phone]   Remember:  Do not eat or drink after midnight.   Take these medicines the morning of surgery with A SIP OF WATER:   Levetiracetam (Keppra) Metoprolol (lopressor) Acetaminophen (tylenol)   FOLLOW your surgeon's instructions on Aspirin and Plavix  7 days prior to surgery STOP taking any Aleve, Naproxen, Ibuprofen, Motrin, Advil, Goody's, BC's, all herbal medications, fish oil, and all vitamins      Do not wear jewelry, make-up or nail polish.  Do not wear lotions, powders, or perfumes, or deodorant.  Do not shave 48 hours prior to surgery.  Men may shave face and neck.  Do not bring valuables to the hospital.  [facility] is not responsible for any belongings or valuables.  Contacts, dentures or bridgework may not be worn into surgery.  Leave your suitcase in the car.  After surgery it may be brought to your room.  For patients admitted to the hospital, discharge time will be determined by your treatment team.  Patients discharged the day of surgery will not be allowed to drive home.   Special instructions:    [facility]- Preparing For Surgery  Before surgery, you can play an important role. Because skin is not sterile, your skin needs to be as free of germs as possible. You can reduce the number of germs on your skin by washing with CHG (chlorahexidine gluconate) Soap before surgery.  CHG is an antiseptic cleaner which kills germs and bonds with the skin to continue killing germs even after washing.    Oral Hygiene is also important to reduce your risk of infection.  Remember - BRUSH YOUR TEETH THE MORNING OF SURGERY WITH YOUR REGULAR TOOTHPASTE  Please do not use if you have an allergy to CHG or antibacterial soaps. If your skin  becomes reddened/irritated stop using the CHG.  Do not shave (including legs and underarms) for at least 48 hours prior to first CHG shower. It is OK to shave your face.  Please follow these instructions carefully.   1. Shower the NIGHT BEFORE SURGERY and the MORNING OF SURGERY with CHG.   2. If you chose to wash your hair, wash your hair first as usual with your normal shampoo.  3. After you shampoo, rinse your hair and body thoroughly to remove the shampoo.  4. Use CHG as you would any other liquid soap. You can apply CHG directly to the skin and wash gently with a scrungie or a clean washcloth.   5. Apply the CHG Soap to your body ONLY FROM THE NECK DOWN.  Do not use on open wounds or open sores. Avoid contact with your eyes, ears, mouth and genitals (private parts). Wash Face and genitals (private parts)  with your normal soap.  6. Wash thoroughly, paying special attention to the area where your surgery will be performed.  7. Thoroughly rinse your body with warm water from the neck down.  8. DO NOT shower/wash with your normal soap after using and rinsing off the CHG Soap.  9. Pat yourself dry with a CLEAN TOWEL.  10. Wear CLEAN PAJAMAS to bed the night before surgery, wear comfortable clothes the morning of surgery  11. Place CLEAN SHEETS on your bed  the night of your first shower and DO NOT SLEEP WITH PETS.    Day of Surgery:  Do not apply any deodorants/lotions.  Please wear clean clothes to the hospital/surgery center.   Remember to brush your teeth WITH YOUR REGULAR TOOTHPASTE.    Please read over the following fact sheets that you were given. Coughing and Deep Breathing, MRSA Information and Surgical Site Infection Prevention

## 2018-04-20 NOTE — Progress Notes (Addendum)
Nasal PCR negative for MRSA, positive for MSSA. Patient notified and prescription called to pharmacy.   Pt called back stating pharmacy did not have prescription. I called and spoke with Pharmacy staff. Staff has not been able to check voicemail yet, where prescription was left, and will check this then page patient.

## 2018-04-20 NOTE — Progress Notes (Signed)
PCP - [facility] Cardiologist - pt does not have current cardiologist, had stress test done by Dr. Atilano Median- see care everywhere, pt denies cardiac hx  EKG - 04/20/2018  Stress Test - requested ECHO - 03/07/17  Pt will start ASA and Plavix tomorrow.   Anesthesia review: cardiology records requested  Patient denies shortness of breath, fever, cough and chest pain at PAT appointment   Patient verbalized understanding of instructions that were given to them at the PAT appointment. Patient was also instructed that they will need to review over the PAT instructions again at home before surgery.

## 2018-04-21 NOTE — Progress Notes (Addendum)
Anesthesia Chart Review:  Case:  [id number] Date/Time:  04/28/18 0815   Procedure:  Arteriogram, Embolization of recurrent aneurysm (N/A )   Anesthesia type:  General   Pre-op diagnosis:  subarachnoid hemorrhage I60.9   Location:  [facility] ROOM 18 / [facility]   Surgeon:  Consuella Lose, MD      DISCUSSION: Patient is a 47 year old female scheduled for the above procedure.   History includes never smoker, HTN, GERD, migraines, SAH. - On 03/03/17, she presented to ED with several day history of headache with seizure activity that day at work. On arrival GCS of 15, but decompensated becoming unresponsive with focal twitching. She was emergently intubated and given IV Keppra. CT scan showed ruptured intracranial aneurysm with hemorrhage requiring emergent twist drill of cranium 03/03/17 in the ED followed by diagnostic angiogram and coiling of left ICA 03/03/17. ). Hospitalization was complicated by VDRF, pneumonia, and sepsis. She was extubated 03/25/17 and PEG placed 04/01/17 for nutritional support. She was discharged to [facility] for rehab from 04/09/17-04/23/17.  May 2019 notes indicate she had an "excellent recovery", but reported short term memory difficulties.   She will need a urine pregnancy test on the day of surgery. Nikki at Dr. Cleotilde Neer office will clarify with him if he would like a p2y12 done on the day of her procedure and order will be entered if needed. She is on ASA and Plavix. (Update 04/22/18 10:06 AM: Per Lexine Baton, Dr. Kathyrn Sheriff does not need a p2y12 prior to surgery.)   VS: BP (!) 141/64   Pulse (!) 58   Temp 36.8 C   Resp 20   Ht 5\' 4"  (1.626 m)   Wt 249 lb 4.8 oz (113.1 kg)   LMP 04/20/2018   SpO2 100%   BMI 42.79 kg/m   PROVIDERS: [name], MD is PCP - She is not followed routinely by cardiology, but was evaluated by Alfonso Patten, MD on 11/02/16 (Care Everywhere) for atypical chest pain. She had a non-ischemic stress echo.   LABS: Labs reviewed: Acceptable for  surgery. (all labs ordered are listed, but only abnormal results are displayed)  Labs Reviewed  SURGICAL PCR SCREEN - Abnormal; Notable for the following components:      Result Value   Staphylococcus aureus POSITIVE (*)    All other components within normal limits  BASIC METABOLIC PANEL - Abnormal; Notable for the following components:   Creatinine, Ser 1.22 (*)    GFR calc non Af Amer 52 (*)    GFR calc Af Amer 60 (*)    All other components within normal limits  CBC WITH DIFFERENTIAL/PLATELET - Abnormal; Notable for the following components:   RBC 5.16 (*)    All other components within normal limits  URINALYSIS, ROUTINE W REFLEX MICROSCOPIC - Abnormal; Notable for the following components:   APPearance HAZY (*)    All other components within normal limits  GLUCOSE, CAPILLARY  APTT  PROTIME-INR     IMAGES: Cerebral angiogram 02/10/18: IMPRESSION: 1. 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 Immediate post embolization angiogram last year demonstrated residual measuring 1.4 x 2.0 mm]. 2. No other intracranial aneurysms, AVMs, or high-flow fistulas are seen.   EKG: 04/20/18: SB at 54 bpm. LAD. QT 436 ms, QTc 413 ms.   CV: Echo 03/07/17: Study Conclusions - Left ventricle:  The cavity size was normal. There was mild focal   basal hypertrophy of the septum. Systolic function was normal.   There was dynamic obstruction at rest, with a peak velocity of   214 cm/sec and a peak gradient of 18 mm Hg. Wall motion was   normal; there were no regional wall motion abnormalities. Left   ventricular diastolic function parameters were normal. - Aortic valve: Transvalvular velocity was within the normal range.   There was no stenosis. There was no regurgitation. Valve area   (Vmax): 2.4 cm^2. - Mitral valve:  There was mild systolic anterior motion.   Transvalvular velocity was within the normal range. There was no   evidence for stenosis. There was no regurgitation. - Left atrium: The atrium was mildly dilated. - Right ventricle: The cavity size was normal. Wall thickness was   normal. Systolic function was normal. - Tricuspid valve: There was mild regurgitation. - Pulmonary arteries: Systolic pressure was within the normal   range. PA peak pressure: 22 mm Hg (S).  Stress echo 11/26/16 (Dr. Atilano Median): Conclusions: Normal stress echo and EKG, no evidence of ischemia noted.   Past Medical History:  Diagnosis Date  . GERD (gastroesophageal reflux disease)   . Hypertension   . Memory difficulties    short term memory  . Migraines   . 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 Subarachnoid hemorrhage ([facility])    d/t burst aneurysm, had seizures    Past Surgical History:  Procedure Laterality Date  . CESAREAN SECTION    . ESOPHAGOGASTRODUODENOSCOPY N/A 04/01/2017   Procedure: ESOPHAGOGASTRODUODENOSCOPY (EGD);  Surgeon: Georganna Skeans, MD;  Location: [place];  Service: General;  Laterality: N/A;  . IR ANGIO INTRA EXTRACRAN SEL INTERNAL CAROTID UNI R MOD SED  03/03/2017  . IR ANGIO VERTEBRAL SEL SUBCLAVIAN INNOMINATE BILAT MOD SED  02/10/2018  . IR ANGIO VERTEBRAL SEL VERTEBRAL BILAT MOD SED  03/03/2017  . IR ANGIO VERTEBRAL SEL VERTEBRAL BILAT MOD SED  02/10/2018  . IR ANGIOGRAM FOLLOW UP STUDY  03/03/2017  . IR ANGIOGRAM FOLLOW UP STUDY  03/03/2017  . IR ANGIOGRAM FOLLOW UP STUDY  03/03/2017  . IR ANGIOGRAM FOLLOW UP STUDY  03/03/2017  . IR REPLC GASTRO/COLONIC TUBE PERCUT W/FLUORO  04/15/2017  . IR TRANSCATH/EMBOLIZ  03/03/2017  . LAPAROSCOPY     in early 90s  . PEG PLACEMENT N/A 04/01/2017   Procedure: PERCUTANEOUS ENDOSCOPIC GASTROSTOMY (PEG) PLACEMENT;  Surgeon: Georganna Skeans, MD;  Location: [facility];  Service: General;  Laterality: N/A;  . RADIOLOGY WITH ANESTHESIA N/A 03/03/2017    Procedure: RADIOLOGY WITH ANESTHESIA;  Surgeon: Consuella Lose, MD;  Location: [facility];  Service: Radiology;  Laterality: N/A;    MEDICATIONS: . acetaminophen (TYLENOL) 500 MG tablet  . aspirin 325 MG EC tablet  . clopidogrel (PLAVIX) 75 MG tablet  . levETIRAcetam (KEPPRA) 1000 MG tablet  . metoprolol tartrate (LOPRESSOR) 50 MG tablet  . Multiple Vitamin (MULTIVITAMIN WITH MINERALS) TABS tablet   No current facility-administered medications for this encounter.     [facility] Short Stay Center/Anesthesiology Phone [phone] 04/21/2018 12:45 PM

## 2018-04-28 ENCOUNTER — Ambulatory Visit (HOSPITAL_COMMUNITY)
Admission: RE | Admit: 2018-04-28 | Discharge: 2018-04-28 | Disposition: A | Discharge status: Home / Self Care | Payer: 59 | Source: Ambulatory Visit | Attending: Neurosurgery | Admitting: Neurosurgery

## 2018-05-14 ENCOUNTER — Other Ambulatory Visit: Payer: Self-pay

## 2018-05-14 ENCOUNTER — Encounter (HOSPITAL_COMMUNITY): Payer: Self-pay | Admitting: *Deleted

## 2018-05-14 NOTE — Progress Notes (Signed)
Spoke with pt for pre-op call. Pt had a PAT appt for this surgery on 04/20/18 but surgery was rescheduled for tomorrow. She states nothing has changed with her allergies, medications, medical and surgical history. She started her Aspirin and Plavix on 05/08/18 as instructed by Dr. Kathyrn Sheriff. Pt denies any recent chest pain, fever, flu like symptoms since she was seen here.

## 2018-05-15 ENCOUNTER — Ambulatory Visit (HOSPITAL_COMMUNITY)
Admission: RE | Admit: 2018-05-15 | Discharge: 2018-05-15 | Disposition: A | Discharge status: Home / Self Care | Payer: 59 | Source: Ambulatory Visit | Attending: Neurosurgery | Admitting: Neurosurgery

## 2018-05-15 ENCOUNTER — Encounter (HOSPITAL_COMMUNITY)
Admission: AD | Disposition: A | Discharge status: Rehabilitation Facility | Payer: Self-pay | Source: Ambulatory Visit | Attending: Neurosurgery

## 2018-05-15 ENCOUNTER — Inpatient Hospital Stay (HOSPITAL_COMMUNITY)
Admission: AD | Admit: 2018-05-15 | Discharge: 2018-05-28 | DRG: 025 | Disposition: A | Discharge status: Rehabilitation Facility | Payer: 59 | Attending: Neurosurgery | Admitting: Neurosurgery

## 2018-05-15 ENCOUNTER — Encounter (HOSPITAL_COMMUNITY): Payer: Self-pay | Admitting: *Deleted

## 2018-05-15 ENCOUNTER — Other Ambulatory Visit: Payer: Self-pay

## 2018-05-15 ENCOUNTER — Ambulatory Visit (HOSPITAL_COMMUNITY): Payer: 59 | Admitting: Anesthesiology

## 2018-05-15 ENCOUNTER — Inpatient Hospital Stay (HOSPITAL_COMMUNITY): Payer: 59

## 2018-05-15 ENCOUNTER — Ambulatory Visit (HOSPITAL_COMMUNITY): Payer: 59 | Admitting: Vascular Surgery

## 2018-05-15 DIAGNOSIS — R680 Hypothermia, not associated with low environmental temperature: Secondary | ICD-10-CM | POA: Diagnosis not present

## 2018-05-15 DIAGNOSIS — R059 Cough, unspecified: Secondary | ICD-10-CM

## 2018-05-15 DIAGNOSIS — R413 Other amnesia: Secondary | ICD-10-CM | POA: Diagnosis present

## 2018-05-15 DIAGNOSIS — Z4659 Encounter for fitting and adjustment of other gastrointestinal appliance and device: Secondary | ICD-10-CM

## 2018-05-15 DIAGNOSIS — Z8249 Family history of ischemic heart disease and other diseases of the circulatory system: Secondary | ICD-10-CM

## 2018-05-15 DIAGNOSIS — E876 Hypokalemia: Secondary | ICD-10-CM | POA: Diagnosis not present

## 2018-05-15 DIAGNOSIS — R29709 NIHSS score 9: Secondary | ICD-10-CM | POA: Diagnosis not present

## 2018-05-15 DIAGNOSIS — R4701 Aphasia: Secondary | ICD-10-CM | POA: Diagnosis not present

## 2018-05-15 DIAGNOSIS — R1310 Dysphagia, unspecified: Secondary | ICD-10-CM | POA: Diagnosis not present

## 2018-05-15 DIAGNOSIS — G8191 Hemiplegia, unspecified affecting right dominant side: Secondary | ICD-10-CM | POA: Diagnosis not present

## 2018-05-15 DIAGNOSIS — R0902 Hypoxemia: Secondary | ICD-10-CM | POA: Diagnosis not present

## 2018-05-15 DIAGNOSIS — J15211 Pneumonia due to Methicillin susceptible Staphylococcus aureus: Secondary | ICD-10-CM | POA: Diagnosis not present

## 2018-05-15 DIAGNOSIS — Z6833 Body mass index (BMI) 33.0-33.9, adult: Secondary | ICD-10-CM | POA: Diagnosis not present

## 2018-05-15 DIAGNOSIS — R001 Bradycardia, unspecified: Secondary | ICD-10-CM | POA: Diagnosis not present

## 2018-05-15 DIAGNOSIS — K219 Gastro-esophageal reflux disease without esophagitis: Secondary | ICD-10-CM | POA: Diagnosis present

## 2018-05-15 DIAGNOSIS — G40901 Epilepsy, unspecified, not intractable, with status epilepticus: Secondary | ICD-10-CM | POA: Diagnosis not present

## 2018-05-15 DIAGNOSIS — I671 Cerebral aneurysm, nonruptured: Principal | ICD-10-CM | POA: Diagnosis present

## 2018-05-15 DIAGNOSIS — G92 Toxic encephalopathy: Secondary | ICD-10-CM | POA: Diagnosis not present

## 2018-05-15 DIAGNOSIS — E669 Obesity, unspecified: Secondary | ICD-10-CM | POA: Diagnosis present

## 2018-05-15 DIAGNOSIS — Z8679 Personal history of other diseases of the circulatory system: Secondary | ICD-10-CM

## 2018-05-15 DIAGNOSIS — Z8349 Family history of other endocrine, nutritional and metabolic diseases: Secondary | ICD-10-CM

## 2018-05-15 DIAGNOSIS — G40101 Localization-related (focal) (partial) symptomatic epilepsy and epileptic syndromes with simple partial seizures, not intractable, with status epilepticus: Secondary | ICD-10-CM | POA: Diagnosis present

## 2018-05-15 DIAGNOSIS — Z978 Presence of other specified devices: Secondary | ICD-10-CM

## 2018-05-15 DIAGNOSIS — R339 Retention of urine, unspecified: Secondary | ICD-10-CM | POA: Diagnosis not present

## 2018-05-15 DIAGNOSIS — Y92230 Patient room in hospital as the place of occurrence of the external cause: Secondary | ICD-10-CM | POA: Diagnosis not present

## 2018-05-15 DIAGNOSIS — I609 Nontraumatic subarachnoid hemorrhage, unspecified: Secondary | ICD-10-CM

## 2018-05-15 DIAGNOSIS — E6609 Other obesity due to excess calories: Secondary | ICD-10-CM | POA: Diagnosis not present

## 2018-05-15 DIAGNOSIS — T426X5A Adverse effect of other antiepileptic and sedative-hypnotic drugs, initial encounter: Secondary | ICD-10-CM | POA: Diagnosis not present

## 2018-05-15 DIAGNOSIS — R2689 Other abnormalities of gait and mobility: Secondary | ICD-10-CM | POA: Diagnosis not present

## 2018-05-15 DIAGNOSIS — I952 Hypotension due to drugs: Secondary | ICD-10-CM | POA: Diagnosis not present

## 2018-05-15 DIAGNOSIS — Z7982 Long term (current) use of aspirin: Secondary | ICD-10-CM

## 2018-05-15 DIAGNOSIS — J9601 Acute respiratory failure with hypoxia: Secondary | ICD-10-CM | POA: Diagnosis not present

## 2018-05-15 DIAGNOSIS — Z800 Family history of malignant neoplasm of digestive organs: Secondary | ICD-10-CM

## 2018-05-15 DIAGNOSIS — R05 Cough: Secondary | ICD-10-CM

## 2018-05-15 DIAGNOSIS — R030 Elevated blood-pressure reading, without diagnosis of hypertension: Secondary | ICD-10-CM | POA: Diagnosis not present

## 2018-05-15 DIAGNOSIS — I69019 Unspecified symptoms and signs involving cognitive functions following nontraumatic subarachnoid hemorrhage: Secondary | ICD-10-CM | POA: Diagnosis not present

## 2018-05-15 DIAGNOSIS — H5509 Other forms of nystagmus: Secondary | ICD-10-CM | POA: Diagnosis not present

## 2018-05-15 DIAGNOSIS — R5381 Other malaise: Secondary | ICD-10-CM | POA: Diagnosis not present

## 2018-05-15 DIAGNOSIS — R569 Unspecified convulsions: Secondary | ICD-10-CM | POA: Diagnosis not present

## 2018-05-15 DIAGNOSIS — E46 Unspecified protein-calorie malnutrition: Secondary | ICD-10-CM | POA: Diagnosis not present

## 2018-05-15 DIAGNOSIS — Z7902 Long term (current) use of antithrombotics/antiplatelets: Secondary | ICD-10-CM

## 2018-05-15 DIAGNOSIS — N990 Postprocedural (acute) (chronic) kidney failure: Secondary | ICD-10-CM | POA: Diagnosis not present

## 2018-05-15 DIAGNOSIS — R0689 Other abnormalities of breathing: Secondary | ICD-10-CM | POA: Diagnosis not present

## 2018-05-15 DIAGNOSIS — R269 Unspecified abnormalities of gait and mobility: Secondary | ICD-10-CM | POA: Diagnosis not present

## 2018-05-15 DIAGNOSIS — J9690 Respiratory failure, unspecified, unspecified whether with hypoxia or hypercapnia: Secondary | ICD-10-CM

## 2018-05-15 DIAGNOSIS — D62 Acute posthemorrhagic anemia: Secondary | ICD-10-CM | POA: Diagnosis not present

## 2018-05-15 DIAGNOSIS — G9340 Encephalopathy, unspecified: Secondary | ICD-10-CM

## 2018-05-15 DIAGNOSIS — Z79899 Other long term (current) drug therapy: Secondary | ICD-10-CM

## 2018-05-15 DIAGNOSIS — J690 Pneumonitis due to inhalation of food and vomit: Secondary | ICD-10-CM | POA: Diagnosis not present

## 2018-05-15 DIAGNOSIS — J9600 Acute respiratory failure, unspecified whether with hypoxia or hypercapnia: Secondary | ICD-10-CM | POA: Diagnosis not present

## 2018-05-15 DIAGNOSIS — Z833 Family history of diabetes mellitus: Secondary | ICD-10-CM

## 2018-05-15 HISTORY — PX: IR TRANSCATH/EMBOLIZ: IMG695

## 2018-05-15 HISTORY — PX: IR ANGIOGRAM FOLLOW UP STUDY: IMG697

## 2018-05-15 HISTORY — PX: IR ANGIO INTRA EXTRACRAN SEL INTERNAL CAROTID UNI L MOD SED: IMG5361

## 2018-05-15 HISTORY — PX: RADIOLOGY WITH ANESTHESIA: SHX6223

## 2018-05-15 HISTORY — PX: IR US GUIDE VASC ACCESS RIGHT: IMG2390

## 2018-05-15 LAB — CBC
HEMATOCRIT: 40.2 % (ref 36.0–46.0)
HEMOGLOBIN: 13.2 g/dL (ref 12.0–15.0)
MCH: 27.7 pg (ref 26.0–34.0)
MCHC: 32.8 g/dL (ref 30.0–36.0)
MCV: 84.3 fL (ref 78.0–100.0)
Platelets: 177 10*3/uL (ref 150–400)
RBC: 4.77 MIL/uL (ref 3.87–5.11)
RDW: 13.3 % (ref 11.5–15.5)
WBC: 7.1 10*3/uL (ref 4.0–10.5)

## 2018-05-15 LAB — BASIC METABOLIC PANEL
Anion gap: 16 — ABNORMAL HIGH (ref 5–15)
BUN: 15 mg/dL (ref 6–20)
CALCIUM: 9 mg/dL (ref 8.9–10.3)
CHLORIDE: 108 mmol/L (ref 98–111)
CO2: 16 mmol/L — ABNORMAL LOW (ref 22–32)
CREATININE: 1.16 mg/dL — AB (ref 0.44–1.00)
GFR calc non Af Amer: 54 mL/min — ABNORMAL LOW (ref >60)
GLUCOSE: 67 mg/dL — AB (ref 70–99)
Potassium: 3.9 mmol/L (ref 3.5–5.1)
Sodium: 140 mmol/L (ref 135–145)

## 2018-05-15 LAB — CBC WITH DIFFERENTIAL/PLATELET
Abs Immature Granulocytes: 0 10*3/uL (ref 0.0–0.1)
Basophils Absolute: 0 10*3/uL (ref 0.0–0.1)
Basophils Relative: 0 %
EOS ABS: 0.1 10*3/uL (ref 0.0–0.7)
EOS PCT: 1 %
HCT: 41.6 % (ref 36.0–46.0)
HEMOGLOBIN: 13.2 g/dL (ref 12.0–15.0)
Immature Granulocytes: 0 %
LYMPHS ABS: 1.7 10*3/uL (ref 0.7–4.0)
LYMPHS PCT: 28 %
MCH: 27.6 pg (ref 26.0–34.0)
MCHC: 31.7 g/dL (ref 30.0–36.0)
MCV: 87 fL (ref 78.0–100.0)
MONO ABS: 0.6 10*3/uL (ref 0.1–1.0)
MONOS PCT: 9 %
Neutro Abs: 3.9 10*3/uL (ref 1.7–7.7)
Neutrophils Relative %: 62 %
Platelets: 178 10*3/uL (ref 150–400)
RBC: 4.78 MIL/uL (ref 3.87–5.11)
RDW: 13.6 % (ref 11.5–15.5)
WBC: 6.2 10*3/uL (ref 4.0–10.5)

## 2018-05-15 LAB — TROPONIN I: Troponin I: <0.03 ng/mL (ref <0.03)

## 2018-05-15 LAB — APTT: APTT: 25 s (ref 24–36)

## 2018-05-15 LAB — POCT PREGNANCY, URINE: PREG TEST UR: NEGATIVE

## 2018-05-15 LAB — PROTIME-INR
INR: 1.01
Prothrombin Time: 13.2 seconds (ref 11.4–15.2)

## 2018-05-15 SURGERY — IR WITH ANESTHESIA
Anesthesia: General

## 2018-05-15 MED ORDER — ONDANSETRON HCL 4 MG/2ML IJ SOLN
4.0000 mg | INTRAMUSCULAR | Status: DC | PRN
  Administered 2018-05-16 – 2018-05-23 (×3): 4 mg via INTRAVENOUS
  Filled 2018-05-15 (×3): qty 2

## 2018-05-15 MED ORDER — ONDANSETRON HCL 4 MG PO TABS: 4.0000 mg | ORAL_TABLET | ORAL | Status: DC | PRN

## 2018-05-15 MED ORDER — METOPROLOL TARTRATE 50 MG PO TABS
50.0000 mg | ORAL_TABLET | Freq: Two times a day (BID) | ORAL | Status: DC
  Administered 2018-05-15 – 2018-05-16 (×2): 50 mg via ORAL
  Filled 2018-05-15 (×2): qty 1

## 2018-05-15 MED ORDER — LEVETIRACETAM 500 MG PO TABS
1000.0000 mg | ORAL_TABLET | Freq: Two times a day (BID) | ORAL | Status: DC
  Administered 2018-05-15 – 2018-05-16 (×3): 1000 mg via ORAL
  Filled 2018-05-15 (×4): qty 2

## 2018-05-15 MED ORDER — EPTIFIBATIDE BOLUS VIA INFUSION
180.0000 ug/kg | Freq: Once | INTRAVENOUS | Status: AC
  Administered 2018-05-15: 20300 ug via INTRAVENOUS
  Filled 2018-05-15: qty 28

## 2018-05-15 MED ORDER — HEPARIN SODIUM (PORCINE) 1000 UNIT/ML IJ SOLN
INTRAMUSCULAR | Status: DC | PRN
  Administered 2018-05-15: 1500 [IU] via INTRAVENOUS
  Administered 2018-05-15: 5000 [IU] via INTRAVENOUS

## 2018-05-15 MED ORDER — CHLORHEXIDINE GLUCONATE CLOTH 2 % EX PADS: 6.0000 | MEDICATED_PAD | Freq: Once | CUTANEOUS | Status: DC

## 2018-05-15 MED ORDER — ROCURONIUM BROMIDE 10 MG/ML (PF) SYRINGE
PREFILLED_SYRINGE | INTRAVENOUS | Status: DC | PRN
  Administered 2018-05-15: 10 mg via INTRAVENOUS
  Administered 2018-05-15: 40 mg via INTRAVENOUS

## 2018-05-15 MED ORDER — LABETALOL HCL 5 MG/ML IV SOLN
10.0000 mg | INTRAVENOUS | Status: DC | PRN
  Administered 2018-05-15: 10 mg via INTRAVENOUS
  Administered 2018-05-22: 20 mg via INTRAVENOUS
  Filled 2018-05-15 (×2): qty 4

## 2018-05-15 MED ORDER — CLOPIDOGREL BISULFATE 75 MG PO TABS
75.0000 mg | ORAL_TABLET | Freq: Every day | ORAL | Status: DC
  Administered 2018-05-16 – 2018-05-23 (×8): 75 mg via ORAL
  Filled 2018-05-15 (×10): qty 1

## 2018-05-15 MED ORDER — LACTATED RINGERS IV SOLN
INTRAVENOUS | Status: DC
  Administered 2018-05-15 (×2): via INTRAVENOUS

## 2018-05-15 MED ORDER — DEXAMETHASONE SODIUM PHOSPHATE 10 MG/ML IJ SOLN
INTRAMUSCULAR | Status: DC | PRN
  Administered 2018-05-15: 10 mg via INTRAVENOUS

## 2018-05-15 MED ORDER — NITROGLYCERIN 0.2 MG/ML ON CALL CATH LAB
INTRAVENOUS | Status: DC | PRN
  Administered 2018-05-15 (×3): 40 ug via INTRAVENOUS

## 2018-05-15 MED ORDER — ACETAMINOPHEN 500 MG PO TABS
1000.0000 mg | ORAL_TABLET | Freq: Four times a day (QID) | ORAL | Status: DC | PRN
  Administered 2018-05-17: 1000 mg via ORAL
  Filled 2018-05-15: qty 2

## 2018-05-15 MED ORDER — OXYCODONE HCL 5 MG/5ML PO SOLN: 5.0000 mg | Freq: Once | ORAL | Status: DC | PRN

## 2018-05-15 MED ORDER — HYDROMORPHONE HCL 1 MG/ML IJ SOLN: 0.2500 mg | INTRAMUSCULAR | Status: DC | PRN

## 2018-05-15 MED ORDER — HYDROCODONE-ACETAMINOPHEN 5-325 MG PO TABS
1.0000 | ORAL_TABLET | ORAL | Status: DC | PRN
  Administered 2018-05-15 – 2018-05-16 (×4): 1 via ORAL
  Filled 2018-05-15 (×4): qty 1

## 2018-05-15 MED ORDER — PROPOFOL 10 MG/ML IV BOLUS
INTRAVENOUS | Status: DC | PRN
  Administered 2018-05-15: 150 mg via INTRAVENOUS

## 2018-05-15 MED ORDER — PHENYLEPHRINE 40 MCG/ML (10ML) SYRINGE FOR IV PUSH (FOR BLOOD PRESSURE SUPPORT)
PREFILLED_SYRINGE | INTRAVENOUS | Status: DC | PRN
  Administered 2018-05-15: 80 ug via INTRAVENOUS

## 2018-05-15 MED ORDER — ADULT MULTIVITAMIN W/MINERALS CH
1.0000 | ORAL_TABLET | Freq: Every day | ORAL | Status: DC
  Administered 2018-05-16 – 2018-05-23 (×8): 1 via ORAL
  Filled 2018-05-15 (×10): qty 1

## 2018-05-15 MED ORDER — IOHEXOL 300 MG/ML  SOLN: 45.0000 mL | Freq: Once | INTRAMUSCULAR | Status: DC | PRN

## 2018-05-15 MED ORDER — CEFAZOLIN SODIUM-DEXTROSE 2-4 GM/100ML-% IV SOLN
INTRAVENOUS | Status: AC
  Filled 2018-05-15: qty 100

## 2018-05-15 MED ORDER — ASPIRIN EC 325 MG PO TBEC
325.0000 mg | DELAYED_RELEASE_TABLET | Freq: Every day | ORAL | Status: DC
  Administered 2018-05-16 – 2018-05-22 (×7): 325 mg via ORAL
  Filled 2018-05-15 (×9): qty 1

## 2018-05-15 MED ORDER — ONDANSETRON HCL 4 MG/2ML IJ SOLN
INTRAMUSCULAR | Status: DC | PRN
  Administered 2018-05-15: 4 mg via INTRAVENOUS

## 2018-05-15 MED ORDER — SUGAMMADEX SODIUM 200 MG/2ML IV SOLN
INTRAVENOUS | Status: DC | PRN
  Administered 2018-05-15: 226 mg via INTRAVENOUS

## 2018-05-15 MED ORDER — LIDOCAINE 2% (20 MG/ML) 5 ML SYRINGE
INTRAMUSCULAR | Status: DC | PRN
  Administered 2018-05-15: 80 mg via INTRAVENOUS

## 2018-05-15 MED ORDER — SODIUM CHLORIDE 0.9 % IV SOLN: INTRAVENOUS | Status: DC

## 2018-05-15 MED ORDER — EPHEDRINE SULFATE-NACL 50-0.9 MG/10ML-% IV SOSY
PREFILLED_SYRINGE | INTRAVENOUS | Status: DC | PRN
  Administered 2018-05-15: 10 mg via INTRAVENOUS
  Administered 2018-05-15 (×3): 5 mg via INTRAVENOUS
  Administered 2018-05-15: 10 mg via INTRAVENOUS

## 2018-05-15 MED ORDER — FENTANYL CITRATE (PF) 250 MCG/5ML IJ SOLN
INTRAMUSCULAR | Status: DC | PRN
  Administered 2018-05-15: 100 ug via INTRAVENOUS

## 2018-05-15 MED ORDER — MEPERIDINE HCL 50 MG/ML IJ SOLN: 6.2500 mg | INTRAMUSCULAR | Status: DC | PRN

## 2018-05-15 MED ORDER — PROMETHAZINE HCL 25 MG/ML IJ SOLN: 6.2500 mg | INTRAMUSCULAR | Status: DC | PRN

## 2018-05-15 MED ORDER — PANTOPRAZOLE SODIUM 20 MG PO TBEC
20.0000 mg | DELAYED_RELEASE_TABLET | Freq: Every day | ORAL | Status: DC
  Administered 2018-05-16 – 2018-05-17 (×2): 20 mg via ORAL
  Filled 2018-05-15 (×3): qty 1

## 2018-05-15 MED ORDER — EPTIFIBATIDE 75 MG/100ML IV SOLN
1.0000 ug/kg/min | INTRAVENOUS | Status: AC
  Administered 2018-05-15 (×2): 1 ug/kg/min via INTRAVENOUS
  Filled 2018-05-15 (×3): qty 100

## 2018-05-15 MED ORDER — SODIUM CHLORIDE 0.9 % IV SOLN
INTRAVENOUS | Status: DC
  Administered 2018-05-15 – 2018-05-23 (×13): via INTRAVENOUS

## 2018-05-15 MED ORDER — SODIUM CHLORIDE 0.9 % IV SOLN
INTRAVENOUS | Status: DC | PRN
  Administered 2018-05-15: 50 ug/min via INTRAVENOUS

## 2018-05-15 MED ORDER — OXYCODONE HCL 5 MG PO TABS: 5.0000 mg | ORAL_TABLET | Freq: Once | ORAL | Status: DC | PRN

## 2018-05-15 MED ORDER — MORPHINE SULFATE (PF) 2 MG/ML IV SOLN
1.0000 mg | INTRAVENOUS | Status: DC | PRN
  Administered 2018-05-15: 1 mg via INTRAVENOUS
  Administered 2018-05-15 – 2018-05-22 (×4): 2 mg via INTRAVENOUS
  Administered 2018-05-22: 1 mg via INTRAVENOUS
  Administered 2018-05-22 – 2018-05-23 (×2): 2 mg via INTRAVENOUS
  Administered 2018-05-23: 1 mg via INTRAVENOUS
  Administered 2018-05-24: 2 mg via INTRAVENOUS
  Filled 2018-05-15 (×10): qty 1

## 2018-05-15 MED ORDER — LACTATED RINGERS IV SOLN
INTRAVENOUS | Status: DC | PRN
  Administered 2018-05-15: 12:00:00 via INTRAVENOUS

## 2018-05-15 MED ORDER — CEFAZOLIN SODIUM-DEXTROSE 2-4 GM/100ML-% IV SOLN
2.0000 g | INTRAVENOUS | Status: AC
  Administered 2018-05-15: 2 g via INTRAVENOUS

## 2018-05-15 NOTE — Anesthesia Preprocedure Evaluation (Addendum)
Anesthesia Evaluation  Patient identified by MRN, date of birth, ID band Patient awake    Reviewed: Allergy & Precautions, NPO status , Patient's Chart, lab work & pertinent test results  Airway Mallampati: I  TM Distance: >3 FB Neck ROM: Full    Dental no notable dental hx. (+) Teeth Intact, Dental Advisory Given,    Pulmonary    Pulmonary exam normal breath sounds clear to auscultation       Cardiovascular hypertension, Pt. on medications Normal cardiovascular exam Rhythm:Regular Rate:Normal     Neuro/Psych  Headaches, Seizures -,  SAN with aneuysm. Non verbal but can communicate CVA    GI/Hepatic GERD  Medicated and Controlled,  Endo/Other    Renal/GU      Musculoskeletal   Abdominal   Peds  Hematology   Anesthesia Other Findings   Reproductive/Obstetrics                            Anesthesia Physical  Anesthesia Plan  ASA: III  Anesthesia Plan: General   Post-op Pain Management:    Induction: Intravenous  PONV Risk Score and Plan: 3 and Ondansetron, Dexamethasone and Midazolam  Airway Management Planned: Oral ETT  Additional Equipment: Arterial line  Intra-op Plan:   Post-operative Plan: Extubation in OR  Informed Consent: I have reviewed the patients History and Physical, chart, labs and discussed the procedure including the risks, benefits and alternatives for the proposed anesthesia with the patient or authorized representative who has indicated his/her understanding and acceptance.   Dental advisory given  Plan Discussed with: CRNA and Surgeon  Anesthesia Plan Comments:         Anesthesia Quick Evaluation

## 2018-05-15 NOTE — Sedation Documentation (Signed)
Right groin site checked with PACU nurse.

## 2018-05-15 NOTE — Progress Notes (Signed)
Patient c/o headache with a pain level of 7/10. Current pain medication Vicodin 5-325 not relieving it. Neurosurgery contacted. RN will continue to monitor

## 2018-05-15 NOTE — Anesthesia Procedure Notes (Signed)
Procedure Name: Intubation Date/Time: 05/15/2018 10:26 AM Performed by: Renato Shin, CRNA Pre-anesthesia Checklist: Patient identified, Emergency Drugs available, Suction available and Patient being monitored Patient Re-evaluated:Patient Re-evaluated prior to induction Oxygen Delivery Method: Circle system utilized Preoxygenation: Pre-oxygenation with 100% oxygen Induction Type: IV induction Ventilation: Mask ventilation without difficulty Laryngoscope Size: Miller and 2 Grade View: Grade I Tube type: Oral Tube size: 7.0 mm Number of attempts: 1 Airway Equipment and Method: Stylet Placement Confirmation: ETT inserted through vocal cords under direct vision,  positive ETCO2 and CO2 detector Secured at: 22 cm Tube secured with: Tape Dental Injury: Teeth and Oropharynx as per pre-operative assessment

## 2018-05-15 NOTE — Transfer of Care (Signed)
Immediate Anesthesia Transfer of Care Note  Patient: Carly [name]  Procedure(s) Performed: Arteriogram, Embolization of recurrent aneurysm (N/A )  Patient Location: PACU  Anesthesia Type:General  Level of Consciousness: awake, alert , drowsy and patient cooperative  Airway & Oxygen Therapy: Patient Spontanous Breathing and Patient connected to nasal cannula oxygen  Post-op Assessment: Report given to RN, Post -op Vital signs reviewed and stable and Patient moving all extremities X 4  Post vital signs: Reviewed and stable  Last Vitals:  Vitals Value Taken Time  BP 122/61 05/15/2018  1:34 PM  Temp    Pulse    Resp 10 05/15/2018  1:35 PM  SpO2    Vitals shown include unvalidated device data.  Last Pain:  Vitals:   05/15/18 0848  TempSrc:   PainSc: 0-No pain         Complications: No apparent anesthesia complications

## 2018-05-15 NOTE — Anesthesia Procedure Notes (Signed)
Arterial Line Insertion Start/End8/30/2019 10:35 AM, 05/15/2018 10:40 AM Performed by: Lynda Rainwater, MD, anesthesiologist  Patient location: OR. Preanesthetic checklist: patient identified, IV checked, site marked, risks and benefits discussed, surgical consent, monitors and equipment checked, pre-op evaluation, timeout performed and anesthesia consent Lidocaine 1% used for infiltration Left, radial was placed Catheter size: 20 Fr Hand hygiene performed , maximum sterile barriers used  and Seldinger technique used Allen's test indicative of satisfactory collateral circulation Attempts: 1 Procedure performed without using ultrasound guided technique. Following insertion, dressing applied and Biopatch. Post procedure assessment: normal and unchanged  Patient tolerated the procedure well with no immediate complications.

## 2018-05-15 NOTE — Progress Notes (Addendum)
  NEUROSURGERY PROGRESS NOTE   Pt seen in PACU postop. No c/o HA.  EXAM:  BP (!) 145/75 (BP Location: Right Arm)   Pulse 65   Temp (!) 97.5 F (36.4 C)   Resp 12   Ht 5\' 4"  (1.626 m)   Wt 113 kg   LMP 04/20/2018   SpO2 100%   BMI 42.76 kg/m   Awake, alert, oriented to person, hospital, not year  Speech fluent, but word-finding, naming, and repetition all impaired. CN grossly intact  5/5 BUE/BLE, no drift Right groin site c/d/i  IMPRESSION:  47 y.o. female s/p Surpass embolization LICA aneurysm. Mild aphasia postop but otherwise intact. Possibly distal MCA (M3-4) spasm, however with catheter positioning in the distal MCA certainly platelet activation/clot from endothelial injury is possible - Obesity based on BMI > 40  PLAN: - Will cont to monitor - Will keep IVF @ 150cc/hr - Will get head CT now to make sure no ICH, then start low-dose Integrilin - 116mcg/kg bolus followed by 11mcg/kg/min infusion x12 hrs.

## 2018-05-15 NOTE — Progress Notes (Signed)
Dr. Kathyrn Sheriff made aware of CT scan completed and increased pupil dilation. No new orders given. Integralin to be started in ICU.

## 2018-05-15 NOTE — H&P (Signed)
Chief Complaint   Aneurysm   HPI   HPI: Carly [name] is a 47 y.o. 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  She actually made an excellent recovery, and underwent follow-up diagnostic cerebral angiogram at the 1 year mark. Angiogram showed enlargement of residual aneurysm. She presents today for treatment. She is without any concerns.    Patient Active Problem List   Diagnosis Date Noted  . Palliative care encounter   . AKI (acute kidney injury) ([place])   . Adjustment disorder with mixed anxiety and depressed mood   . Dislodged gastrostomy tube ([place])   . Sepsis ([place])   . Hypokalemia   . Elevated temperature   . SIRS (systemic inflammatory response syndrome) ([facility])   . Decreased oral intake   . Cognitive deficit, post-stroke   . Prediabetes   . Labile blood pressure   . Transaminitis   . 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 Hydrocephalus   . Level of consciousness decreased   . SAH (subarachnoid hemorrhage) ([place])   . Tachycardia   . PEG (percutaneous endoscopic gastrostomy) status ([place])   . Acute blood loss anemia   . Tachypnea   . Ventilator dependent ([place])   . Palliative care by specialist   . Electrolyte imbalance 03/19/2017  . History of ETT   . Encounter for intubation   . Subarachnoid hemorrhage ([place])   . Acute respiratory failure ([place])   . Ruptured cerebral aneurysm ([place]) 03/03/2017  . Routine general medical examination at a health care facility 08/23/2013  . Essential hypertension, benign 08/23/2013  . Impaired fasting glucose 08/23/2013  . Plantar fasciitis, bilateral 08/23/2013  . Unspecified vitamin D deficiency 08/23/2013  . Obesity, unspecified 08/23/2013  . Unspecified essential hypertension 12/31/2012  . GERD (gastroesophageal reflux disease) 12/31/2012  . COMMON MIGRAINE 06/16/2008  . CHEST PAIN, INTERMITTENT 06/16/2008    PMH: Past  Medical History:  Diagnosis Date  . GERD (gastroesophageal reflux disease)   . Hypertension   . Memory difficulties    short term memory  . Migraines   . Seizures ([place])    with hemorrhage  . Subarachnoid hemorrhage ([facility])    d/t burst aneurysm, had seizures    PSH: Past Surgical History:  Procedure Laterality Date  . CESAREAN SECTION    . ESOPHAGOGASTRODUODENOSCOPY N/A 04/01/2017   Procedure: ESOPHAGOGASTRODUODENOSCOPY (EGD);  Surgeon: Georganna Skeans, MD;  Location: [facility];  Service: General;  Laterality: N/A;  . IR ANGIO INTRA EXTRACRAN SEL INTERNAL CAROTID UNI R MOD SED  03/03/2017  . IR ANGIO VERTEBRAL SEL SUBCLAVIAN INNOMINATE BILAT MOD SED  02/10/2018  . IR ANGIO VERTEBRAL SEL VERTEBRAL BILAT MOD SED  03/03/2017  . IR ANGIO VERTEBRAL SEL VERTEBRAL BILAT MOD SED  02/10/2018  . IR ANGIOGRAM FOLLOW UP STUDY  03/03/2017  . IR ANGIOGRAM FOLLOW UP STUDY  03/03/2017  . IR ANGIOGRAM FOLLOW UP STUDY  03/03/2017  . IR ANGIOGRAM FOLLOW UP STUDY  03/03/2017  . IR REPLC GASTRO/COLONIC TUBE PERCUT W/FLUORO  04/15/2017  . IR TRANSCATH/EMBOLIZ  03/03/2017  . LAPAROSCOPY     in early 90s  . PEG PLACEMENT N/A 04/01/2017   Procedure: PERCUTANEOUS ENDOSCOPIC GASTROSTOMY (PEG) PLACEMENT;  Surgeon: Georganna Skeans, MD;  Location: [facility];  Service: General;  Laterality: N/A;  . RADIOLOGY WITH ANESTHESIA N/A 03/03/2017   Procedure: RADIOLOGY WITH ANESTHESIA;  Surgeon: Consuella Lose, MD;  Location: [facility];  Service: Radiology;  Laterality: N/A;    Medications Prior to Admission  Medication Sig Dispense Refill Last Dose  . acetaminophen (TYLENOL) 500 MG tablet Take 1,000 mg by mouth every 6 (six) hours as needed (for pain.).     Marland Kitchen levETIRAcetam (KEPPRA) 1000 MG tablet Take 1 tablet (1,000 mg total) by mouth 2 (two) times daily. 60 tablet 0 02/09/2018 at 2000  . metoprolol tartrate (LOPRESSOR) 50 MG tablet Take 50 mg by mouth 2 (two) times daily.   02/09/2018 at 2000  . Multiple Vitamin  (MULTIVITAMIN WITH MINERALS) TABS tablet Take 1 tablet by mouth daily. (Patient taking differently: Take 1 tablet by mouth daily. Women's Ultra [employer] Multivitamin) 30 tablet 0 02/09/2018 at 0800  . aspirin 325 MG EC tablet Take 325 mg by mouth daily.  2   . clopidogrel (PLAVIX) 75 MG tablet Take 75 mg by mouth daily.  2     SH: Social History   Tobacco Use  . Smoking status: Never Smoker  . Smokeless tobacco: Never Used  Substance Use Topics  . Alcohol use: Not Currently    Comment: occ  . Drug use: No    MEDS: Prior to Admission medications   Medication Sig Start Date End Date Taking? Authorizing Provider  acetaminophen (TYLENOL) 500 MG tablet Take 1,000 mg by mouth every 6 (six) hours as needed (for pain.).   Yes [provider]  levETIRAcetam (KEPPRA) 1000 MG tablet Take 1 tablet (1,000 mg total) by mouth 2 (two) times daily. 04/23/17  Yes Love, Ivan Anchors, PA-C  metoprolol tartrate (LOPRESSOR) 50 MG tablet Take 50 mg by mouth 2 (two) times daily.   Yes [provider]  Multiple Vitamin (MULTIVITAMIN WITH MINERALS) TABS tablet Take 1 tablet by mouth daily. Patient taking differently: Take 1 tablet by mouth daily. Women's Ultra [employer] Multivitamin 04/24/17  Yes Love, Ivan Anchors, PA-C  aspirin 325 MG EC tablet Take 325 mg by mouth daily. 04/02/18   [provider]  clopidogrel (PLAVIX) 75 MG tablet Take 75 mg by mouth daily. 04/02/18   [provider]    ALLERGY: No Known Allergies  Social History   Tobacco Use  . Smoking status: Never Smoker  . Smokeless tobacco: Never Used  Substance Use Topics  . Alcohol use: Not Currently    Comment: occ     Family History  Problem Relation Age of Onset  . Diabetes Mother   . Hypertension Mother   . Hyperlipidemia Mother   . Cancer Father        colon cancer  . Diabetes Maternal Grandmother   . Hyperlipidemia Paternal Grandfather      ROS   ROS  Exam   There were no vitals filed for this  visit. General appearance: WDWN, NAD Eyes: PERRL, Fundoscopic: normal Cardiovascular: Regular rate and rhythm without murmurs, rubs, gallops. No edema or variciosities. Distal pulses normal. Pulmonary: Clear to auscultation Musculoskeletal:     Muscle tone upper extremities: Normal    Muscle tone lower extremities: Normal    Motor exam: Upper Extremities Deltoid Bicep Tricep Grip  Right 5/5 5/5 5/5 5/5  Left 5/5 5/5 5/5 5/5   Lower Extremity IP Quad PF DF EHL  Right 5/5 5/5 5/5 5/5 5/5  Left 5/5 5/5 5/5 5/5 5/5   Neurological Awake, alert, oriented Memory and concentration grossly intact Speech fluent, appropriate CNII: Visual fields normal CNIII/IV/VI: EOMI CNV: Facial sensation normal CNVII: Symmetric, normal strength CNVIII: Grossly normal CNIX: Normal palate movement CNXI: Trap and  SCM strength normal CN XII: Tongue protrusion normal Sensation grossly intact to LT DTR: Normal Coordination (finger/nose & heel/shin): Normal  Results - Imaging/Labs   No results found for this or any previous visit (from the past 48 hour(s)).  No results found. Diagnostic angiogram dated 02/10/2018 was again reviewed.  This demonstrates enlargement of a very small aneurysm residual, now measuring approximately 4 x 3 mm.   Impression/Plan   47 y.o. female with enlargement of a aneurysm residual approximately 1 year after coil embolization for aneurysm rupture.  Given her history of subarachnoid hemorrhage in relatively short time frame for residual enlargement, it was recommended that she undergo treatment with surpass endovascular flow diverter.  The risks of the procedure were explained in detail including risk of stroke or hemorrhage during or immediately after the procedure.  I also reviewed other risks of procedure including contrast reaction, nephropathy, or groin hematoma.   In addition, I talked to them about the general risks of anesthesia including heart attack, stroke, or  DVT/PE. I did tell her we would likely place the newly approved [employer] Surpass flow diverting stent.  The patient understood our discussion and is willing to proceed. All the patient's questions were answered.  She has been taking asa 325mg  and plavix 75mg  daily for the past 7 days in prep for treatment. Consent has been signed.

## 2018-05-16 LAB — CBC
HEMATOCRIT: 35.1 % — AB (ref 36.0–46.0)
Hemoglobin: 11.4 g/dL — ABNORMAL LOW (ref 12.0–15.0)
MCH: 27.4 pg (ref 26.0–34.0)
MCHC: 32.5 g/dL (ref 30.0–36.0)
MCV: 84.4 fL (ref 78.0–100.0)
PLATELETS: 174 10*3/uL (ref 150–400)
RBC: 4.16 MIL/uL (ref 3.87–5.11)
RDW: 13.8 % (ref 11.5–15.5)
WBC: 9.9 10*3/uL (ref 4.0–10.5)

## 2018-05-16 MED ORDER — LEVETIRACETAM 500 MG PO TABS
500.0000 mg | ORAL_TABLET | Freq: Once | ORAL | Status: AC
  Administered 2018-05-16: 500 mg via ORAL

## 2018-05-16 MED ORDER — LORAZEPAM 2 MG/ML IJ SOLN
1.0000 mg | INTRAMUSCULAR | Status: DC | PRN
  Administered 2018-05-17 (×2): 2 mg via INTRAVENOUS
  Filled 2018-05-16 (×3): qty 1

## 2018-05-16 NOTE — Progress Notes (Signed)
Notified Dr. Vertell Limber of pt vomiting x1. Zofran given as previously ordered. Will cont to monitor.

## 2018-05-16 NOTE — Progress Notes (Signed)
Patient had right sided twitching at 2310 after ambulating to bathroom. Twitching was limited to the RUE and right side of the face. Dr. Vertell Limber notified at 2320. New orders given for Keppra 500mg  now and Ativan 1-2mg  prn for sustained seizures. RN will continue to monitor.

## 2018-05-16 NOTE — Progress Notes (Signed)
Subjective: Patient reports aphasic  Objective: Vital signs in last 24 hours: Temp:  [97.4 F (36.3 C)-99.3 F (37.4 C)] 99.2 F (37.3 C) (08/31 0400) Pulse Rate:  [52-74] 56 (08/31 0900) Resp:  [6-17] 12 (08/31 0900) BP: (115-160)/(60-132) 150/82 (08/31 0900) SpO2:  [98 %-100 %] 100 % (08/31 0900) Arterial Line BP: (136-189)/(69-95) 138/75 (08/31 0700)  Intake/Output from previous day: 08/30 0701 - 08/31 0700 In: 2576.5 [I.V.:2326.5] Out: 6948 [Urine:3825; Blood:150] Intake/Output this shift: Total I/O In: 74.9 [I.V.:74.9] Out: 150 [Urine:150]  Physical Exam: Awake, alert, preferentially moving left arm, but will use right arm with encouragement.  Answers "yes" to questions, but does not state her name.  Will follow commands.  Has been up and walking. Moves eyes to either side.  Lab Results: Recent Labs    05/15/18 1809 05/16/18 0535  WBC 7.1 9.9  HGB 13.2 11.4*  HCT 40.2 35.1*  PLT 177 174   BMET Recent Labs    05/15/18 0841  NA 140  K 3.9  CL 108  CO2 16*  GLUCOSE 67*  BUN 15  CREATININE 1.16*  CALCIUM 9.0    Studies/Results: Ct Head Wo Contrast  Result Date: 05/15/2018 CLINICAL DATA:  Focal neuro deficit post embolization. Catheter angiogram yesterday. EXAM: CT HEAD WITHOUT CONTRAST TECHNIQUE: Contiguous axial images were obtained from the base of the skull through the vertex without intravenous contrast. COMPARISON:  CT head 05/27/2017 FINDINGS: Brain: Aneurysm coiling of left supraclinoid internal carotid artery unchanged. Interval placement of pipeline stent the left supraclinoid internal carotid artery today. This appears to be well-positioned. Ventricle size normal. No acute hemorrhage or mass. Chronic encephalomalacia left gyrus rectus unchanged from the prior CT. Negative for acute infarct. Vascular: Negative for hyperdense vessel Skull: Negative Sinuses/Orbits: Negative Other: None IMPRESSION: Aneurysm coiling left supraclinoid internal carotid  artery. Interval placement of pipeline stent in the left supraclinoid internal carotid artery. No acute intracranial abnormality. Negative for hemorrhage or acute infarct. Electronically Signed   By: Franchot Gallo M.[name].   On: 05/15/2018 17:14    Assessment/Plan: Expressive aphasia mostly, although also some receptive aphasia as well.  Some right neglect more than true weakness.  Continue Integrilin and support.  [name]/W patient's husband.  Dr. Kathyrn Sheriff may elect to obtain imaging if deficits persist.    LOS: 1 day    Carly Paul D, MD 05/16/2018, 10:15 AM

## 2018-05-17 ENCOUNTER — Inpatient Hospital Stay (HOSPITAL_COMMUNITY): Payer: 59

## 2018-05-17 DIAGNOSIS — J9690 Respiratory failure, unspecified, unspecified whether with hypoxia or hypercapnia: Secondary | ICD-10-CM

## 2018-05-17 DIAGNOSIS — R0689 Other abnormalities of breathing: Secondary | ICD-10-CM

## 2018-05-17 DIAGNOSIS — G40901 Epilepsy, unspecified, not intractable, with status epilepticus: Secondary | ICD-10-CM

## 2018-05-17 DIAGNOSIS — R569 Unspecified convulsions: Secondary | ICD-10-CM

## 2018-05-17 DIAGNOSIS — I609 Nontraumatic subarachnoid hemorrhage, unspecified: Secondary | ICD-10-CM

## 2018-05-17 LAB — BASIC METABOLIC PANEL
Anion gap: 9 (ref 5–15)
BUN: 5 mg/dL — ABNORMAL LOW (ref 6–20)
CALCIUM: 8.9 mg/dL (ref 8.9–10.3)
CO2: 21 mmol/L — AB (ref 22–32)
CREATININE: 0.92 mg/dL (ref 0.44–1.00)
Chloride: 113 mmol/L — ABNORMAL HIGH (ref 98–111)
GFR calc non Af Amer: >60 mL/min (ref >60)
Glucose, Bld: 96 mg/dL (ref 70–99)
Potassium: 4.3 mmol/L (ref 3.5–5.1)
Sodium: 143 mmol/L (ref 135–145)

## 2018-05-17 LAB — TRIGLYCERIDES: Triglycerides: 271 mg/dL — ABNORMAL HIGH (ref <150)

## 2018-05-17 LAB — PHENYTOIN LEVEL, TOTAL: Phenytoin Lvl: 18.2 ug/mL (ref 10.0–20.0)

## 2018-05-17 MED ORDER — ROCURONIUM BROMIDE 50 MG/5ML IV SOLN
100.0000 mg | Freq: Once | INTRAVENOUS | Status: AC
  Administered 2018-05-17: 100 mg via INTRAVENOUS
  Filled 2018-05-17: qty 10

## 2018-05-17 MED ORDER — FENTANYL CITRATE (PF) 100 MCG/2ML IJ SOLN
50.0000 ug | Freq: Once | INTRAMUSCULAR | Status: AC
  Administered 2018-05-17: 50 ug via INTRAVENOUS

## 2018-05-17 MED ORDER — LEVETIRACETAM 750 MG PO TABS
1250.0000 mg | ORAL_TABLET | Freq: Two times a day (BID) | ORAL | Status: DC
  Administered 2018-05-17: 1250 mg via ORAL
  Filled 2018-05-17: qty 1

## 2018-05-17 MED ORDER — DOCUSATE SODIUM 50 MG/5ML PO LIQD
100.0000 mg | Freq: Two times a day (BID) | ORAL | Status: DC | PRN
  Administered 2018-05-19 – 2018-05-21 (×3): 100 mg
  Filled 2018-05-17 (×5): qty 10

## 2018-05-17 MED ORDER — PHENYLEPHRINE HCL-NACL 10-0.9 MG/250ML-% IV SOLN
0.0000 ug/min | INTRAVENOUS | Status: DC
  Administered 2018-05-17: 20 ug/min via INTRAVENOUS
  Administered 2018-05-18: 40 ug/min via INTRAVENOUS
  Administered 2018-05-18: 30 ug/min via INTRAVENOUS
  Administered 2018-05-18: 55 ug/min via INTRAVENOUS
  Administered 2018-05-18: 50 ug/min via INTRAVENOUS
  Administered 2018-05-18: 30 ug/min via INTRAVENOUS
  Administered 2018-05-19: 20 ug/min via INTRAVENOUS
  Administered 2018-05-19 (×2): 50 ug/min via INTRAVENOUS
  Administered 2018-05-19: 45 ug/min via INTRAVENOUS
  Administered 2018-05-20: 10 ug/min via INTRAVENOUS
  Filled 2018-05-17 (×12): qty 250

## 2018-05-17 MED ORDER — LEVETIRACETAM IN NACL 1000 MG/100ML IV SOLN
1000.0000 mg | Freq: Two times a day (BID) | INTRAVENOUS | Status: DC
  Administered 2018-05-17: 1000 mg via INTRAVENOUS
  Filled 2018-05-17 (×2): qty 100

## 2018-05-17 MED ORDER — MIDAZOLAM HCL 2 MG/2ML IJ SOLN
INTRAMUSCULAR | Status: AC
  Administered 2018-05-17: 2 mg via INTRAVENOUS
  Filled 2018-05-17: qty 2

## 2018-05-17 MED ORDER — OXCARBAZEPINE 300 MG PO TABS
600.0000 mg | ORAL_TABLET | Freq: Two times a day (BID) | ORAL | Status: DC
  Administered 2018-05-17 – 2018-05-21 (×9): 600 mg via ORAL
  Filled 2018-05-17 (×11): qty 2

## 2018-05-17 MED ORDER — MIDAZOLAM HCL 2 MG/2ML IJ SOLN
2.0000 mg | Freq: Once | INTRAMUSCULAR | Status: AC
  Administered 2018-05-17: 2 mg via INTRAVENOUS
  Filled 2018-05-17: qty 2

## 2018-05-17 MED ORDER — ORAL CARE MOUTH RINSE: 15.0000 mL | OROMUCOSAL | Status: DC

## 2018-05-17 MED ORDER — PHENYTOIN SODIUM EXTENDED 100 MG PO CAPS: 100.0000 mg | ORAL_CAPSULE | Freq: Two times a day (BID) | ORAL | Status: DC

## 2018-05-17 MED ORDER — PROPOFOL 1000 MG/100ML IV EMUL
0.0000 ug/kg/min | INTRAVENOUS | Status: DC
  Administered 2018-05-17: 20 ug/kg/min via INTRAVENOUS

## 2018-05-17 MED ORDER — SODIUM CHLORIDE 0.9 % IV SOLN
200.0000 mg | Freq: Once | INTRAVENOUS | Status: AC
  Administered 2018-05-17: 200 mg via INTRAVENOUS
  Filled 2018-05-17: qty 4

## 2018-05-17 MED ORDER — PROPOFOL 1000 MG/100ML IV EMUL
INTRAVENOUS | Status: AC
  Filled 2018-05-17: qty 100

## 2018-05-17 MED ORDER — PROPOFOL 1000 MG/100ML IV EMUL
5.0000 ug/kg/min | INTRAVENOUS | Status: DC
  Administered 2018-05-17 (×2): 50 ug/kg/min via INTRAVENOUS
  Administered 2018-05-18: 80 ug/kg/min via INTRAVENOUS
  Administered 2018-05-18: 50 ug/kg/min via INTRAVENOUS
  Administered 2018-05-18: 60 ug/kg/min via INTRAVENOUS
  Administered 2018-05-18: 50 ug/kg/min via INTRAVENOUS
  Administered 2018-05-18: 80 ug/kg/min via INTRAVENOUS
  Administered 2018-05-18: 50 ug/kg/min via INTRAVENOUS
  Administered 2018-05-18 (×2): 80 ug/kg/min via INTRAVENOUS
  Administered 2018-05-18: 50 ug/kg/min via INTRAVENOUS
  Administered 2018-05-19: 80 ug/kg/min via INTRAVENOUS
  Administered 2018-05-19: 50 ug/kg/min via INTRAVENOUS
  Administered 2018-05-19: 80 ug/kg/min via INTRAVENOUS
  Administered 2018-05-19: 70 ug/kg/min via INTRAVENOUS
  Filled 2018-05-17 (×14): qty 100

## 2018-05-17 MED ORDER — LEVETIRACETAM 750 MG PO TABS: 1500.0000 mg | ORAL_TABLET | Freq: Two times a day (BID) | ORAL | Status: DC

## 2018-05-17 MED ORDER — FENTANYL CITRATE (PF) 100 MCG/2ML IJ SOLN
INTRAMUSCULAR | Status: AC
  Administered 2018-05-17: 50 ug via INTRAVENOUS
  Filled 2018-05-17: qty 2

## 2018-05-17 MED ORDER — MIDAZOLAM BOLUS VIA INFUSION
1.0000 mg | INTRAVENOUS | Status: DC | PRN
  Administered 2018-05-20 (×2): 2 mg via INTRAVENOUS
  Filled 2018-05-17: qty 2

## 2018-05-17 MED ORDER — SODIUM CHLORIDE 0.9 % IV SOLN
1500.0000 mg | Freq: Once | INTRAVENOUS | Status: AC
  Administered 2018-05-17: 1500 mg via INTRAVENOUS
  Filled 2018-05-17: qty 30

## 2018-05-17 MED ORDER — LEVETIRACETAM IN NACL 1500 MG/100ML IV SOLN
1500.0000 mg | Freq: Two times a day (BID) | INTRAVENOUS | Status: DC
  Administered 2018-05-17 – 2018-05-25 (×17): 1500 mg via INTRAVENOUS
  Filled 2018-05-17 (×18): qty 100

## 2018-05-17 MED ORDER — CHLORHEXIDINE GLUCONATE 0.12% ORAL RINSE (MEDLINE KIT)
15.0000 mL | Freq: Two times a day (BID) | OROMUCOSAL | Status: DC
  Administered 2018-05-17 – 2018-05-22 (×9): 15 mL via OROMUCOSAL

## 2018-05-17 MED ORDER — PHENYTOIN SODIUM 50 MG/ML IJ SOLN
100.0000 mg | Freq: Three times a day (TID) | INTRAMUSCULAR | Status: DC
  Administered 2018-05-17 – 2018-05-21 (×12): 100 mg via INTRAVENOUS
  Filled 2018-05-17 (×12): qty 2

## 2018-05-17 MED ORDER — CHLORHEXIDINE GLUCONATE 0.12% ORAL RINSE (MEDLINE KIT)
15.0000 mL | Freq: Two times a day (BID) | OROMUCOSAL | Status: DC
  Administered 2018-05-18 – 2018-05-19 (×2): 15 mL via OROMUCOSAL

## 2018-05-17 MED ORDER — ORAL CARE MOUTH RINSE
15.0000 mL | OROMUCOSAL | Status: DC
  Administered 2018-05-17 – 2018-05-22 (×51): 15 mL via OROMUCOSAL

## 2018-05-17 MED ORDER — VALPROATE SODIUM 500 MG/5ML IV SOLN
2000.0000 mg | Freq: Once | INTRAVENOUS | Status: AC
  Administered 2018-05-17: 2000 mg via INTRAVENOUS
  Filled 2018-05-17: qty 20

## 2018-05-17 MED ORDER — ETOMIDATE 2 MG/ML IV SOLN
20.0000 mg | Freq: Once | INTRAVENOUS | Status: AC
  Administered 2018-05-17: 20 mg via INTRAVENOUS

## 2018-05-17 MED ORDER — SODIUM CHLORIDE 0.9 % IV BOLUS
1000.0000 mL | Freq: Once | INTRAVENOUS | Status: AC
  Administered 2018-05-17: 1000 mL via INTRAVENOUS

## 2018-05-17 MED ORDER — SODIUM CHLORIDE 0.9 % IV SOLN
10.0000 mg/h | INTRAVENOUS | Status: DC
  Administered 2018-05-19 (×3): 10 mg/h via INTRAVENOUS
  Filled 2018-05-17 (×4): qty 10

## 2018-05-17 NOTE — Progress Notes (Signed)
CTH stable. Consider MRI when OK with primary team.  Rest of the recs as in consult.

## 2018-05-17 NOTE — Progress Notes (Signed)
CXR results called in to Dr. Oletta Darter with CCM. Advised to pull ET tube back 3cm. Respiratory notified. New CXR will be ordered. RN will continue to monitor.

## 2018-05-17 NOTE — Progress Notes (Signed)
  NEUROSURGERY PROGRESS NOTE   Pt seen and examined. Had several SZ yesterday including generalized T-C. Just given ativan this am for another.  EXAM: Temp:  [98.5 F (36.9 C)-98.9 F (37.2 C)] 98.7 F (37.1 C) (09/01 1229) Pulse Rate:  [46-136] 56 (09/01 1540) Resp:  [10-21] 16 (09/01 1540) BP: (107-159)/(62-136) 146/70 (09/01 1535) SpO2:  [95 %-100 %] 100 % (09/01 1540) Intake/Output      08/31 0701 - 09/01 0700 09/01 0701 - 09/02 0700   P.O. 280    I.V. (mL/kg) 970 (8.6) 299.9 (2.7)   Other     IV Piggyback 79.9    Total Intake(mL/kg) 1330 (11.8) 299.9 (2.7)   Urine (mL/kg/hr) 1730 (0.6) 1000 (1)   Blood     Total Output 1730 1000   Net -400 -700.2        Urine Occurrence 5 x 1 x    Somnolent but arouses Follows simple commands MAE  LABS: Lab Results  Component Value Date   CREATININE 0.92 05/17/2018   BUN 5 (L) 05/17/2018   NA 143 05/17/2018   K 4.3 05/17/2018   CL 113 (H) 05/17/2018   CO2 21 (L) 05/17/2018   Lab Results  Component Value Date   WBC 9.9 05/16/2018   HGB 11.4 (L) 05/16/2018   HCT 35.1 (L) 05/16/2018   MCV 84.4 05/16/2018   PLT 174 05/16/2018    IMAGING: CT this am reviewed, no new findings. No hemorrhage  IMPRESSION: - 47 y.o. female s/p Surpass (flow diverter) embolization of recurrent left ICA aneurysm. Suspect small distal left MCA stroke postop as the cause of subsequent SZ. No hemorrhage on imaging. Don't really see the benefits of advanced imaging at this point.  PLAN: - Spoke with neurology, will get longer term EEG for subclinical SZ - Has already been on LEV, PHT, will likely start vimpat. - Cont to mobilize as tolerated - Daily ASA/Plavix

## 2018-05-17 NOTE — Progress Notes (Signed)
eLink Physician-Brief Progress Note Patient Name: Carly [name] DOB: [birth year] MRN: [id number]   Date of Service  05/17/2018  HPI/Events of Note  Review of post intubation CXR reveals ETT tip in R mainstem bronchus.   eICU Interventions  Will order: 1. Pull ETT back 3 cm and re-secure ETT.  2. Repeat portable CXR after repositioning ETT.      Intervention Category Major Interventions: Other:  Lysle Dingwall 05/17/2018, 8:50 PM

## 2018-05-17 NOTE — Progress Notes (Signed)
eLink Physician-Brief Progress Note Patient Name: Carly [name] DOB: 11-Nov-1970 MRN: [id number]   Date of Service  05/17/2018  HPI/Events of Note  Presently on a Propofol IV infusion for burst suppression. Now hypotensive - BP = 97/60. No CVL or CVP.  eICU Interventions  Will order:  1. Bolus with 0.9 NaCl 1 liter IV over 1 hour now.  2. Phenylephrine IV infusion. Titrate to MAP > 65.     Intervention Category Major Interventions: Hypotension - evaluation and management  Carie Kapuscinski Eugene 05/17/2018, 9:51 PM

## 2018-05-17 NOTE — Progress Notes (Signed)
Patient transported to CT with Nurse tech. Tonic Clonic seizure began at 0424 and ended at 0425. Patient did not have any complications during the seizure. Patient calm and resting. CT completed. RN will continue to monitor.

## 2018-05-17 NOTE — Progress Notes (Signed)
SLP Cancellation Note  Patient Details Name: CAELIE REMSBURG MRN: [id number] DOB: 04-17-1971   Cancelled treatment:       Reason Eval/Treat Not Completed: Medical issues which prohibited therapy . Will f/u for cognitive assessment when seizures are controlled.   Boden Stucky, Katherene Ponto 05/17/2018, 9:24 AM

## 2018-05-17 NOTE — Progress Notes (Signed)
PT Cancellation Note  Patient Details Name: Carly [name] MRN: [id number] DOB: 01/20/1971   Cancelled Treatment:    Reason Eval/Treat Not Completed: Medical issues which prohibited therapy(pt with seizures and awaiting medical clearance)   Khamiya Varin B Yacoub Diltz 05/17/2018, 8:09 AM  Elwyn Reach, PT Acute Rehabilitation Services Pager: [phone] Office: [phone]

## 2018-05-17 NOTE — Consult Note (Addendum)
Neurology Consultation  Reason for Consult: Seizures Referring Physician: Dr. Francis Dowse  CC: Seizure activity  History is obtained from: Patient's husband, chart, patient's RN.  HPI: Carly [name] is a 46 y.o. 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 The patient underwent the procedure on 05/15/2018 with following residual right hemiparesis and intermittent expressive and receptive aphasia.  She was given antiplatelets-Integrilin for 12 hours with concern for thrombus formation from endothelial injury due to the procedure which was discontinued yesterday. Tonight around little after midnight, she started having some seizure-like activity involving the right upper extremity and right face that lasted for about 1 minute.  It subsided spontaneously.  Then again around 3:30 AM, she had 2-minute episode of generalized tonic-clonic body shaking.  On-call neurosurgeon was notified.  Additional 500 mg of Keppra was given.  She is on Keppra 1000 twice daily and has been on Keppra since her subarachnoid hemorrhage in June 2018. Neurology consultation was obtained for recommendations on antiepileptics.  LKW: Sometime in the morning of 05/15/2018 when she presented for angiogram tpa given?: no, outside the window Premorbid modified Rankin scale (mRS):2  ROS: History provided by the husband and chart review, unable to obtain review of systems from patient due to altered mental status.   Past Medical History:  Diagnosis Date  . GERD (gastroesophageal reflux disease)   . Hypertension   . Memory difficulties    short term memory  . Migraines   .  Seizures ([place])    with hemorrhage  . Subarachnoid hemorrhage ([facility])    d/t burst aneurysm, had seizures   Family History  Problem Relation Age of Onset  . Diabetes Mother   . Hypertension Mother   . Hyperlipidemia Mother   . Cancer Father        colon cancer  . Diabetes Maternal Grandmother   . Hyperlipidemia Paternal Grandfather    Social History:   reports that she has never smoked. She has never used smokeless tobacco. She reports that she drank alcohol. She reports that she does not use drugs.  Medications  Current Facility-Administered Medications:  .  0.9 %  sodium chloride infusion, , Intravenous, Continuous, Consuella Lose, MD, Last Rate: 75 mL/hr at 05/17/18 0300 .  0.9 %  sodium chloride infusion, , Intravenous, Continuous, [name], Neelesh, MD .  acetaminophen (TYLENOL) tablet 1,000 mg, 1,000 mg, Oral, Q6H PRN, Consuella Lose, MD .  aspirin EC tablet 325 mg, 325 mg, Oral, Daily, Consuella Lose, MD, 325 mg at 05/16/18 0928 .  clopidogrel (PLAVIX) tablet 75 mg, 75 mg, Oral, Daily, Consuella Lose, MD, 75 mg at 05/16/18 0927 .  fosPHENYtoin (CEREBYX) 1,500 mg PE in sodium chloride 0.9 % 50 mL IVPB, 1,500 mg PE, Intravenous, Once, Amie Portland, MD .  HYDROcodone-acetaminophen (NORCO/VICODIN) 5-325 MG per tablet 1 tablet, 1 tablet, Oral, Q4H PRN, Consuella Lose, MD, 1 tablet at 05/16/18 2336 .  labetalol (NORMODYNE,TRANDATE) injection 10-40 mg, 10-40 mg, Intravenous, Q10 min PRN, Consuella Lose, MD, 10 mg at 05/15/18 1819 .  levETIRAcetam (KEPPRA) tablet 1,000 mg, 1,000 mg, Oral, BID, Consuella Lose, MD, 1,000 mg at 05/16/18 2206 .  LORazepam (ATIVAN) injection 1-2 mg, 1-2 mg, Intravenous, Q4H PRN, Erline Levine, MD .  metoprolol tartrate (LOPRESSOR) tablet 50 mg, 50 mg, Oral, BID, Consuella Lose, MD, Stopped at 05/16/18 2209 .  morphine 2 MG/ML injection 1-2 mg, 1-2 mg, Intravenous, Q1H PRN, Erline Levine, MD, 2 mg at 05/15/18 2305 .   multivitamin with minerals tablet 1 tablet, 1 tablet, Oral, Daily, Consuella Lose, MD, 1 tablet at 05/16/18 0927 .  ondansetron (ZOFRAN) tablet 4 mg, 4 mg, Oral, Q4H PRN **OR** ondansetron (ZOFRAN) injection 4 mg, 4 mg, Intravenous, Q4H PRN, Consuella Lose, MD, 4 mg at 05/16/18 1032 .  pantoprazole (PROTONIX) EC tablet 20 mg, 20 mg, Oral, Daily, Consuella Lose, MD, 20 mg at 05/16/18 7824  Exam: Current vital signs: BP (!) 147/81   Pulse (!) 48   Temp 98.5 F (36.9 C) (Axillary)   Resp 14   Ht 5\' 4"  (1.626 m)   Wt 113 kg   LMP 04/20/2018   SpO2 100%   BMI 42.76 kg/m  Vital signs in last 24 hours: Temp:  [98.5 F (36.9 C)-99.3 F (37.4 C)] 98.5 F (36.9 C) (09/01 0344) Pulse Rate:  [46-136] 48 (09/01 0100) Resp:  [6-21] 14 (09/01 0100) BP: (116-159)/(64-136) 147/81 (09/01 0100) SpO2:  [98 %-100 %] 100 % (09/01 0100) Arterial Line BP: (138-176)/(75-84) 138/75 (08/31 0700) General: Patient's awake, alert in no apparent distress. H ENT: Normocephalic atraumatic dry mucous memories Lungs: Clear to auscultation CVS: S1-S2 heard, regular rate rhythm Abdomen: Soft nondistended nontender Extremities: Warm well perfused Neurological exam Patient is awake, alert, tracks the examiner. Upon asking her any question, she perseverates with a "yes" response to most questions. She is unable to tell me her name.  She is unable to repeat.  She is unable to follow simple commands but would mimic some actions and upon lifting her leg and instructing her to raise with actions and speech she was able to keep her leg above the bed as below. Difficult to discern whether there is any dysarthria. Cranial nerves: Pupils are equal round reactive to light, extraocular movements are intact, she response to threat bilaterally, subtle right nasal labial fold flattening Motor exam: Right upper extremity has vertical drift with 4/5 strength.  Right lower extremity does not have any vertical drift but  is mildly weaker than the left side at 4+/5 strength.  Left upper and left lower extremity are 5/5 strength without any drift. Sensory exam: Intact to light touch all over.  Possibly some right-sided neglect. Cerebellar: Did not follow commands consistently to check for finger-nose-finger testing or heel-knee-shin testing. Gait testing was deferred due to her mental status. NIHSS 1a Level of Conscious.: 0 1b LOC Questions: 2 1c LOC Commands: 2 2 Best Gaze: 0 3 Visual: 0 4 Facial Palsy: 1 5a Motor Arm - left: 0 5b Motor Arm - Right: 1 6a Motor Leg - Left: 0 6b Motor Leg - Right: 0 7 Limb Ataxia: 0 8 Sensory: 0 9 Best Language: 2 10 Dysarthria: 0 11 Extinct. and Inatten.: 1 TOTAL: 9  Labs I have reviewed labs in epic and the results pertinent to this consultation are: CBC    Component Value Date/Time   WBC 9.9 05/16/2018 0535   RBC 4.16 05/16/2018 0535   HGB 11.4 (L) 05/16/2018 0535   HCT 35.1 (L) 05/16/2018 0535   PLT 174 05/16/2018 0535   MCV 84.4 05/16/2018 0535   MCV 79.5 (A) 02/25/2017 1354   MCH 27.4 05/16/2018 0535   MCHC 32.5 05/16/2018 0535   RDW 13.8 05/16/2018 0535   RDW 14.3 07/23/2013  1316   LYMPHSABS 1.7 05/15/2018 0841   LYMPHSABS 2.2 07/23/2013 1316   MONOABS 0.6 05/15/2018 0841   EOSABS 0.1 05/15/2018 0841   EOSABS 0.0 07/23/2013 1316   BASOSABS 0.0 05/15/2018 0841   BASOSABS 0.0 07/23/2013 1316   CMP     Component Value Date/Time   NA 140 05/15/2018 0841   NA 143 02/25/2017 1352   K 3.9 05/15/2018 0841   CL 108 05/15/2018 0841   CO2 16 (L) 05/15/2018 0841   GLUCOSE 67 (L) 05/15/2018 0841   BUN 15 05/15/2018 0841   BUN 9 02/25/2017 1352   CREATININE 1.16 (H) 05/15/2018 0841   CALCIUM 9.0 05/15/2018 0841   PROT 7.0 04/28/2017 1111   PROT 6.7 02/25/2017 1352   ALBUMIN 3.6 04/28/2017 1111   ALBUMIN 4.1 02/25/2017 1352   AST 45 (H) 04/28/2017 1111   ALT 26 04/28/2017 1111   ALKPHOS 114 04/28/2017 1111   BILITOT 1.1 04/28/2017 1111    BILITOT 0.2 02/25/2017 1352   GFRNONAA 54 (L) 05/15/2018 0841   GFRAA >60 05/15/2018 0841   Imaging I have reviewed the images obtained:  CT-scan of the brain-05/15/2018 showed 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  No acute endocrine abnormality otherwise.  No acute hemorrhage or infarct.  Assessment:  47 year old woman with a past medical history of subarachnoid hemorrhage due to a left supraclinoid ICA in 2018 status post embolization seen for seizures. She was admitted after her one year follow-up showed increase in the size of the aneurysm and required pipeline stenting, following the procedure she developed right-sided hemiparesis and aphasia. Tonight she exhibited 2 focal seizures and one generalized tonic clonic seizures lasting less than 2 minutes and not requiring benzodiazepine-spontaneously resolved. She has likely suffered a stroke, peri-procedurally and has a lowered seizure threshold because of the prior subarachnoid as well as possibly related to new embolic strokes that might be cortical. I would recommend further imaging by an MRI when okay with the primary team.  Further recs detailed below  Impression: Evaluate for stroke Evaluate for seizure H/O [facility] Recent pipeline stent for LICA aneurysm  Recommendations: Increase Keppra to 1250 mg twice daily Loaded with fosphenytoin 20 mg phenytoin equivalent per KG x1.  Decision to continue Dilantin based on clinical course, EEG and imaging results. Obtain routine EEG in the morning MRI of the brain without contrast when okay with the primary team I will repeat a CTH to ensure no acute change/bleed PRN Ativan for seizure lasting longer than 5 minutes.  Please call neurology at that time as well. Maintain seizure precautions Check for any underlying infections that might have contributed to lowering of seizure threshold- chest  x-ray and urinalysis Check CBC BMP in the morning Neurology will continue to follow with you. -- Amie Portland, MD [facility] Pager: [phone] If 7pm to 7am, please call on call as listed on AMION.  CRITICAL CARE ATTESTATION This patient is critically ill and at significant risk of neurological worsening, death and care requires constant monitoring of vital signs, hemodynamics,respiratory and cardiac monitoring. I spent 35  minutes of neurocritical care time performing neurological assessment, discussion with family, other specialists and medical decision making of high complexityin the care of  this patient.

## 2018-05-17 NOTE — Procedures (Signed)
Intubation Procedure Note Carly [name] [id number] 10/21/1970  Procedure: Intubation Indications: Airway protection and maintenance  Procedure Details Consent: Risks of procedure as well as the alternatives and risks of each were explained to the (patient/caregiver).  Consent for procedure obtained. Time Out: Verified patient identification, verified procedure, site/side was marked, verified correct patient position, special equipment/implants available, medications/allergies/relevent history reviewed, required imaging and test results available.  Performed  Maximum sterile technique was used including gloves and mask.  MAC and 3 size 7.5 tube was inserted with use of glide scope from first attempt there was no complication patient tolerated the procedure very well there is good color change and good air sound bilaterally    Evaluation Hemodynamic Status: BP stable throughout; O2 sats: stable throughout Patient's Current Condition: stable Complications: No apparent complications Patient did tolerate procedure well. Chest X-ray ordered to verify placement.  CXR: pending.   Carly [name] 05/17/2018

## 2018-05-17 NOTE — Progress Notes (Addendum)
NEURO HOSPITALIST PROGRESS NOTE   Subjective: Patient awake, alert, eyes open, in bed eating breakfast, NAD. Patient feeling nauseous, zofran given. RN reported 1 seizure at 0857 that last approximately 1 min. No ativan given. It was during her bath, not tonic clonic. It was more rhythmic abdominal movement and right gaze deviation. Patient able to respond to with "uh huh" or "uh uh " during seizure. No arm a or leg involvement. 0919 patient had a second seizure. Same as described above. Right gaze deviation proceeded by 4 beats of nystagmus. Episode lasted about 15 seconds same as described above, broken by 2 mg ativan administration. Patient still able to respond with "uh huh". No family at bedside at this time. No tongue bites or incontinence noted. EEG not connected at this time. RN has also noted that patient has been having episodes of bradycardia into the 30's. Currently HR of 52-54. Was 61 during seizure.   Exam: Vitals:   05/17/18 0700 05/17/18 0722  BP: 121/62   Pulse: (!) 50   Resp: 13   Temp:  98.9 F (37.2 C)  SpO2: 99%     Physical Exam   HEENT-  Normocephalic, no lesions, without obvious abnormality.  Normal external eye and conjunctiva.   Cardiovascular- S1-S2 audible, pulses palpable throughout   Lungs-no rhonchi or wheezing noted, no excessive working breathing.  Saturations within normal limits on RA Extremities- Warm, dry and intact Musculoskeletal-no joint tenderness, deformity or swelling Skin-warm and dry, no hyperpigmentation, vitiligo, or suspicious lesions   Neuro:  Mental Status: Alert. Patient unable to state name. Only will respond with "uh huh" or " uh uh". Able to follow pen light with her eyes. When I asked her to raise her left arm and tapped it she did.right arm I had to raise for her and then she held it up. Then when I asked her to raise her legs should raise her arms again. Globally aphasic. Unable to tell me her name. Stated " uh  huh" when asked if she was at home. Can not tell if any dysarthria is present as she is not really speaking. Able to keep legs above the bed when examiner raised her legs first, then she would mimic and do herself.   Cranial Nerves: EOEMI, PERRL 6-34mm. 2-3 beats of slight nystagmus noted with right gaze and upward gaze bilaterally. More prominent in right eye. Blinks to threat.  Motor: Right : Upper extremity   4/5    Left:     Upper extremity   5/5  Lower extremity   4/5     Lower extremity   5/5 Tone and bulk:normal tone throughout; no atrophy noted Sensory:  light touch intact throughout, bilaterally Deep Tendon Reflexes: 2+ and symmetric biceps, patella Plantars: Right: downgoing   Left: downgoing Cerebellar: UTA d/t patient inability  to follow commands Gait: UTA    Medications:  Scheduled: . aspirin  325 mg Oral Daily  . clopidogrel  75 mg Oral Daily  . levETIRAcetam  1,250 mg Oral BID  . metoprolol tartrate  50 mg Oral BID  . multivitamin with minerals  1 tablet Oral Daily  . pantoprazole  20 mg Oral Daily   Continuous: . sodium chloride 75 mL/hr at 05/17/18 0800  . sodium chloride     YKD:[id number], HYDROcodone-acetaminophen, labetalol, LORazepam, morphine injection, ondansetron **OR** ondansetron (ZOFRAN) IV  Pertinent Labs/Diagnostics: Hgb:  11.4 ( trending down) HCT: 35.1 ( trending down) UA: pending  Dg Chest 1 View  Result Date: 05/17/2018 CLINICAL DATA:  Cough EXAM: CHEST  1 VIEW COMPARISON:  None. FINDINGS: Normal mediastinum and cardiac silhouette. Normal pulmonary vasculature. No evidence of effusion, infiltrate, or pneumothorax. No acute bony abnormality. IMPRESSION: No acute cardiopulmonary process. Electronically Signed   By: Suzy Bouchard M.D.   On: 05/17/2018 08:44   Ct Head Wo Contrast  Result Date: 05/17/2018 CLINICAL DATA:  47 year old female with focal neurologic deficit status post embolization. Follow-up. EXAM: CT HEAD WITHOUT CONTRAST  TECHNIQUE: Contiguous axial images were obtained from the base of the skull through the vertex without intravenous contrast. COMPARISON:  Head CT dated 05/15/2018 FINDINGS: Brain: The ventricles and sulci appropriate size for patient's age. 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 There is no acute intracranial hemorrhage. No mass effect or midline shift. Rectus Vascular: Left supraclinoid ICA aneurysm coil and stent similar to prior CT. There is associated streak artifact. Skull: Normal. Negative for fracture or focal lesion. Sinuses/Orbits: No acute finding. Other: None IMPRESSION: 1. No acute intracranial hemorrhage.  No interval change. 2. Left supraclinoid ICA coiling and vascular stent similar to prior CT. 3. 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 Electronically Signed   By: Anner Crete M.D.   On: 05/17/2018 05:08   Ct Head Wo Contrast  Result Date: 05/15/2018 CLINICAL DATA:  Focal neuro deficit post embolization. Catheter angiogram yesterday. EXAM: CT HEAD WITHOUT CONTRAST TECHNIQUE: Contiguous axial images were obtained from the base of the skull through the vertex without intravenous contrast. COMPARISON:  CT head 05/27/2017 FINDINGS: Brain: Aneurysm coiling of left supraclinoid internal carotid artery unchanged. Interval placement of pipeline stent the left supraclinoid internal carotid artery today. This appears to be well-positioned. Ventricle size normal. No acute hemorrhage or mass. Chronic encephalomalacia left gyrus rectus unchanged from the prior CT. Negative for acute infarct. Vascular: Negative for hyperdense vessel Skull: Negative Sinuses/Orbits: Negative Other: None IMPRESSION: Aneurysm coiling left supraclinoid internal carotid artery. Interval placement of pipeline stent in the left supraclinoid internal carotid artery. No acute intracranial abnormality. Negative for hemorrhage or acute infarct. Electronically Signed   By: Franchot Gallo M.D.   On:  05/15/2018 17:14     Laurey Morale, MSN, NP-C [facility] [phone]  Attending neurologist's note to follow   NEUROHOSPITALIST ADDENDUM Performed a face to face diagnostic evaluation.   I have reviewed the contents of history and physical exam as documented by PA/ARNP/Resident and agree with above documentation.  I have discussed and formulated the above plan as documented below.     ASSESMENT AND PLAN  47 year old woman with a past medical history of subarachnoid hemorrhage due to a left supraclinoid ICA in 2018 status post embolization seen for seizures. She was admitted after her one year follow-up showed increase in the size of the aneurysm and required pipeline stenting, following the procedure she developed right-sided hemiparesis and aphasia. Yesterday night she exhibited 2 focal seizures and one generalized tonic clonic seizures lasting less than 2 minutes and not requiring benzodiazepine-spontaneously resolved. Was loaded with Dilantin and Keppra dose increased. Today EEG showed patient had more clinical seizures and EEG showed partial status epilepticus.    Impression: Partial status epilepticus  Recent pipeline stent for LICA aneurysm  Recommendations:  -- EEG showed 4 left hemispheric subclinical seizures -- LTM EEG ordered, patient had greater than >30 seizures with continuous left epileptogenic dysfunction  --increase Keppra to 1500  mg twice daily - resume Dilantin 100mg  TID - loaded with Valproic acid 2g once - considered adding Vimpat ( holding off at this time d/t bradycardia) - started Trileptal 600mg  TID --MRI of the brain without contrast after seizures controlled  --Check for any underlying infections that might have contributed to lowering of seizure threshold  Due to continued seizures, discussed with husband risk vs benefit of intubation for seizure control. Will be intubating the patient to achieve burst suppression.   05/17/2018,  8:40 AM    Karena Addison Zaley Talley MD [facility] [phone]    This patient is neurologically critically ill due to  Partial Status epilepticus . He is at risk of neurological worsening from worsening seizures, aspiration, infection, respiratory failure, sudden death.  Due to continued seizure activity she is being intubated and at risk for complications of intubation and prolonged ICU stay.   This patient's care requires constant monitoring of vital signs, hemodynamics, respiratory and cardiac monitoring, review of multiple databases, neurological assessment, discussion with family, other specialists and medical decision making of high complexity.  I spent 75 minutes of neurocritical time in the care of this patient.        If 7pm to 7am, please call on call as listed on AMION.

## 2018-05-17 NOTE — Progress Notes (Signed)
Prelim EEG in burst suppression after reaching dose of propofol at 50/h. Propofol at 50/h currentlty. C/W AEDs and prop at current dose. Pressor support per PCCM. Appreciate assistance in BP management  Neurology will follow with you  -- Amie Portland, MD [facility] Pager: [phone] If 7pm to 7am, please call on call as listed on [facility].

## 2018-05-17 NOTE — Procedures (Signed)
Date of recording 05/17/2018  Referring physician Rory Percy  Reason for the study Seizure  Technical Digital EEG recording using 10-20 international electrode system  Description of the recording When awake posterior dominant rhythm is between 4-7Hz  symmetrical reactive Spike-wave/sharp wave discharges maximum over T5 and T3 electrodes At least 4 left hemispheric subclinical seizures, paroxysmal fast activity arising/localizing towards 01 and T5  electrodes followed by rhythmic delta intermixed with sharp wave activity lasting 1 to 2 minutes   Impression The EEG isabnormalare findings are suggestive of left hemispheric epileptogenic dysfunction. At least 4 left hemispheric subclinical seizures recorded.

## 2018-05-17 NOTE — Progress Notes (Signed)
Patient had 3rd seizure of the night lasting for 33min from 0330-0332 The seizures are tonic clonic involving upper and lower extremities. Dr. Vertell Limber notified at [place]. Patient is resting. RN will continue to monitor.

## 2018-05-17 NOTE — Progress Notes (Signed)
PT had a routine spot EEG. After EEG was complete she was started on LTM EEG

## 2018-05-17 NOTE — Progress Notes (Signed)
Intubated. Started on propofol-Increase to 40/h gradually.  No sz but not burst suppressed yet on EEG. Discussed with RN Will follow.  Amie Portland MD

## 2018-05-17 NOTE — Consult Note (Signed)
PULMONARY / CRITICAL CARE MEDICINE   Name: Carly [name] MRN: [id number] DOB: 04/14/1971    ADMISSION DATE:  05/15/2018 CONSULTATION DATE: 05/17/2018 REFERRING MD: Neurosurgery CHIEF COMPLAINT: Status epilepticus  HISTORY OF PRESENT ILLNESS:   Ms. 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  I came to assess the patient patient does open her eyes not her head to commands but is aphasic able to move her left side.  Discussed with the family at bedside and agreed with the plan.  Obviously not able to take any history from the patient due to her aphasia.  PAST MEDICAL HISTORY :  She  has a past medical history of GERD (gastroesophageal reflux disease), Hypertension, Memory difficulties, Migraines, Seizures ([place]), and Subarachnoid hemorrhage ([place]).  PAST SURGICAL HISTORY: She  has a past surgical history that includes Cesarean section; Radiology with anesthesia (N/A, 03/03/2017); IR Angiogram Follow Up Study (03/03/2017); IR Angiogram Follow Up Study (03/03/2017); IR ANGIO INTRA EXTRACRAN SEL INTERNAL CAROTID UNI R MOD SED (03/03/2017); IR Angiogram Follow Up Study (03/03/2017); IR Transcath/Emboliz (03/03/2017); IR Angiogram Follow Up Study (03/03/2017); IR ANGIO VERTEBRAL SEL VERTEBRAL BILAT MOD SED (03/03/2017); Esophagogastroduodenoscopy (N/A, 04/01/2017); PEG placement (N/A, 04/01/2017); IR Replc Gastro/Colonic Tube Percut W/Fluoro (04/15/2017); IR ANGIO VERTEBRAL SEL VERTEBRAL  BILAT MOD SED (02/10/2018); IR ANGIO VERTEBRAL SEL SUBCLAVIAN INNOMINATE BILAT MOD SED (02/10/2018); and laparoscopy.  No Known Allergies  No current facility-administered medications on file prior to encounter.    Current Outpatient Medications on File Prior to Encounter  Medication Sig  . acetaminophen (TYLENOL) 500 MG tablet Take 1,000 mg by mouth every 6 (six) hours as needed (for pain.).  Marland Kitchen aspirin 325 MG EC tablet Take 325 mg by mouth daily.  . clopidogrel (PLAVIX) 75 MG tablet Take 75 mg by mouth daily.  Marland Kitchen levETIRAcetam (KEPPRA) 1000 MG tablet Take 1 tablet (1,000 mg total) by mouth 2 (two) times daily.  . metoprolol tartrate (LOPRESSOR) 50 MG tablet Take 50 mg by mouth 2 (two) times daily.  . Multiple Vitamin (MULTIVITAMIN WITH MINERALS) TABS tablet Take 1 tablet by mouth daily. (Patient taking differently: Take 1 tablet by mouth daily. Women's Ultra G[employer] Multivitamin)    FAMILY HISTORY:  Her family history includes Cancer in her father; Diabetes in her maternal grandmother and mother; Hyperlipidemia in her mother and paternal grandfather; Hypertension in her mother.  SOCIAL HISTORY: She  reports that she has never smoked. She has never used smokeless tobacco. She reports that she drank alcohol. She reports that she does not use drugs.  REVIEW OF SYSTEMS:   Could not be obtained due to patient altered mental status  SUBJECTIVE:    VITAL SIGNS: BP (!) 111/58   Pulse (!) 57   Temp 97.6 F (36.4 C) (Axillary)   Resp 15   Ht 5\' 4"  (1.626 m)   Wt 113 kg   LMP 04/20/2018   SpO2 98%   BMI 42.76 kg/m   HEMODYNAMICS:    VENTILATOR SETTINGS: Vent Mode: PRVC FiO2 (%):  [  100 %] 100 % Set Rate:  [16 bmp] 16 bmp Vt Set:  [440 mL] 440 mL PEEP:  [5 cmH20] 5 cmH20 Plateau Pressure:  [15 cmH20] 15 cmH20  INTAKE / OUTPUT: I/O last 3 completed shifts: In: 1629.8 [P.O.:280; I.V.:1269.9; IV Piggyback:79.9] Out: 3430 [Urine:3430]  PHYSICAL EXAMINATION: General: Acutely ill  phasic Neuro: Aphasic moving left side nodding her head to commands HEENT: Moist  cardiovascular: Normal heart sound no sounds or murmurs Lungs: Clear to auscultation bilaterally no crackles no wheezing Abdomen: Soft no tenderness no organomegaly Musculoskeletal: No edema Skin: No rash  LABS:  BMET Recent Labs  Lab 05/15/18 0841 05/17/18 0953  NA 140 143  K 3.9 4.3  CL 108 113*  CO2 16* 21*  BUN 15 5*  CREATININE 1.16* 0.92  GLUCOSE 67* 96    Electrolytes Recent Labs  Lab 05/15/18 0841 05/17/18 0953  CALCIUM 9.0 8.9    CBC Recent Labs  Lab 05/15/18 0841 05/15/18 1809 05/16/18 0535  WBC 6.2 7.1 9.9  HGB 13.2 13.2 11.4*  HCT 41.6 40.2 35.1*  PLT 178 177 174    Coag's Recent Labs  Lab 05/15/18 0841  APTT 25  INR 1.01    Sepsis Markers No results for input(s): LATICACIDVEN, PROCALCITON, O2SATVEN in the last 168 hours.  ABG No results for input(s): PHART, PCO2ART, PO2ART in the last 168 hours.  Liver Enzymes No results for input(s): AST, ALT, ALKPHOS, BILITOT, ALBUMIN in the last 168 hours.  Cardiac Enzymes Recent Labs  Lab 05/15/18 1809  TROPONINI <0.03    Glucose No results for input(s): GLUCAP in the last 168 hours.  Imaging Dg Chest 1 View  Result Date: 05/17/2018 CLINICAL DATA:  Cough EXAM: CHEST  1 VIEW COMPARISON:  None. FINDINGS: Normal mediastinum and cardiac silhouette. Normal pulmonary vasculature. No evidence of effusion, infiltrate, or pneumothorax. No acute bony abnormality. IMPRESSION: No acute cardiopulmonary process. Electronically Signed   By: Suzy Bouchard M.D.   On: 05/17/2018 08:44   Ct Head Wo Contrast  Result Date: 05/17/2018 CLINICAL DATA:  47 year old female with focal neurologic deficit status post embolization. Follow-up. EXAM: CT HEAD WITHOUT CONTRAST TECHNIQUE: Contiguous axial images were obtained from the base of the skull through the vertex without intravenous contrast. COMPARISON:  Head CT dated 05/15/2018  FINDINGS: Brain: The ventricles and sulci appropriate size for patient's age. 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 There is no acute intracranial hemorrhage. No mass effect or midline shift. Rectus Vascular: Left supraclinoid ICA aneurysm coil and stent similar to prior CT. There is associated streak artifact. Skull: Normal. Negative for fracture or focal lesion. Sinuses/Orbits: No acute finding. Other: None IMPRESSION: 1. No acute intracranial hemorrhage.  No interval change. 2. Left supraclinoid ICA coiling and vascular stent similar to prior CT. 3. 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 Electronically Signed   By: Anner Crete M.D.   On: 05/17/2018 05:08     STUDIES:    CULTURES:   ANTIBIOTICS: None  SIGNIFICANT EVENTS: Pipeline stenting May 15, 2018 Hemiparesis right side post procedure Endotracheal intubation May 17, 2018  LINES/TUBES:  DISCUSSION: Patient has a history of subarachnoid hemorrhage status post embolization in June 2018 coming in for follow-up status post pipeline stenting August 30 followed by right-sided hemiparesis and aphasia now with recurrent seizures  ASSESSMENT / PLAN:  PULMONARY A: Acute respiratory insufficiency requiring mechanical ventilation P:   VAP protocols Keep intubated sedated Stat chest x-ray after intubation  CARDIOVASCULAR A:  No issues P:  Blood pressure as per neurosurgery  RENAL A:   No issues P:   Follow urine output and renal function  GASTROINTESTINAL A:   No issues P:   Insert OG tube N.p.o. for now other than medicines  HEMATOLOGIC A:   No issues P:  Transfuse if hemoglobin less than 7  INFECTIOUS A:   No issues P:   No antibiotics  ENDOCRINE A:   No issues P:   Follow glucose levels  NEUROLOGIC A:   History of subarachnoid hemorrhage status post embolization 2018 Came in for pipeline stenting May 15, 2018 Acute right hemiparesis  postprocedure Aphasia Status epilepticus  P:   RASS goal: -5 Start propofol and Versed Continuous EEG Dilantin Keppra Antiseizure as per neurology We appreciate neurology and neurosurgery follow-up and input    FAMILY  - Updates: Husband updated at bedside  - Inter-disciplinary family meet or Palliative Care meeting due by: 05/24/2018    Pulmonary and [place] Pager: [phone]  05/17/2018, 7:47 PM

## 2018-05-17 NOTE — Progress Notes (Signed)
Pt has had 2 witnessed seizures this morning.  First seizure was at 0857 and lasted approximately 1 minute; patient was shaking in her abdomen and had eye twitching with a R gaze.  RN went to get ativan and seizure had stopped.  Second seizure lasted approximately 30 seconds and stopped with ativan. Pt was responsive throughout seizure.  Janett Billow, NP at bedside.  She notified Dr. Lorraine Lax of this.  Pt getting EEG at this time.    Also notified Janett Billow, NP and Dr. Kathyrn Sheriff of bradycardia and pause @ [phone].  BMP ordered stat.  May get cards consult if need be.  Will watch patient closely.

## 2018-05-17 NOTE — Progress Notes (Signed)
Patient had seizure like activity on the right side beginning at [place] and ending at 54. Activity was limited to RUE and face, this time more intense lasting for 45min. No medication was needed as the patient came out of it 10min later without any complications. Patient is resting. RN will continue to monitor

## 2018-05-18 ENCOUNTER — Inpatient Hospital Stay: Payer: Self-pay

## 2018-05-18 DIAGNOSIS — J9600 Acute respiratory failure, unspecified whether with hypoxia or hypercapnia: Secondary | ICD-10-CM

## 2018-05-18 LAB — URINALYSIS, ROUTINE W REFLEX MICROSCOPIC
Bilirubin Urine: NEGATIVE
GLUCOSE, UA: NEGATIVE mg/dL
Hgb urine dipstick: NEGATIVE
Ketones, ur: NEGATIVE mg/dL
Leukocytes, UA: NEGATIVE
Nitrite: NEGATIVE
PH: 7 (ref 5.0–8.0)
PROTEIN: NEGATIVE mg/dL
Specific Gravity, Urine: 1.008 (ref 1.005–1.030)

## 2018-05-18 LAB — PHOSPHORUS
PHOSPHORUS: 2.6 mg/dL (ref 2.5–4.6)
Phosphorus: 2.4 mg/dL — ABNORMAL LOW (ref 2.5–4.6)

## 2018-05-18 LAB — ALBUMIN: ALBUMIN: 2.9 g/dL — AB (ref 3.5–5.0)

## 2018-05-18 LAB — MAGNESIUM
MAGNESIUM: 2 mg/dL (ref 1.7–2.4)
Magnesium: 2 mg/dL (ref 1.7–2.4)

## 2018-05-18 MED ORDER — CHLORHEXIDINE GLUCONATE CLOTH 2 % EX PADS
6.0000 | MEDICATED_PAD | Freq: Every day | CUTANEOUS | Status: DC
  Administered 2018-05-18 – 2018-05-28 (×11): 6 via TOPICAL

## 2018-05-18 MED ORDER — VITAL HIGH PROTEIN PO LIQD
1000.0000 mL | ORAL | Status: DC
  Administered 2018-05-18: 1000 mL

## 2018-05-18 MED ORDER — SODIUM CHLORIDE 0.9% FLUSH
10.0000 mL | Freq: Two times a day (BID) | INTRAVENOUS | Status: DC
  Administered 2018-05-18 – 2018-05-21 (×7): 10 mL
  Administered 2018-05-22: 30 mL
  Administered 2018-05-22: 20 mL
  Administered 2018-05-24 – 2018-05-28 (×9): 10 mL

## 2018-05-18 MED ORDER — SODIUM CHLORIDE 0.9% FLUSH: 10.0000 mL | INTRAVENOUS | Status: DC | PRN

## 2018-05-18 MED ORDER — PRO-STAT SUGAR FREE PO LIQD
60.0000 mL | Freq: Four times a day (QID) | ORAL | Status: DC
  Administered 2018-05-18 – 2018-05-19 (×5): 60 mL
  Filled 2018-05-18 (×5): qty 60

## 2018-05-18 MED ORDER — PANTOPRAZOLE SODIUM 40 MG PO PACK
40.0000 mg | PACK | Freq: Every day | ORAL | Status: DC
  Administered 2018-05-18 – 2018-05-24 (×7): 40 mg
  Filled 2018-05-18 (×6): qty 20

## 2018-05-18 NOTE — Progress Notes (Signed)
OT Cancellation Note  Patient Details Name: Carly [name] MRN: [id number] DOB: 24-Dec-1970   Cancelled Treatment:    Reason Eval/Treat Not Completed: Medical issues which prohibited therapy. Intubated and sedated. Will follow and perform evaluation when medically appropriate.   [place] 05/18/2018, 8:38 AM  Hulda Humphrey OTR/L Acute Rehabilitation Services Pager: [phone] Office: [phone]

## 2018-05-18 NOTE — Progress Notes (Signed)
Peripherally Inserted Central Catheter/Midline Placement  The IV Nurse has discussed with the patient and/or persons authorized to consent for the patient, the purpose of this procedure and the potential benefits and risks involved with this procedure.  The benefits include less needle sticks, lab draws from the catheter, and the patient may be discharged home with the catheter. Risks include, but not limited to, infection, bleeding, blood clot (thrombus formation), and puncture of an artery; nerve damage and irregular heartbeat and possibility to perform a PICC exchange if needed/ordered by physician.  Alternatives to this procedure were also discussed.  Bard Power PICC patient education guide, fact sheet on infection prevention and patient information card has been provided to patient /or left at bedside.    PICC/Midline Placement Documentation  PICC Triple Lumen 61/44/31 PICC Right Basilic 40 cm (Active)  Indication for Insertion or Continuance of Line Vasoactive infusions 05/18/2018  6:00 PM  Site Assessment Clean;Dry;Intact 05/18/2018  6:00 PM  Lumen #1 Status Flushed;Blood return noted 05/18/2018  6:00 PM  Lumen #2 Status Flushed;Blood return noted 05/18/2018  6:00 PM  Lumen #3 Status Flushed;Blood return noted 05/18/2018  6:00 PM  Dressing Type Transparent 05/18/2018  6:00 PM  Dressing Status Clean;Dry;Intact;Antimicrobial disc in place 05/18/2018  6:00 PM  Dressing Intervention New dressing 05/18/2018  6:00 PM  Dressing Change Due 05/25/18 05/18/2018  6:00 PM       Jule Economy [place] 05/18/2018, 6:35 PM

## 2018-05-18 NOTE — Progress Notes (Signed)
Subjective: No seizures since yesterday around 7pm.   Exam: Vitals:   05/18/18 0800 05/18/18 0810  BP:  138/74  Pulse:  (!) 42  Resp:  16  Temp:    SpO2: 100% 100%   Gen: In bed, intubated Resp: ventilated.  Abd: soft, nt  Neuro: Limited by burst suppression, pupils reactive.   Pertinent Labs: Dilantin 18.2 Cr 0.92  Impression: 47 yo  F with recurrent refractory seizures amounting to status epilepticus which was refractory to 4 AEDs. Given the degree of deficit, the decision was made to proceed with burst suppression with propofol. She is currently well suppressed with non-epileptiform appearing bursts. Would continue suppression for at least 24 hours, last seizure at 7pm.   Recommendations: 1) Dilantin 100mg  TID, check albumin level.  2) Burst suppression, will conitnue for 24-48 hours, likley wean tomorrow AM.  3) continue keppra 1500mg  BID 4) continue trileptal 600mg  BID, level tomorrow.   This patient is critically ill and at significant risk of neurological worsening, death and care requires constant monitoring of vital signs, hemodynamics,respiratory and cardiac monitoring, neurological assessment, discussion with family, other specialists and medical decision making of high complexity. I spent 40 minutes of neurocritical care time  in the care of  this patient.  Roland Rack, MD [facility] [phone]  If 7pm- 7am, please page neurology on call as listed in [place]. 05/18/2018  9:31 AM

## 2018-05-18 NOTE — Progress Notes (Signed)
eLink Physician-Brief Progress Note Patient Name: JOYOUS GLEGHORN DOB: 06/02/1971 MRN: [id number]   Date of Service  05/18/2018  HPI/Events of Note  Oliguria - Bladder scan with > 999 mL.   eICU Interventions  Will order: 1. I/O Cath PRN.      Intervention Category Intermediate Interventions: Oliguria - evaluation and management  Kitty Cadavid Eugene 05/18/2018, 4:17 AM

## 2018-05-18 NOTE — Progress Notes (Signed)
Dear Doctor: Carly [name] This patient has been identified as a candidate for PICC for the following reason (s): IV therapy over 48 hours, drug pH or osmolality (causing phlebitis, infiltration in 24 hours), drug extravasation potential with tissue necrosis (KCL, Dilantin, Dopamine, CaCl, MgSO4, chemo vesicant) and poor veins/poor circulatory system (CHF, COPD, emphysema, diabetes, steroid use, IV drug abuse, etc.) If you agree, please write an order for the indicated device. For any questions contact the Vascular Access Team at [phone] if no answer, please leave a message.  Thank you for supporting the early vascular access assessment program.

## 2018-05-18 NOTE — Progress Notes (Signed)
  NEUROSURGERY PROGRESS NOTE   Pt cont to have subclinical SZ despite addition of multiple AED yesterday, requiring intubation and burst suppression with propofol.  EXAM:  BP 138/74   Pulse (!) 42   Temp (!) 97.5 F (36.4 C) (Axillary)   Resp 16   Ht 5\' 4"  (1.626 m)   Wt 113 kg   LMP 04/20/2018   SpO2 100%   BMI 42.76 kg/m   Intubated, sedated on propofol, under burst suprression  IMPRESSION:  47 y.o. female with postop status epilepticus requiring burst suprression  PLAN: - Per neurology, cont AEDs (Keppra, Trileptal, Dilantin) and attempt to lighten propofol tomorrow am - Cont ASA/Plavix

## 2018-05-18 NOTE — Procedures (Signed)
Electroencephalography report.  Long-term monitoring.  Recording begins 05/17/2018 at 10:27 AM Recording ends 05/18/2018 at 7:30 AM  Day 1 CPT 95951  Date acquisition: International 10-20 for eligible placement.  Additional EKG channel.  This EEG was requested for this patient with known cerebral aneurysm present with generalized tonic-clonic seizures.  During the first half of the recording left hemispheric electrographic seizures present with onset in the left occipital cortex was gradual propagation and involving adjacent left hemispheric cortex until resolution.  Seizure lasted approximately between 2 to 3 minutes.  Seizures gradually decreasing frequency.  Occasionally left hemispheric periodic lateralized epileptiform discharges also present with maximum negativity in the left posterior cortex in between overt electrographic seizures.  Last seizure around 7 PM.  Patient is intubated and sedated with achievement of burst suppression pattern.   Clinical interpretation: This 20 hours of intensive EEG monitoring with simultaneous video monitoring recorded multiple left hemispheric electrographic seizures with electrographic onset in the left occipital cortex with gradual involvement of adjacent left hemispheric cortex with seizure resolution around 7 PM.  At this point background activities marked by burst suppression pattern.  These findings suggestive of cortical irritability in the left occipital cortex.  clinical correlation is advised.

## 2018-05-18 NOTE — Progress Notes (Signed)
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  I came to assess the patient patient does open her eyes not her head to commands but is aphasic able to move her left side.  Discussed with the family at bedside and agreed with the plan.  Obviously not able to take any history from the patient due to her aphasia.   SUBJECTIVE:  Remains sedated/burst suppression  Hypothermic on bair hugger  Remains on pressor support  Bradycardia improving  Urinary retention, foley placed with Good UOP   VITAL SIGNS: BP (!) 90/54   Pulse (!) 46   Temp (!) 95.4 F (35.2 C)   Resp 16   Ht 5\' 4"  (1.626 m)   Wt 113 kg   LMP 04/20/2018   SpO2 100%   BMI 42.76 kg/m   HEMODYNAMICS:    VENTILATOR SETTINGS: Vent Mode: PRVC FiO2 (%):  [30 %-100 %] 30 % Set Rate:  [16 bmp] 16 bmp Vt Set:  [440 mL] 440 mL PEEP:  [5 cmH20] 5 cmH20 Plateau Pressure:  [15 cmH20-17 cmH20] 16 cmH20  INTAKE / OUTPUT: I/O last 3 completed shifts: In: 4439.4 [P.O.:230; I.V.:2856.6; IV Piggyback:1352.8] Out: 4980 [Urine:4980]  PHYSICAL EXAMINATION: General: sedated on vent  Neuro: sedated/burst suppression, EEG  HEENT: ETT  cardiovascular:  bradycardia , no mr/r/g  Lungs: CTA , decreased BS in bases  Abdomen: soft , BS +  Musculoskeletal: no edema  Skin: intact without rash   LABS:  BMET Recent Labs  Lab 05/15/18 0841 05/17/18 0953  NA 140 143  K 3.9 4.3  CL 108 113*  CO2 16* 21*  BUN 15 5*  CREATININE 1.16* 0.92  GLUCOSE 67* 96    Electrolytes Recent Labs  Lab 05/15/18 0841 05/17/18 0953  CALCIUM 9.0 8.9    CBC Recent Labs  Lab 05/15/18 0841 05/15/18 1809 05/16/18 0535  WBC 6.2 7.1 9.9  HGB 13.2 13.2 11.4*  HCT 41.6 40.2 35.1*  PLT 178 177 174    Coag's Recent Labs  Lab 05/15/18 0841  APTT 25  INR 1.01    Sepsis Markers No results for input(s): LATICACIDVEN, PROCALCITON, O2SATVEN in the last 168 hours.  ABG No results for input(s): PHART, PCO2ART, PO2ART in the last 168 hours.  Liver Enzymes Recent Labs  Lab 05/18/18 0920  ALBUMIN 2.9*    Cardiac Enzymes Recent Labs  Lab 05/15/18 1809  TROPONINI <0.03    Glucose No results for input(s): GLUCAP in the last 168 hours.  Imaging Dg Abd 1 View  Result Date: 05/17/2018 CLINICAL DATA:  47 year old female status post orogastric tube placement. EXAM: ABDOMEN - 1 VIEW COMPARISON:  No priors. FINDINGS: Enteric tube tip in the antral pre-pyloric region of the stomach. Visualized bowel gas pattern is nonobstructive. IMPRESSION: 1. Enteric  tube tip is in the antral pre-pyloric region of the stomach. Electronically Signed   By: Vinnie Langton M.D.   On: 05/17/2018 20:43   Dg Chest Port 1 View  Result Date: 05/17/2018 CLINICAL DATA:  Endotracheal tube adjustment. EXAM: PORTABLE CHEST 1 VIEW COMPARISON:  Chest radiograph May 17, 2018 at 1958 hours FINDINGS: Endotracheal tube tip projects 2.7 cm above the carina. Nasogastric tube looped in proximal stomach, distal tip past proximal stomach. Cardiomediastinal silhouette is normal. No pleural effusions or focal consolidations. Trachea projects midline and there is no pneumothorax. Soft  tissue planes and included osseous structures are non-suspicious. IMPRESSION: Retracted endotracheal tube tip projects 2.7 cm above the carina. Nasogastric tube past proximal stomach. No acute cardiopulmonary process. Electronically Signed   By: Elon Alas M.D.   On: 05/17/2018 22:00   Dg Chest Port 1 View  Result Date: 05/17/2018 CLINICAL DATA:  47 year old female status post intubation. EXAM: PORTABLE CHEST 1 VIEW COMPARISON:  Chest x-ray 05/17/2018. FINDINGS: An endotracheal tube is in place with tip just beyond the carina in the right mainstem bronchus. Lung volumes are low. No consolidative airspace disease. No pleural effusions. No pneumothorax. No pulmonary nodule or mass noted. Pulmonary vasculature and the cardiomediastinal silhouette are within normal limits. IMPRESSION: 1. Right mainstem bronchus intubation. Endotracheal tube should be withdrawn 4.5 cm for more optimal placement. 2. Low lung volumes without radiographic evidence of acute cardiopulmonary disease. These results will be called to the ordering clinician or representative by the Radiologist Assistant, and communication documented in the PACS or zVision Dashboard. Electronically Signed   By: Vinnie Langton M.D.   On: 05/17/2018 20:24     STUDIES:    CULTURES:   ANTIBIOTICS: None  SIGNIFICANT EVENTS: Pipeline stenting May 15, 2018 Hemiparesis right side post procedure Endotracheal intubation May 17, 2018  LINES/TUBES:  DISCUSSION: Patient has a history of subarachnoid hemorrhage status post embolization in June 2018 coming in for follow-up status post pipeline stenting August 30 followed by right-sided hemiparesis and aphasia now with recurrent seizures  ASSESSMENT / PLAN:  PULMONARY A: Acute respiratory insufficiency requiring mechanical ventilation P:   VAP protocols Keep intubated sedated Daily SBT, eval for weaning  CXR in am  Check abg now   CARDIOVASCULAR A:  Hypotention after  intubation ?hypovolemia  Bradycardia  P:  Wean pressor/Neo for MAP >65  D/c metoprolol  Order for PICC line  Cont IVF at 75cc /hr   RENAL A:    P:   Follow urine output and renal function Replace electrolytes   GASTROINTESTINAL A:   Nutrition  P:   Begin TF   HEMATOLOGIC A:   No issues P:  Transfuse if hemoglobin less than 7  INFECTIOUS A:   No active issues P:   No antibiotics Tr wbc/temp   ENDOCRINE A:   No issues P:   Follow glucose levels  NEUROLOGIC A:   History of subarachnoid hemorrhage status post embolization 2018 Came in for pipeline stenting May 15, 2018 Acute right hemiparesis postprocedure Aphasia Status epilepticus  P:   RASS goal: -5 Start propofol and Versed Continuous EEG Dilantin Keppra Trileptal  Antiseizure as per neurology We appreciate neurology and neurosurgery follow-up and input    FAMILY  - Updates: Husband updated at bedside 9/1, no family at bedside this am   - Inter-disciplinary family meet or Palliative Care meeting due by: 05/24/2018   Rexene Edison NP -C  Pulmonary and [place] Pager: [phone]  05/18/2018, 12:59 PM

## 2018-05-18 NOTE — Progress Notes (Addendum)
Initial Nutrition Assessment  DOCUMENTATION CODES:   Morbid obesity  INTERVENTION:   Addendum: consult to initiate TF received. Orders placed as listed below:  If pt remains intubated, recommend intiation of TF: - Vital High Protein @ 10 ml/hr (240 ml/day) - 60 ml Pro-stat QID - recommend liquid MVI as TF regimen does not meet 100% of RDIs  Recommended tube feeding regimen provides 1040 kcal, 141 grams of protein, and 202 ml of H2O.   Tube feeding regimen and current propofol provides 1935 total kcal (122% of needs).  NUTRITION DIAGNOSIS:   Inadequate oral intake related to inability to eat as evidenced by NPO status.  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  8/30 - s/p surpass embolization LICA aneurysm 1/61 - seizure-like activity 9/1 - neurology consulted, seizure-like activity persists, intubated for burst suppression  Pt on high-dose propofol for burst suppression. Pt with continuous EEG. Discussed pt with RN. Pt on [employer].  Pt's weight appears to be up 71 lbs since 02/10/18. Unsure of accuracy of weights in chart. PTA, pt's weight fluctuated between 177.5-249 lbs over the past 1 year. RD to use measured weight on admission to estimate kcal needs and will monitor weight for changes.  Patient is currently intubated on ventilator support. Pt with OG tube in stomach. MV: 6.9 L/min Temp (24hrs), Avg:95.3 F (35.2 C), Min:91 F (32.8 C), Max:98.4 F (36.9 C) BP: 100/65 MAP: 76  Propofol: 33.9 ml/hr (provides 895 kcal/day) Neosynephrine: 45 ml/hr IVF: 75 ml/hr  Medications reviewed and include: MVI with minerals daily, 20 mg  Protonix daily, Keppra  Labs reviewed: CO2 21 (L), triglycerides 271 (H), hemoglobin 11.4 (L), HCT 35.1 (L)  UOP: 3400 ml x 24 hours (1.3 ml/kg/hr)  I/O's: -1.1 L since admission  NUTRITION - FOCUSED PHYSICAL EXAM:    Most Recent Value  Orbital Region  No depletion  Upper Arm Region  No depletion  Thoracic and Lumbar Region  No depletion  Buccal Region  Unable to assess  Temple Region  No depletion  Clavicle Bone Region  No depletion  Clavicle and Acromion Bone Region  No depletion  Scapular Bone Region  Unable to assess  Dorsal Hand  No depletion  Patellar Region  No depletion  Anterior Thigh Region  No depletion  Posterior Calf Region  No depletion  Edema (RD Assessment)  Mild  Hair  Reviewed  Eyes  Unable to assess  Mouth  Unable to assess  Skin  Reviewed  Nails  Reviewed       Diet Order:   Diet Order    None      EDUCATION NEEDS:   No education needs have been identified at this time  Skin:  Skin Assessment: Skin Integrity Issues: Incisions: R groin  Last BM:  no documented BM  Height:   Ht Readings from Last 1 Encounters:  05/15/18 5\' 4"  (1.626 m)    Weight:   Wt Readings from Last 1 Encounters:  05/15/18 113 kg    Ideal Body Weight:  54.55 kg  BMI:  Body mass index is 42.76 kg/m.  Estimated Nutritional Needs:   Kcal:  0960-4540  Protein:  >/= 136 grams  Fluid:  >/= 1.5 L    Gaynell Face, MS, RD, LDN Pager: [phone] Weekend/After Hours: [phone]

## 2018-05-18 NOTE — Progress Notes (Signed)
SLP Cancellation Note  Patient Details Name: Carly [name] MRN: [id number] DOB: 1971-09-08   Cancelled treatment:       Reason Eval/Treat Not Completed: Medical issues which prohibited therapy; pt intubated.  Will follow along for readiness.   [name] 05/18/2018, 9:57 AM

## 2018-05-18 NOTE — Progress Notes (Signed)
PT Cancellation Note  Patient Details Name: Carly [name] MRN: [id number] DOB: [birth year]   Cancelled Treatment:    Reason Eval/Treat Not Completed: Medical issues which prohibited therapy(pt with seizures and awaiting medical clearance)   Duncan Dull 05/18/2018, 9:32 AM Alben Deeds, PT DPT  Board Certified Neurologic Specialist [phone]

## 2018-05-19 ENCOUNTER — Encounter (HOSPITAL_COMMUNITY): Payer: Self-pay | Admitting: Neurosurgery

## 2018-05-19 ENCOUNTER — Inpatient Hospital Stay (HOSPITAL_COMMUNITY): Payer: 59

## 2018-05-19 DIAGNOSIS — J9601 Acute respiratory failure with hypoxia: Secondary | ICD-10-CM

## 2018-05-19 LAB — CBC
HCT: 34 % — ABNORMAL LOW (ref 36.0–46.0)
Hemoglobin: 10.5 g/dL — ABNORMAL LOW (ref 12.0–15.0)
MCH: 27.3 pg (ref 26.0–34.0)
MCHC: 30.9 g/dL (ref 30.0–36.0)
MCV: 88.5 fL (ref 78.0–100.0)
Platelets: 157 10*3/uL (ref 150–400)
RBC: 3.84 MIL/uL — ABNORMAL LOW (ref 3.87–5.11)
RDW: 14.7 % (ref 11.5–15.5)
WBC: 6.4 10*3/uL (ref 4.0–10.5)

## 2018-05-19 LAB — POCT I-STAT 3, ART BLOOD GAS (G3+)
ACID-BASE EXCESS: 3 mmol/L — AB (ref 0.0–2.0)
Bicarbonate: 27.9 mmol/L (ref 20.0–28.0)
O2 SAT: 100 %
TCO2: 29 mmol/L (ref 22–32)
pCO2 arterial: 43 mmHg (ref 32.0–48.0)
pH, Arterial: 7.421 (ref 7.350–7.450)
pO2, Arterial: 544 mmHg — ABNORMAL HIGH (ref 83.0–108.0)

## 2018-05-19 LAB — GLUCOSE, CAPILLARY
GLUCOSE-CAPILLARY: 77 mg/dL (ref 70–99)
GLUCOSE-CAPILLARY: 102 mg/dL — AB (ref 70–99)
Glucose-Capillary: 75 mg/dL (ref 70–99)
Glucose-Capillary: 83 mg/dL (ref 70–99)
Glucose-Capillary: 85 mg/dL (ref 70–99)
Glucose-Capillary: 87 mg/dL (ref 70–99)
Glucose-Capillary: 88 mg/dL (ref 70–99)
Glucose-Capillary: 75 mg/dL (ref 70–99)

## 2018-05-19 LAB — BASIC METABOLIC PANEL
Anion gap: 4 — ABNORMAL LOW (ref 5–15)
BUN: 9 mg/dL (ref 6–20)
CALCIUM: 7.5 mg/dL — AB (ref 8.9–10.3)
CO2: 25 mmol/L (ref 22–32)
CREATININE: 0.98 mg/dL (ref 0.44–1.00)
Chloride: 118 mmol/L — ABNORMAL HIGH (ref 98–111)
GFR calc non Af Amer: >60 mL/min (ref >60)
GLUCOSE: 91 mg/dL (ref 70–99)
Potassium: 3.2 mmol/L — ABNORMAL LOW (ref 3.5–5.1)
Sodium: 147 mmol/L — ABNORMAL HIGH (ref 135–145)

## 2018-05-19 LAB — MAGNESIUM
Magnesium: 1.9 mg/dL (ref 1.7–2.4)
Magnesium: 1.8 mg/dL (ref 1.7–2.4)

## 2018-05-19 LAB — PHOSPHORUS
PHOSPHORUS: 2 mg/dL — AB (ref 2.5–4.6)
Phosphorus: 1.2 mg/dL — ABNORMAL LOW (ref 2.5–4.6)

## 2018-05-19 LAB — PHENYTOIN LEVEL, TOTAL: Phenytoin Lvl: 14.4 ug/mL (ref 10.0–20.0)

## 2018-05-19 MED ORDER — VITAL HIGH PROTEIN PO LIQD
1000.0000 mL | ORAL | Status: DC
  Administered 2018-05-19 – 2018-05-20 (×2): 1000 mL

## 2018-05-19 MED ORDER — SODIUM CHLORIDE 0.9 % IV SOLN
200.0000 mg | Freq: Two times a day (BID) | INTRAVENOUS | Status: DC
  Administered 2018-05-19 – 2018-05-26 (×14): 200 mg via INTRAVENOUS
  Filled 2018-05-19 (×15): qty 20

## 2018-05-19 MED ORDER — SODIUM CHLORIDE 0.9 % IV SOLN
200.0000 mg | INTRAVENOUS | Status: AC
  Administered 2018-05-19: 200 mg via INTRAVENOUS
  Filled 2018-05-19: qty 20

## 2018-05-19 MED ORDER — ACETAMINOPHEN 500 MG PO TABS
1000.0000 mg | ORAL_TABLET | Freq: Four times a day (QID) | ORAL | Status: DC | PRN
  Administered 2018-05-19 – 2018-05-21 (×7): 1000 mg via ORAL
  Filled 2018-05-19 (×7): qty 2

## 2018-05-19 MED ORDER — VITAL HIGH PROTEIN PO LIQD
1000.0000 mL | ORAL | Status: DC
  Filled 2018-05-19: qty 1000

## 2018-05-19 MED ORDER — PRO-STAT SUGAR FREE PO LIQD
30.0000 mL | Freq: Three times a day (TID) | ORAL | Status: DC
  Administered 2018-05-19 – 2018-05-24 (×13): 30 mL
  Filled 2018-05-19 (×14): qty 30

## 2018-05-19 MED ORDER — MIDAZOLAM BOLUS VIA INFUSION
10.0000 mg | INTRAVENOUS | Status: AC
  Administered 2018-05-19 (×3): 10 mg via INTRAVENOUS
  Filled 2018-05-19 (×3): qty 10

## 2018-05-19 NOTE — Progress Notes (Signed)
Pt transported on vent to CT and returned to 1K55 without complications.

## 2018-05-19 NOTE — Progress Notes (Signed)
Carly [name]  PNT:[id number] DOB: [birth year] DOA: 05/15/2018 PCP: [name], MD    LOS: 4 days   Reason for Consult / Chief Complaint:  Respiratory failure due to status epilepticus  Consulting MD and date of consult:  [name] - 05/17/2018  HPI/summary of hospital stay:  The patient is a 47 year old woman who developed status epilepticus following elective coiling of a L ICA aneurysm.   Prior [facility] with coiling 6.18 with seizures at that time. Enlarging residual aneurysm on routine follow-up imaging.   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  Started on sedative infusions for burst suppression.   Subjective  Has been burst suppressed since 05/18/18.  Recurrent seizure on EEG when propofol weaned down.    Assessment & Plan:   Status epilepticus following percutaneous neurovascular intervention in patient with a baseline history of seizures related to prior subarachnoid hemorrhage.  Remains encephalopathic due to a combination of residual sedative effect, postictal state and possible new periprocedural stroke. Recurrent seizures following attempt to wean propofol.  Reloaded with midozalam and started on a midazolam infusion.  Repeat imaging pending.  Continue Keppra 1500 mg twice daily, phenytoin 100 mg 3 times daily and oxcarbazepine 600 mg twice daily.  Follow phenytoin levels.   Acute respiratory failure secondary to inability to protect airway.  Continue PRVC ventilation for now as requiring high-dose sedatives and will be unlikely to breathe spontaneously.  Status post prior ICA subarachnoid hemorrhage with recurrent aneurysm.  Successful diversion stenting of aneurysm.  Continue dual antiplatelet therapy.  Hypotension related to sedation.   Continue phenylephrine to keep MAP> 65 to preserve CBF.    Best Practice / Goals of Care /  Disposition.   DVT prophylaxis: Unfractionated heparin GI prophylaxis: Protonix, Diet: Initiate hypocaloric trickle enteral feeds as a full dose feeding may exacerbate status. Mobility: Inappropriate while in sedative infusions. Code Status: Full code Family Communication: Family members updated at bedside this morning.  Disposition / Summary of Today's Plan 05/19/18   Control status epilepticus.  Obtain 24 hours further of burst suppression.  CT scan to rule out any correctable underlying abnormality.  Likely seizures due to periprocedural increased irritability which will settle down over time.  Consultants: date of consult/date signed off (if applicable)/final recs   Neurology.  Procedures:  8/30 elective embolization L ICA aneurysm using Surpass embolization and diversion stent.  Significant Diagnostic Tests: n/a  Micro Data: None  Antimicrobials:  None   Objective    Examination: General: Appears stated age.  Well-kept no respiratory distress.   HENT: Endotracheal and orogastric tube in place Lungs: No ventilator dyssynchrony.  Chest is clear to auscultation Cardiovascular: On clevidipine infusion to maintain normotension.  Clinically euvolemic.  Heart sounds unremarkable.  Extremities warm well perfused. Abdomen: Abdomen soft and nontender Extremities: Trace edema.  IV sites intact. Neuro: Remains comatose.  Pupils dilated but reactive to light.  No seizure like activity.  No limb response to painful stimuli. GU: Foley catheter in place.  Blood pressure 111/60, pulse 62, temperature 99.5 F (37.5 C), resp. rate 18, height 5\' 4"  (1.626 m), weight 112.8 kg, last menstrual period 04/20/2018, SpO2 100 %.    Vent Mode: PRVC FiO2 (%):  [30 %] 30 % Set Rate:  [16 bmp-19 bmp] 16 bmp Vt Set:  [440 mL] 440 mL PEEP:  [5 [place]  Pressure:  [15 cmH20-16 cmH20] 16 cmH20   Intake/Output Summary (Last 24 hours) at 05/19/2018 0921 Last data filed at 05/19/2018  0916 Gross per 24 hour  Intake 4947.07 ml  Output 2735 ml  Net 2212.07 ml   Filed Weights   05/15/18 0844 05/19/18 0500  Weight: 113 kg 112.8 kg     Labs    CBC: Recent Labs  Lab 05/15/18 0841 05/15/18 1809 05/16/18 0535 05/19/18 0511  WBC 6.2 7.1 9.9 6.4  NEUTROABS 3.9  --   --   --   HGB 13.2 13.2 11.4* 10.5*  HCT 41.6 40.2 35.1* 34.0*  MCV 87.0 84.3 84.4 88.5  PLT 178 177 174 102   Basic Metabolic Panel: Recent Labs  Lab 05/15/18 0841 05/17/18 0953 05/18/18 1404 05/18/18 1711 05/19/18 0511  NA 140 143  --   --  147*  K 3.9 4.3  --   --  3.2*  CL 108 113*  --   --  118*  CO2 16* 21*  --   --  25  GLUCOSE 67* 96  --   --  91  BUN 15 5*  --   --  9  CREATININE 1.16* 0.92  --   --  0.98  CALCIUM 9.0 8.9  --   --  7.5*  MG  --   --  2.0 2.0 1.9  PHOS  --   --  2.4* 2.6 2.0*   GFR: Estimated Creatinine Clearance: 85.4 mL/min (by C-G formula based on SCr of 0.98 mg/dL). Recent Labs  Lab 05/15/18 0841 05/15/18 1809 05/16/18 0535 05/19/18 0511  WBC 6.2 7.1 9.9 6.4   Liver Function Tests: Recent Labs  Lab 05/18/18 0920  ALBUMIN 2.9*   No results for input(s): LIPASE, AMYLASE in the last 168 hours. No results for input(s): AMMONIA in the last 168 hours. ABG    Component Value Date/Time   PHART 7.461 (H) 03/16/2017 0328   PCO2ART 59.0 (H) 03/16/2017 0328   PO2ART 75.1 (L) 03/16/2017 0328   HCO3 41.5 (H) 03/16/2017 0328   TCO2 36 03/11/2017 1509   ACIDBASEDEF 10.0 (H) 03/10/2017 0339   O2SAT 95.0 03/16/2017 0328    Coagulation Profile: Recent Labs  Lab 05/15/18 0841  INR 1.01   Cardiac Enzymes: Recent Labs  Lab 05/15/18 1809  TROPONINI <0.03   HbA1C: Hgb A1c MFr Bld  Date/Time Value Ref Range Status  07/23/2013 01:16 PM 5.9 (H) 4.8 - 5.6 % Final    Comment:             Increased risk for diabetes: 5.7 - 6.4          Diabetes: >6.4          Glycemic control for adults with diabetes: <7.0   CBG: No results for input(s): GLUCAP in  the last 168 hours.     CRITICAL CARE Performed by: Kipp Brood   Total critical care time: 40 minutes  Critical care time was exclusive of separately billable procedures and treating other patients.  Critical care was necessary to treat or prevent imminent or life-threatening deterioration.  Critical care was time spent personally by me on the following activities: development of treatment plan with patient and/or surrogate as well as nursing, discussions with consultants, evaluation of patient's response to treatment, examination of patient, obtaining history from patient or surrogate, ordering and performing treatments and interventions, ordering and review of laboratory studies, ordering and review of radiographic studies, pulse oximetry and re-evaluation of  patient's condition.   Kipp Brood, MD [facility] ICU Physician [place]  Pager: [phone] Mobile: [phone] After hours: [phone].

## 2018-05-19 NOTE — Progress Notes (Signed)
OT Cancellation Note  Patient Details Name: ARIANNE KLINGE MRN: [id number] DOB: Mar 26, 1971   Cancelled Treatment:    Reason Eval/Treat Not Completed: Patient not medically ready. Spoke with RN and pt currently lethargic with decr arousal with medications being reduced. OT to continue to check for appropriate time to eval.   Jeri Modena, OTR/L  Acute Rehabilitation Services Pager: [phone] Office: [phone] .  Parke Poisson B 05/19/2018, 11:03 AM

## 2018-05-19 NOTE — Progress Notes (Signed)
Nutrition Follow-up  DOCUMENTATION CODES:   Morbid obesity  INTERVENTION:   Increase Vital High Protein to 45 ml/hr (1080 ml/day) Decrease Prostat to 30 ml TID  Provides: 1380 kcal, 139 grams protein, and 902 ml free water  NUTRITION DIAGNOSIS:   Inadequate oral intake related to inability to eat as evidenced by NPO status. Ongoing.   GOAL:   Provide needs based on ASPEN/SCCM guidelines Progressing.  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  Pt discussed during ICU rounds and with RN.  Pt weaned off propofol but continues to have seizures. Burst suppression resumed but now on versed instead of propofol.   8/30 - s/p surpass embolization LICA aneurysm 0/22 - seizure-like activity 9/1 - 9/3  - propofol for burst suppression 9/3 resumed burst suppression on versed, no propofol   Patient is currently intubated on ventilator support. Pt with OG tube in stomach. MV: 10.1 L/min Temp (24hrs), Avg:98.5 F (36.9 C), Min:97 F (36.1 C), Max:100.9 F (38.3 C) BP: 97/56 MAP: 69  Neosynephrine: stopped off propofol   Medications reviewed and include: MVI with minerals daily, 20 mg Protonix daily, Keppra  Labs reviewed: Na 147 (H), K+ 3.2 (L), triglycerides 271 (H), PO4: 2 (L)  UOP: 2735 ml x 24 hours   I/O's: +780 mL since admission  TF: Vital High Protein @ 10 ml/hr with 60 ml Prostat QID Provides: 1040 kcal, 141 grams protein, and 200 ml free water.   Diet Order:   Diet Order    None      EDUCATION NEEDS:   No education needs have been identified at this time  Skin:  Skin Assessment: Skin Integrity Issues: Incisions: R groin  Last BM:  no documented BM  Height:   Ht Readings from Last 1 Encounters:  05/15/18 5\' 4"   (1.626 m)    Weight:   Wt Readings from Last 1 Encounters:  05/19/18 112.8 kg    Ideal Body Weight:  54.55 kg  BMI:  Body mass index is 42.69 kg/m.  Estimated Nutritional Needs:   Kcal:  3361-2244  Protein:  >/= 136 grams  Fluid:  >/= 1.5 L  Maylon Peppers RD, LDN, CNSC [phone] Pager [phone] After Hours Pager

## 2018-05-19 NOTE — Progress Notes (Signed)
SLP Cancellation Note  Patient Details Name: RAZIYAH VANVLECK MRN: [id number] DOB: [birth year]   Cancelled treatment:       Reason Eval/Treat Not Completed: Medical issues which prohibited therapy; remains intubated.   [name] 05/19/2018, 9:51 AM

## 2018-05-19 NOTE — Progress Notes (Signed)
Wasted 8ml of versed gtt with Almedia Balls, RN

## 2018-05-19 NOTE — Procedures (Signed)
LTM-EEG Report  HISTORY: Continuous video-EEG monitoring performed for 47 year old with focal status epilepticus. ACQUISITION: International 10-20 system for electrode placement; 18 channels with additional eyes linked to ipsilateral ears and EKG. Additional T1-T2 electrodes were used. Continuous video recording obtained.   EEG NUMBER:  MEDICATIONS:  Day 2: see EMR  DAY #1: from 0730 05/18/18 to 0730 05/19/18  BACKGROUND: An overall low voltage, burst-suppression pattern with some reactivity. Periods of suppression lasted 5-15 seconds with bursts of irregular 2-5Hz  activity bilaterally lasting 1-2 seconds. There was waning of the suppression in the early morning with some increased periods of irregular slowing in the background. EPILEPTIFORM/PERIODIC ACTIVITY: none SEIZURES: none EVENTS: none  EKG: no significant arrhythmia  SUMMARY: This was an abnormal continuous video EEG due to a burst-suppression pattern, likely iatrogenic. No epileptiform discharges or seizures were seen.

## 2018-05-19 NOTE — Progress Notes (Signed)
Propofol gtt now at lower rate of 50 mcg/kg/min.   EEG reviewed. Burst suppression has transitioned to a more sustained pattern of generalized diffuse low voltage slowing.   Electronically signed: Dr. Kerney Elbe

## 2018-05-19 NOTE — Progress Notes (Signed)
Subjective: Propofol weaned this AM, She began having intermittent runs of rhythmic alpha concerning for seizures   Exam: Vitals:   05/19/18 1200 05/19/18 1300  BP: (!) 97/56 104/63  Pulse:    Resp: (!) 21 19  Temp: 100.2 F (37.9 C) (!) 100.4 F (38 C)  SpO2: 100%    Gen: In bed, intubated Resp: ventilated.  Abd: soft, nt  Neuro: Eyes open, does not follow commands, eyes midline. Blinks to threat bilaterally.   Pertinent Labs: Bmp - Na 147   Impression: 47 yo  F with recurrent refractory seizures amounting to status epilepticus which was refractory to 4 AEDs. Given the degree of deficit, the decision was made to proceed with burst suppression with propofol. She was suppressed from 7pm 9/1 until the morning of 9/3. Resumption of seizures with weaning of propofol.   Recommendations: 1) Dilantin 100mg  TID, check daily levels.   2) Midazolam with goal of seizure suppression.  3) continue keppra 1500mg  BID 4) continue trileptal 600mg  BID, level 5) start vimpat 200mg  IBD.  6) will follow.   This patient is critically ill and at significant risk of neurological worsening, death and care requires constant monitoring of vital signs, hemodynamics,respiratory and cardiac monitoring, neurological assessment, discussion with family, other specialists and medical decision making of high complexity. I spent 40 minutes of neurocritical care time  in the care of  this patient.  Roland Rack, MD [facility] [phone]  If 7pm- 7am, please page neurology on call as listed in [place]. 05/19/2018  2:12 PM

## 2018-05-19 NOTE — Progress Notes (Signed)
PT Cancellation Note  Patient Details Name: Carly [name] MRN: [id number] DOB: 19-Oct-1970   Cancelled Treatment:    Reason Eval/Treat Not Completed: Medical issues which prohibited therapy Pt remains on vent. Will attempt when stable for OOB.    Reginia Naas 05/19/2018, 8:38 AM  Magda Kiel, [place] 05/19/2018

## 2018-05-19 NOTE — Anesthesia Postprocedure Evaluation (Signed)
Anesthesia Post Note  Patient: DAVIDA FALCONI  Procedure(s) Performed: Arteriogram, Embolization of recurrent aneurysm (N/A )     Patient location during evaluation: PACU Anesthesia Type: General Level of consciousness: awake and alert Pain management: pain level controlled Vital Signs Assessment: post-procedure vital signs reviewed and stable Respiratory status: spontaneous breathing, nonlabored ventilation and respiratory function stable Cardiovascular status: blood pressure returned to baseline and stable Postop Assessment: no apparent nausea or vomiting Anesthetic complications: no    Last Vitals:  Vitals:   05/19/18 0715 05/19/18 0729  BP: (!) 100/53 109/60  Pulse: 62   Resp: 18 17  Temp: 37.4 C 37.4 C  SpO2: 100%     Last Pain:  Vitals:   05/19/18 0400  TempSrc: Rectal  PainSc:                  Lynda Rainwater

## 2018-05-20 ENCOUNTER — Inpatient Hospital Stay (HOSPITAL_COMMUNITY): Payer: 59

## 2018-05-20 LAB — COMPREHENSIVE METABOLIC PANEL
ALBUMIN: 2.4 g/dL — AB (ref 3.5–5.0)
ALK PHOS: 50 U/L (ref 38–126)
ALT: 11 U/L (ref 0–44)
ANION GAP: 6 (ref 5–15)
AST: 15 U/L (ref 15–41)
BILIRUBIN TOTAL: 0.5 mg/dL (ref 0.3–1.2)
BUN: 12 mg/dL (ref 6–20)
CO2: 27 mmol/L (ref 22–32)
Calcium: 7.5 mg/dL — ABNORMAL LOW (ref 8.9–10.3)
Chloride: 115 mmol/L — ABNORMAL HIGH (ref 98–111)
Creatinine, Ser: 0.83 mg/dL (ref 0.44–1.00)
GFR calc Af Amer: >60 mL/min (ref >60)
GLUCOSE: 193 mg/dL — AB (ref 70–99)
POTASSIUM: 2.5 mmol/L — AB (ref 3.5–5.1)
Sodium: 148 mmol/L — ABNORMAL HIGH (ref 135–145)
TOTAL PROTEIN: 4.8 g/dL — AB (ref 6.5–8.1)

## 2018-05-20 LAB — GLUCOSE, CAPILLARY
GLUCOSE-CAPILLARY: 58 mg/dL — AB (ref 70–99)
GLUCOSE-CAPILLARY: 115 mg/dL — AB (ref 70–99)
GLUCOSE-CAPILLARY: 78 mg/dL (ref 70–99)
Glucose-Capillary: 81 mg/dL (ref 70–99)
Glucose-Capillary: 77 mg/dL (ref 70–99)
Glucose-Capillary: 72 mg/dL (ref 70–99)
Glucose-Capillary: 94 mg/dL (ref 70–99)

## 2018-05-20 LAB — CBC WITH DIFFERENTIAL/PLATELET
Abs Immature Granulocytes: 0 10*3/uL (ref 0.0–0.1)
Basophils Absolute: 0 10*3/uL (ref 0.0–0.1)
Basophils Relative: 0 %
EOS PCT: 1 %
Eosinophils Absolute: 0.1 10*3/uL (ref 0.0–0.7)
HEMATOCRIT: 32.6 % — AB (ref 36.0–46.0)
HEMOGLOBIN: 10.4 g/dL — AB (ref 12.0–15.0)
Immature Granulocytes: 0 %
LYMPHS ABS: 1.1 10*3/uL (ref 0.7–4.0)
Lymphocytes Relative: 11 %
MCH: 27.9 pg (ref 26.0–34.0)
MCHC: 31.9 g/dL (ref 30.0–36.0)
MCV: 87.4 fL (ref 78.0–100.0)
MONO ABS: 0.5 10*3/uL (ref 0.1–1.0)
MONOS PCT: 6 %
Neutro Abs: 7.6 10*3/uL (ref 1.7–7.7)
Neutrophils Relative %: 82 %
Platelets: 125 10*3/uL — ABNORMAL LOW (ref 150–400)
RBC: 3.73 MIL/uL — ABNORMAL LOW (ref 3.87–5.11)
RDW: 14.3 % (ref 11.5–15.5)
WBC: 9.4 10*3/uL (ref 4.0–10.5)

## 2018-05-20 LAB — PHENYTOIN LEVEL, TOTAL: Phenytoin Lvl: 14.5 ug/mL (ref 10.0–20.0)

## 2018-05-20 LAB — 10-HYDROXYCARBAZEPINE: TRILIPTAL/MTB(OXCARBAZEPIN): 7 ug/mL — AB (ref 10–35)

## 2018-05-20 MED ORDER — POTASSIUM PHOSPHATES 15 MMOLE/5ML IV SOLN
30.0000 mmol | Freq: Once | INTRAVENOUS | Status: AC
  Administered 2018-05-20: 30 mmol via INTRAVENOUS
  Filled 2018-05-20: qty 10

## 2018-05-20 MED ORDER — SODIUM CHLORIDE 0.9 % IV SOLN
1.0000 mg/h | INTRAVENOUS | Status: DC
  Administered 2018-05-20: 10 mg/h via INTRAVENOUS
  Filled 2018-05-20 (×2): qty 20

## 2018-05-20 MED ORDER — POTASSIUM CHLORIDE 10 MEQ/50ML IV SOLN
10.0000 meq | INTRAVENOUS | Status: AC
  Administered 2018-05-20 (×4): 10 meq via INTRAVENOUS
  Filled 2018-05-20 (×4): qty 50

## 2018-05-20 MED ORDER — POTASSIUM CHLORIDE 20 MEQ PO PACK
40.0000 meq | PACK | Freq: Once | ORAL | Status: AC
  Administered 2018-05-20: 40 meq via ORAL
  Filled 2018-05-20: qty 2

## 2018-05-20 MED ORDER — DEXTROSE 50 % IV SOLN
25.0000 mL | Freq: Once | INTRAVENOUS | Status: AC
  Administered 2018-05-20: 25 mL via INTRAVENOUS
  Filled 2018-05-20: qty 50

## 2018-05-20 MED ORDER — HEPARIN SODIUM (PORCINE) 5000 UNIT/ML IJ SOLN
5000.0000 [IU] | Freq: Three times a day (TID) | INTRAMUSCULAR | Status: DC
  Administered 2018-05-20 – 2018-05-28 (×25): 5000 [IU] via SUBCUTANEOUS
  Filled 2018-05-20 (×26): qty 1

## 2018-05-20 NOTE — Progress Notes (Signed)
OT/ PT Cancellation Note  Patient Details Name: Carly [name] MRN: [id number] DOB: [birth year]   Cancelled Treatment:    Reason Eval/Treat Not Completed: Patient not medically ready Pt remains sedated and discussed with RN . Therapy to hold at this time pending most appropriate time to evaluate   Jeri Modena, OTR/L  Acute Rehabilitation Services Pager: [phone] Office: [phone] .  Parke Poisson B 05/20/2018, 10:41 AM

## 2018-05-20 NOTE — Procedures (Signed)
LTM-EEG Report  HISTORY: Continuous video-EEG monitoring performed for 47 year old with focal status epilepticus. ACQUISITION: International 10-20 system for electrode placement; 18 channels with additional eyes linked to ipsilateral ears and EKG. Additional T1-T2 electrodes were used. Continuous video recording obtained.   EEG NUMBER:  MEDICATIONS:  Day 3: see EMR  DAY #3: from 0730 05/19/18 to 0730 05/20/18  BACKGROUND: An overall medium voltage, mostly continuous record with some spontaneous variability and reactivity. The background consisted of irregular/semirhythmic 1-5Hz  activity bilaterally with frequent superimposed rhythmic theta activity bilaterally. No clear waking or sleep patterns were seen. There were brief periods of attenuation in the afternoon and evening with increased sedation. EPILEPTIFORM/PERIODIC ACTIVITY: see below SEIZURES: In the late morning, there was emergence of focal electrographic seizures arising from the left hemisphere, likely anterior maximal. There was rhythmic 2hz  sharp and slow activity with rhythmic 8-10Hz  activity throughout the left hemisphere with some subtle ictal evolution but no clinical signs. EVENTS: none reported  EKG: no significant arrhythmia  SUMMARY: This was an abnormal continuous video EEG due to left hemispheric electrographic seizures and generalized slowing. Seizures resolved in the early afternoon with increased sedation.

## 2018-05-20 NOTE — Progress Notes (Addendum)
Nutrition Follow-up  DOCUMENTATION CODES:   Morbid obesity  INTERVENTION:    Vital High Protein @ 20 ml/hr (480 ml/day) via OGT  60 ml Prostat TID  Provides: 1080 kcal, 132 grams protein, 72 grams CHO, and 403 ml free water. Meets 87% of calorie needs and 97% of protein needs.   NUTRITION DIAGNOSIS:   Inadequate oral intake related to inability to eat as evidenced by NPO status.  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   Pt discussed during ICU rounds and with RN.  Pt requiring pressor support x1.  SBT today, plan to extubate once awake.  Burst suppression maintained for 48h Seizures recurred after propofol weaned down, resolved with midazolam.  Per RN pt episode of hypoglycemia this am. Resolved with D5.  TF decreased to 20 ml/hr per MD, pt being underfed.   Patient is currently intubated on ventilator support MV: 9.7 L/min Temp (24hrs), Avg:100 F (37.8 C), Min:99.3 F (37.4 C), Max:101.5 F (38.6 C) BP: 117/68 MAP: 84  I/O: +1.9 L since admit UOP: 2925 ml x 24 hrs  Weight noted to be stable from 8/30-9/3. Will continue to monitor.   Medications reviewed and include: MVI with minerals, versed, potassium phosphate in D5 Labs reviewed: Na 148 (H) K 2.5 (L) Phophorus 1.2 (L)- repleting  Diet Order:   Diet Order    None      EDUCATION NEEDS:   No education needs have been identified at this time  Skin:  Skin Assessment: Skin Integrity Issues: Skin Integrity Issues:: Incisions Incisions: R groin  Last BM:  PTA  Height:   Ht Readings from  Last 1 Encounters:  05/15/18 5\' 4"  (1.626 m)    Weight:   Wt Readings from Last 1 Encounters:  05/19/18 112.8 kg    Ideal Body Weight:  54.55 kg  BMI:  Body mass index is 42.69 kg/m.  Estimated Nutritional Needs:   Kcal:  4827-0786  Protein:  >/= 136 grams  Fluid:  >/= 1.5 L   Mariana Single RD, LDN Clinical Nutrition Pager # [phone]

## 2018-05-20 NOTE — Care Management Note (Signed)
Case Management Note  Patient Details  Name: Carly [name] MRN: [id number] Date of Birth: 03/18/1971  Subjective/Objective:  The patient is a 47 year old woman who developed status epilepticus following elective coiling of a L ICA aneurysm.  PTA, pt resided at home with her spouse, Carly [name].  The patient is known to [place] from a previous admission.                  Action/Plan: Pt currently remains sedated and on ventilator.  Will follow for discharge planning as pt progresses.  Expected Discharge Date:                  Expected Discharge Plan:     In-House Referral:     Discharge planning Services  CM Consult  Post Acute Care Choice:    Choice offered to:     DME Arranged:    DME Agency:     HH Arranged:    HH Agency:     Status of Service:  In process, will continue to follow  If discussed at Long Length of Stay Meetings, dates discussed:    Additional Comments:  Ella Bodo, RN 05/20/2018, 4:57 PM

## 2018-05-20 NOTE — Progress Notes (Signed)
PT Cancellation Note  Patient Details Name: EMERSEN MASCARI MRN: [id number] DOB: 25-Feb-1971   Cancelled Treatment:    Reason Eval/Treat Not Completed: Medical issues which prohibited therapy.  Still burst suppressed and not responsive enough for therapy. 05/20/2018  Donnella Sham, [place] [phone]  (pager)    Tessie Fass Kazzandra Desaulniers 05/20/2018, 11:57 AM

## 2018-05-20 NOTE — Progress Notes (Signed)
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   Prior [facility] with coiling 6.18 with seizures at that time. Enlarging residual aneurysm on routine follow-up imaging.   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  Started on sedative infusions for burst suppression. Burst suppression maintained for 48h, but seizures recurred after propofol weaned down. Clinical seizure control once again achieved with initiation of midazolam 05/19/18.  Subjective   Continues to have self-terminating bursts of seizure activity with an ill-defined left sided focus.  No clinical seizure activity.    Assessment & Plan:   Status epilepticus following percutaneous neurovascular intervention in patient with a baseline history of seizures related to prior subarachnoid hemorrhage.  Clinical seizure activity controlled on midazolam. Slow wean. No correctable abnormality on CT.   Continue Keppra 1500 mg twice daily, phenytoin 100 mg 3 times daily and oxcarbazepine 600 mg twice daily.  Follow phenytoin levels.  Acute respiratory failure secondary to inability to protect airway.  Continue PRVC ventilation for now as requiring high-dose sedatives.  Status post prior ICA subarachnoid hemorrhage with recurrent aneurysm.  Successful diversion stenting of aneurysm.  Continue dual antiplatelet therapy.  Hypotension related to sedation.   Continue phenylephrine to keep MAP> 65 to preserve CBF.  Hypokalemia.  Borderline hypomagnesemia. Will replete  both.    Best Practice / Goals of Care / Disposition.   DVT prophylaxis: Unfractionated heparin GI prophylaxis: Protonix, Diet: Initiate hypocaloric trickle enteral feeds as a full dose feeding may exacerbate status. Mobility: Inappropriate while in sedative infusions. Code Status: Full code Family Communication: Family members updated at bedside this morning.  Disposition / Summary of Today's Plan 05/20/18   Slow wean of midazolam.  SBT and extubate once awake.   Consultants: date of consult/date signed off (if applicable)/final recs   Neurology.  Procedures:  8/30 elective embolization L ICA aneurysm using Surpass embolization and diversion stent.  Significant Diagnostic Tests: n/a  Micro Data: None  Antimicrobials:  None   Objective    Examination: General: Appears stated age.  Well-kept no respiratory distress.   HENT: Endotracheal and orogastric tube in place Lungs: No ventilator dyssynchrony.  Chest is clear to auscultation Cardiovascular: On phenylephrine via PICC.  Clinically euvolemic.  Heart sounds unremarkable.  Extremities warm well perfused. Abdomen: Abdomen soft and nontender Extremities: Trace edema.  IV sites intact. Neuro: (4 Score: 8) Remains comatose.  Pupils dilated but reactive to light.  No doll's, corneals present. Breathes spontaneously. No seizure like activity.  No limb response to painful stimuli.  GU: Foley catheter in place.  Blood pressure (!) 101/58, pulse 73, temperature 99.7 F (37.6 C), temperature source Rectal, resp. rate 18, height 5\' 4"  (1.626 m), weight 112.8 kg, last menstrual period 04/20/2018, SpO2 100 %.    Vent Mode: PRVC FiO2 (%):  [30 %] 30 % Set Rate:  [16 bmp] 16 bmp Vt Set:  [440 mL] 440 mL PEEP:  [5 cmH20] 5 cmH20 Plateau Pressure:  [16 cmH20-18 cmH20] 18 cmH20   Intake/Output  Summary (Last 24 hours) at 05/20/2018 0903 Last data filed at 05/20/2018 0800 Gross per 24 hour  Intake 3513.08 ml  Output 2925 ml  Net  588.08 ml   Filed Weights   05/15/18 0844 05/19/18 0500  Weight: 113 kg 112.8 kg     Labs    CBC: Recent Labs  Lab 05/15/18 0841 05/15/18 1809 05/16/18 0535 05/19/18 0511 05/20/18 0414  WBC 6.2 7.1 9.9 6.4 9.4  NEUTROABS 3.9  --   --   --  7.6  HGB 13.2 13.2 11.4* 10.5* 10.4*  HCT 41.6 40.2 35.1* 34.0* 32.6*  MCV 87.0 84.3 84.4 88.5 87.4  PLT 178 177 174 157 366*   Basic Metabolic Panel: Recent Labs  Lab 05/15/18 0841 05/17/18 0953 05/18/18 1404 05/18/18 1711 05/19/18 0511 05/19/18 1454 05/20/18 0414  NA 140 143  --   --  147*  --  148*  K 3.9 4.3  --   --  3.2*  --  2.5*  CL 108 113*  --   --  118*  --  115*  CO2 16* 21*  --   --  25  --  27  GLUCOSE 67* 96  --   --  91  --  193*  BUN 15 5*  --   --  9  --  12  CREATININE 1.16* 0.92  --   --  0.98  --  0.83  CALCIUM 9.0 8.9  --   --  7.5*  --  7.5*  MG  --   --  2.0 2.0 1.9 1.8  --   PHOS  --   --  2.4* 2.6 2.0* 1.2*  --    GFR: Estimated Creatinine Clearance: 100.8 mL/min (by C-G formula based on SCr of 0.83 mg/dL). Recent Labs  Lab 05/15/18 1809 05/16/18 0535 05/19/18 0511 05/20/18 0414  WBC 7.1 9.9 6.4 9.4   Liver Function Tests: Recent Labs  Lab 05/18/18 0920 05/20/18 0414  AST  --  15  ALT  --  11  ALKPHOS  --  50  BILITOT  --  0.5  PROT  --  4.8*  ALBUMIN 2.9* 2.4*   No results for input(s): LIPASE, AMYLASE in the last 168 hours. No results for input(s): AMMONIA in the last 168 hours. ABG    Component Value Date/Time   PHART 7.421 05/17/2018 2048   PCO2ART 43.0 05/17/2018 2048   PO2ART 544.0 (H) 05/17/2018 2048   HCO3 27.9 05/17/2018 2048   TCO2 29 05/17/2018 2048   ACIDBASEDEF 10.0 (H) 03/10/2017 0339   O2SAT 100.0 05/17/2018 2048    Coagulation Profile: Recent Labs  Lab 05/15/18 0841  INR 1.01   Cardiac Enzymes: Recent Labs  Lab 05/15/18 1809  TROPONINI <0.03   HbA1C: Hgb A1c MFr Bld  Date/Time Value Ref Range Status  07/23/2013 01:16 PM 5.9 (H) 4.8 - 5.6 % Final     Comment:             Increased risk for diabetes: 5.7 - 6.4          Diabetes: >6.4          Glycemic control for adults with diabetes: <7.0   CBG: Recent Labs  Lab 05/19/18 1954 05/19/18 2316 05/20/18 0338 05/20/18 0433 05/20/18 0758  GLUCAP 75 102* 58* 81 77       CRITICAL CARE Performed by: Kipp Brood   Total critical care time: 40 minutes  Critical care time was exclusive of separately  billable procedures and treating other patients.  Critical care was necessary to treat or prevent imminent or life-threatening deterioration.  Critical care was time spent personally by me on the following activities: development of treatment plan with patient and/or surrogate as well as nursing, discussions with consultants, evaluation of patient's response to treatment, examination of patient, obtaining history from patient or surrogate, ordering and performing treatments and interventions, ordering and review of laboratory studies, ordering and review of radiographic studies, pulse oximetry and re-evaluation of patient's condition.   Kipp Brood, MD [facility] ICU Physician [place]  Pager: [phone] Mobile: [phone] After hours: [phone].

## 2018-05-20 NOTE — Progress Notes (Signed)
Subjective: Midazolam on overnight  Exam: Vitals:   05/20/18 0745 05/20/18 0800  BP: (!) 93/49 (!) 101/58  Pulse: 78   Resp: 20 18  Temp: 99.9 F (37.7 C) 99.7 F (37.6 C)  SpO2: 97% 100%   Gen: In bed, intubated Resp: ventilated.  Abd: soft, nt  Neuro: Eyes open, does not follow commands, eyes midline. Blinks to threat bilaterally.   Pertinent Labs: PHT 14.5 Cr 0.8  Impression: 47 yo  F with recurrent refractory seizures amounting to status epilepticus which was refractory to 4 AEDs. Given the degree of deficit, the decision was made to proceed with burst suppression with propofol. She was suppressed from 7pm 9/1 until the morning of 9/3. Resumption of seizures with weaning of propofol and resolved with midazolam.   Recommendations: 1) Dilantin 100mg  TID, check daily levels.   2) wean midazolam as tolerated 3) continue keppra 1500mg  BID 4) continue trileptal 600mg  BID, level 5) vimpat 200mg  IBD.  6) will follow.   This patient is critically ill and at significant risk of neurological worsening, death and care requires constant monitoring of vital signs, hemodynamics,respiratory and cardiac monitoring, neurological assessment, discussion with family, other specialists and medical decision making of high complexity. I spent 40 minutes of neurocritical care time  in the care of  this patient.  Roland Rack, MD [facility] [phone]  If 7pm- 7am, please page neurology on call as listed in [place]. 05/20/2018  10:19 AM

## 2018-05-20 NOTE — Progress Notes (Signed)
  NEUROSURGERY PROGRESS NOTE   Pt seen and examined. After propofol lightened, appeared to revert back into status. Now on versed, added vimpat.  EXAM: Temp:  [99.3 F (37.4 C)-101.5 F (38.6 C)] 99.7 F (37.6 C) (09/04 0800) Pulse Rate:  [64-84] 73 (09/04 0500) Resp:  [16-40] 18 (09/04 0800) BP: (84-135)/(47-86) 101/58 (09/04 0800) SpO2:  [100 %] 100 % (09/04 0800) FiO2 (%):  [30 %] 30 % (09/04 0800) Intake/Output      09/03 0701 - 09/04 0700 09/04 0701 - 09/05 0700   I.V. (mL/kg) 2480.8 (22) 19.4 (0.2)   NG/GT 686.1 280   IV Piggyback 343.6 46.4   Total Intake(mL/kg) 3510.5 (31.1) 345.8 (3.1)   Urine (mL/kg/hr) 2925 (1.1)    Total Output 2925    Net +585.5 +345.8         On versed No eye opening No motor responses  LABS: Lab Results  Component Value Date   CREATININE 0.83 05/20/2018   BUN 12 05/20/2018   NA 148 (H) 05/20/2018   K 2.5 (LL) 05/20/2018   CL 115 (H) 05/20/2018   CO2 27 05/20/2018   Lab Results  Component Value Date   WBC 9.4 05/20/2018   HGB 10.4 (L) 05/20/2018   HCT 32.6 (L) 05/20/2018   MCV 87.4 05/20/2018   PLT 125 (L) 05/20/2018    IMAGING: CTH yesterday reviewed, unremarkable.   IMPRESSION: - 47 y.o. female s/p flow diversion of recurrent LICA aneurysm with postop status epilepticus, appears refractory to standard medical treatment.  PLAN: - Cont to wean versed per neurology while monitoring EEG - Once off iv sedatives and out of status, can attempt to wean and extubate per PCCM

## 2018-05-20 NOTE — Progress Notes (Signed)
LTM EEG checked, no skin breakdown noted on frontal leads. Paste added to Fp1 and Fp2

## 2018-05-21 LAB — CBC WITH DIFFERENTIAL/PLATELET
ABS IMMATURE GRANULOCYTES: 0.1 10*3/uL (ref 0.0–0.1)
BASOS PCT: 0 %
Basophils Absolute: 0 10*3/uL (ref 0.0–0.1)
Eosinophils Absolute: 0.1 10*3/uL (ref 0.0–0.7)
Eosinophils Relative: 1 %
HEMATOCRIT: 34 % — AB (ref 36.0–46.0)
HEMOGLOBIN: 10.7 g/dL — AB (ref 12.0–15.0)
IMMATURE GRANULOCYTES: 1 %
LYMPHS ABS: 1.2 10*3/uL (ref 0.7–4.0)
LYMPHS PCT: 12 %
MCH: 27.2 pg (ref 26.0–34.0)
MCHC: 31.5 g/dL (ref 30.0–36.0)
MCV: 86.5 fL (ref 78.0–100.0)
MONO ABS: 0.7 10*3/uL (ref 0.1–1.0)
MONOS PCT: 7 %
NEUTROS ABS: 8.3 10*3/uL — AB (ref 1.7–7.7)
NEUTROS PCT: 79 %
PLATELETS: 133 10*3/uL — AB (ref 150–400)
RBC: 3.93 MIL/uL (ref 3.87–5.11)
RDW: 14.5 % (ref 11.5–15.5)
WBC: 10.4 10*3/uL (ref 4.0–10.5)

## 2018-05-21 LAB — GLUCOSE, CAPILLARY
GLUCOSE-CAPILLARY: 105 mg/dL — AB (ref 70–99)
GLUCOSE-CAPILLARY: 115 mg/dL — AB (ref 70–99)
GLUCOSE-CAPILLARY: 216 mg/dL — AB (ref 70–99)
Glucose-Capillary: 107 mg/dL — ABNORMAL HIGH (ref 70–99)
Glucose-Capillary: 122 mg/dL — ABNORMAL HIGH (ref 70–99)
Glucose-Capillary: 136 mg/dL — ABNORMAL HIGH (ref 70–99)

## 2018-05-21 LAB — COMPREHENSIVE METABOLIC PANEL
ALK PHOS: 64 U/L (ref 38–126)
ALT: 15 U/L (ref 0–44)
AST: 21 U/L (ref 15–41)
Albumin: 2.5 g/dL — ABNORMAL LOW (ref 3.5–5.0)
Anion gap: 11 (ref 5–15)
BUN: 9 mg/dL (ref 6–20)
CHLORIDE: 107 mmol/L (ref 98–111)
CO2: 27 mmol/L (ref 22–32)
CREATININE: 0.74 mg/dL (ref 0.44–1.00)
Calcium: 7.7 mg/dL — ABNORMAL LOW (ref 8.9–10.3)
Glucose, Bld: 128 mg/dL — ABNORMAL HIGH (ref 70–99)
Potassium: 3.3 mmol/L — ABNORMAL LOW (ref 3.5–5.1)
Sodium: 145 mmol/L (ref 135–145)
TOTAL PROTEIN: 5.2 g/dL — AB (ref 6.5–8.1)
Total Bilirubin: 0.6 mg/dL (ref 0.3–1.2)

## 2018-05-21 LAB — PHOSPHORUS: Phosphorus: 2.2 mg/dL — ABNORMAL LOW (ref 2.5–4.6)

## 2018-05-21 LAB — PHENYTOIN LEVEL, TOTAL: Phenytoin Lvl: 17.4 ug/mL (ref 10.0–20.0)

## 2018-05-21 MED ORDER — IBUPROFEN 100 MG/5ML PO SUSP
400.0000 mg | Freq: Four times a day (QID) | ORAL | Status: DC | PRN
  Administered 2018-05-21 – 2018-05-23 (×3): 400 mg
  Filled 2018-05-21 (×4): qty 20

## 2018-05-21 MED ORDER — VITAL HIGH PROTEIN PO LIQD
1000.0000 mL | ORAL | Status: DC
  Administered 2018-05-22 – 2018-05-24 (×2): 1000 mL

## 2018-05-21 MED ORDER — OXCARBAZEPINE 300 MG PO TABS
900.0000 mg | ORAL_TABLET | Freq: Two times a day (BID) | ORAL | Status: DC
  Administered 2018-05-21 – 2018-05-23 (×4): 900 mg via ORAL
  Filled 2018-05-21 (×4): qty 3

## 2018-05-21 MED ORDER — OXCARBAZEPINE 300 MG PO TABS: 950.0000 mg | ORAL_TABLET | Freq: Two times a day (BID) | ORAL | Status: DC

## 2018-05-21 MED ORDER — IBUPROFEN 100 MG/5ML PO SUSP
200.0000 mg | Freq: Four times a day (QID) | ORAL | Status: DC | PRN
  Administered 2018-05-21: 200 mg via ORAL
  Filled 2018-05-21 (×2): qty 10

## 2018-05-21 MED ORDER — IBUPROFEN 100 MG/5ML PO SUSP
400.0000 mg | Freq: Four times a day (QID) | ORAL | Status: AC | PRN
  Administered 2018-05-21: 400 mg
  Filled 2018-05-21: qty 20

## 2018-05-21 MED ORDER — K PHOS MONO-SOD PHOS DI & MONO 155-852-130 MG PO TABS
250.0000 mg | ORAL_TABLET | Freq: Two times a day (BID) | ORAL | Status: AC
  Administered 2018-05-21 (×2): 250 mg via ORAL
  Filled 2018-05-21 (×3): qty 1

## 2018-05-21 MED ORDER — POTASSIUM CHLORIDE 20 MEQ PO PACK
40.0000 meq | PACK | Freq: Two times a day (BID) | ORAL | Status: AC
  Administered 2018-05-21 – 2018-05-22 (×3): 40 meq via ORAL
  Filled 2018-05-21 (×3): qty 2

## 2018-05-21 MED ORDER — VITAL HIGH PROTEIN PO LIQD
1000.0000 mL | ORAL | Status: DC
  Administered 2018-05-21: 1000 mL

## 2018-05-21 MED ORDER — ACETAMINOPHEN 160 MG/5ML PO SOLN
650.0000 mg | Freq: Four times a day (QID) | ORAL | Status: DC | PRN
  Administered 2018-05-21 – 2018-05-23 (×6): 650 mg
  Filled 2018-05-21 (×7): qty 20.3

## 2018-05-21 MED ORDER — POTASSIUM CHLORIDE 20 MEQ PO PACK: 40.0000 meq | PACK | Freq: Two times a day (BID) | ORAL | Status: DC

## 2018-05-21 MED ORDER — PHENYTOIN SODIUM 50 MG/ML IJ SOLN
75.0000 mg | Freq: Three times a day (TID) | INTRAMUSCULAR | Status: DC
  Administered 2018-05-21 – 2018-05-22 (×3): 75 mg via INTRAVENOUS
  Filled 2018-05-21 (×3): qty 2

## 2018-05-21 NOTE — Progress Notes (Signed)
PT Cancellation Note  Patient Details Name: Carly [name] MRN: [id number] DOB: May 03, 1971   Cancelled Treatment:    Reason Eval/Treat Not Completed: Medical issues which prohibited therapy.  Pt's sedation decreasing, but pt not waking up.  Will try later. 05/21/2018  Donnella Sham, [place] [phone]  (pager)    Carly [name] 05/21/2018, 10:16 AM

## 2018-05-21 NOTE — Procedures (Signed)
LTM-EEG Report  HISTORY: Continuous video-EEG monitoring performed for20year old with focal status epilepticus. ACQUISITION: International 10-20 system for electrode placement; 18 channels with additional eyes linked to ipsilateral ears and EKG. Additional T1-T2 electrodes were used. Continuous video recording obtained.   EEG NUMBER:  MEDICATIONS: Day 4: see EMR  DAY #4: [id number] 9/4/19to 0730 05/21/18  BACKGROUND: An overallmedium voltage, continuous record with some spontaneous variability and reactivity. The background consisted of irregular/semirhythmic 1-5Hz  activity bilaterally with frequent superimposed rhythmic theta activity bilaterally. No clear waking or sleep patterns were seen.  EPILEPTIFORM/PERIODIC ACTIVITY:none SEIZURES:none EVENTS:none reported  EKG: no significant arrhythmia  SUMMARY: This wasan abnormal continuous video EEG due to generalized background slowing, indicative of a diffuse cerebral disturbance. No further seizures were seen.

## 2018-05-21 NOTE — Progress Notes (Signed)
  NEUROSURGERY PROGRESS NOTE   Pt seen and examined. No issues overnight. Versed off this am.  EXAM: Temp:  [99.3 F (37.4 C)-101.7 F (38.7 C)] 100 F (37.8 C) (09/05 1200) Pulse Rate:  [82-105] 90 (09/05 1200) Resp:  [16-24] 18 (09/05 1200) BP: (85-158)/(39-96) 136/79 (09/05 1200) SpO2:  [90 %-100 %] 100 % (09/05 1200) FiO2 (%):  [30 %] 30 % (09/05 1100) Weight:  [97.3 kg] 97.3 kg (09/05 0500) Intake/Output      09/04 0701 - 09/05 0700 09/05 0701 - 09/06 0700   I.V. (mL/kg) 1528.1 (15.7) 150.8 (1.5)   NG/GT 810    IV Piggyback 874.9    Total Intake(mL/kg) 3213 (33) 150.8 (1.5)   Urine (mL/kg/hr) 2455 (1.1) 550 (1)   Total Output 2455 550   Net +758 -399.2         Eyes open spontaneously Blinks to threat, ?tracking Breathing spontaneously Not following commands, moves extremities spontaneously  LABS: Lab Results  Component Value Date   CREATININE 0.74 05/21/2018   BUN 9 05/21/2018   NA 145 05/21/2018   K 3.3 (L) 05/21/2018   CL 107 05/21/2018   CO2 27 05/21/2018   Lab Results  Component Value Date   WBC 10.4 05/21/2018   HGB 10.7 (L) 05/21/2018   HCT 34.0 (L) 05/21/2018   MCV 86.5 05/21/2018   PLT 133 (L) 05/21/2018    IMPRESSION: - 47 y.o. female s/p embolization of recurrent LICA aneurysm with postop refractory status which so far appears controlled after period of burst suppression  PLAN: - Cont to monitor neurologic exam and EEG - If she continues to have improved LOC, suspect she could progress to extubate assuming no further SZ.

## 2018-05-21 NOTE — Progress Notes (Signed)
eLink Physician-Brief Progress Note Patient Name: ABRIELLA FILKINS DOB: 1971/06/21 MRN: [id number]   Date of Service  05/21/2018  HPI/Events of Note  Fever despite Tylenol. Patient admitted with status epilepticus and currently onmultiple AED's, as well as antiplatelet therapy.  eICU Interventions  Limited trial of 2 prn doses of Ibuprofen for the fever given considerable risk of hemorrhage from multiple antiplatelet agents vs the potential of the fever to further lower the seizure threshold.        Kerry Kass Ogan 05/21/2018, 1:59 AM

## 2018-05-21 NOTE — Progress Notes (Signed)
OT Cancellation Note  Patient Details Name: Carly [name] MRN: [id number] DOB: 1971-01-18   Cancelled Treatment:    Reason Eval/Treat Not Completed: Patient not medically ready. Pt with decreased sedation, but not following commands and very lethargic per RN. Will continue to follow for medical appropriateness, and check back today as schedule allows.  Merri Ray Josselyn Harkins 05/21/2018, 9:27 AM  Hulda Humphrey OTR/L Acute Rehabilitation Services Pager: [phone] Office: [phone]

## 2018-05-21 NOTE — Progress Notes (Signed)
Carly [name]  KYH:[id number] DOB: [birth year] DOA: 05/15/2018 PCP: [name], MD    LOS: 6 days   Reason for Consult / Chief Complaint:  Respiratory failure due to status epilepticus  Consulting MD and date of consult:  [name] - 05/17/2018  HPI/summary of hospital stay:  The patient is a 47 year old woman who developed status epilepticus following elective coiling of a L ICA aneurysm.   Prior [facility] with coiling 6.18 with seizures at that time. Enlarging residual aneurysm on routine follow-up imaging.   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  Started on sedative infusions for burst suppression. Burst suppression maintained for 48h, but seizures recurred after propofol weaned down. Clinical seizure control once again achieved with initiation of midazolam 05/19/18.  Subjective   No further clinical or EEG seizures. Midazolam stopped 05/21/18.  Assessment & Plan:   Status epilepticus following percutaneous neurovascular intervention in patient with a baseline history of seizures related to prior subarachnoid hemorrhage.  Clinical seizure activity controlled on midazolam, now off. No correctable abnormality on CT.  Patient waking up.   Continue Keppra 1500 mg twice daily, phenytoin 100 mg 3 times daily and oxcarbazepine 600 mg twice daily, Vimpat 200mg  q12h..  Follow phenytoin levels.  On Keppra alone at home.  Acute respiratory failure secondary to inability to protect airway.  Now tolerating PSV ventilation. Extubate once mental status allows.  Status post prior ICA subarachnoid hemorrhage with recurrent aneurysm.  Successful diversion stenting of aneurysm.  Continue dual antiplatelet therapy.  Hypokalemia.  Borderline hypomagnesemia. Will continue to replete both.  Fever   Currently controlled with acetaminophen and ibuprofen. No leukocytosis but  copious secretions   Culture sputum - hold on antibiotics for now.  Best Practice / Goals of Care / Disposition.   DVT prophylaxis: Unfractionated heparin GI prophylaxis: Protonix, Diet: increase feeds to meet caloric goal. Mobility: initiate mobility once patient more awake. Code Status: Full code Family Communication: Family members updated at bedside this morning.  Disposition / Summary of Today's Plan 05/21/18   Extubate once awake.    Consultants: date of consult/date signed off (if applicable)/final recs   Neurology.  Procedures:  8/30 elective embolization L ICA aneurysm using Surpass embolization and diversion stent.  Significant Diagnostic Tests: n/a  Micro Data: None  Antimicrobials:  None   Objective    Examination: General: Appears stated age.  Well-kept no respiratory distress.   HENT: Endotracheal and orogastric tube in place Lungs: No ventilator dyssynchrony.  Chest is clear to auscultation. Tolerating PSV Cardiovascular: Off phenylephrine via PICC.  Clinically euvolemic.  Heart sounds unremarkable.  Extremities warm well perfused. Abdomen: Abdomen soft and nontender Extremities: Trace edema.  IV sites intact. Neuro: (4 Score: 8) Remains comatose.  Pupils dilated but reactive to light.  No doll's, corneals present. Breathes spontaneously. No seizure like activity.  No limb response to painful stimuli.  GU: Foley catheter in place.  Blood pressure 125/74, pulse 83, temperature 99.3 F (37.4 C), resp. rate 17, height 5\' 4"  (1.626 m), weight 97.3 kg, SpO2 100 %.    Vent Mode: PRVC FiO2 (%):  [30 %] 30 % Set Rate:  [16 bmp] 16 bmp Vt Set:  [440 mL] 440 mL PEEP:  [5 cmH20] 5 cmH20 Plateau Pressure:  [16 cmH20-17 cmH20] 17 cmH20   Intake/Output Summary (Last 24 hours) at 05/21/2018 0905 Last  data filed at 05/21/2018 0900 Gross per 24 hour  Intake 2991.77 ml  Output 2455 ml  Net 536.77 ml   Filed Weights   05/15/18 0844 05/19/18 0500 05/21/18 0500    Weight: 113 kg 112.8 kg 97.3 kg     Labs    CBC: Recent Labs  Lab 05/15/18 0841 05/15/18 1809 05/16/18 0535 05/19/18 0511 05/20/18 0414 05/21/18 0448  WBC 6.2 7.1 9.9 6.4 9.4 10.4  NEUTROABS 3.9  --   --   --  7.6 8.3*  HGB 13.2 13.2 11.4* 10.5* 10.4* 10.7*  HCT 41.6 40.2 35.1* 34.0* 32.6* 34.0*  MCV 87.0 84.3 84.4 88.5 87.4 86.5  PLT 178 177 174 157 125* 749*   Basic Metabolic Panel: Recent Labs  Lab 05/15/18 0841 05/17/18 0953 05/18/18 1404 05/18/18 1711 05/19/18 0511 05/19/18 1454 05/20/18 0414 05/21/18 0448  NA 140 143  --   --  147*  --  148* 145  K 3.9 4.3  --   --  3.2*  --  2.5* 3.3*  CL 108 113*  --   --  118*  --  115* 107  CO2 16* 21*  --   --  25  --  27 27  GLUCOSE 67* 96  --   --  91  --  193* 128*  BUN 15 5*  --   --  9  --  12 9  CREATININE 1.16* 0.92  --   --  0.98  --  0.83 0.74  CALCIUM 9.0 8.9  --   --  7.5*  --  7.5* 7.7*  MG  --   --  2.0 2.0 1.9 1.8  --   --   PHOS  --   --  2.4* 2.6 2.0* 1.2*  --  2.2*   GFR: Estimated Creatinine Clearance: 96.3 mL/min (by C-G formula based on SCr of 0.74 mg/dL). Recent Labs  Lab 05/16/18 0535 05/19/18 0511 05/20/18 0414 05/21/18 0448  WBC 9.9 6.4 9.4 10.4   Liver Function Tests: Recent Labs  Lab 05/18/18 0920 05/20/18 0414 05/21/18 0448  AST  --  15 21  ALT  --  11 15  ALKPHOS  --  50 64  BILITOT  --  0.5 0.6  PROT  --  4.8* 5.2*  ALBUMIN 2.9* 2.4* 2.5*   No results for input(s): LIPASE, AMYLASE in the last 168 hours. No results for input(s): AMMONIA in the last 168 hours. ABG    Component Value Date/Time   PHART 7.421 05/17/2018 2048   PCO2ART 43.0 05/17/2018 2048   PO2ART 544.0 (H) 05/17/2018 2048   HCO3 27.9 05/17/2018 2048   TCO2 29 05/17/2018 2048   ACIDBASEDEF 10.0 (H) 03/10/2017 0339   O2SAT 100.0 05/17/2018 2048    Coagulation Profile: Recent Labs  Lab 05/15/18 0841  INR 1.01   Cardiac Enzymes: Recent Labs  Lab 05/15/18 1809  TROPONINI <0.03   HbA1C: Hgb  A1c MFr Bld  Date/Time Value Ref Range Status  07/23/2013 01:16 PM 5.9 (H) 4.8 - 5.6 % Final    Comment:             Increased risk for diabetes: 5.7 - 6.4          Diabetes: >6.4          Glycemic control for adults with diabetes: <7.0   CBG: Recent Labs  Lab 05/20/18 1606 05/20/18 1937 05/20/18 2327 05/21/18 0336 05/21/18 0749  GLUCAP 115* 78 94 122* 105*  Results for Carly [name], Carly [name] (MRN [id number]) as of 05/21/2018 10:25  Ref. Range 05/17/2018 09:53 05/19/2018 14:54 05/20/2018 04:14 05/21/2018 04:48  Phenytoin Lvl Latest Ref Range: 10.0 - 20.0 ug/mL 18.2 14.4 14.5 17.4     CRITICAL CARE Performed by: Kipp Brood  Total critical care time: 40 minutes  Critical care time was exclusive of separately billable procedures and treating other patients.  Critical care was necessary to treat or prevent imminent or life-threatening deterioration.  Critical care was time spent personally by me on the following activities: development of treatment plan with patient and/or surrogate as well as nursing, discussions with consultants, evaluation of patient's response to treatment, examination of patient, obtaining history from patient or surrogate, ordering and performing treatments and interventions, ordering and review of laboratory studies, ordering and review of radiographic studies, pulse oximetry and re-evaluation of patient's condition.   Kipp Brood, MD [facility] ICU Physician [place]  Pager: [phone] Mobile: [phone] After hours: [phone].

## 2018-05-21 NOTE — Progress Notes (Addendum)
Nutrition Follow-up  DOCUMENTATION CODES:   Morbid obesity  INTERVENTION:   Increase Vital High Protein to 45 ml/hr (1080 ml/day) Decrease Prostat to 30 ml TID  Provides: 1380 kcal, 139 grams protein, and 902 ml free water  NUTRITION DIAGNOSIS:   Inadequate oral intake related to inability to eat as evidenced by NPO status.  Ongoing  GOAL:   Provide needs based on ASPEN/SCCM guidelines  Progressing.   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   Pt discussed during ICU rounds and with RN.  Burst suppression now d/c'ed, plan to extubate once awake.  Burst suppression maintained for 48h  Patient is currently intubated on ventilator support MV: 9.3 L/min Temp (24hrs), Avg:100.4 F (38 C), Min:99.3 F (37.4 C), Max:101.7 F (38.7 C) BP: 147/92 MAP: 110  I/O: +1.4 L since admit UOP: 2455 ml x 24 hrs  Medications reviewed and include: MVI with minerals, Kphos 250 mg until tomorrow  Labs reviewed: K 3.3 (L) Phophorus 2.2 (L)- repleting  Diet Order:   Diet Order    None      EDUCATION NEEDS:   No education needs have been identified at this time  Skin:  Skin Assessment: Skin Integrity Issues: Skin Integrity Issues:: Incisions Incisions: R groin  Last BM:  PTA  Height:   Ht Readings from Last 1 Encounters:  05/15/18 5\' 4"  (1.626 m)    Weight:   Wt Readings from Last 1 Encounters:  05/21/18 97.3 kg    Ideal Body Weight:  54.55 kg  BMI:  Body mass index is 36.82 kg/m.  Estimated Nutritional Needs:   Kcal:  1914-7829  Protein:  >/= 136 grams  Fluid:  >/= 1.5 L  Maylon Peppers RD, LDN, CNSC [phone] Pager [phone] After Hours Pager

## 2018-05-21 NOTE — Progress Notes (Signed)
Subjective: No seizures, was on a low-dose of midazolam overnight stopped this morning. Exam: Vitals:   05/21/18 1045 05/21/18 1100  BP:  138/68  Pulse: 89 88  Resp: 19 19  Temp: 99.9 F (37.7 C)   SpO2: 100% 100%   Gen: In bed, intubated Resp: ventilated.  Abd: soft, nt  Neuro: She opens her eyes, attempts to fixate but does not look much to the right or left. She does have nystagmus. She does blink to threat. She does not follow commands. 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 Given the degree of deficit, the decision was made to proceed with burst suppression with propofol. She was suppressed from 7pm 9/1 until the morning of 9/3. Resumption of seizures with weaning of propofol and resolved with midazolam.   Recommendations: 1) Dilantin 100mg  TID, check daily levels.   2) continue EEG for 24 hours from cessation of midazolam.  3) continue keppra 1500mg  BID 4) increase trileptal to 950 BID(liekly being metabolized due to PHT) 5) vimpat 200mg  IBD.  6) will follow.   This patient is critically ill and at significant risk of neurological worsening, death and care requires constant monitoring of vital signs, hemodynamics,respiratory and cardiac monitoring, neurological assessment, discussion with family, other specialists and medical decision making of high complexity. I spent 40 minutes of neurocritical care time  in the care of  this patient.  Roland Rack, MD [facility] [phone]  If 7pm- 7am, please page neurology on call as listed in [place]. 05/21/2018  11:08 AM

## 2018-05-22 ENCOUNTER — Inpatient Hospital Stay (HOSPITAL_COMMUNITY): Payer: 59

## 2018-05-22 LAB — COMPREHENSIVE METABOLIC PANEL
ALBUMIN: 2.7 g/dL — AB (ref 3.5–5.0)
ALT: 16 U/L (ref 0–44)
AST: 20 U/L (ref 15–41)
Alkaline Phosphatase: 74 U/L (ref 38–126)
Anion gap: 8 (ref 5–15)
BUN: 7 mg/dL (ref 6–20)
CHLORIDE: 110 mmol/L (ref 98–111)
CO2: 27 mmol/L (ref 22–32)
CREATININE: 0.73 mg/dL (ref 0.44–1.00)
Calcium: 8.2 mg/dL — ABNORMAL LOW (ref 8.9–10.3)
GFR calc Af Amer: >60 mL/min (ref >60)
GLUCOSE: 132 mg/dL — AB (ref 70–99)
POTASSIUM: 3.8 mmol/L (ref 3.5–5.1)
Sodium: 145 mmol/L (ref 135–145)
Total Bilirubin: 0.6 mg/dL (ref 0.3–1.2)
Total Protein: 5.9 g/dL — ABNORMAL LOW (ref 6.5–8.1)

## 2018-05-22 LAB — GLUCOSE, CAPILLARY
GLUCOSE-CAPILLARY: 113 mg/dL — AB (ref 70–99)
GLUCOSE-CAPILLARY: 106 mg/dL — AB (ref 70–99)
Glucose-Capillary: 102 mg/dL — ABNORMAL HIGH (ref 70–99)
Glucose-Capillary: 170 mg/dL — ABNORMAL HIGH (ref 70–99)
Glucose-Capillary: 131 mg/dL — ABNORMAL HIGH (ref 70–99)
Glucose-Capillary: 105 mg/dL — ABNORMAL HIGH (ref 70–99)

## 2018-05-22 LAB — CBC WITH DIFFERENTIAL/PLATELET
ABS IMMATURE GRANULOCYTES: 0.1 10*3/uL (ref 0.0–0.1)
Basophils Absolute: 0 10*3/uL (ref 0.0–0.1)
Basophils Relative: 0 %
EOS PCT: 2 %
Eosinophils Absolute: 0.2 10*3/uL (ref 0.0–0.7)
HEMATOCRIT: 35.2 % — AB (ref 36.0–46.0)
Hemoglobin: 10.9 g/dL — ABNORMAL LOW (ref 12.0–15.0)
Immature Granulocytes: 1 %
LYMPHS PCT: 15 %
Lymphs Abs: 1.3 10*3/uL (ref 0.7–4.0)
MCH: 27.2 pg (ref 26.0–34.0)
MCHC: 31 g/dL (ref 30.0–36.0)
MCV: 87.8 fL (ref 78.0–100.0)
MONO ABS: 0.9 10*3/uL (ref 0.1–1.0)
MONOS PCT: 10 %
NEUTROS ABS: 6.2 10*3/uL (ref 1.7–7.7)
Neutrophils Relative %: 72 %
PLATELETS: 159 10*3/uL (ref 150–400)
RBC: 4.01 MIL/uL (ref 3.87–5.11)
RDW: 14.5 % (ref 11.5–15.5)
WBC: 8.6 10*3/uL (ref 4.0–10.5)

## 2018-05-22 LAB — PHENYTOIN LEVEL, TOTAL: PHENYTOIN LVL: 19 ug/mL (ref 10.0–20.0)

## 2018-05-22 MED ORDER — PHENYTOIN SODIUM 50 MG/ML IJ SOLN
75.0000 mg | Freq: Two times a day (BID) | INTRAMUSCULAR | Status: DC
  Administered 2018-05-22: 75 mg via INTRAVENOUS
  Filled 2018-05-22 (×2): qty 2

## 2018-05-22 NOTE — Progress Notes (Signed)
Wasted 72mls of versed from bag down sink. Witnessed by Beacher May, RN

## 2018-05-22 NOTE — Procedures (Signed)
Extubation Procedure Note  Patient Details:   Name: Carly [name] DOB: June 20, 1971 MRN: [id number]   Airway Documentation:    Vent end date: 05/22/18 Vent end time: 1050   Evaluation  O2 sats: stable throughout Complications: No apparent complications Patient did tolerate procedure well. Bilateral Breath Sounds: Rhonchi, Diminished   Yes  Soft spoken at baseline  RT extubated patient to 2L [place] per order with RN at bedside. Positive cuff leak noted and patient has a good cough. Patient is currently sating 100% and RT will continue to monitor.   Vernona Rieger 05/22/2018, 10:53 AM

## 2018-05-22 NOTE — Progress Notes (Signed)
Subjective: No seizures, now off als sedation x 24 hours  Exam: Vitals:   05/22/18 0742 05/22/18 0800  BP: 123/69 (!) 114/103  Pulse: 90   Resp: 17 16  Temp:  99.7 F (37.6 C)  SpO2: 100%    Gen: In bed, intubated Resp: ventilated.  Abd: soft, nt  Neuro: She opens her eyes, EOMI with  nystagmus. She does blink to threat. She does not follow commands. 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 Given the degree of deficit, the decision was made to proceed with burst suppression with propofol. She was suppressed from 7pm 9/1 until the morning of 9/3. Resumption of seizures with weaning of propofol and resolved with midazolam. Midazolam reduced to sedation dose afternoon of 9/4 and discontinued completely morning of 9/5. Sz free since 9/3  Recommendations: 1) Dilantin 75mg  BID, continue daily levels 2) d/c eeg 3) continue keppra 1500mg  BID 4) trileptal 900mg  BID 5) vimpat 200mg  BID.  6) OXC level  This patient is critically ill and at significant risk of neurological worsening, death and care requires constant monitoring of vital signs, hemodynamics,respiratory and cardiac monitoring, neurological assessment, discussion with family, other specialists and medical decision making of high complexity. I spent 35 minutes of neurocritical care time  in the care of  this patient.  Roland Rack, MD [facility] [phone]  If 7pm- 7am, please page neurology on call as listed in [place]. 05/22/2018  8:28 AM

## 2018-05-22 NOTE — Progress Notes (Signed)
Patient waking up and coughing/gagging frequently with ETT without continuous sedation. PRN morphine given to help with comfort/sedation for vent. Patient still following simple commands with delayed responses.

## 2018-05-22 NOTE — Evaluation (Signed)
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 Sz 9/1. 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  pt sedated for burst suppression for 48 hours,  Seizure control achieved on 9/3. and sedation weaned 9/5.  Pt extubated 9/6.  PMhx: SAH s/p aneurysm coiling 02/2017, adjustment d/o, HTN, GERD.   Clinical Impression   Unsure of Pt's level of independence. SHe is currently total A for all ADL at this time. She did demonstrate spontaneous use of RUE but was only able to reach to her neck (it appeared as though she was trying to itch her nose) She also verbalized "I don't need your help" during the session. No focus in gaze, left gaze preference, nystagmus noted throughout. Pt will continue to benefit from skilled OT in the acute setting as well as post-acute TBD depending on how Pt progresses.     Follow Up Recommendations  Other (comment)(TBD as Pt progresses to more therapeutic level)    Equipment Recommendations  Other (comment)(TBD)    Recommendations for Other Services Speech consult     Precautions / Restrictions Precautions Precautions: Fall(seizure) Restrictions Weight Bearing Restrictions: No      Mobility Bed Mobility Overal bed mobility: Needs Assistance Bed Mobility: Supine to Sit;Sit to Supine     Supine to sit: Total assist;+2 for physical assistance Sit to supine: Total assist;+2 for physical assistance   General bed mobility comments: pt with no initiation of assist to sit up EOB  Transfers                 General transfer comment: not tested    Balance Overall balance assessment: Needs assistance Sitting-balance support: Single extremity supported;No upper extremity supported Sitting balance-Leahy Scale:  Zero Sitting balance - Comments: little truncal response to balance challenges or attempt to hold upright midline posture.                                   ADL either performed or assessed with clinical judgement   ADL Overall ADL's : Needs assistance/impaired                                       General ADL Comments: Pt is currently total A for all ADL     Vision   Vision Assessment?: Vision impaired- to be further tested in functional context Additional Comments: Pt will not focus gaze, will turn head stimulus, left gaze preference     Perception     Praxis      Pertinent Vitals/Pain Pain Assessment: Faces Faces Pain Scale: No hurt Pain Intervention(s): Monitored during session     Hand Dominance     Extremity/Trunk Assessment Upper Extremity Assessment Upper Extremity Assessment: RUE deficits/detail;LUE deficits/detail RUE Deficits / Details: moves spontaneously - but will not follow commands - able to bring hand up to neck and no further RUE: (cognition impacting assessment) RUE Sensation: decreased light touch RUE Coordination: decreased fine motor;decreased gross motor LUE Deficits / Details: no movement noted, did not pull away from nail bed pressure LUE Sensation: decreased light touch;decreased proprioception LUE Coordination: decreased fine motor;decreased gross motor   Lower Extremity Assessment Lower  Extremity Assessment: Defer to PT evaluation RLE Deficits / Details: no spontaneous movements noted LLE Deficits / Details: no spontaneous movement   Cervical / Trunk Assessment Cervical / Trunk Assessment: Normal   Communication Communication Communication: Other (comment)(Little phonation to hear pt's answers)   Cognition Arousal/Alertness: Lethargic Behavior During Therapy: Flat affect Overall Cognitive Status: No family/caregiver present to determine baseline cognitive functioning                                  General Comments: pt appears to have >10 secs response delay.  Pt not following commands.   General Comments  VSS throughout session, Pt verbalized several times "I don't need your help"    Exercises Exercises: Other exercises Other Exercises Other Exercises: warm up Bil LE ROM. Other Exercises: bil UE warm up ROM   Shoulder Instructions      Home Living Family/patient expects to be discharged to:: Private residence Living Arrangements: Spouse/significant other;Children                               Additional Comments: no family present, Pt unable to answer details      Prior Functioning/Environment          Comments: no family available to ascertain PLOF and pt unable to relate        OT Problem List: Decreased strength;Decreased range of motion;Decreased activity tolerance;Impaired balance (sitting and/or standing);Impaired vision/perception;Decreased coordination;Decreased cognition;Decreased safety awareness;Decreased knowledge of use of DME or AE;Decreased knowledge of precautions;Impaired sensation;Impaired UE functional use;Increased edema      OT Treatment/Interventions: Self-care/ADL training;Therapeutic exercise;DME and/or AE instruction;Manual therapy;Therapeutic activities;Neuromuscular education;Cognitive remediation/compensation;Visual/perceptual remediation/compensation;Patient/family education;Balance training    OT Goals(Current goals can be found in the care plan section) Acute Rehab OT Goals Patient Stated Goal: none stated ADL Goals Pt Will Perform Grooming: with max assist;bed level Additional ADL Goal #1: Pt will perform bed mobility prior to ADL participation at mod A  OT Frequency: Min 2X/week   Barriers to D/C:            Co-evaluation PT/OT/SLP Co-Evaluation/Treatment: Yes Reason for Co-Treatment: Complexity of the patient's impairments (multi-system involvement);Necessary to address cognition/behavior during  functional activity;For patient/therapist safety;To address functional/ADL transfers PT goals addressed during session: Mobility/safety with mobility;Balance;Strengthening/ROM OT goals addressed during session: ADL's and self-care;Proper use of Adaptive equipment and DME;Strengthening/ROM      AM-PAC PT "6 Clicks" Daily Activity     Outcome Measure Help from another person eating meals?: Total Help from another person taking care of personal grooming?: Total Help from another person toileting, which includes using toliet, bedpan, or urinal?: Total Help from another person bathing (including washing, rinsing, drying)?: Total Help from another person to put on and taking off regular upper body clothing?: Total Help from another person to put on and taking off regular lower body clothing?: Total 6 Click Score: 6   End of Session Equipment Utilized During Treatment: Oxygen(2L) Nurse Communication: Mobility status;Need for lift equipment  Activity Tolerance: Patient limited by lethargy Patient left: in bed;with call bell/phone within reach;with bed alarm set;with nursing/sitter in room;with SCD's reapplied  OT Visit Diagnosis: Muscle weakness (generalized) (M62.81);Other symptoms and signs involving cognitive function                Time: 1140-1201 OT Time Calculation (min): 21 min Charges:  OT General Charges $OT Visit: 1 Visit  OT Evaluation $OT Eval Moderate Complexity: [place] OTR/L Acute Rehabilitation Services Pager: [phone] Office: [place] 05/22/2018, 3:50 PM

## 2018-05-22 NOTE — Procedures (Signed)
LTM-EEG Report  HISTORY: Continuous video-EEG monitoring performed for61year old with focal status epilepticus. ACQUISITION: International 10-20 system for electrode placement; 18 channels with additional eyes linked to ipsilateral ears and EKG. Additional T1-T2 electrodes were used. Continuous video recording obtained.   EEG NUMBER:  MEDICATIONS: Day5: see EMR  DAY #5: [id number] 9/5/19to 0730 05/22/18  BACKGROUND: An overallmediumvoltage,continuous record with some spontaneous variability and reactivity. The background consisted of irregular/semirhythmic 2-7Hz  activity bilaterally with frequent superimposed rhythmic theta activity bilaterally. Activity was generally slower with loss of detail over the left hemisphere. Some brief periods of wakefulness were captured with improved background features. EPILEPTIFORM/PERIODIC ACTIVITY:none SEIZURES:none EVENTS:none reported  EKG: no significant arrhythmia  SUMMARY: This wasan abnormal continuous video EEG due to generalized background slowing maximal over the left. The background showed improved frequencies over the past 24 hours. This was indicative of a diffuse or multifocal cerebral disturbance. No further seizures were seen.

## 2018-05-22 NOTE — Progress Notes (Signed)
SLP Cancellation Note  Patient Details Name: ANJANNETTE GAUGER MRN: [id number] DOB: 03-Feb-1971   Cancelled treatment:       Reason Eval/Treat Not Completed: Medical issues which prohibited therapy. Will sign off at this time. Please reorder when appropriate.    Ylonda Storr, Katherene Ponto 05/22/2018, 7:31 AM

## 2018-05-22 NOTE — Progress Notes (Signed)
  NEUROSURGERY PROGRESS NOTE   Pt seen and examined. No issues overnight.   EXAM: Temp:  [99.7 F (37.6 C)-101.1 F (38.4 C)] 101.1 F (38.4 C) (09/06 2100) Pulse Rate:  [90-101] 101 (09/06 2100) Resp:  [11-25] 21 (09/06 2100) BP: (101-173)/(53-133) 168/109 (09/06 2100) SpO2:  [95 %-100 %] 95 % (09/06 2100) FiO2 (%):  [30 %] 30 % (09/06 1001) Weight:  [97.6 kg] 97.6 kg (09/06 0426) Intake/Output      09/06 0701 - 09/07 0700   I.V. (mL/kg) 766.3 (7.9)   NG/GT 135   IV Piggyback 145   Total Intake(mL/kg) 1046.3 (10.7)   Urine (mL/kg/hr) 1800 (1.3)   Total Output 1800   Net -753.7       Urine Occurrence 1 x     Eyes open spontaneously Breathing over vent Moving LUE/LLE > RUE/RLE purposefully Per RN, was intermittently FC  LABS: Lab Results  Component Value Date   CREATININE 0.73 05/22/2018   BUN 7 05/22/2018   NA 145 05/22/2018   K 3.8 05/22/2018   CL 110 05/22/2018   CO2 27 05/22/2018   Lab Results  Component Value Date   WBC 8.6 05/22/2018   HGB 10.9 (L) 05/22/2018   HCT 35.2 (L) 05/22/2018   MCV 87.8 05/22/2018   PLT 159 05/22/2018    IMPRESSION: - 47 y.o. female s/p embolization of aneurysm, postop status. Has been sz free over 24 hrs, awakening from sedation after burst suppression.  PLAN: - Cont supportive care. Spoke with neurology, will d/c EEG today as no SZ > 24hrs - Cont AEDs

## 2018-05-22 NOTE — Progress Notes (Signed)
Cortrak Tube Team Note:  Consult received to place a Cortrak feeding tube.   A 10 F Cortrak tube was placed in the LEFT nare and secured with a nasal bridle at 69 cm. Per the Cortrak monitor reading the tube tip is gastric.   No x-ray is required. RN may begin using tube.   If the tube becomes dislodged please keep the tube and contact the Cortrak team at [email or web address] (password [email or web address]) for replacement.  If after hours and replacement cannot be delayed, place a NG tube and confirm placement with an abdominal x-ray.   Kerman Passey MS, RD, [place], [place] [phone] Pager  [phone] Weekend/On-Call Pager

## 2018-05-22 NOTE — Progress Notes (Signed)
Upon return from MRI patients clamped cortrak was noted to now be at 35cm secured by nasal bridle instead of 69cm charted by RD.  Cortrak removed and saved at bedside.  NG tube successfully placed after 2 attempts. Patient vomited x1. ABD XR placed per protocol for placement.

## 2018-05-22 NOTE — Progress Notes (Signed)
Carly [name]  CZY:[id number] DOB: 1970-10-23 DOA: 05/15/2018 PCP: [name], MD    LOS: 7 days   Reason for Consult / Chief Complaint:  Respiratory failure due to status epilepticus  Consulting MD and date of consult:  Carly [name] - 05/17/2018  HPI/summary of hospital stay:  The patient is a 47 year old woman who developed status epilepticus following elective coiling of a L ICA aneurysm.   Prior [facility] with coiling 6.18 with seizures at that time. Enlarging residual aneurysm on routine follow-up imaging.   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  Started on sedative infusions for burst suppression. Burst suppression maintained for 48h, but seizures recurred after propofol weaned down. Clinical seizure control once again achieved with initiation of midazolam 05/19/18.  Seizures eventually resolved. And remained abated with weaning of sedative infusions on 9/5. AED therapies continued with Dilantin, keppra, trileptal, and vimpat. She was awake and following commands and was extubated 9/6.   Subjective   No further clinical or EEG seizures. Midazolam stopped 05/21/18. She is awake and wanting to be extubated this morning.   Assessment & Plan:   Status epilepticus following percutaneous neurovascular intervention in patient with a baseline history of seizures related to prior subarachnoid hemorrhage. Clinical seizure activity controlled on midazolam, now off. No correctable abnormality on CT.  Patient now seizure free and awake.  - Continue Keppra 1500 mg twice daily, phenytoin 75 mg BID daily, oxcarbazepine 600 mg twice daily, and Vimpat 200mg  q12h..  Follow phenytoin levels.  Acute respiratory failure secondary to inability to protect airway. Much more awake today. Good cough. Secretions improved.   - Will plan on extubation today  Status post prior ICA  subarachnoid hemorrhage with recurrent aneurysm. Successful diversion stenting of aneurysm.  - Continue dual antiplatelet therapy.  Hypokalemia. - monitor  Fever  - Currently controlled with acetaminophen and ibuprofen.  - Culture sputum - hold on antibiotics for now.  Best Practice / Goals of Care / Disposition.   DVT prophylaxis: Unfractionated heparin GI prophylaxis: Protonix Diet: NPO for 4 hours post extubation then RN to advance as tolerated to regular.  Mobility: initiate mobility once patient more awake. Code Status: Full code Family Communication: Family members updated at bedside.  Disposition / Summary of Today's Plan 05/22/18   Extubate today   Consultants: date of consult/date signed off (if applicable)/final recs   Neurology.  Procedures:  8/30 elective embolization L ICA aneurysm using Surpass embolization and diversion stent.  Significant Diagnostic Tests: n/a  Micro Data: None  Antimicrobials:  Sputum 9/5   Objective    Examination: General: adult  Middle aged female awake on vent.  HENT:Foster/AT, PERRL, no JVD Lungs: Clear bilateral breath sounds. Secretions improved.  Cardiovascular: RRR, no MRG. No edema.  Abdomen: Abdomen soft and nontender Extremities: No acute deformity Neuro: Awake, alert, follows commands. Good strength.  GU: Foley catheter in place.  Blood pressure (!) 144/80, pulse 90, temperature 99.7 F (37.6 C), resp. rate 16, height 5\' 4"  (1.626 m), weight 97.6 kg, SpO2 100 %.    Vent Mode: CPAP;PSV FiO2 (%):  [30 %] 30 % Set Rate:  [16 bmp] 16 bmp Vt Set:  [400 mL] 400 mL PEEP:  [5 cmH20] 5 cmH20 Pressure Support:  [5 cmH20] 5 cmH20 Plateau Pressure:  [15 cmH20-16 cmH20] 15 cmH20   Intake/Output Summary (Last 24 hours)  at 05/22/2018 1032 Last data filed at 05/22/2018 1000 Gross per 24 hour  Intake 3111.95 ml  Output 4225 ml  Net -1113.05 ml   Filed Weights   05/19/18 0500 05/21/18 0500 05/22/18 0426  Weight: 112.8 kg 97.3  kg 97.6 kg     Labs    CBC: Recent Labs  Lab 05/16/18 0535 05/19/18 0511 05/20/18 0414 05/21/18 0448 05/22/18 0428  WBC 9.9 6.4 9.4 10.4 8.6  NEUTROABS  --   --  7.6 8.3* 6.2  HGB 11.4* 10.5* 10.4* 10.7* 10.9*  HCT 35.1* 34.0* 32.6* 34.0* 35.2*  MCV 84.4 88.5 87.4 86.5 87.8  PLT 174 157 125* 133* 867   Basic Metabolic Panel: Recent Labs  Lab 05/17/18 0953 05/18/18 1404 05/18/18 1711 05/19/18 0511 05/19/18 1454 05/20/18 0414 05/21/18 0448 05/22/18 0428  NA 143  --   --  147*  --  148* 145 145  K 4.3  --   --  3.2*  --  2.5* 3.3* 3.8  CL 113*  --   --  118*  --  115* 107 110  CO2 21*  --   --  25  --  27 27 27   GLUCOSE 96  --   --  91  --  193* 128* 132*  BUN 5*  --   --  9  --  12 9 7   CREATININE 0.92  --   --  0.98  --  0.83 0.74 0.73  CALCIUM 8.9  --   --  7.5*  --  7.5* 7.7* 8.2*  MG  --  2.0 2.0 1.9 1.8  --   --   --   PHOS  --  2.4* 2.6 2.0* 1.2*  --  2.2*  --    GFR: Estimated Creatinine Clearance: 96.6 mL/min (by C-G formula based on SCr of 0.73 mg/dL). Recent Labs  Lab 05/19/18 0511 05/20/18 0414 05/21/18 0448 05/22/18 0428  WBC 6.4 9.4 10.4 8.6   Liver Function Tests: Recent Labs  Lab 05/18/18 0920 05/20/18 0414 05/21/18 0448 05/22/18 0428  AST  --  15 21 20   ALT  --  11 15 16   ALKPHOS  --  50 64 74  BILITOT  --  0.5 0.6 0.6  PROT  --  4.8* 5.2* 5.9*  ALBUMIN 2.9* 2.4* 2.5* 2.7*   No results for input(s): LIPASE, AMYLASE in the last 168 hours. No results for input(s): AMMONIA in the last 168 hours. ABG    Component Value Date/Time   PHART 7.421 05/17/2018 2048   PCO2ART 43.0 05/17/2018 2048   PO2ART 544.0 (H) 05/17/2018 2048   HCO3 27.9 05/17/2018 2048   TCO2 29 05/17/2018 2048   ACIDBASEDEF 10.0 (H) 03/10/2017 0339   O2SAT 100.0 05/17/2018 2048    Coagulation Profile: No results for input(s): INR, PROTIME in the last 168 hours. Cardiac Enzymes: Recent Labs  Lab 05/15/18 1809  TROPONINI <0.03   HbA1C: Hgb A1c MFr Bld    Date/Time Value Ref Range Status  07/23/2013 01:16 PM 5.9 (H) 4.8 - 5.6 % Final    Comment:             Increased risk for diabetes: 5.7 - 6.4          Diabetes: >6.4          Glycemic control for adults with diabetes: <7.0   CBG: Recent Labs  Lab 05/21/18 1553 05/21/18 1938 05/21/18 2344 05/22/18 0340 05/22/18 0723  GLUCAP 115* 216* 136* 102*  170*   Results for JALIYA, SIEGMANN (MRN [id number]) as of 05/21/2018 10:25  Ref. Range 05/17/2018 09:53 05/19/2018 14:54 05/20/2018 04:14 05/21/2018 04:48  Phenytoin Lvl Latest Ref Range: 10.0 - 20.0 ug/mL 18.2 14.4 14.5 17.4     Georgann Housekeeper, [facility] [place] Pulmonology/Critical Care Pager [phone] or [phone]  05/22/2018 10:49 AM

## 2018-05-22 NOTE — Evaluation (Signed)
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 Sz 9/1. 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  pt sedated for burst suppression for 48 hours,  Seizure control achieved on 9/3. and sedation weaned 9/5.  Pt extubated 9/6.  PMhx: SAH s/p aneurysm coiling 02/2017, adjustment d/o, HTN, GERD.  Clinical Impression  Pt admitted with/for repeat embolization fof ICA aneurysm with complications stated above.  Pt needing total assist with limited mobility at this time.  Pt currently limited functionally due to the problems listed. ( See problems list.)   Pt will benefit from PT to maximize function and safety in order to get ready for next venue listed below.     Follow Up Recommendations Other (comment)(unable to make a determination with limited eval.)    Equipment Recommendations  Other (comment)(TBA)    Recommendations for Other Services Other (comment)     Precautions / Restrictions Precautions Precautions: Fall(seizure)      Mobility  Bed Mobility Overal bed mobility: Needs Assistance Bed Mobility: Supine to Sit;Sit to Supine     Supine to sit: Total assist;+2 for physical assistance Sit to supine: Total assist;+2 for physical assistance   General bed mobility comments: pt with no initiation of assist to sit up EOB  Transfers                 General transfer comment: not tested  Ambulation/Gait             General Gait Details: not able  Stairs            Wheelchair Mobility    Modified Rankin (Stroke Patients Only) Modified Rankin (Stroke Patients Only) Pre-Morbid Rankin Score: No symptoms Modified Rankin: Severe disability     Balance Overall balance assessment: Needs assistance Sitting-balance support:  Single extremity supported;No upper extremity supported Sitting balance-Leahy Scale: Zero Sitting balance - Comments: little truncal response to balance challenges or attempt to hold upright midline posture.                                     Pertinent Vitals/Pain Pain Assessment: Faces Faces Pain Scale: No hurt    Home Living Family/patient expects to be discharged to:: Private residence Living Arrangements: Spouse/significant other;Children                    Prior Function           Comments: no family available to ascertain PLOF and pt unable to relate     Hand Dominance        Extremity/Trunk Assessment   Upper Extremity Assessment Upper Extremity Assessment: Defer to OT evaluation(spontaneous movement of R UE, pulling hand up on stomach)    Lower Extremity Assessment Lower Extremity Assessment: RLE deficits/detail;LLE deficits/detail RLE Deficits / Details: no spontaneous movements noted LLE Deficits / Details: no spontaneous movement       Communication   Communication: Other (comment)(Little phonation to hear pt's answers)  Cognition Arousal/Alertness: Lethargic Behavior During Therapy: Flat affect Overall Cognitive Status: No family/caregiver present to determine baseline cognitive functioning  General Comments: pt appears to have >10 secs response delay.  Pt not following commands.      General Comments General comments (skin integrity, edema, etc.): vss overall.  pt mouthing "I don't need your help"    Exercises Other Exercises Other Exercises: warm up Bil LE ROM. Other Exercises: bil UE warm up ROM   Assessment/Plan    PT Assessment Patient needs continued PT services  PT Problem List Decreased strength;Decreased activity tolerance;Decreased balance;Decreased mobility;Decreased coordination;Decreased cognition;Impaired sensation       PT Treatment Interventions Gait  training;Functional mobility training;Therapeutic activities;Balance training;Neuromuscular re-education;Patient/family education    PT Goals (Current goals can be found in the Care Plan section)  Acute Rehab PT Goals PT Goal Formulation: Patient unable to participate in goal setting Time For Goal Achievement: 06/05/18 Potential to Achieve Goals: Fair    Frequency Min 3X/week   Barriers to discharge        Co-evaluation               AM-PAC PT "6 Clicks" Daily Activity  Outcome Measure Difficulty turning over in bed (including adjusting bedclothes, sheets and blankets)?: Unable Difficulty moving from lying on back to sitting on the side of the bed? : Unable Difficulty sitting down on and standing up from a chair with arms (e.g., wheelchair, bedside commode, etc,.)?: Unable Help needed moving to and from a bed to chair (including a wheelchair)?: Total Help needed walking in hospital room?: Total Help needed climbing 3-5 steps with a railing? : Total 6 Click Score: 6    End of Session     Patient left: in bed;with call bell/phone within reach;with bed alarm set Nurse Communication: Mobility status PT Visit Diagnosis: Other abnormalities of gait and mobility (R26.89);Muscle weakness (generalized) (M62.81);Other symptoms and signs involving the nervous system (R29.898)    Time: 1140-1201 PT Time Calculation (min) (ACUTE ONLY): 21 min   Charges:   PT Evaluation $PT Eval Moderate Complexity: 1 Mod          05/22/2018  Donnella Sham, PT [phone]  (pager)  Tessie Fass Thula Stewart 05/22/2018, 1:01 PM

## 2018-05-22 NOTE — Progress Notes (Signed)
LTM discontinued. No skin breakdown was seen. Dr Sass notified. 

## 2018-05-23 ENCOUNTER — Other Ambulatory Visit: Payer: Self-pay

## 2018-05-23 DIAGNOSIS — R0902 Hypoxemia: Secondary | ICD-10-CM

## 2018-05-23 DIAGNOSIS — G9340 Encephalopathy, unspecified: Secondary | ICD-10-CM

## 2018-05-23 DIAGNOSIS — E876 Hypokalemia: Secondary | ICD-10-CM

## 2018-05-23 LAB — CBC WITH DIFFERENTIAL/PLATELET
Abs Immature Granulocytes: 0.1 10*3/uL (ref 0.0–0.1)
Basophils Absolute: 0.1 10*3/uL (ref 0.0–0.1)
Basophils Relative: 1 %
EOS ABS: 0.2 10*3/uL (ref 0.0–0.7)
EOS PCT: 2 %
HEMATOCRIT: 38.2 % (ref 36.0–46.0)
HEMOGLOBIN: 11.8 g/dL — AB (ref 12.0–15.0)
Immature Granulocytes: 1 %
LYMPHS ABS: 2 10*3/uL (ref 0.7–4.0)
LYMPHS PCT: 20 %
MCH: 26.8 pg (ref 26.0–34.0)
MCHC: 30.9 g/dL (ref 30.0–36.0)
MCV: 86.6 fL (ref 78.0–100.0)
MONO ABS: 0.9 10*3/uL (ref 0.1–1.0)
MONOS PCT: 9 %
Neutro Abs: 6.4 10*3/uL (ref 1.7–7.7)
Neutrophils Relative %: 67 %
Platelets: 223 10*3/uL (ref 150–400)
RBC: 4.41 MIL/uL (ref 3.87–5.11)
RDW: 14.1 % (ref 11.5–15.5)
WBC: 9.6 10*3/uL (ref 4.0–10.5)

## 2018-05-23 LAB — GLUCOSE, CAPILLARY
GLUCOSE-CAPILLARY: 127 mg/dL — AB (ref 70–99)
GLUCOSE-CAPILLARY: 134 mg/dL — AB (ref 70–99)
Glucose-Capillary: 129 mg/dL — ABNORMAL HIGH (ref 70–99)
Glucose-Capillary: 125 mg/dL — ABNORMAL HIGH (ref 70–99)
Glucose-Capillary: 119 mg/dL — ABNORMAL HIGH (ref 70–99)
Glucose-Capillary: 141 mg/dL — ABNORMAL HIGH (ref 70–99)

## 2018-05-23 LAB — BASIC METABOLIC PANEL
Anion gap: 10 (ref 5–15)
BUN: 9 mg/dL (ref 6–20)
CALCIUM: 8.7 mg/dL — AB (ref 8.9–10.3)
CHLORIDE: 106 mmol/L (ref 98–111)
CO2: 26 mmol/L (ref 22–32)
CREATININE: 0.77 mg/dL (ref 0.44–1.00)
GFR calc non Af Amer: >60 mL/min (ref >60)
Glucose, Bld: 125 mg/dL — ABNORMAL HIGH (ref 70–99)
Potassium: 3.4 mmol/L — ABNORMAL LOW (ref 3.5–5.1)
Sodium: 142 mmol/L (ref 135–145)

## 2018-05-23 LAB — CULTURE, RESPIRATORY W GRAM STAIN

## 2018-05-23 LAB — CULTURE, RESPIRATORY

## 2018-05-23 LAB — PHENYTOIN LEVEL, TOTAL: Phenytoin Lvl: 19.5 ug/mL (ref 10.0–20.0)

## 2018-05-23 MED ORDER — CLOPIDOGREL BISULFATE 75 MG PO TABS
75.0000 mg | ORAL_TABLET | Freq: Every day | ORAL | Status: DC
  Administered 2018-05-24: 75 mg
  Filled 2018-05-23: qty 1

## 2018-05-23 MED ORDER — ASPIRIN 325 MG PO TABS
325.0000 mg | ORAL_TABLET | Freq: Every day | ORAL | Status: DC
  Administered 2018-05-23: 325 mg via ORAL
  Filled 2018-05-23: qty 1

## 2018-05-23 MED ORDER — OXCARBAZEPINE 300 MG PO TABS
900.0000 mg | ORAL_TABLET | Freq: Two times a day (BID) | ORAL | Status: DC
  Administered 2018-05-23 – 2018-05-24 (×2): 900 mg
  Filled 2018-05-23 (×3): qty 3

## 2018-05-23 MED ORDER — CEFAZOLIN SODIUM-DEXTROSE 2-4 GM/100ML-% IV SOLN
2.0000 g | Freq: Three times a day (TID) | INTRAVENOUS | Status: DC
  Administered 2018-05-23 – 2018-05-28 (×16): 2 g via INTRAVENOUS
  Filled 2018-05-23 (×16): qty 100

## 2018-05-23 MED ORDER — ONDANSETRON HCL 4 MG PO TABS: 4.0000 mg | ORAL_TABLET | ORAL | Status: DC | PRN

## 2018-05-23 MED ORDER — POTASSIUM CHLORIDE 20 MEQ/15ML (10%) PO SOLN
40.0000 meq | Freq: Once | ORAL | Status: AC
  Administered 2018-05-23: 40 meq
  Filled 2018-05-23: qty 30

## 2018-05-23 MED ORDER — ASPIRIN 325 MG PO TABS
325.0000 mg | ORAL_TABLET | Freq: Every day | ORAL | Status: DC
  Administered 2018-05-24: 325 mg
  Filled 2018-05-23: qty 1

## 2018-05-23 MED ORDER — SODIUM CHLORIDE 0.9 % IV SOLN
INTRAVENOUS | Status: DC
  Administered 2018-05-23 – 2018-05-27 (×5): via INTRAVENOUS

## 2018-05-23 MED ORDER — ADULT MULTIVITAMIN W/MINERALS CH
1.0000 | ORAL_TABLET | Freq: Every day | ORAL | Status: DC
  Administered 2018-05-24: 1
  Filled 2018-05-23: qty 1

## 2018-05-23 MED ORDER — HYDROCODONE-ACETAMINOPHEN 5-325 MG PO TABS: 1.0000 | ORAL_TABLET | ORAL | Status: DC | PRN

## 2018-05-23 MED ORDER — ONDANSETRON HCL 4 MG/2ML IJ SOLN: 4.0000 mg | INTRAMUSCULAR | Status: DC | PRN

## 2018-05-23 MED ORDER — CHLORHEXIDINE GLUCONATE 0.12 % MT SOLN
15.0000 mL | Freq: Two times a day (BID) | OROMUCOSAL | Status: DC
  Administered 2018-05-23 – 2018-05-25 (×5): 15 mL via OROMUCOSAL
  Filled 2018-05-23 (×5): qty 15

## 2018-05-23 MED ORDER — ORAL CARE MOUTH RINSE
15.0000 mL | Freq: Two times a day (BID) | OROMUCOSAL | Status: DC
  Administered 2018-05-23 – 2018-05-24 (×4): 15 mL via OROMUCOSAL

## 2018-05-23 NOTE — Evaluation (Signed)
Clinical/Bedside Swallow Evaluation Patient Details  Name: Carly [name] MRN: [id number] Date of Birth: 01-27-1971  Today's Date: 05/23/2018 Time: SLP Start Time (ACUTE ONLY): 74 SLP Stop Time (ACUTE ONLY): 1612 SLP Time Calculation (min) (ACUTE ONLY): 20 min  Past Medical History:  Past Medical History:  Diagnosis Date  . GERD (gastroesophageal reflux disease)   . Hypertension   . Memory difficulties    short term memory  . Migraines   . Seizures ([place])    with hemorrhage  . Subarachnoid hemorrhage ([facility])    d/t burst aneurysm, had seizures   Past Surgical History:  Past Surgical History:  Procedure Laterality Date  . CESAREAN SECTION    . ESOPHAGOGASTRODUODENOSCOPY N/A 04/01/2017   Procedure: ESOPHAGOGASTRODUODENOSCOPY (EGD);  Surgeon: Georganna Skeans, MD;  Location: [place];  Service: General;  Laterality: N/A;  . IR ANGIO INTRA EXTRACRAN SEL INTERNAL CAROTID UNI R MOD SED  03/03/2017  . IR ANGIO VERTEBRAL SEL SUBCLAVIAN INNOMINATE BILAT MOD SED  02/10/2018  . IR ANGIO VERTEBRAL SEL VERTEBRAL BILAT MOD SED  03/03/2017  . IR ANGIO VERTEBRAL SEL VERTEBRAL BILAT MOD SED  02/10/2018  . IR ANGIOGRAM FOLLOW UP STUDY  03/03/2017  . IR ANGIOGRAM FOLLOW UP STUDY  03/03/2017  . IR ANGIOGRAM FOLLOW UP STUDY  03/03/2017  . IR ANGIOGRAM FOLLOW UP STUDY  03/03/2017  . IR REPLC GASTRO/COLONIC TUBE PERCUT W/FLUORO  04/15/2017  . IR TRANSCATH/EMBOLIZ  03/03/2017  . LAPAROSCOPY     in early 90s  . PEG PLACEMENT N/A 04/01/2017   Procedure: PERCUTANEOUS ENDOSCOPIC GASTROSTOMY (PEG) PLACEMENT;  Surgeon: Georganna Skeans, MD;  Location: [place];  Service: General;  Laterality: N/A;  . RADIOLOGY WITH ANESTHESIA N/A 03/03/2017   Procedure: RADIOLOGY WITH ANESTHESIA;  Surgeon: Consuella Lose, MD;  Location: [place];  Service: Radiology;  Laterality: N/A;  . RADIOLOGY WITH ANESTHESIA N/A 05/15/2018   Procedure: Arteriogram, Embolization of recurrent aneurysm;  Surgeon: Consuella Lose, MD;   Location: [place];  Service: Radiology;  Laterality: N/A;   HPI:  47 y.o. female admitted due to recurrence of left supraclinoid aneurysm after treatment with coil embolization after rupture June 2018.  Underwent elective Surpass embolization 8/30. Mild aphasia post-op, developed seizures; intubated 9/1-9/6.  Now free of seizures.  Hx of dysphagia/cognitive impairment after admission last year - dysphagia resolved prior to D/C .    Assessment / Plan / Recommendation Clinical Impression  Pt presents with concerns for post-extubation dysphagia with dysphonia, suggesting impaired glottal closure;  delayed cough; impaired mentation.  Pt demonstrates inattention, oral spillage on right without awareness.  There are sufficient risk factors for aspiration warranting instrumental swallow study. Recommend continuing NPO and proceeding with MBS; D/W RN, husband.  Will follow.  SLP Visit Diagnosis: Dysphagia, unspecified (R13.10)    Aspiration Risk       Diet Recommendation   NPO       Other  Recommendations Oral Care Recommendations: Oral care QID   Follow up Recommendations Other (comment)(tba)      Frequency and Duration            Prognosis        Swallow Study   General Date of Onset: 05/15/18 HPI: 47 y.o. female admitted due to recurrence of left supraclinoid aneurysm after treatment with coil embolization after rupture June 2018.  Underwent elective Surpass embolization 8/30. Mild aphasia post-op, developed seizures; intubated 9/1-9/6.  Now free of seizures.  Hx of dysphagia/cognitive impairment after admission last year - dysphagia resolved prior  to D/C .  Type of Study: Bedside Swallow Evaluation Previous Swallow Assessment: see HPI Diet Prior to this Study: NPO Temperature Spikes Noted: Yes Respiratory Status: Room air History of Recent Intubation: Yes Length of Intubations (days): 5 days Date extubated: 05/22/18 Behavior/Cognition: Alert;Cooperative Oral Care Completed by  SLP: Recent completion by staff Oral Cavity - Dentition: Adequate natural dentition Self-Feeding Abilities: Needs assist Patient Positioning: Upright in bed Baseline Vocal Quality: Low vocal intensity;Breathy Volitional Cough: Strong Volitional Swallow: Able to elicit    Oral/Motor/Sensory Function Overall Oral Motor/Sensory Function: Within functional limits   Ice Chips Ice chips: Impaired Presentation: Spoon Oral Phase Functional Implications: Right anterior spillage   Thin Liquid Thin Liquid: Impaired Presentation: Cup;Straw Oral Phase Impairments: Reduced labial seal Oral Phase Functional Implications: Right anterior spillage Pharyngeal  Phase Impairments: Suspected delayed Swallow;Multiple swallows;Throat Clearing - Delayed    Nectar Thick Nectar Thick Liquid: Not tested   Honey Thick Honey Thick Liquid: Not tested   Puree Puree: Not tested   Solid     Solid: Not tested      [name] 05/23/2018,4:21 PM

## 2018-05-23 NOTE — Progress Notes (Addendum)
TYSHANA NISHIDA  QPR:[id number] DOB: 1971-08-31 DOA: 05/15/2018 PCP: [name], MD    LOS: 8 days   Reason for Consult / Chief Complaint:  Respiratory failure due to status epilepticus  Consulting MD and date of consult:  [name] - 05/17/2018  HPI/summary of hospital stay:  The patient is a 47 year old woman who developed status epilepticus following elective coiling of a L ICA aneurysm.  Prior [facility] with coiling 6.18 with seizures at that time. Enlarging residual aneurysm on routine follow-up imaging.  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 Started on sedative infusions for burst suppression. Burst suppression maintained for 48h, but seizures recurred after propofol weaned down. Clinical seizure control once again achieved with initiation of midazolam 05/19/18. Seizures eventually resolved. And remained abated with weaning of sedative infusions on 9/5. AED therapies continued with Dilantin, keppra, trileptal, and vimpat. She was awake and following commands and was extubated 9/6.  9/7: no events over night. Sputum growing SA. Started vanc Awake and interactive.   Subjective  Feels comfortable   Assessment & Plan:   Status epilepticus following percutaneous neurovascular intervention in patient with a baseline history of seizures related to prior subarachnoid hemorrhage. Clinical seizure activity controlled on midazolam, now off. No correctable abnormality on CT.  Patient remains seizure free and awake.  Plan Cont AEDs per neuro  Acute respiratory failure secondary to inability to protect airway. -extubated 9/6 Plan Pulse ox Wean oxygen pulm hygiene PRN CXR  Fever w/ MSSA Plan Cont PRN APAP and ibuprofen Ancef X 7d  Status post prior ICA subarachnoid hemorrhage with recurrent aneurysm. Successful diversion stenting of aneurysm.  Plan Cont  antiplatelet rx   Hypokalemia Plan Replace and recheck   Best Practice / Goals of Care / Disposition.   DVT prophylaxis: Unfractionated heparin GI prophylaxis: Protonix Diet: NPO for 4 hours post extubation then RN to advance as tolerated to regular.  Mobility: initiate mobility once patient more awake. Code Status: Full code Family Communication: Family members updated at bedside.  Disposition / Summary of Today's Plan 05/23/18     Consultants: date of consult/date signed off (if applicable)/final recs  Neurology.  Procedures:  8/30 elective embolization L ICA aneurysm using Surpass embolization and diversion stent.  Significant Diagnostic Tests: n/a  Micro Data: Sputum 9/5:MSSA PNA   Antimicrobials:  Ancef 9/7>>>   Objective    Examination: General 47 year old aaf resting in bed. No distress HENT: NCAT. No JVD MMM Pulm: clear to auscultation no accessory use Card: RRR no MRG Abd: soft not tender. No OM + bowel sounds Ext: no edema brisk CR  Neuro: awake, follows commands more interactive   Blood pressure (Abnormal) 147/79, pulse (Abnormal) 112, temperature (Abnormal) 100.4 F (38 C), resp. rate 20, height 5\' 4"  (1.626 m), weight 97.8 kg, SpO2 96 %.    Vent Mode: CPAP;PSV FiO2 (%):  [30 %] 30 % PEEP:  [5 cmH20] 5 cmH20 Pressure Support:  [5 cmH20] 5 cmH20   Intake/Output Summary (Last 24 hours) at 05/23/2018 0946 Last data filed at 05/23/2018 0700 Gross per 24 hour  Intake 2142.83 ml  Output 4600 ml  Net -2457.17 ml   Filed Weights   05/21/18 0500 05/22/18 0426 05/23/18 0500  Weight: 97.3 kg 97.6 kg 97.8 kg     Labs    CBC: Recent Labs  Lab 05/19/18 0511  05/20/18 0414 05/21/18 0448 05/22/18 0428 05/23/18 0500  WBC 6.4 9.4 10.4 8.6 9.6  NEUTROABS  --  7.6 8.3* 6.2 6.4  HGB 10.5* 10.4* 10.7* 10.9* 11.8*  HCT 34.0* 32.6* 34.0* 35.2* 38.2  MCV 88.5 87.4 86.5 87.8 86.6  PLT 157 125* 133* 159 628   Basic Metabolic Panel: Recent Labs  Lab  05/18/18 1404 05/18/18 1711 05/19/18 0511 05/19/18 1454 05/20/18 0414 05/21/18 0448 05/22/18 0428 05/23/18 0500  NA  --   --  147*  --  148* 145 145 142  K  --   --  3.2*  --  2.5* 3.3* 3.8 3.4*  CL  --   --  118*  --  115* 107 110 106  CO2  --   --  25  --  27 27 27 26   GLUCOSE  --   --  91  --  193* 128* 132* 125*  BUN  --   --  9  --  12 9 7 9   CREATININE  --   --  0.98  --  0.83 0.74 0.73 0.77  CALCIUM  --   --  7.5*  --  7.5* 7.7* 8.2* 8.7*  MG 2.0 2.0 1.9 1.8  --   --   --   --   PHOS 2.4* 2.6 2.0* 1.2*  --  2.2*  --   --    GFR: Estimated Creatinine Clearance: 96.6 mL/min (by C-G formula based on SCr of 0.77 mg/dL). Recent Labs  Lab 05/20/18 0414 05/21/18 0448 05/22/18 0428 05/23/18 0500  WBC 9.4 10.4 8.6 9.6   Liver Function Tests: Recent Labs  Lab 05/18/18 0920 05/20/18 0414 05/21/18 0448 05/22/18 0428  AST  --  15 21 20   ALT  --  11 15 16   ALKPHOS  --  50 64 74  BILITOT  --  0.5 0.6 0.6  PROT  --  4.8* 5.2* 5.9*  ALBUMIN 2.9* 2.4* 2.5* 2.7*   No results for input(s): LIPASE, AMYLASE in the last 168 hours. No results for input(s): AMMONIA in the last 168 hours. ABG    Component Value Date/Time   PHART 7.421 05/17/2018 2048   PCO2ART 43.0 05/17/2018 2048   PO2ART 544.0 (H) 05/17/2018 2048   HCO3 27.9 05/17/2018 2048   TCO2 29 05/17/2018 2048   ACIDBASEDEF 10.0 (H) 03/10/2017 0339   O2SAT 100.0 05/17/2018 2048    Coagulation Profile: No results for input(s): INR, PROTIME in the last 168 hours. Cardiac Enzymes: No results for input(s): CKTOTAL, CKMB, CKMBINDEX, TROPONINI in the last 168 hours. HbA1C: Hgb A1c MFr Bld  Date/Time Value Ref Range Status  07/23/2013 01:16 PM 5.9 (H) 4.8 - 5.6 % Final    Comment:             Increased risk for diabetes: 5.7 - 6.4          Diabetes: >6.4          Glycemic control for adults with diabetes: <7.0   CBG: Recent Labs  Lab 05/22/18 1504 05/22/18 2115 05/22/18 2317 05/23/18 0324 05/23/18 0809    GLUCAP 106* 131* 105* 129* Flippin ACNP-BC [place] Pager # [phone] OR # [phone] if no answer  05/23/2018 9:46 AM  Attending Note:  47 year old female here for an elective aneurysm coiling who developed status epilepticus intraop and was intubated for airway protection.  Patient was extubated on 9/6 but remains delirious and with  aspiration PNA.  On exam, bibasilar crackles noted.  I reviewed CXR myself, infiltrate noted.  Discussed with PCCM-NP.  Hypoxemia  - Titrate O2 for sat of 88-92%  - May need an ambulatory desaturation study prior to discharge for home O2  Aspiration PNA  - Abx as ordered  - F/U on cultures  Dysphagia:  - SLP  - NPO  Hypokalemia:  - Replace  - Recheck  PCCM will continue to follow for medical management.  Patient seen and examined, agree with above note.  I dictated the care and orders written for this patient under my direction.  Rush Farmer, [place]

## 2018-05-23 NOTE — Progress Notes (Signed)
Subjective: improving.   Exam: Vitals:   05/23/18 0600 05/23/18 0700  BP: (!) 143/78 (!) 147/79  Pulse: 98 (!) 112  Resp: 17 20  Temp: (!) 100.4 F (38 C) (!) 100.4 F (38 C)  SpO2: 98% 96%   Gen: In bed, NAD Resp: non-laboured breathing.  Abd: soft, nt  Neuro: She opens her eyes, follows command sreadily, states "good morning" when I say it to her. She gives month as November,  EOMI with  nystagmus. She does blink to threat. She does not follow commands. he does not respond to noxious stimulation n  Pertinent Labs: PHT 19.5, corrects supratheraputic OXC from 9/6 pending.   Impression: 47 yo  F with recurrent refractory seizures amounting to status epilepticus which was refractory to 4 AEDs. Given the degree of deficit, the decision was made to proceed with burst suppression with propofol. She was suppressed from 7pm 9/1 until the morning of 9/3. Resumption of seizures with weaning of propofol and resolved with midazolam. Midazolam reduced to sedation dose afternoon of 9/4 and discontinued completely morning of 9/5. Sz free since 9/3.  Dilantin increasing with clinical signs of toxicity, will hold for now.   Recommendations: 1) hold dilantin today, continue daily level.  2) continue keppra 1500mg  BID 3) trileptal 900mg  BID 4) vimpat 200mg  BID.  5) OXC level pending  Roland Rack, MD [facility] [phone]  If 7pm- 7am, please page neurology on call as listed in [place]. 05/23/2018  9:22 AM

## 2018-05-23 NOTE — Progress Notes (Signed)
No new issues or problems overnight.  Patient continues to emerge from her postictal state and from her sedation.  She is afebrile.  Her vitals are stable.  She is somnolent but awakens easily.  She says a few words and will follow commands bilaterally.  She remains confused.  Motor examination appears symmetric.  Follow-up MRI scan without evidence of severe new infarct or other major structural problem.  Patient continues to slowly recover from her status epilepticus.  Continue supportive efforts.

## 2018-05-24 ENCOUNTER — Inpatient Hospital Stay (HOSPITAL_COMMUNITY): Payer: 59

## 2018-05-24 LAB — BASIC METABOLIC PANEL
ANION GAP: 11 (ref 5–15)
BUN: 15 mg/dL (ref 6–20)
CO2: 25 mmol/L (ref 22–32)
Calcium: 8.4 mg/dL — ABNORMAL LOW (ref 8.9–10.3)
Chloride: 105 mmol/L (ref 98–111)
Creatinine, Ser: 0.74 mg/dL (ref 0.44–1.00)
GFR calc Af Amer: >60 mL/min (ref >60)
GFR calc non Af Amer: >60 mL/min (ref >60)
GLUCOSE: 111 mg/dL — AB (ref 70–99)
POTASSIUM: 3.3 mmol/L — AB (ref 3.5–5.1)
SODIUM: 141 mmol/L (ref 135–145)

## 2018-05-24 LAB — CBC WITH DIFFERENTIAL/PLATELET
ABS IMMATURE GRANULOCYTES: 0.1 10*3/uL (ref 0.0–0.1)
BASOS PCT: 0 %
Basophils Absolute: 0 10*3/uL (ref 0.0–0.1)
Eosinophils Absolute: 0.2 10*3/uL (ref 0.0–0.7)
Eosinophils Relative: 2 %
HCT: 37.6 % (ref 36.0–46.0)
HEMOGLOBIN: 12.2 g/dL (ref 12.0–15.0)
IMMATURE GRANULOCYTES: 1 %
LYMPHS ABS: 2.1 10*3/uL (ref 0.7–4.0)
LYMPHS PCT: 26 %
MCH: 28 pg (ref 26.0–34.0)
MCHC: 32.4 g/dL (ref 30.0–36.0)
MCV: 86.2 fL (ref 78.0–100.0)
MONOS PCT: 11 %
Monocytes Absolute: 0.9 10*3/uL (ref 0.1–1.0)
NEUTROS ABS: 4.9 10*3/uL (ref 1.7–7.7)
NEUTROS PCT: 60 %
Platelets: 227 10*3/uL (ref 150–400)
RBC: 4.36 MIL/uL (ref 3.87–5.11)
RDW: 13.7 % (ref 11.5–15.5)
WBC: 8.1 10*3/uL (ref 4.0–10.5)

## 2018-05-24 LAB — GLUCOSE, CAPILLARY
GLUCOSE-CAPILLARY: 101 mg/dL — AB (ref 70–99)
GLUCOSE-CAPILLARY: 80 mg/dL (ref 70–99)
Glucose-Capillary: 112 mg/dL — ABNORMAL HIGH (ref 70–99)
Glucose-Capillary: 107 mg/dL — ABNORMAL HIGH (ref 70–99)
Glucose-Capillary: 137 mg/dL — ABNORMAL HIGH (ref 70–99)
Glucose-Capillary: 107 mg/dL — ABNORMAL HIGH (ref 70–99)

## 2018-05-24 LAB — PHENYTOIN LEVEL, TOTAL: Phenytoin Lvl: 15.2 ug/mL (ref 10.0–20.0)

## 2018-05-24 MED ORDER — POTASSIUM CHLORIDE 20 MEQ/15ML (10%) PO SOLN
40.0000 meq | Freq: Once | ORAL | Status: AC
  Administered 2018-05-24: 40 meq via ORAL

## 2018-05-24 MED ORDER — OXCARBAZEPINE 300 MG PO TABS
900.0000 mg | ORAL_TABLET | Freq: Two times a day (BID) | ORAL | Status: DC
Start: 2018-05-25 — End: 2018-05-28
  Administered 2018-05-24 – 2018-05-28 (×7): 900 mg via ORAL
  Filled 2018-05-24 (×7): qty 3

## 2018-05-24 MED ORDER — POTASSIUM CHLORIDE 20 MEQ/15ML (10%) PO SOLN
40.0000 meq | Freq: Two times a day (BID) | ORAL | Status: AC
  Administered 2018-05-24: 40 meq
  Filled 2018-05-24 (×3): qty 30

## 2018-05-24 MED ORDER — PRO-STAT SUGAR FREE PO LIQD
30.0000 mL | Freq: Three times a day (TID) | ORAL | Status: DC
  Administered 2018-05-24 – 2018-05-26 (×3): 30 mL via ORAL
  Filled 2018-05-24 (×3): qty 30

## 2018-05-24 NOTE — Progress Notes (Signed)
Subjective: Continues to improve  Exam: Vitals:   05/24/18 1600 05/24/18 1700  BP: (!) 151/100   Pulse: 94 91  Resp: 17 16  Temp: 100.3 F (37.9 C)   SpO2: 95% 96%   Gen: In bed, intubated Resp: Ventilated Abd: soft, nt  Neuro: MS: Awake, alert, she is able to answer questions readily with markedly improved fluency.  She still has some word substitution as well as inability to repeat. CN: Pupils equal round and reactive, visual fields full, face symmetric, she has full visual fields with marked nystagmus Motor: She has relatively symmetric strength, though not sure she is give me full strength anywhere Sensory: She endorses symmetric sensation to light touch   Pertinent Labs: PHT 15.2 OXC from 9/6 pending.   Impression: 47 yo  F with recurrent refractory seizures amounting to status epilepticus which was refractory to 4 AEDs. Given the degree of deficit, the decision was made to proceed with burst suppression with propofol. She was suppressed from 7pm 9/1 until the morning of 9/3. Resumption of seizures with weaning of propofol and resolved with midazolam. Midazolam reduced to sedation dose afternoon of 9/4 and discontinued completely morning of 9/5. Sz free since 9/3.  I suspect that much of her sedation since then has been Dilantin toxicity.  Between Dilantin, Trileptal and Vimpat she is on 3 sodium channel blockers and I think this is likely to be a set up for side effects.  At this point I would favor continuing only Trileptal and Vimpat and discontinuing Dilantin.  Trileptal is at a very large dose given that it is metabolized by Dilantin.  Once Dilantin is subtherapeutic I would decrease Trileptal to 750 mg, and then will need to be further decreased to 600 mg twice daily at a later date, could discuss with pharmacy for timing.   Recommendations: 1) discontinue dilantin  2) continue keppra 1500mg  BID 3) trileptal 900mg  BID 4) vimpat 200mg  BID.  5) OXC level  pending  Roland Rack, MD [facility] [phone]  If 7pm- 7am, please page neurology on call as listed in [place]. 05/24/2018  5:56 PM

## 2018-05-24 NOTE — Progress Notes (Signed)
Patient continues to improve.  She is brighter and more interactive.  Her face is more animated.  Her speech is more fluent.  She is moving both upper and lower extremities to command.  She recently passed her swallow evaluation.  The patient is progressing well following her post procedure stroke and subsequent status epilepticus.  We will begin to mobilize.

## 2018-05-24 NOTE — Progress Notes (Addendum)
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  Prior [facility] with coiling 6.18 with seizures at that time. Enlarging residual aneurysm on routine follow-up imaging.  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 Started on sedative infusions for burst suppression. Burst suppression maintained for 48h, but seizures recurred after propofol weaned down. Clinical seizure control once again achieved with initiation of midazolam 05/19/18. Seizures eventually resolved. And remained abated with weaning of sedative infusions on 9/5. AED therapies continued with Dilantin, keppra, trileptal, and vimpat. She was awake and following commands and was extubated 9/6.  9/7: no events over night. Sputum growing SA. Started vanc Awake and interactive.  9/8: Much more awake today.  Overnight.  Subjective  More awake.  Denies headache, feeling stronger.  Assessment & Plan:   Status epilepticus following percutaneous neurovascular intervention in patient with a baseline history of seizures related to prior subarachnoid hemorrhage. Clinical seizure activity controlled on midazolam, now off. No correctable abnormality on CT.  Patient remains seizure free and awake.  Plan Continue anticonvulsants per neurology Mobilize Advance diet as tolerated following evaluation  Acute respiratory failure secondary to inability to protect airway. -extubated 9/6 Plan Pulmonary hygiene Wean oxygen PRN chest x-ray Mobilize  Fever w/ MSSA pneumonia Plan Ancef day  #2 of 7 PRN Tylenol and ibuprofen  Status post prior ICA subarachnoid hemorrhage with recurrent aneurysm. Successful diversion stenting of aneurysm.  Plan Continue antiplatelet therapy  Hypokalemia Plan Replace and recheck  Best Practice / Goals of Care / Disposition.   DVT prophylaxis: Unfractionated heparin GI prophylaxis: Protonix Diet: NPO for 4 hours post extubation then RN to advance as tolerated to regular.  Mobility: initiate mobility once patient more awake. Code Status: Full code Family Communication: Family members updated at bedside.  Disposition / Summary of Today's Plan 05/24/18   No further seizure.  Mental status continues to improve significantly.  Now oriented and appropriate.  Strength seems to be improving.  Think we can assess her for advancing diet, mobilizing her.  Begin to work towards rehabilitation.  Deferring anticonvulsant therapy to neurology.  Otherwise it appears as though things are slowly improving.  Critical care will sign off.  Consultants: date of consult/date signed off (if applicable)/final recs  Neurology.  Procedures:  8/30 elective embolization L ICA aneurysm using Surpass embolization and diversion stent.  Significant Diagnostic Tests: n/a  Micro Data: Sputum 9/5:MSSA PNA   Antimicrobials:  Ancef 9/7>>>   Objective    Examination: General: This 47 year old female patient currently resting in bed.  She is much more awake and interactive today than when comparing to exam on 9/7 HEENT normocephalic atraumatic no jugular venous distention.  Nasogastric tube is in place.  Mucous membranes moist. Pulmonary: Clear to auscultation without accessory use currently on room air Abdomen: Soft nontender no organomegaly Cardiac: Regular rate and rhythm without murmur rub or gallop Extremities: Brisk capillary refill no edema strong pulses warm to palpation Neuro: Awake, now oriented, more appropriate, moves all extremities.  Blood pressure  (  Abnormal) 149/105, pulse 93, temperature 99.7 F (37.6 C), resp. rate 14, height 5\' 4"  (1.626 m), weight 97.5 kg, SpO2 98 %.        Intake/Output Summary (Last 24 hours) at 05/24/2018 0927 Last data filed at 05/24/2018 0915 Gross per 24 hour  Intake 2779.31 ml  Output 2750 ml  Net 29.31 ml   Filed Weights   05/22/18 0426 05/23/18 0500 05/24/18 0420  Weight: 97.6 kg 97.8 kg 97.5 kg     Labs    CBC: Recent Labs  Lab 05/20/18 0414 05/21/18 0448 05/22/18 0428 05/23/18 0500 05/24/18 0435  WBC 9.4 10.4 8.6 9.6 8.1  NEUTROABS 7.6 8.3* 6.2 6.4 4.9  HGB 10.4* 10.7* 10.9* 11.8* 12.2  HCT 32.6* 34.0* 35.2* 38.2 37.6  MCV 87.4 86.5 87.8 86.6 86.2  PLT 125* 133* 159 223 573   Basic Metabolic Panel: Recent Labs  Lab 05/18/18 1404 05/18/18 1711 05/19/18 0511 05/19/18 1454 05/20/18 0414 05/21/18 0448 05/22/18 0428 05/23/18 0500 05/24/18 0435  NA  --   --  147*  --  148* 145 145 142 141  K  --   --  3.2*  --  2.5* 3.3* 3.8 3.4* 3.3*  CL  --   --  118*  --  115* 107 110 106 105  CO2  --   --  25  --  27 27 27 26 25   GLUCOSE  --   --  91  --  193* 128* 132* 125* 111*  BUN  --   --  9  --  12 9 7 9 15   CREATININE  --   --  0.98  --  0.83 0.74 0.73 0.77 0.74  CALCIUM  --   --  7.5*  --  7.5* 7.7* 8.2* 8.7* 8.4*  MG 2.0 2.0 1.9 1.8  --   --   --   --   --   PHOS 2.4* 2.6 2.0* 1.2*  --  2.2*  --   --   --    GFR: Estimated Creatinine Clearance: 96.4 mL/min (by C-G formula based on SCr of 0.74 mg/dL). Recent Labs  Lab 05/21/18 0448 05/22/18 0428 05/23/18 0500 05/24/18 0435  WBC 10.4 8.6 9.6 8.1   Liver Function Tests: Recent Labs  Lab 05/18/18 0920 05/20/18 0414 05/21/18 0448 05/22/18 0428  AST  --  15 21 20   ALT  --  11 15 16   ALKPHOS  --  50 64 74  BILITOT  --  0.5 0.6 0.6  PROT  --  4.8* 5.2* 5.9*  ALBUMIN 2.9* 2.4* 2.5* 2.7*   No results for input(s): LIPASE, AMYLASE in the last 168 hours. No results for input(s): AMMONIA in the last 168 hours. ABG      Component Value Date/Time   PHART 7.421 05/17/2018 2048   PCO2ART 43.0 05/17/2018 2048   PO2ART 544.0 (H) 05/17/2018 2048   HCO3 27.9 05/17/2018 2048   TCO2 29 05/17/2018 2048   ACIDBASEDEF 10.0 (H) 03/10/2017 0339   O2SAT 100.0 05/17/2018 2048    Coagulation Profile: No results for input(s): INR, PROTIME in the last 168 hours. Cardiac Enzymes: No results for input(s): CKTOTAL, CKMB, CKMBINDEX, TROPONINI in the last 168 hours. HbA1C: Hgb A1c MFr Bld  Date/Time Value Ref Range Status  07/23/2013 01:16 PM 5.9 (H) 4.8 - 5.6 % Final    Comment:             Increased risk for diabetes: 5.7 - 6.4  Diabetes: >6.4          Glycemic control for adults with diabetes: <7.0   CBG: Recent Labs  Lab 05/23/18 1507 05/23/18 1925 05/23/18 2325 05/24/18 0317 05/24/18 0757  GLUCAP 119* 134* 141* 112* 107*    Erick Colace ACNP-BC [place] Pager # [phone] OR # [phone] if no answer  Attending Note:  47 year old female with status post coiling of an aneurysm who presents to PCCM for vent management.  Patient was extubated on 9/6 and PCCM is following for medical management.  On exam, lungs are clear.  I reviewed CXR myself, no acute disease noted.  Discussed with PCCM-NP.  Hypoxemia:  - Titrate O2 for sat of 88-92%  - May need an ambulatory desaturation study prior to discharge for home O2  Aspiration pneumonia:  - Continue abx  - Awaiting finalized cultures  Dysphagia: seen by SLP  - Dysphagia 3 diet  - Monitor closely for aspiration  Hypokalemia:  - KCl PO  - BMET in AM  PCCM will continue to follow  Patient seen and examined, agree with above note.  I dictated the care and orders written for this patient under my direction.  Rush Farmer, [place]

## 2018-05-24 NOTE — Progress Notes (Signed)
Modified Barium Swallow Progress Note  Patient Details  Name: Carly [name] MRN: [id number] Date of Birth: 1970/12/10  Today's Date: 05/24/2018  Modified Barium Swallow completed.  Full report located under Chart Review in the Imaging Section.  Brief recommendations include the following:  Clinical Impression  Patient presents with a mild oropharyngeal dysphagia. Intermittent oral delays noted, likely related to lethargy. Even regular solids however masticated efficiently and cleared fully without cueing. Airway protected across consistencies with only one episode of trace transient penetration of thin liquids, clearing with subsequent swallows. Noted trace pharyngeal coating throughout study and pill remaining in vallecula post swallow initially, both thought to be relatd to the presence of her NG tube. Recommend removal of NG tube prior to beginning pos. Recommend initiation of dysphagia 3 diet (given fluctuating level of alertness), thin liquid with full supervision initially. SLP will f/u for tolerance and upgrade.    Swallow Evaluation Recommendations       SLP Diet Recommendations: Dysphagia 3 (Mech soft) solids;Thin liquid   Liquid Administration via: Cup;Straw   Medication Administration: Crushed with puree   Supervision: Patient able to self feed;Full supervision/cueing for compensatory strategies;Staff to assist with self feeding   Compensations: Slow rate;Small sips/bites   Postural Changes: Seated upright at 90 degrees   Oral Care Recommendations: Oral care BID       Gabriel Rainwater MA, CCC-SLP   Alaia Lordi Meryl 05/24/2018,11:09 AM

## 2018-05-25 LAB — CBC WITH DIFFERENTIAL/PLATELET
ABS IMMATURE GRANULOCYTES: 0.1 10*3/uL (ref 0.0–0.1)
Basophils Absolute: 0 10*3/uL (ref 0.0–0.1)
Basophils Relative: 0 %
Eosinophils Absolute: 0.2 10*3/uL (ref 0.0–0.7)
Eosinophils Relative: 2 %
HCT: 34 % — ABNORMAL LOW (ref 36.0–46.0)
HEMOGLOBIN: 10.7 g/dL — AB (ref 12.0–15.0)
Immature Granulocytes: 1 %
LYMPHS PCT: 25 %
Lymphs Abs: 1.9 10*3/uL (ref 0.7–4.0)
MCH: 27.2 pg (ref 26.0–34.0)
MCHC: 31.5 g/dL (ref 30.0–36.0)
MCV: 86.5 fL (ref 78.0–100.0)
MONO ABS: 0.8 10*3/uL (ref 0.1–1.0)
Monocytes Relative: 11 %
NEUTROS ABS: 4.4 10*3/uL (ref 1.7–7.7)
Neutrophils Relative %: 61 %
Platelets: 232 10*3/uL (ref 150–400)
RBC: 3.93 MIL/uL (ref 3.87–5.11)
RDW: 13.6 % (ref 11.5–15.5)
WBC: 7.4 10*3/uL (ref 4.0–10.5)

## 2018-05-25 LAB — GLUCOSE, CAPILLARY
GLUCOSE-CAPILLARY: 103 mg/dL — AB (ref 70–99)
GLUCOSE-CAPILLARY: 97 mg/dL (ref 70–99)
Glucose-Capillary: 111 mg/dL — ABNORMAL HIGH (ref 70–99)
Glucose-Capillary: 109 mg/dL — ABNORMAL HIGH (ref 70–99)
Glucose-Capillary: 85 mg/dL (ref 70–99)
Glucose-Capillary: 140 mg/dL — ABNORMAL HIGH (ref 70–99)

## 2018-05-25 LAB — 10-HYDROXYCARBAZEPINE: Triliptal/MTB(Oxcarbazepin): 12 ug/mL (ref 10–35)

## 2018-05-25 MED ORDER — CLOPIDOGREL BISULFATE 75 MG PO TABS
75.0000 mg | ORAL_TABLET | Freq: Every day | ORAL | Status: DC
  Administered 2018-05-25 – 2018-05-28 (×4): 75 mg via ORAL
  Filled 2018-05-25 (×5): qty 1

## 2018-05-25 MED ORDER — HYDROCODONE-ACETAMINOPHEN 5-325 MG PO TABS: 1.0000 | ORAL_TABLET | ORAL | Status: DC | PRN

## 2018-05-25 MED ORDER — ONDANSETRON HCL 4 MG PO TABS: 4.0000 mg | ORAL_TABLET | ORAL | Status: DC | PRN

## 2018-05-25 MED ORDER — IBUPROFEN 200 MG PO TABS: 400.0000 mg | ORAL_TABLET | Freq: Four times a day (QID) | ORAL | Status: DC | PRN

## 2018-05-25 MED ORDER — ACETAMINOPHEN 325 MG PO TABS
650.0000 mg | ORAL_TABLET | Freq: Four times a day (QID) | ORAL | Status: DC | PRN
  Administered 2018-05-26: 650 mg via ORAL
  Filled 2018-05-25: qty 2

## 2018-05-25 MED ORDER — PANTOPRAZOLE SODIUM 40 MG PO TBEC
40.0000 mg | DELAYED_RELEASE_TABLET | Freq: Every day | ORAL | Status: DC
  Administered 2018-05-25 – 2018-05-28 (×4): 40 mg via ORAL
  Filled 2018-05-25 (×5): qty 1

## 2018-05-25 MED ORDER — ASPIRIN 325 MG PO TABS
325.0000 mg | ORAL_TABLET | Freq: Every day | ORAL | Status: DC
  Administered 2018-05-25 – 2018-05-28 (×4): 325 mg via ORAL
  Filled 2018-05-25 (×5): qty 1

## 2018-05-25 MED ORDER — ONDANSETRON HCL 4 MG/2ML IJ SOLN: 4.0000 mg | INTRAMUSCULAR | Status: DC | PRN

## 2018-05-25 MED ORDER — ADULT MULTIVITAMIN W/MINERALS CH
1.0000 | ORAL_TABLET | Freq: Every day | ORAL | Status: DC
  Administered 2018-05-25 – 2018-05-28 (×4): 1 via ORAL
  Filled 2018-05-25 (×5): qty 1

## 2018-05-25 NOTE — Progress Notes (Signed)
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 Sz 9/1. 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  pt sedated for burst suppression for 48 hours,  Seizure control achieved on 9/3. and sedation weaned 9/5.  Pt extubated 9/6.  PMhx: SAH s/p aneurysm coiling 02/2017, adjustment d/o, HTN, GERD.    PT Comments    Much improved.  Following commands with some mild delay.  L side uncoordinated, but if slowed, pt can use the L side functionally to assist mobility.  Emphasis on bed mobility, scooting, sitting balance (donning socks), sit to stand and progressive gait.    Follow Up Recommendations  CIR;Supervision/Assistance - 24 hour     Equipment Recommendations  Other (comment)(TBA)    Recommendations for Other Services Rehab consult     Precautions / Restrictions Precautions Precautions: Fall    Mobility  Bed Mobility Overal bed mobility: Needs Assistance Bed Mobility: Supine to Sit     Supine to sit: Min assist     General bed mobility comments: increased time to scoot over to EOB and come up via R elbow  Transfers Overall transfer level: Needs assistance   Transfers: Sit to/from Stand Sit to Stand: Min assist         General transfer comment: cues for hand placement, steady assist  Ambulation/Gait Ambulation/Gait assistance: Min assist Gait Distance (Feet): 80 Feet Assistive device: IV Pole Gait Pattern/deviations: Step-through pattern   Gait velocity interpretation: <1.31 ft/sec, indicative of household ambulator General Gait Details: short guarded steps with L LE showing mild incoordination.   Stairs             Wheelchair Mobility    Modified Rankin (Stroke Patients Only) Modified Rankin (Stroke Patients  Only) Pre-Morbid Rankin Score: No symptoms Modified Rankin: Moderately severe disability(toward 3)     Balance Overall balance assessment: Needs assistance   Sitting balance-Leahy Scale: Fair Sitting balance - Comments: difficulty maintaining balance donning socks at EOB     Standing balance-Leahy Scale: Fair Standing balance comment: static only, otherwise needs minimal external support                            Cognition Arousal/Alertness: Awake/alert Behavior During Therapy: WFL for tasks assessed/performed Overall Cognitive Status: Impaired/Different from baseline Area of Impairment: Problem solving                             Problem Solving: Slow processing General Comments: mild delay at times      Exercises      General Comments General comments (skin integrity, edema, etc.): EHR up to 143 otherwise VSS      Pertinent Vitals/Pain Pain Assessment: Faces Faces Pain Scale: No hurt    Home Living                      Prior Function            PT Goals (current goals can now be found in the care plan section) Acute Rehab PT Goals Patient Stated Goal: able to do myself PT Goal Formulation: With patient Time For Goal Achievement: 06/05/18 Potential to Achieve Goals: Good Progress towards PT goals: Progressing toward goals    Frequency  Min 3X/week      PT Plan Discharge plan needs to be updated    Co-evaluation              AM-PAC PT "6 Clicks" Daily Activity  Outcome Measure  Difficulty turning over in bed (including adjusting bedclothes, sheets and blankets)?: Unable Difficulty moving from lying on back to sitting on the side of the bed? : Unable Difficulty sitting down on and standing up from a chair with arms (e.g., wheelchair, bedside commode, etc,.)?: Unable Help needed moving to and from a bed to chair (including a wheelchair)?: A Little Help needed walking in hospital room?: A Little Help needed  climbing 3-5 steps with a railing? : A Little 6 Click Score: 12    End of Session   Activity Tolerance: Patient tolerated treatment well Patient left: in chair;with call bell/phone within reach;with family/visitor present Nurse Communication: Mobility status PT Visit Diagnosis: Other abnormalities of gait and mobility (R26.89);Muscle weakness (generalized) (M62.81);Other symptoms and signs involving the nervous system (R29.898)     Time: 6387-5643 PT Time Calculation (min) (ACUTE ONLY): 26 min  Charges:  $Gait Training: 8-22 mins $Therapeutic Activity: 8-22 mins                     05/25/2018  Donnella Sham, [place] [phone]  (pager) [phone]  (office)   Tessie Fass Naeem Quillin 05/25/2018, 12:53 PM

## 2018-05-25 NOTE — Progress Notes (Signed)
  NEUROSURGERY PROGRESS NOTE   Pt seen and examined. No issues overnight. No real complaints this am.  EXAM: Temp:  [99.1 F (37.3 C)-100.4 F (38 C)] 99.1 F (37.3 C) (09/09 0800) Pulse Rate:  [81-97] 95 (09/09 1000) Resp:  [14-22] 18 (09/09 1000) BP: (92-163)/(65-103) 129/90 (09/09 1000) SpO2:  [92 %-100 %] 96 % (09/09 1000) Weight:  [92.1 kg] 92.1 kg (09/09 0500) Intake/Output      09/08 0701 - 09/09 0700 09/09 0701 - 09/10 0700   P.O. 240    I.V. (mL/kg) 454.8 (4.9) 10 (0.1)   NG/GT     IV Piggyback 690    Total Intake(mL/kg) 1384.8 (15) 10 (0.1)   Urine (mL/kg/hr) 1500 (0.7)    Stool 0    Total Output 1500    Net -115.2 +10        Urine Occurrence 1 x    Stool Occurrence 1 x     Awake, alert, oriented Speech fluent, naming and repetition intact Good strength throughout  LABS: Lab Results  Component Value Date   CREATININE 0.74 05/24/2018   BUN 15 05/24/2018   NA 141 05/24/2018   K 3.3 (L) 05/24/2018   CL 105 05/24/2018   CO2 25 05/24/2018   Lab Results  Component Value Date   WBC 7.4 05/25/2018   HGB 10.7 (L) 05/25/2018   HCT 34.0 (L) 05/25/2018   MCV 86.5 05/25/2018   PLT 232 05/25/2018    IMPRESSION: - 47 y.o. female s/p embolizaiton LICA aneurysm, postop status now controlled. In comparison to immediate postop, her speech is actually improved suggesting her postop deficits might be mostly related to the SZ.  PLAN: - Cont observation - Mobilize with PT/OT as tolerated - Cont SLP

## 2018-05-26 ENCOUNTER — Encounter (HOSPITAL_COMMUNITY): Payer: Self-pay | Admitting: Neurosurgery

## 2018-05-26 DIAGNOSIS — D62 Acute posthemorrhagic anemia: Secondary | ICD-10-CM

## 2018-05-26 DIAGNOSIS — I671 Cerebral aneurysm, nonruptured: Principal | ICD-10-CM

## 2018-05-26 DIAGNOSIS — Z8679 Personal history of other diseases of the circulatory system: Secondary | ICD-10-CM

## 2018-05-26 LAB — GLUCOSE, CAPILLARY
GLUCOSE-CAPILLARY: 85 mg/dL (ref 70–99)
GLUCOSE-CAPILLARY: 89 mg/dL (ref 70–99)
Glucose-Capillary: 84 mg/dL (ref 70–99)
Glucose-Capillary: 86 mg/dL (ref 70–99)
Glucose-Capillary: 99 mg/dL (ref 70–99)

## 2018-05-26 LAB — CBC WITH DIFFERENTIAL/PLATELET
ABS IMMATURE GRANULOCYTES: 0.1 10*3/uL (ref 0.0–0.1)
BASOS PCT: 1 %
Basophils Absolute: 0 10*3/uL (ref 0.0–0.1)
EOS ABS: 0.2 10*3/uL (ref 0.0–0.7)
Eosinophils Relative: 3 %
HEMATOCRIT: 35.4 % — AB (ref 36.0–46.0)
Hemoglobin: 11.3 g/dL — ABNORMAL LOW (ref 12.0–15.0)
IMMATURE GRANULOCYTES: 1 %
Lymphocytes Relative: 28 %
Lymphs Abs: 1.9 10*3/uL (ref 0.7–4.0)
MCH: 27.4 pg (ref 26.0–34.0)
MCHC: 31.9 g/dL (ref 30.0–36.0)
MCV: 85.7 fL (ref 78.0–100.0)
MONOS PCT: 14 %
Monocytes Absolute: 1 10*3/uL (ref 0.1–1.0)
NEUTROS PCT: 53 %
Neutro Abs: 3.6 10*3/uL (ref 1.7–7.7)
Platelets: 242 10*3/uL (ref 150–400)
RBC: 4.13 MIL/uL (ref 3.87–5.11)
RDW: 13.8 % (ref 11.5–15.5)
WBC: 6.8 10*3/uL (ref 4.0–10.5)

## 2018-05-26 LAB — PHENYTOIN LEVEL, TOTAL: Phenytoin Lvl: 9.3 ug/mL — ABNORMAL LOW (ref 10.0–20.0)

## 2018-05-26 MED ORDER — LEVETIRACETAM 750 MG PO TABS
1500.0000 mg | ORAL_TABLET | Freq: Two times a day (BID) | ORAL | Status: DC
  Administered 2018-05-26 – 2018-05-28 (×5): 1500 mg via ORAL
  Filled 2018-05-26 (×5): qty 2

## 2018-05-26 MED ORDER — ENSURE ENLIVE PO LIQD
237.0000 mL | Freq: Two times a day (BID) | ORAL | Status: DC
  Administered 2018-05-26 – 2018-05-27 (×4): 237 mL via ORAL

## 2018-05-26 MED ORDER — LACOSAMIDE 200 MG PO TABS
200.0000 mg | ORAL_TABLET | Freq: Two times a day (BID) | ORAL | Status: DC
  Administered 2018-05-26 – 2018-05-28 (×4): 200 mg via ORAL
  Filled 2018-05-26 (×4): qty 1

## 2018-05-26 NOTE — Progress Notes (Signed)
  NEUROSURGERY PROGRESS NOTE   No issues overnight. No real complaints this am. Sitting in bedside chair. Able to ambulate yesterday with PT in the hallway.  EXAM:  BP (!) 109/93   Pulse 90   Temp 99 F (37.2 C) (Oral)   Resp 19   Ht 5\' 4"  (1.626 m)   Wt 92 kg   SpO2 97%   BMI 34.81 kg/m   Awake, alert, oriented  Speech fluent, appropriate  CN grossly intact  Good strength  IMPRESSION:  47 y.o. female s/p aneurysm embolization, postop status. Now on 4 AEDs, appears to be doing well. Deconditioned, but appears to be at neurologic baseline.  PLAN: - Transfer to 4N progressive care - Cont to mobilize - AED mgmt per neurology - Stable to transfer to CIR when available.

## 2018-05-26 NOTE — Progress Notes (Signed)
Inpatient Rehabilitation-Admissions Coordinator    Met with pt and her husband at the bedside as follow up from PM&R consult. Discussed current functional progress and anticipation that pt will quickly progress but that we will reassess once pt has had additional therapy sessions tomorrow (Wednesday). AC explained that pt may not require CIR if she continues to progress at such a quick rate. Both pt and her husband expressed interest in pursuing CIR if felt she still had need.   AC will follow up with pt after therapy sessions tomorrow to determine if need for CIR remains.   Please call if questions.   Jhonnie Garner, OTR/L  Rehab Admissions Coordinator  [phone] 05/26/2018 5:31 PM

## 2018-05-26 NOTE — Consult Note (Signed)
Physical Medicine and Rehabilitation Consult Reason for Consult: Aphasia and right-sided neglect with hemiparesis Referring Physician: Dr. Kathyrn Sheriff   HPI: Carly [name] is a 47 y.o. 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  She underwent recent follow-up diagnostic cerebral angiogram that showed enlargement of residual aneurysm.  History taken from chart review and patient. Patient lives with spouse and 12 year old daughter in [place].  Independent prior to admission.  One level home with 3 steps to entry.  Presented 05/15/2018 and underwent embolization of residual aneurysm 05/15/2018 per Dr. Kathyrn Sheriff.  Postoperative seizure right side weakness and neglect.  Patient received an additional 500 mg of Keppra as she was already on 1000 mg twice daily.  Initial follow-up cranial CT scan reviewed, unremarkable for acute intracranial process. EEG showed at least 4 left hemispheric subclinical seizures recorded.  MRI of the brain showed small region of cortical T2 flair hyperintensity centered within the left posterior temporal lobe with a few areas of reduced diffusion.  Findings probably represented seizure related activity.  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  Neurology follow-up.  Plavix and aspirin later initiated 05/26/2018 for CVA prophylaxis.  Subcutaneous heparin for DVT prophylaxis has been initiated.  Mechanical soft diet with thin liquids.  Patient currently continues with Vimpat, Keppra as well as Trileptal for seizure disorder.  Therapy evaluations completed with recommendations of physical medicine rehab consult.  Review of Systems  Constitutional: Negative for chills and fever.  HENT: Negative for hearing loss.   Eyes:  Negative for blurred vision and double vision.  Respiratory: Negative for cough and shortness of breath.   Cardiovascular: Negative for chest pain, palpitations and leg swelling.  Gastrointestinal: Positive for nausea and vomiting.       GERD  Genitourinary: Negative for dysuria, flank pain and hematuria.  Musculoskeletal: Positive for myalgias.  Skin: Negative for rash.  Neurological: Positive for seizures and headaches.  Psychiatric/Behavioral: Positive for memory loss.  All other systems reviewed and are negative.  Past Medical History:  Diagnosis Date  . GERD (gastroesophageal reflux disease)   . Hypertension   . Memory difficulties    short term memory  . Migraines   . Seizures ([place])    with hemorrhage  . Subarachnoid hemorrhage ([facility])    d/t burst aneurysm, had seizures   Past Surgical History:  Procedure Laterality Date  . CESAREAN SECTION    . ESOPHAGOGASTRODUODENOSCOPY N/A 04/01/2017   Procedure: ESOPHAGOGASTRODUODENOSCOPY (EGD);  Surgeon: Georganna Skeans, MD;  Location: [facility];  Service: General;  Laterality: N/A;  . IR ANGIO INTRA EXTRACRAN SEL INTERNAL CAROTID UNI R MOD SED  03/03/2017  . IR ANGIO VERTEBRAL SEL SUBCLAVIAN INNOMINATE BILAT MOD SED  02/10/2018  . IR ANGIO VERTEBRAL SEL VERTEBRAL BILAT MOD SED  03/03/2017  . IR ANGIO VERTEBRAL SEL VERTEBRAL BILAT MOD SED  02/10/2018  . IR ANGIOGRAM FOLLOW UP STUDY  03/03/2017  . IR ANGIOGRAM FOLLOW UP STUDY  03/03/2017  . IR ANGIOGRAM FOLLOW UP STUDY  03/03/2017  . IR ANGIOGRAM FOLLOW UP STUDY  03/03/2017  . IR REPLC GASTRO/COLONIC TUBE PERCUT W/FLUORO  04/15/2017  . IR TRANSCATH/EMBOLIZ  03/03/2017  . LAPAROSCOPY     in early 90s  . PEG PLACEMENT N/A 04/01/2017   Procedure: PERCUTANEOUS ENDOSCOPIC GASTROSTOMY (PEG) PLACEMENT;  Surgeon:  Georganna Skeans, MD;  Location: [facility];  Service: General;  Laterality: N/A;  . RADIOLOGY WITH ANESTHESIA N/A 03/03/2017   Procedure: RADIOLOGY WITH ANESTHESIA;  Surgeon: Consuella Lose, MD;  Location: [facility];  Service: Radiology;  Laterality: N/A;  . RADIOLOGY WITH ANESTHESIA N/A 05/15/2018   Procedure: Arteriogram, Embolization of recurrent aneurysm;  Surgeon: Consuella Lose, MD;  Location: [facility];  Service: Radiology;  Laterality: N/A;   Family History  Problem Relation Age of Onset  . Diabetes Mother   . Hypertension Mother   . Hyperlipidemia Mother   . Cancer Father        colon cancer  . Diabetes Maternal Grandmother   . Hyperlipidemia Paternal Grandfather    Social History:  reports that she has never smoked. She has never used smokeless tobacco. She reports that she drank alcohol. She reports that she does not use drugs. Allergies: No Known Allergies Medications Prior to Admission  Medication Sig Dispense Refill  . acetaminophen (TYLENOL) 500 MG tablet Take 1,000 mg by mouth every 6 (six) hours as needed (for pain.).    Marland Kitchen aspirin 325 MG EC tablet Take 325 mg by mouth daily.  2  . clopidogrel (PLAVIX) 75 MG tablet Take 75 mg by mouth daily.  2  . levETIRAcetam (KEPPRA) 1000 MG tablet Take 1 tablet (1,000 mg total) by mouth 2 (two) times daily. 60 tablet 0  . metoprolol tartrate (LOPRESSOR) 50 MG tablet Take 50 mg by mouth 2 (two) times daily.    . Multiple Vitamin (MULTIVITAMIN WITH MINERALS) TABS tablet Take 1 tablet by mouth daily. (Patient taking differently: Take 1 tablet by mouth daily. Women's Ultra GNC Multivitamin) 30 tablet 0    Home: Home Living Family/patient expects to be discharged to:: Private residence Living Arrangements: Spouse/significant other, Children Additional Comments: no family present, Pt unable to answer details  Functional History: Prior Function Comments: no family available to ascertain PLOF and pt unable to relate Functional Status:  Mobility: Bed Mobility Overal bed mobility: Needs Assistance Bed Mobility: Supine to Sit Supine to sit: Min assist Sit to supine: Total assist, +2 for physical assistance General  bed mobility comments: increased time to scoot over to EOB and come up via R elbow Transfers Overall transfer level: Needs assistance Transfers: Sit to/from Stand Sit to Stand: Min assist General transfer comment: cues for hand placement, steady assist Ambulation/Gait Ambulation/Gait assistance: Min assist Gait Distance (Feet): 80 Feet Assistive device: IV Pole Gait Pattern/deviations: Step-through pattern General Gait Details: short guarded steps with L LE showing mild incoordination. Gait velocity interpretation: <1.31 ft/sec, indicative of household ambulator    ADL: ADL Overall ADL's : Needs assistance/impaired General ADL Comments: Pt is currently total A for all ADL  Cognition: Cognition Overall Cognitive Status: Impaired/Different from baseline Orientation Level: Oriented to person, Oriented to place, Oriented to situation, Disoriented to time Cognition Arousal/Alertness: Awake/alert Behavior During Therapy: WFL for tasks assessed/performed Overall Cognitive Status: Impaired/Different from baseline Area of Impairment: Problem solving Problem Solving: Slow processing General Comments: mild delay at times  Blood pressure 129/80, pulse 88, temperature 99 F (37.2 C), temperature source Oral, resp. rate 17, height 5\' 4"  (1.626 m), weight 92 kg, SpO2 97 %. Physical Exam  Vitals reviewed. Constitutional: She appears well-developed and well-nourished.  46 year old right-handed female sitting up in chair  HENT:  Head: Normocephalic and atraumatic.  Eyes: EOM are normal. Right eye exhibits no discharge. Left eye exhibits no discharge.  Neck: Normal range of motion. Neck supple. No  thyromegaly present.  Cardiovascular: Normal rate, regular rhythm and normal heart sounds.  Respiratory: Effort normal and breath sounds normal. No respiratory distress.  GI: Soft. Bowel sounds are normal. She exhibits no distension.  Musculoskeletal:  LE edema  Neurological: She is alert.  She  answers basic questions in regards to age, date of birth, place as well as follows simple commands Motor: Bilateral upper extremities: 4/5 proximal distal Bilateral lower extremity: Hip flexion 4 -/5, knee extension 4/5, ankle dorsiflexion 4+/5 Sensation intact light touch  Skin: Skin is warm and dry.  Psychiatric: Her speech is normal. She is slowed.    Results for orders placed or performed during the hospital encounter of 05/15/18 (from the past 24 hour(s))  Glucose, capillary     Status: None   Collection Time: 05/25/18 12:40 PM  Result Value Ref Range   Glucose-Capillary 97 70 - 99 mg/dL   Comment 1 Notify RN    Comment 2 Document in Chart   Glucose, capillary     Status: Abnormal   Collection Time: 05/25/18  3:17 PM  Result Value Ref Range   Glucose-Capillary 109 (H) 70 - 99 mg/dL   Comment 1 Notify RN    Comment 2 Document in Chart   Glucose, capillary     Status: None   Collection Time: 05/25/18  7:50 PM  Result Value Ref Range   Glucose-Capillary 85 70 - 99 mg/dL  Glucose, capillary     Status: Abnormal   Collection Time: 05/25/18 11:19 PM  Result Value Ref Range   Glucose-Capillary 140 (H) 70 - 99 mg/dL  Glucose, capillary     Status: None   Collection Time: 05/26/18  3:29 AM  Result Value Ref Range   Glucose-Capillary 85 70 - 99 mg/dL  CBC with Differential/Platelet     Status: Abnormal   Collection Time: 05/26/18  6:13 AM  Result Value Ref Range   WBC 6.8 4.0 - 10.5 K/uL   RBC 4.13 3.87 - 5.11 MIL/uL   Hemoglobin 11.3 (L) 12.0 - 15.0 g/dL   HCT 35.4 (L) 36.0 - 46.0 %   MCV 85.7 78.0 - 100.0 fL   MCH 27.4 26.0 - 34.0 pg   MCHC 31.9 30.0 - 36.0 g/dL   RDW 13.8 11.5 - 15.5 %   Platelets 242 150 - 400 K/uL   Neutrophils Relative % 53 %   Neutro Abs 3.6 1.7 - 7.7 K/uL   Lymphocytes Relative 28 %   Lymphs Abs 1.9 0.7 - 4.0 K/uL   Monocytes Relative 14 %   Monocytes Absolute 1.0 0.1 - 1.0 K/uL   Eosinophils Relative 3 %   Eosinophils Absolute 0.2 0.0 - 0.7  K/uL   Basophils Relative 1 %   Basophils Absolute 0.0 0.0 - 0.1 K/uL   Immature Granulocytes 1 %   Abs Immature Granulocytes 0.1 0.0 - 0.1 K/uL  Phenytoin level, total     Status: Abnormal   Collection Time: 05/26/18  6:13 AM  Result Value Ref Range   Phenytoin Lvl 9.3 (L) 10.0 - 20.0 ug/mL  Glucose, capillary     Status: None   Collection Time: 05/26/18  7:58 AM  Result Value Ref Range   Glucose-Capillary 89 70 - 99 mg/dL   Comment 1 Notify RN    Comment 2 Document in Chart    Dg Swallowing Func-speech Pathology  Result Date: 05/24/2018 Objective Swallowing Evaluation: Type of Study: MBS-Modified Barium Swallow Study  Patient Details Name: YURY SCHAUS MRN:  [id number] Date of Birth: [birth year] Today's Date: 05/24/2018 Time: SLP Start Time (ACUTE ONLY): 1030 -SLP Stop Time (ACUTE ONLY): 1055 SLP Time Calculation (min) (ACUTE ONLY): 25 min Past Medical History: Past Medical History: Diagnosis Date . GERD (gastroesophageal reflux disease)  . Hypertension  . Memory difficulties   short term memory . Migraines  . Seizures ([place])   with hemorrhage . Subarachnoid hemorrhage ([facility])   d/t burst aneurysm, had seizures Past Surgical History: Past Surgical History: Procedure Laterality Date . CESAREAN SECTION   . ESOPHAGOGASTRODUODENOSCOPY N/A 04/01/2017  Procedure: ESOPHAGOGASTRODUODENOSCOPY (EGD);  Surgeon: Georganna Skeans, MD;  Location: [facility];  Service: General;  Laterality: N/A; . IR ANGIO INTRA EXTRACRAN SEL INTERNAL CAROTID UNI R MOD SED  03/03/2017 . IR ANGIO VERTEBRAL SEL SUBCLAVIAN INNOMINATE BILAT MOD SED  02/10/2018 . IR ANGIO VERTEBRAL SEL VERTEBRAL BILAT MOD SED  03/03/2017 . IR ANGIO VERTEBRAL SEL VERTEBRAL BILAT MOD SED  02/10/2018 . IR ANGIOGRAM FOLLOW UP STUDY  03/03/2017 . IR ANGIOGRAM FOLLOW UP STUDY  03/03/2017 . IR ANGIOGRAM FOLLOW UP STUDY  03/03/2017 . IR ANGIOGRAM FOLLOW UP STUDY  03/03/2017 . IR REPLC GASTRO/COLONIC TUBE PERCUT W/FLUORO  04/15/2017 . IR TRANSCATH/EMBOLIZ  03/03/2017 .  LAPAROSCOPY    in early 90s . PEG PLACEMENT N/A 04/01/2017  Procedure: PERCUTANEOUS ENDOSCOPIC GASTROSTOMY (PEG) PLACEMENT;  Surgeon: Georganna Skeans, MD;  Location: [facility];  Service: General;  Laterality: N/A; . RADIOLOGY WITH ANESTHESIA N/A 03/03/2017  Procedure: RADIOLOGY WITH ANESTHESIA;  Surgeon: Consuella Lose, MD;  Location: [facility];  Service: Radiology;  Laterality: N/A; . RADIOLOGY WITH ANESTHESIA N/A 05/15/2018  Procedure: Arteriogram, Embolization of recurrent aneurysm;  Surgeon: Consuella Lose, MD;  Location: [facility];  Service: Radiology;  Laterality: N/A; HPI: 48 y.o. female admitted due to recurrence of left supraclinoid aneurysm after treatment with coil embolization after rupture June 2018.  Underwent elective Surpass embolization 8/30. Mild aphasia post-op, developed seizures; intubated 9/1-9/6.  Now free of seizures.  Hx of dysphagia/cognitive impairment after admission last year - dysphagia resolved prior to D/C .  Subjective: alert Assessment / Plan / Recommendation CHL IP CLINICAL IMPRESSIONS 05/24/2018 Clinical Impression Patient presents with a mild oropharyngeal dysphagia. Intermittent oral delays noted, likely related to lethargy. Even regular solids however masticated efficiently and cleared fully without cueing. Airway protected across consistencies with only one episode of trace transient penetration of thin liquids, clearing with subsequent swallows. Noted trace pharyngeal coating throughout study and pill remaining in vallecula post swallow initially, both thought to be relatd to the presence of her NG tube. Recommend removal of NG tube prior to beginning pos. Recommend initiation of dysphagia 3 diet (given fluctuating level of alertness), thin liquid with full supervision initially. SLP will f/u for tolerance and upgrade.  SLP Visit Diagnosis Dysphagia, oropharyngeal phase (R13.12) Attention and concentration deficit following -- Frontal lobe and executive function deficit  following -- Impact on safety and function Mild aspiration risk   CHL IP TREATMENT RECOMMENDATION 05/24/2018 Treatment Recommendations Therapy as outlined in treatment plan below   Prognosis 05/24/2018 Prognosis for Safe Diet Advancement Good Barriers to Reach Goals Cognitive deficits Barriers/Prognosis Comment -- CHL IP DIET RECOMMENDATION 05/24/2018 SLP Diet Recommendations Dysphagia 3 (Mech soft) solids;Thin liquid Liquid Administration via Cup;Straw Medication Administration Crushed with puree Compensations Slow rate;Small sips/bites Postural Changes Seated upright at 90 degrees   CHL IP OTHER RECOMMENDATIONS 05/24/2018 Recommended Consults -- Oral Care Recommendations Oral care BID Other Recommendations --   CHL IP FOLLOW UP RECOMMENDATIONS 05/24/2018 Follow up Recommendations  None   CHL IP FREQUENCY AND DURATION 05/24/2018 Speech Therapy Frequency (ACUTE ONLY) min 1 x/week Treatment Duration 1 week      CHL IP ORAL PHASE 05/24/2018 Oral Phase WFL Oral - Pudding Teaspoon -- Oral - Pudding Cup -- Oral - Honey Teaspoon -- Oral - Honey Cup -- Oral - Nectar Teaspoon -- Oral - Nectar Cup -- Oral - Nectar Straw -- Oral - Thin Teaspoon -- Oral - Thin Cup -- Oral - Thin Straw -- Oral - Puree -- Oral - Mech Soft -- Oral - Regular -- Oral - Multi-Consistency -- Oral - Pill -- Oral Phase - Comment --  CHL IP PHARYNGEAL PHASE 05/24/2018 Pharyngeal Phase Impaired Pharyngeal- Pudding Teaspoon -- Pharyngeal -- Pharyngeal- Pudding Cup -- Pharyngeal -- Pharyngeal- Honey Teaspoon -- Pharyngeal -- Pharyngeal- Honey Cup -- Pharyngeal -- Pharyngeal- Nectar Teaspoon -- Pharyngeal -- Pharyngeal- Nectar Cup -- Pharyngeal -- Pharyngeal- Nectar Straw -- Pharyngeal -- Pharyngeal- Thin Teaspoon -- Pharyngeal -- Pharyngeal- Thin Cup Penetration/Aspiration during swallow Pharyngeal Material enters airway, remains ABOVE vocal cords then ejected out Pharyngeal- Thin Straw -- Pharyngeal -- Pharyngeal- Puree -- Pharyngeal -- Pharyngeal- Mechanical Soft --  Pharyngeal -- Pharyngeal- Regular -- Pharyngeal -- Pharyngeal- Multi-consistency -- Pharyngeal -- Pharyngeal- Pill Pharyngeal residue - valleculae Pharyngeal -- Pharyngeal Comment --  CHL IP CERVICAL ESOPHAGEAL PHASE 05/24/2018 Cervical Esophageal Phase (No Data) Pudding Teaspoon -- Pudding Cup -- Honey Teaspoon -- Honey Cup -- Nectar Teaspoon -- Nectar Cup -- Nectar Straw -- Thin Teaspoon -- Thin Cup -- Thin Straw -- Puree -- Mechanical Soft -- Regular -- Multi-consistency -- Pill -- Cervical Esophageal Comment -- Gabriel Rainwater MA, CCC-SLP McCoy Leah Meryl 05/24/2018, 11:09 AM               Assessment/Plan: Diagnosis: Seizures Labs and images (see above) independently reviewed.  Records reviewed and summated above.  1. Does the need for close, 24 hr/day medical supervision in concert with the patient's rehab needs make it unreasonable for this patient to be served in a less intensive setting? Potentially  2. Co-Morbidities requiring supervision/potential complications: subarachnoid hemorrhage (s/p coil embolization of supraclinoid left internal carotid artery aneurysm), ABLA (transfuse if necessary to ensure appropriate perfusion for increased activity tolerance), hypokalemia (continue to monitor and replete as necessary) 3. Due to safety, disease management and patient education, does the patient require 24 hr/day rehab nursing? Potentially 4. Does the patient require coordinated care of a physician, rehab nurse, PT (1-2 hrs/day, 5 days/week) and OT (1-2 hrs/day, 5 days/week) to address physical and functional deficits in the context of the above medical diagnosis(es)? Potentially Addressing deficits in the following areas: balance, endurance, locomotion, strength, transferring, bathing, dressing, toileting and psychosocial support 5. Can the patient actively participate in an intensive therapy program of at least 3 hrs of therapy per day at least 5 days per week? Yes 6. The potential for patient to make  measurable gains while on inpatient rehab is good 7. Anticipated functional outcomes upon discharge from inpatient rehab are modified independent and supervision  with PT, modified independent and supervision with OT, n/a with SLP. 8. Estimated rehab length of stay to reach the above functional goals is: 5-8 days. 9. Anticipated D/C setting: Home 10. Anticipated post D/C treatments: HH therapy and Home excercise program 11. Overall Rehab/Functional Prognosis: excellent  RECOMMENDATIONS: This patient's condition is appropriate for continued rehabilitative care in the following setting: Patient has made significant functional progress in a short period of time. This is consistent with  postictal recovery and anticipate patient will continue to make functional gains. At this time do not believe patient requires CIR, however will continue to follow. Patient has agreed to participate in recommended program. Potentially Note that insurance prior authorization may be required for reimbursement for recommended care.  Comment: Rehab Admissions Coordinator to follow up.   I have personally performed a face to face diagnostic evaluation, including, but not limited to relevant history and physical exam findings, of this patient and developed relevant assessment and plan.  Additionally, I have reviewed and concur with the physician assistant's documentation above.   Delice Lesch, MD, ABPMR Lavon Paganini Angiulli, PA-C 05/26/2018

## 2018-05-26 NOTE — Progress Notes (Signed)
  Speech Language Pathology Treatment: Dysphagia  Patient Details Name: Carly [name] MRN: [id number] DOB: Feb 10, 1971 Today's Date: 05/26/2018 Time: 1212-1224 SLP Time Calculation (min) (ACUTE ONLY): 12 min  Assessment / Plan / Recommendation Clinical Impression  Pt demonstrates improved spontaneity of responses, improved initiation in conversational exchange.  She reports thin liquids causing her to cough -  Tends to occur more often with a straw.  Pt consumed dys3 solids, thin liquids from a cup with good pacing/control and no cough after initial cues, no s/s of aspiration.  Continue crushing meds given barium pill noted to lodge in valleculae during MBS; continue dysphagia 3, thin liquids, avoid straws.  SLP to follow-up briefly for safety/diet advancement.  Doubt f/u needed for swallowing, but pt will benefit from thorough speech/language evaluation in CIR.    HPI HPI: 47 y.o. female admitted due to recurrence of left supraclinoid aneurysm after treatment with coil embolization after rupture June 2018.  Underwent elective Surpass embolization 8/30. Mild aphasia post-op, developed seizures; intubated 9/1-9/6.  Now free of seizures.  Hx of dysphagia/cognitive impairment after admission last year - dysphagia resolved prior to D/C .       SLP Plan  Continue with current plan of care       Recommendations  Diet recommendations: Dysphagia 3 (mechanical soft);Thin liquid Liquids provided via: Cup Medication Administration: Crushed with puree Supervision: Patient able to self feed Compensations: Slow rate;Small sips/bites                Oral Care Recommendations: Oral care BID Follow up Recommendations: None(for dysphagia) SLP Visit Diagnosis: Dysphagia, oropharyngeal phase (R13.12) Plan: Continue with current plan of care       GO                [name] 05/26/2018, 12:29 PM

## 2018-05-26 NOTE — PMR Pre-admission (Signed)
PMR Admission Coordinator Pre-Admission Assessment  Patient: Carly [name] is an 47 y.o., female MRN: [id number] DOB: 1970/12/05 Height: '5\' 4"'  (162.6 cm) Weight: 89.2 kg              Insurance Information HMO:    PPO: Yes     PCP:      IPA:      80/20:      OTHER:  PRIMARY: [employer] Commercial     Policy#: [id number]      Subscriber: Spouse Shiela Mayer)  CM Name: Rudene Re      Phone#: [phone]     Fax#: [phone] Carly [name] was provided by Danton Sewer at [facility] for admit to CIR today. Clinical updates are due in 7 days to [place].  Pre-Cert#: [id number]      Employer:  Benefits:  Phone #: NA     Name: [email or web address]  Eff. Date: 09/16/17     Deduct: $0      Out of Pocket Max: $6,550 (met $5,597.35)      Life Max: NA CIR: 80% coverage with insurance approval      SNF: 80%/20% with 120 day limit Outpatient: 80%; 60 combined all therapies (PT/OT/SLP/Card.pulm)     Co-Pay: 20% Home Health: 80%; 120 visits/cal year (4 hours or less each)      Co-Pay: 20% DME: 80%    Co-Pay: 20% Providers:  SECONDARY:       Policy#:       Subscriber:  CM Name:       Phone#:      Fax#:  Pre-Cert#:       Employer:  Benefits:  Phone #:      Name:  Eff. 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  Per  chart review patient lives with spouse and 22 year old daughter in [place].  Independent prior to admission.  One level home with 3 steps to entry.  Patient recently underwent follow-up diagnostic cerebral angiogram that showed enlargement of residual aneurysm.  She was admitted 05/15/2018 and underwent embolization of residual aneurysm 05/15/2018 per Dr. Kathyrn Sheriff.  Postoperative seizure right side weakness and neglect.  Received additional 500 mg of Keppra as she was already on at thousand milligrams twice daily.  Initial fall cranial CT scan reviewed, unremarkable for acute intracranial process.  EEG showed at least 4 left hemispheric subclinical seizures recorded.  MRI of the brain showed small region of cortical T2 flair hyperintensity centered within the left posterior temporal lobe with a few areas of reduced diffusion.  Findings probably represented seizure-like activity.  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  Presently on Plavix and aspirin for CVA prophylaxis as prior to admission.  Subcutaneous heparin for DVT prophylaxis initiated 05/20/2018.  She continues on Vimpat, Keppra as well as Trileptal for seizure disorder.  Therapy evaluations completed with recommendations of physical medicine rehab consult.  Patient is to be admitted for a comprehensive rehab program on 05/28/18.      Past Medical History  Past Medical History:  Diagnosis Date  . GERD (gastroesophageal reflux disease)   . Hypertension   . Memory difficulties    short term memory  . Migraines   . Seizures ([place])    with hemorrhage  . Subarachnoid hemorrhage ([facility])    d/t burst aneurysm, had seizures    Family History  family history includes Cancer in her father; Diabetes in her maternal grandmother and mother; Hyperlipidemia in her mother and paternal grandfather; Hypertension in her  mother.  Prior Rehab/Hospitalizations:  Has the patient had major surgery during 100 days prior to admission? No  Current Medications   Current Facility-Administered Medications:  .  0.9 %  sodium chloride infusion, , Intravenous, Continuous, Erick Colace, NP, Stopped at 05/28/18 0847 .  acetaminophen (TYLENOL) tablet 650 mg, 650 mg, Oral, Q6H PRN, Consuella Lose, MD, 650 mg at 05/26/18 2104 .  aspirin tablet 325 mg, 325 mg, Oral, Daily, Consuella Lose, MD, 325 mg at 05/28/18 1001 .  ceFAZolin (ANCEF) IVPB 2g/100 mL premix, 2 g, Intravenous, Q8H, Erick Colace, NP, Last Rate: 200 mL/hr at 05/28/18 0557, 2 g at 05/28/18 0557 .  Chlorhexidine Gluconate Cloth 2 % PADS 6 each, 6 each, Topical, Daily, Consuella Lose, MD, 6 each at 05/28/18 1001 .  clopidogrel (PLAVIX) tablet 75 mg, 75 mg, Oral, Daily, Consuella Lose, MD, 75 mg at 05/28/18 1000 .  feeding supplement (ENSURE ENLIVE) (ENSURE ENLIVE) liquid 237 mL, 237 mL, Oral, BID BM, Consuella Lose, MD, 237 mL at 05/27/18 2158 .  heparin injection 5,000 Units, 5,000 Units, Subcutaneous, Q8H, Agarwala, Ravi, MD, 5,000 Units at 05/28/18 0500 .  HYDROcodone-acetaminophen (NORCO/VICODIN) 5-325 MG per tablet 1 tablet, 1 tablet, Oral, Q4H PRN, Consuella Lose, MD .  ibuprofen (ADVIL,MOTRIN) tablet 400 mg, 400 mg, Oral, Q6H PRN, Consuella Lose, MD .  labetalol (NORMODYNE,TRANDATE) injection 10-40 mg, 10-40 mg, Intravenous, Q10 min PRN, Consuella Lose, MD, 20 mg at 05/22/18 1716 .  lacosamide (VIMPAT) tablet 200 mg, 200 mg, Oral, BID, Amie Portland, MD, 200 mg at 05/28/18 1000 .  levETIRAcetam (KEPPRA) tablet 1,500 mg, 1,500 mg, Oral, BID, Amie Portland, MD, 1,500 mg at 05/28/18 1001 .  LORazepam (ATIVAN) injection 1-2 mg, 1-2 mg, Intravenous, Q4H PRN, Erline Levine, MD, 2 mg at 05/17/18 1502 .  morphine 2 MG/ML injection 1-2 mg, 1-2 mg, Intravenous, Q1H PRN, Erline Levine, MD, 2 mg at 05/24/18 0843 .  multivitamin with  minerals tablet 1 tablet, 1 tablet, Oral, Daily, Consuella Lose, MD, 1 tablet at 05/28/18 1001 .  ondansetron (ZOFRAN) tablet 4 mg, 4 mg, Oral, Q4H PRN **OR** ondansetron (ZOFRAN) injection 4 mg, 4 mg, Intravenous, Q4H PRN, Consuella Lose, MD .  OXcarbazepine (TRILEPTAL) tablet 750 mg, 750 mg, Oral, BID, Amie Portland, MD, 750 mg at 05/28/18 1000 .  pantoprazole (PROTONIX) EC tablet 40 mg, 40 mg, Oral, Daily, Consuella Lose, MD, 40 mg at 05/28/18 1001 .  sodium chloride flush (NS) 0.9 % injection 10-40 mL, 10-40 mL,  Intracatheter, Q12H, Consuella Lose, MD, 10 mL at 05/28/18 1001 .  sodium chloride flush (NS) 0.9 % injection 10-40 mL, 10-40 mL, Intracatheter, PRN, Consuella Lose, MD  Patients Current Diet:  Diet Order            Diet general        DIET DYS 3 Room service appropriate? Yes; Fluid consistency: Thin  Diet effective now              Precautions / Restrictions Precautions Precautions: Fall Restrictions Weight Bearing Restrictions: No   Has the patient had 2 or more falls or a fall with injury in the past year?No  Prior Activity Level Community (5-7x/wk): driving, on disability in terms of job, but active  Development worker, international aid / Equipment    Prior Device Use: Indicate devices/aids used by the patient prior to current illness, exacerbation or injury? Walker  Prior Functional Level Prior Function Comments: no family available to ascertain PLOF and pt unable to relate  Self Care: Did the patient need help bathing, dressing, using the toilet or eating?  Independent  Indoor Mobility: Did the patient need assistance with walking from room to room (with or without device)? Independent  Stairs: Did the patient need assistance with internal or external stairs (with or without device)? Independent  Functional Cognition: Did the patient need help planning regular tasks such as shopping or remembering to take medications? Independent  Current  Functional Level Cognition  Overall Cognitive Status: Impaired/Different from baseline Orientation Level: Oriented X4 Following Commands: Follows multi-step commands with increased time, Follows multi-step commands inconsistently General Comments: Continues to present with decreased processing    Extremity Assessment (includes Sensation/Coordination)  Upper Extremity Assessment: RUE deficits/detail RUE Deficits / Details: Pt with significant increased in gross motor strength and coordination. Pt continues to present with decreased FM coorindation. Pt with difficulty with ifnger dexerity and performing finger opposition. RUE: (cognition impacting assessment) RUE Sensation: decreased light touch RUE Coordination: decreased fine motor LUE Deficits / Details: no movement noted, did not pull away from nail bed pressure LUE Sensation: decreased light touch, decreased proprioception LUE Coordination: decreased fine motor, decreased gross motor  Lower Extremity Assessment: Defer to PT evaluation RLE Deficits / Details: no spontaneous movements noted LLE Deficits / Details: no spontaneous movement    ADLs  Overall ADL's : Needs assistance/impaired Grooming: Oral care, Wash/dry face, Min guard, Standing Grooming Details (indicate cue type and reason): Pt performing grooming tasks at sink with [employer] A for safety. Pt requiring increased time for processing and benefits from Min verbal cues to clean toothpaste from her mouth.  Toilet Transfer: Minimal assistance, Ambulation, Regular Toilet Toilet Transfer Details (indicate cue type and reason): Family requesting assistance for toilet due to pt needing to use restroom. Min A for single hand held.  Functional mobility during ADLs: Minimal assistance General ADL Comments: Upon arrival, pt requesting to use restroom. Pt stating she needs to use bathroom questions and has already started. Continues to require min A.    Mobility  Overal bed mobility:  Needs Assistance Bed Mobility: Supine to Sit, Sit to Supine Supine to sit: Min assist Sit to supine: Min assist General bed mobility comments: Pt was OOB in the recliner chair.     Transfers  Overall transfer level: Needs assistance Equipment used: 1 person hand held assist Transfers: Sit to/from Stand Sit to Stand: Min assist General transfer comment: Min A for single hand held A    Ambulation / Gait /  Stairs / Emergency planning/management officer  Ambulation/Gait Ambulation/Gait assistance: Min guard, Herbalist (Feet): 120 Feet Assistive device: Rolling walker (2 wheeled), 1 person hand held assist Gait Pattern/deviations: Step-through pattern(slow) General Gait Details: Pt with slow, guarded gait, min guard assist with RW, when asked to walk without RW, pt was min hand held assist for balance.  She prefers RW, but I educated her that PT will start doing things to make her a bit unsteady/uncomfortable to help make her better/more independent.  Gait speed significantly slowed without RW as well.  Gait velocity: 1.02 ft/sec with RW Gait velocity interpretation: <1.31 ft/sec, indicative of household ambulator    Posture / Balance Dynamic Sitting Balance Sitting balance - Comments: difficulty maintaining balance donning socks at EOB Balance Overall balance assessment: Needs assistance Sitting-balance support: Feet supported, No upper extremity supported Sitting balance-Leahy Scale: Fair Sitting balance - Comments: difficulty maintaining balance donning socks at EOB Standing balance support: No upper extremity supported Standing balance-Leahy Scale: Fair Standing balance comment: static only, otherwise needs minimal external support    Special needs/care consideration BiPAP/CPAP: no CPM: no Continuous Drip IV: ceFAZolin (ANCEF); 0/9% Sodium Chloride infusion  Dialysis: no        Days: no Life Vest: no Oxygen: no Special Bed: no Trach Size: no Wound Vac (area): no      Location:  no Skin: surgical incision of right groin                         Bowel mgmt: last BM 05/25/18.  Bladder mgmt: continent, urgency listed in chart Diabetic mgmt: no     Previous Home Environment Living Arrangements: Spouse/significant other, Children Additional Comments: no family present, Pt unable to answer details  Discharge Living Setting Plans for Discharge Living Setting: Patient's home, Lives with (comment)(with husband and 11 yo daugther) Type of Home at Discharge: House Discharge Home Layout: One level Discharge Home Access: Stairs to enter Entrance Stairs-Rails: Left Entrance Stairs-Number of Steps: 3 Discharge Bathroom Shower/Tub: Tub/shower unit Discharge Bathroom Toilet: Standard Discharge Bathroom Accessibility: Yes How Accessible: Accessible via walker Does the patient have any problems obtaining your medications?: No  Social/Family/Support Systems Patient Roles: Spouse, Parent Contact Information: spouse is emergency contact Anticipated Caregiver: spouse [name]) and daugther  Anticipated Caregiver's Contact Information: [name] [phone] Ability/Limitations of Caregiver: 24/7 physical assist as needed Caregiver Availability: 24/7 Discharge Plan Discussed with Primary Caregiver: Yes Is Caregiver In Agreement with Plan?: Yes Does Caregiver/Family have Issues with Lodging/Transportation while Pt is in Rehab?: No   Goals/Additional Needs Patient/Family Goal for Rehab: PT/OT: Mod I/Supervision; SLP: NA Expected length of stay: 5-8 days Cultural Considerations: NA Dietary Needs: DYS 3, thin liquids Equipment Needs: TBD Pt/Family Agrees to Admission and willing to participate: Yes Program Orientation Provided & Reviewed with Pt/Caregiver Including Roles  & Responsibilities: Yes  Barriers to Discharge: Home environment access/layout, Inaccessible home environment  Barriers to Discharge Comments: 3 steps to enter; tub shower.    Decrease burden of Care through IP  rehab admission: NA   Possible need for SNF placement upon discharge: Not anticipated; pt has good social support and good prognosis for further progress.    Patient Condition: This patient's medical and functional status has changed since the consult dated: 05/26/18 in which the Rehabilitation Physician determined and documented that the patient's condition is appropriate for intensive rehabilitative care in an inpatient rehabilitation facility. See "History of Present Illness" (above) for medical update. Functional changes are: Min  A 80 feet to Min G/Min A 120 feet with RW. Patient's medical and functional status update has been discussed with the Rehabilitation physician and patient remains appropriate for inpatient rehabilitation. Will admit to inpatient rehab today.  Preadmission Screen Completed By:  Jhonnie Garner, 05/28/2018 12:51 PM ______________________________________________________________________   Discussed status with Dr. Letta Pate on 05/28/18 at 12:51PM and received telephone approval for admission today.  Admission Coordinator:  Jhonnie Garner, time 12:51PM/Date 05/28/18.

## 2018-05-26 NOTE — Progress Notes (Signed)
Nutrition Follow-up  DOCUMENTATION CODES:   Obesity unspecified  INTERVENTION:   Ensure Enlive po BID, each supplement provides 350 kcal and 20 grams of protein  Encourage PO intake  NUTRITION DIAGNOSIS:   Inadequate oral intake related to decreased appetite as evidenced by meal completion < 50%. Ongoing.   GOAL:   Patient will meet greater than or equal to 90% of their needs Progressing.   MONITOR:   PO intake, Supplement acceptance  ASSESSMENT:   47 year old female with PMH significant for GERD, hypertension, seizures, and subarachnoid hemorrhage and underwent coil embolization of a supraclinoid left internal carotid artery aneurysm in June 2018.  9/6 extubated, Cortrak removed 9/8 diet advanced to dysphagia III wit hthin liquids  Therapy following and recommends CIR.   Pt reports that her appetite is decreased only consuming about 50% of her meals. She states she likes the food but complains that it is salty. She is willing to drink ensure until appetite is better.   Medications reviewed and include: MVI Labs reviewed: K+ 3.3 (L) BP: 129/80 MAP: 95   I/O: -2.4 L since admit UOP: 500 ml x 24 hrs  Diet Order:   Diet Order            DIET DYS 3 Room service appropriate? Yes; Fluid consistency: Thin  Diet effective now              EDUCATION NEEDS:   No education needs have been identified at this time  Skin:  Skin Assessment: Skin Integrity Issues: Skin Integrity Issues:: Incisions Incisions: R groin  Last BM:  9/9 - medium type 4  Height:   Ht Readings from Last 1 Encounters:  05/15/18 5\' 4"  (1.626 m)    Weight:   Wt Readings from Last 1 Encounters:  05/26/18 92 kg    Ideal Body Weight:  54.55 kg  BMI:  Body mass index is 34.81 kg/m.  Estimated Nutritional Needs:   Kcal:  1700-1900  Protein:  85-100 grams  Fluid:  > 1.7 L/day  Maylon Peppers RD, LDN, CNSC [phone] Pager [phone] After Hours Pager

## 2018-05-27 ENCOUNTER — Encounter (HOSPITAL_COMMUNITY): Payer: Self-pay

## 2018-05-27 LAB — CBC WITH DIFFERENTIAL/PLATELET
Abs Immature Granulocytes: 0.1 10*3/uL (ref 0.0–0.1)
BASOS PCT: 1 %
Basophils Absolute: 0 10*3/uL (ref 0.0–0.1)
EOS ABS: 0.2 10*3/uL (ref 0.0–0.7)
EOS PCT: 2 %
HCT: 36.7 % (ref 36.0–46.0)
Hemoglobin: 11.9 g/dL — ABNORMAL LOW (ref 12.0–15.0)
Immature Granulocytes: 1 %
Lymphocytes Relative: 34 %
Lymphs Abs: 2.1 10*3/uL (ref 0.7–4.0)
MCH: 27.5 pg (ref 26.0–34.0)
MCHC: 32.4 g/dL (ref 30.0–36.0)
MCV: 85 fL (ref 78.0–100.0)
MONO ABS: 0.9 10*3/uL (ref 0.1–1.0)
Monocytes Relative: 15 %
Neutro Abs: 2.9 10*3/uL (ref 1.7–7.7)
Neutrophils Relative %: 47 %
PLATELETS: 275 10*3/uL (ref 150–400)
RBC: 4.32 MIL/uL (ref 3.87–5.11)
RDW: 13.6 % (ref 11.5–15.5)
WBC: 6.1 10*3/uL (ref 4.0–10.5)

## 2018-05-27 LAB — GLUCOSE, CAPILLARY
Glucose-Capillary: 121 mg/dL — ABNORMAL HIGH (ref 70–99)
Glucose-Capillary: 94 mg/dL (ref 70–99)
Glucose-Capillary: 128 mg/dL — ABNORMAL HIGH (ref 70–99)

## 2018-05-27 LAB — PHENYTOIN LEVEL, TOTAL: PHENYTOIN LVL: 7.7 ug/mL — AB (ref 10.0–20.0)

## 2018-05-27 NOTE — Progress Notes (Signed)
Occupational Therapy Treatment Patient Details Name: Carly [name] MRN: [id number] DOB: [birth year] Today's Date: 05/27/2018    History of present illness 47 yo admitted with left supraclonoid aneurysm recurrence with aphasia and right neglect. Sz 9/1. 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  pt sedated for burst suppression for 48 hours,  Seizure control achieved on 9/3. and sedation weaned 9/5.  Pt extubated 9/6.  PMhx: SAH s/p aneurysm coiling 02/2017, adjustment d/o, HTN, GERD.   OT comments  Pt requesting assistance for mobility to restroom; RN and NT busy with other pts. Pt requiring Min A for functional mobility to bathroom and Min A for toilet transfer. Pt continues to present with decreased balance and cognition. Continue to recommend dc to CIR and will continue to follow acutely as admitted.   Follow Up Recommendations  CIR    Equipment Recommendations  Other (comment)(TBD)    Recommendations for Other Services Speech consult    Precautions / Restrictions Precautions Precautions: Fall       Mobility Bed Mobility               General bed mobility comments: Pt was OOB in the recliner chair.   Transfers Overall transfer level: Needs assistance Equipment used: 1 person hand held assist Transfers: Sit to/from Stand Sit to Stand: Min assist         General transfer comment: Min A for single hand held A    Balance Overall balance assessment: Needs assistance Sitting-balance support: Feet supported;No upper extremity supported Sitting balance-Leahy Scale: Fair Sitting balance - Comments: difficulty maintaining balance donning socks at EOB   Standing balance support: No upper extremity supported Standing balance-Leahy Scale: Fair Standing balance comment: static only, otherwise needs minimal external support                           ADL either performed or assessed with clinical judgement    ADL Overall ADL's : Needs assistance/impaired                         Toilet Transfer: Minimal assistance;Ambulation;Regular Glass blower/designer Details (indicate cue type and reason): Family requesting assistance for toilet due to pt needing to use restroom. Min A for single hand held.          Functional mobility during ADLs: Minimal assistance General ADL Comments: Upon arrival, pt requesting to use restroom. Pt stating she needs to use bathroom questions and has already started. Continues to require min A.     Vision       Perception     Praxis      Cognition Arousal/Alertness: Awake/alert Behavior During Therapy: Flat affect Overall Cognitive Status: Impaired/Different from baseline Area of Impairment: Problem solving;Following commands                       Following Commands: Follows multi-step commands with increased time;Follows multi-step commands inconsistently     Problem Solving: Slow processing General Comments: Continues to present with decreased processing        Exercises     Shoulder Instructions       General Comments best friend present during session    Pertinent Vitals/ Pain       Pain Assessment: No/denies pain  Home Living  Prior Functioning/Environment              Frequency  Min 2X/week        Progress Toward Goals  OT Goals(current goals can now be found in the care plan section)  Progress towards OT goals: Progressing toward goals  Acute Rehab OT Goals Patient Stated Goal: able to do myself ADL Goals Pt Will Perform Grooming: with modified independence;standing Additional ADL Goal #1: Pt will perform ADLs at Mod I level  Plan Discharge plan remains appropriate    Co-evaluation                 AM-PAC PT "6 Clicks" Daily Activity     Outcome Measure   Help from another person eating meals?: None Help from another person  taking care of personal grooming?: A Little Help from another person toileting, which includes using toliet, bedpan, or urinal?: A Little Help from another person bathing (including washing, rinsing, drying)?: A Little Help from another person to put on and taking off regular upper body clothing?: A Little Help from another person to put on and taking off regular lower body clothing?: A Little 6 Click Score: 19    End of Session    OT Visit Diagnosis: Muscle weakness (generalized) (M62.81);Other symptoms and signs involving cognitive function   Activity Tolerance Patient tolerated treatment well   Patient Left with call bell/phone within reach;with nursing/sitter in room(bathroom with NT)   Nurse Communication Mobility status        Time: 5035-4656 OT Time Calculation (min): 9 min  Charges: OT General Charges $OT Visit: 1 Visit OT Treatments $Self Care/Home Management : 8-22 mins  [place], OTR/L Acute Rehab Pager: [phone] Office: [place] 05/27/2018, 5:20 PM

## 2018-05-27 NOTE — Progress Notes (Signed)
Inpatient Rehabilitation-Admissions Coordinator   Noted pt still requiring assistance with mobility and ADLs. AC will submit for insurance authorization for CIR. Will follow up once decision has been made.   Please call if questions.   Jhonnie Garner, OTR/L  Rehab Admissions Coordinator  [phone] 05/27/2018 1:09 PM

## 2018-05-27 NOTE — Progress Notes (Signed)
Late Entry for pm 9/10  Pt's gait generally steady, but guarded, worsening with scanning, abrupt directional and speed changes.    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 Sz 9/1. 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  pt sedated for burst suppression for 48 hours,  Seizure control achieved on 9/3. and sedation weaned 9/5.  Pt extubated 9/6.  PMhx: SAH s/p aneurysm coiling 02/2017, adjustment d/o, HTN, GERD.  Subjective Data  Patient Stated Goal able to do myself  Precautions  Precautions Fall  Pain Assessment  Pain Assessment Faces  Faces Pain Scale 0  Cognition  Arousal/Alertness Awake/alert  Behavior During Therapy WFL for tasks assessed/performed  Overall Cognitive Status Impaired/Different from baseline  Area of Impairment Problem solving  Problem Solving Slow processing  Bed Mobility  Overal bed mobility Needs Assistance  Bed Mobility Supine to Sit;Sit to Supine  Supine to sit Min assist  Sit to supine Min assist  General bed mobility comments increased time to execute commands  Transfers  Overall transfer level Needs assistance  Transfers Sit to/from Stand  Sit to Stand Min assist  General transfer comment steady assist  Ambulation/Gait  Ambulation/Gait assistance Min assist  Gait Distance (Feet) 120 Feet  Assistive device IV Pole;None  Gait Pattern/deviations Step-through pattern  General Gait Details short guarded steps. More unsteady with scanning, turns and backing up, but no overt LOB  Gait velocity interpretation 1.31 - 2.62 ft/sec, indicative of limited community ambulator  Modified Rankin (Stroke Patients Only)  Pre-Morbid Rankin Score 0  Modified Rankin 4  Balance  Overall balance assessment Needs assistance  Sitting balance-Leahy Scale  Fair  Standing balance-Leahy Scale Fair  PT - End of Session  Activity Tolerance Patient tolerated treatment well  Patient left in bed;with call bell/phone within reach;with family/visitor present  Nurse Communication Mobility status   PT - Assessment/Plan  PT Plan Current plan remains appropriate  PT Visit Diagnosis Other abnormalities of gait and mobility (R26.89);Muscle weakness (generalized) (M62.81);Other symptoms and signs involving the nervous system (R29.898)  PT Frequency (ACUTE ONLY) Min 3X/week  Follow Up Recommendations CIR;Supervision/Assistance - 24 hour  PT equipment  (TBA)  AM-PAC PT "6 Clicks" Daily Activity Outcome Measure  Difficulty turning over in bed (including adjusting bedclothes, sheets and blankets)? 1  Difficulty moving from lying on back to sitting on the side of the bed?  1  Difficulty sitting down on and standing up from a chair with arms (e.g., wheelchair, bedside commode, etc,.)? 1  Help needed moving to and from a bed to chair (including a wheelchair)? 3  Help needed walking in hospital room? 3  6 Click Score 9  Mobility G Code  CL  PT Goal Progression  Progress towards PT goals Progressing toward goals  Acute Rehab PT Goals  PT Goal Formulation With patient  Time For Goal Achievement 06/05/18  Potential to Achieve Goals Good  PT Time Calculation  PT Start Time (ACUTE ONLY) 1757  PT Stop Time (ACUTE ONLY) 1810  PT Time Calculation (min) (ACUTE ONLY) 13 min  PT General Charges  $$ ACUTE PT VISIT 1 Visit  PT Treatments  $Gait Training 8-22 mins   05/27/2018  Donnella Sham, PT Acute Rehabilitation Services [phone]  (pager) [phone]  (office)

## 2018-05-27 NOTE — Plan of Care (Signed)
  Problem: Education: Goal: Knowledge of General Education information will improve Description Including pain rating scale, medication(s)/side effects and non-pharmacologic comfort measures Outcome: Progressing   Problem: Clinical Measurements: Goal: Ability to maintain clinical measurements within normal limits will improve Outcome: Progressing Goal: Will remain free from infection Outcome: Progressing Goal: Diagnostic test results will improve Outcome: Progressing Goal: Respiratory complications will improve Outcome: Progressing Goal: Cardiovascular complication will be avoided Outcome: Progressing   Problem: Activity: Goal: Risk for activity intolerance will decrease Outcome: Progressing   Problem: Nutrition: Goal: Adequate nutrition will be maintained Outcome: Progressing   Problem: Elimination: Goal: Will not experience complications related to bowel motility Outcome: Progressing Goal: Will not experience complications related to urinary retention Outcome: Progressing   Problem: Pain Managment: Goal: General experience of comfort will improve Outcome: Progressing   Problem: Safety: Goal: Ability to remain free from injury will improve Outcome: Progressing   Problem: Skin Integrity: Goal: Risk for impaired skin integrity will decrease Outcome: Progressing   Problem: Activity: Goal: Ability to tolerate increased activity will improve Outcome: Progressing

## 2018-05-27 NOTE — Progress Notes (Signed)
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 Sz 9/1. 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  pt sedated for burst suppression for 48 hours,  Seizure control achieved on 9/3. and sedation weaned 9/5.  Pt extubated 9/6.  PMhx: SAH s/p aneurysm coiling 02/2017, adjustment d/o, HTN, GERD.    PT Comments    Pt is progressing well with gait and mobility.  She feels safer, walks faster (although still <1.8 ft/sec), and requires less assist with RW use, however, she did not use RW PTA and could be weaned off of it with intensive therapy.  She is still quite flat, slow to process and has difficulty with multi step command following.  She would benefit from intensive multidisciplinary post acute rehab prior to d/c home with her husband's assist.  PT will continue to follow acutely to progress safe mobility, working more on balance and gait training without an assistive device.    Follow Up Recommendations  CIR;Supervision/Assistance - 24 hour     Equipment Recommendations  Rolling walker with 5" wheels;Other (comment)(would like to wean from RW prior to d/c)    Recommendations for Other Services   NA     Precautions / Restrictions Precautions Precautions: Fall Restrictions Weight Bearing Restrictions: No    Mobility  Bed Mobility               General bed mobility comments: Pt was OOB in the recliner chair.   Transfers Overall transfer level: Needs assistance Equipment used: Rolling walker (2 wheeled) Transfers: Sit to/from Stand Sit to Stand: Min guard         General transfer comment: Min guard assist for safety, pt prefered to walk with RW when therapist offered hand held assist.   Ambulation/Gait Ambulation/Gait assistance: Min  guard;Min assist Gait Distance (Feet): 120 Feet Assistive device: Rolling walker (2 wheeled);1 person hand held assist Gait Pattern/deviations: Step-through pattern(slow) Gait velocity: 1.02 ft/sec with RW Gait velocity interpretation: <1.31 ft/sec, indicative of household ambulator General Gait Details: Pt with slow, guarded gait, min guard assist with RW, when asked to walk without RW, pt was min hand held assist for balance.  She prefers RW, but I educated her that PT will start doing things to make her a bit unsteady/uncomfortable to help make her better/more independent.  Gait speed significantly slowed without RW as well.        Modified Rankin (Stroke Patients Only) Modified Rankin (Stroke Patients Only) Pre-Morbid Rankin Score: No symptoms Modified Rankin: Moderately severe disability     Balance Overall balance assessment: Needs assistance Sitting-balance support: Feet supported;No upper extremity supported Sitting balance-Leahy Scale: Fair     Standing balance support: No upper extremity supported Standing balance-Leahy Scale: Fair                              Cognition Arousal/Alertness: Awake/alert Behavior During Therapy: Flat affect Overall Cognitive Status: Impaired/Different from baseline Area of Impairment: Problem solving;Following commands                       Following Commands: Follows multi-step commands with increased time;Follows multi-step commands inconsistently     Problem Solving: Slow processing General Comments: Pt is slow to move and slow to  process.  When given a multi step command she often needs PT to repeat it before she can complete the task             Pertinent Vitals/Pain Pain Assessment: No/denies pain           PT Goals (current goals can now be found in the care plan section) Acute Rehab PT Goals Patient Stated Goal: able to do myself Progress towards PT goals: Progressing toward goals     Frequency    Min 3X/week      PT Plan Current plan remains appropriate       AM-PAC PT "6 Clicks" Daily Activity  Outcome Measure  Difficulty turning over in bed (including adjusting bedclothes, sheets and blankets)?: Unable Difficulty moving from lying on back to sitting on the side of the bed? : Unable Difficulty sitting down on and standing up from a chair with arms (e.g., wheelchair, bedside commode, etc,.)?: Unable Help needed moving to and from a bed to chair (including a wheelchair)?: A Little Help needed walking in hospital room?: A Little Help needed climbing 3-5 steps with a railing? : A Little 6 Click Score: 12    End of Session Equipment Utilized During Treatment: Gait belt Activity Tolerance: Patient tolerated treatment well Patient left: in chair;with call bell/phone within reach   PT Visit Diagnosis: Other abnormalities of gait and mobility (R26.89);Muscle weakness (generalized) (M62.81);Other symptoms and signs involving the nervous system (E02.233)     Time: 6122-4497 PT Time Calculation (min) (ACUTE ONLY): 16 min  Charges:  $Gait Training: 8-22 mins                    Field Staniszewski B. Kristan Votta, PT, DPT  Acute Rehabilitation [phone] pager #[phone] office   05/27/2018, 9:11 AM

## 2018-05-27 NOTE — Progress Notes (Addendum)
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 Sz 9/1. 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  pt sedated for burst suppression for 48 hours,  Seizure control achieved on 9/3. and sedation weaned 9/5.  Pt extubated 9/6.  PMhx: SAH s/p aneurysm coiling 02/2017, adjustment d/o, HTN, GERD.   OT comments  Pt progressing towards established OT goals. Pt highly motivated to to return to home and PLOF. Pt presenting with increased gross motor strength and coordination of RUE and continues to present with decreased FM skills and coordination impacting her functional use of her dominant hand.  Pt performing grooming tasks at sink with Min Guard A for safety and Min VCs for cognition. Pt continues to present with slow processing and requires cues during ADLs. Pt requiring Min A with single hand held A for functional mobility and presents with decreased balance. Pt with LOB requiring Min A for correction. Update dc recommend dc CIR for intensive OT to optimize safety and independence with ADLs, IADLs, and functional mobility. Will update goals and continue to follow acutely as admitted.    Follow Up Recommendations  CIR    Equipment Recommendations  Other (comment)(TBD)    Recommendations for Other Services Speech consult    Precautions / Restrictions Precautions Precautions: Fall Restrictions Weight Bearing Restrictions: No       Mobility Bed Mobility               General bed mobility comments: Pt was OOB in the recliner chair.   Transfers Overall transfer level: Needs assistance Equipment used: Rolling walker (2 wheeled) Transfers: Sit to/from Stand Sit to Stand: Min assist         General transfer comment: Min A for single hand  held A    Balance Overall balance assessment: Needs assistance Sitting-balance support: Feet supported;No upper extremity supported Sitting balance-Leahy Scale: Fair     Standing balance support: No upper extremity supported Standing balance-Leahy Scale: Fair                             ADL either performed or assessed with clinical judgement   ADL Overall ADL's : Needs assistance/impaired     Grooming: Oral care;Wash/dry face;Min guard;Standing Grooming Details (indicate cue type and reason): Pt performing grooming tasks at sink with Min Guard A for safety. Pt requiring increased time for processing and benefits from Min verbal cues to clean toothpaste from her mouth.                  Toilet Transfer: Minimal assistance;Ambulation(single handheld A simulated to recliner) Toilet Transfer Details (indicate cue type and reason): Min A for single handheld A simulated to recliner. Pt with LOB towards the left and required Min A for correction         Functional mobility during ADLs: Minimal assistance General ADL Comments: Pt performing functional mobility to bathroom with Mi nA for balance and safety. Pt performing grooming tasks at sink with Min Guard A and Min cues. Pt very motivated to return to PLOF.      Vision   Additional Comments: Pt denies any blurry or double vision. Reports she wears glasses   Perception     Praxis      Cognition Arousal/Alertness:  Awake/alert Behavior During Therapy: Flat affect(Smiling occationally) Overall Cognitive Status: Impaired/Different from baseline Area of Impairment: Problem solving;Following commands                       Following Commands: Follows multi-step commands with increased time;Follows multi-step commands inconsistently     Problem Solving: Slow processing General Comments: Pt slow moving and slow processing and benefits from increased cues. Pt reports she is remembering tdifferent events  better. At one point pt did state, "I stopped going to church recently beecause the last two years I have been here."         Exercises     Shoulder Instructions       General Comments VSS    Pertinent Vitals/ Pain       Pain Assessment: No/denies pain  Home Living                                          Prior Functioning/Environment              Frequency  Min 2X/week        Progress Toward Goals  OT Goals(current goals can now be found in the care plan section)  Progress towards OT goals: Progressing toward goals  Acute Rehab OT Goals Patient Stated Goal: able to do myself ADL Goals Pt Will Perform Grooming: with max assist;bed level Additional ADL Goal #1: Pt will perform bed mobility prior to ADL participation at mod A  Plan Discharge plan remains appropriate    Co-evaluation                 AM-PAC PT "6 Clicks" Daily Activity     Outcome Measure   Help from another person eating meals?: None Help from another person taking care of personal grooming?: A Little Help from another person toileting, which includes using toliet, bedpan, or urinal?: A Little Help from another person bathing (including washing, rinsing, drying)?: A Little Help from another person to put on and taking off regular upper body clothing?: A Little Help from another person to put on and taking off regular lower body clothing?: A Little 6 Click Score: 19    End of Session    OT Visit Diagnosis: Muscle weakness (generalized) (M62.81);Other symptoms and signs involving cognitive function   Activity Tolerance Patient tolerated treatment well   Patient Left with call bell/phone within reach;in chair   Nurse Communication Mobility status        Time: 8921-1941 OT Time Calculation (min): 16 min  Charges: OT General Charges $OT Visit: 1 Visit OT Treatments $Self Care/Home Management : 8-22 mins  [place], OTR/L Acute Rehab Pager:  [phone] Office: [place] 05/27/2018, 10:52 AM

## 2018-05-27 NOTE — Progress Notes (Addendum)
Neurosurgery Progress Note  No issues overnight.  Feels well this am with minimal HA Denies focal deficits  EXAM:  BP (!) 141/87 (BP Location: Left Arm)   Pulse 88   Temp 98.7 F (37.1 C) (Oral)   Resp 15   Ht 5\' 4"  (1.626 m)   Wt 92 kg   SpO2 100%   BMI 34.81 kg/m   Awake, alert, oriented  Speech appropriate CN grossly intact MAEW with good strength No focal deficits  IMPRESSION/PLAN IMPRESSION:  47 y.o. female s/p aneurysm embolization, postop status. Now on 4 AEDs, appears to be doing well. Doing well this am - Because of improvement, patient may not be a candidate for CIR. Will have therapy reassess today. Pending recs, she may be able to d/c home. - Continue AEDs per neurology

## 2018-05-27 NOTE — Progress Notes (Signed)
Reviewed PT/OT notes. Patient will with difficulties with mobility and ADLs. AC for CIR to submit for insurance authorization. Patient is stable for d/c once approval is obtained should a bed be available.

## 2018-05-27 NOTE — Progress Notes (Signed)
Report received from off going Dealer.

## 2018-05-28 ENCOUNTER — Inpatient Hospital Stay (HOSPITAL_COMMUNITY)
Admission: RE | Admit: 2018-05-28 | Discharge: 2018-06-02 | DRG: 092 | Disposition: A | Discharge status: Home Health | Payer: 59 | Source: Intra-hospital | Attending: Physical Medicine & Rehabilitation | Admitting: Physical Medicine & Rehabilitation

## 2018-05-28 ENCOUNTER — Encounter (HOSPITAL_COMMUNITY): Payer: Self-pay | Admitting: *Deleted

## 2018-05-28 ENCOUNTER — Other Ambulatory Visit: Payer: Self-pay

## 2018-05-28 DIAGNOSIS — Z8349 Family history of other endocrine, nutritional and metabolic diseases: Secondary | ICD-10-CM

## 2018-05-28 DIAGNOSIS — Z6833 Body mass index (BMI) 33.0-33.9, adult: Secondary | ICD-10-CM

## 2018-05-28 DIAGNOSIS — Z833 Family history of diabetes mellitus: Secondary | ICD-10-CM | POA: Diagnosis not present

## 2018-05-28 DIAGNOSIS — R569 Unspecified convulsions: Secondary | ICD-10-CM

## 2018-05-28 DIAGNOSIS — E6609 Other obesity due to excess calories: Secondary | ICD-10-CM | POA: Diagnosis not present

## 2018-05-28 DIAGNOSIS — E669 Obesity, unspecified: Secondary | ICD-10-CM | POA: Diagnosis present

## 2018-05-28 DIAGNOSIS — K219 Gastro-esophageal reflux disease without esophagitis: Secondary | ICD-10-CM | POA: Diagnosis present

## 2018-05-28 DIAGNOSIS — D649 Anemia, unspecified: Secondary | ICD-10-CM | POA: Diagnosis present

## 2018-05-28 DIAGNOSIS — I69019 Unspecified symptoms and signs involving cognitive functions following nontraumatic subarachnoid hemorrhage: Secondary | ICD-10-CM

## 2018-05-28 DIAGNOSIS — R414 Neurologic neglect syndrome: Secondary | ICD-10-CM | POA: Diagnosis present

## 2018-05-28 DIAGNOSIS — Z800 Family history of malignant neoplasm of digestive organs: Secondary | ICD-10-CM | POA: Diagnosis not present

## 2018-05-28 DIAGNOSIS — D62 Acute posthemorrhagic anemia: Secondary | ICD-10-CM | POA: Diagnosis not present

## 2018-05-28 DIAGNOSIS — E8809 Other disorders of plasma-protein metabolism, not elsewhere classified: Secondary | ICD-10-CM | POA: Diagnosis present

## 2018-05-28 DIAGNOSIS — E46 Unspecified protein-calorie malnutrition: Secondary | ICD-10-CM | POA: Diagnosis not present

## 2018-05-28 DIAGNOSIS — I10 Essential (primary) hypertension: Secondary | ICD-10-CM | POA: Diagnosis present

## 2018-05-28 DIAGNOSIS — R030 Elevated blood-pressure reading, without diagnosis of hypertension: Secondary | ICD-10-CM

## 2018-05-28 DIAGNOSIS — R5381 Other malaise: Secondary | ICD-10-CM

## 2018-05-28 DIAGNOSIS — R2689 Other abnormalities of gait and mobility: Principal | ICD-10-CM | POA: Diagnosis present

## 2018-05-28 DIAGNOSIS — R531 Weakness: Secondary | ICD-10-CM | POA: Diagnosis present

## 2018-05-28 DIAGNOSIS — G40909 Epilepsy, unspecified, not intractable, without status epilepticus: Secondary | ICD-10-CM | POA: Diagnosis present

## 2018-05-28 DIAGNOSIS — Z8249 Family history of ischemic heart disease and other diseases of the circulatory system: Secondary | ICD-10-CM | POA: Diagnosis not present

## 2018-05-28 DIAGNOSIS — Z7902 Long term (current) use of antithrombotics/antiplatelets: Secondary | ICD-10-CM

## 2018-05-28 DIAGNOSIS — R269 Unspecified abnormalities of gait and mobility: Secondary | ICD-10-CM

## 2018-05-28 DIAGNOSIS — I609 Nontraumatic subarachnoid hemorrhage, unspecified: Secondary | ICD-10-CM

## 2018-05-28 DIAGNOSIS — Z7982 Long term (current) use of aspirin: Secondary | ICD-10-CM | POA: Diagnosis not present

## 2018-05-28 DIAGNOSIS — Z8679 Personal history of other diseases of the circulatory system: Secondary | ICD-10-CM | POA: Diagnosis not present

## 2018-05-28 LAB — CBC
HEMATOCRIT: 34.8 % — AB (ref 36.0–46.0)
HEMOGLOBIN: 10.9 g/dL — AB (ref 12.0–15.0)
MCH: 26.8 pg (ref 26.0–34.0)
MCHC: 31.3 g/dL (ref 30.0–36.0)
MCV: 85.7 fL (ref 78.0–100.0)
PLATELETS: 319 10*3/uL (ref 150–400)
RBC: 4.06 MIL/uL (ref 3.87–5.11)
RDW: 14 % (ref 11.5–15.5)
WBC: 6 10*3/uL (ref 4.0–10.5)

## 2018-05-28 LAB — CBC WITH DIFFERENTIAL/PLATELET
Abs Immature Granulocytes: 0 10*3/uL (ref 0.0–0.1)
BASOS PCT: 1 %
Basophils Absolute: 0 10*3/uL (ref 0.0–0.1)
EOS ABS: 0.1 10*3/uL (ref 0.0–0.7)
EOS PCT: 2 %
HCT: 32.7 % — ABNORMAL LOW (ref 36.0–46.0)
Hemoglobin: 10.5 g/dL — ABNORMAL LOW (ref 12.0–15.0)
Immature Granulocytes: 1 %
Lymphocytes Relative: 38 %
Lymphs Abs: 2.2 10*3/uL (ref 0.7–4.0)
MCH: 27.7 pg (ref 26.0–34.0)
MCHC: 32.1 g/dL (ref 30.0–36.0)
MCV: 86.3 fL (ref 78.0–100.0)
MONO ABS: 0.7 10*3/uL (ref 0.1–1.0)
MONOS PCT: 13 %
NEUTROS ABS: 2.6 10*3/uL (ref 1.7–7.7)
Neutrophils Relative %: 45 %
PLATELETS: 281 10*3/uL (ref 150–400)
RBC: 3.79 MIL/uL — ABNORMAL LOW (ref 3.87–5.11)
RDW: 13.9 % (ref 11.5–15.5)
WBC: 5.7 10*3/uL (ref 4.0–10.5)

## 2018-05-28 LAB — GLUCOSE, CAPILLARY
GLUCOSE-CAPILLARY: 114 mg/dL — AB (ref 70–99)
Glucose-Capillary: 97 mg/dL (ref 70–99)

## 2018-05-28 LAB — CREATININE, SERUM
Creatinine, Ser: 0.87 mg/dL (ref 0.44–1.00)
GFR calc non Af Amer: >60 mL/min (ref >60)

## 2018-05-28 LAB — PHENYTOIN LEVEL, TOTAL: Phenytoin Lvl: 4.3 ug/mL — ABNORMAL LOW (ref 10.0–20.0)

## 2018-05-28 MED ORDER — ADULT MULTIVITAMIN W/MINERALS CH
1.0000 | ORAL_TABLET | Freq: Every day | ORAL | Status: DC
  Administered 2018-05-29 – 2018-06-02 (×5): 1 via ORAL
  Filled 2018-05-28 (×5): qty 1

## 2018-05-28 MED ORDER — LACOSAMIDE 200 MG PO TABS: 200.0000 mg | ORAL_TABLET | Freq: Two times a day (BID) | ORAL | 2 refills | Status: DC

## 2018-05-28 MED ORDER — ACETAMINOPHEN 325 MG PO TABS
650.0000 mg | ORAL_TABLET | Freq: Four times a day (QID) | ORAL | Status: DC | PRN
  Administered 2018-06-01: 650 mg via ORAL
  Filled 2018-05-28: qty 2

## 2018-05-28 MED ORDER — OXCARBAZEPINE 150 MG PO TABS: 600.0000 mg | ORAL_TABLET | Freq: Two times a day (BID) | ORAL | 2 refills | Status: DC

## 2018-05-28 MED ORDER — ONDANSETRON HCL 4 MG PO TABS: 4.0000 mg | ORAL_TABLET | ORAL | Status: DC | PRN

## 2018-05-28 MED ORDER — HEPARIN SODIUM (PORCINE) 5000 UNIT/ML IJ SOLN
5000.0000 [IU] | Freq: Three times a day (TID) | INTRAMUSCULAR | Status: DC
  Administered 2018-05-28 – 2018-06-02 (×14): 5000 [IU] via SUBCUTANEOUS
  Filled 2018-05-28 (×14): qty 1

## 2018-05-28 MED ORDER — SODIUM CHLORIDE 0.9% FLUSH
10.0000 mL | INTRAVENOUS | Status: DC | PRN
  Administered 2018-05-30: 30 mL
  Filled 2018-05-28: qty 40

## 2018-05-28 MED ORDER — ONDANSETRON HCL 4 MG/2ML IJ SOLN: 4.0000 mg | INTRAMUSCULAR | Status: DC | PRN

## 2018-05-28 MED ORDER — HEPARIN SODIUM (PORCINE) 5000 UNIT/ML IJ SOLN: 5000.0000 [IU] | Freq: Three times a day (TID) | INTRAMUSCULAR | Status: DC

## 2018-05-28 MED ORDER — LEVETIRACETAM 500 MG PO TABS
1500.0000 mg | ORAL_TABLET | Freq: Two times a day (BID) | ORAL | Status: DC
  Administered 2018-05-28 – 2018-06-02 (×10): 1500 mg via ORAL
  Filled 2018-05-28 (×10): qty 3

## 2018-05-28 MED ORDER — OXCARBAZEPINE 300 MG PO TABS
750.0000 mg | ORAL_TABLET | Freq: Two times a day (BID) | ORAL | Status: DC
  Administered 2018-05-28: 750 mg via ORAL
  Filled 2018-05-28: qty 1

## 2018-05-28 MED ORDER — CEFAZOLIN SODIUM-DEXTROSE 2-4 GM/100ML-% IV SOLN
2.0000 g | Freq: Three times a day (TID) | INTRAVENOUS | Status: AC
  Administered 2018-05-28 – 2018-05-30 (×6): 2 g via INTRAVENOUS
  Filled 2018-05-28 (×7): qty 100

## 2018-05-28 MED ORDER — PANTOPRAZOLE SODIUM 40 MG PO TBEC
40.0000 mg | DELAYED_RELEASE_TABLET | Freq: Every day | ORAL | Status: DC
  Administered 2018-05-29 – 2018-06-02 (×5): 40 mg via ORAL
  Filled 2018-05-28 (×5): qty 1

## 2018-05-28 MED ORDER — HYDROCODONE-ACETAMINOPHEN 5-325 MG PO TABS: 1.0000 | ORAL_TABLET | ORAL | Status: DC | PRN

## 2018-05-28 MED ORDER — LEVETIRACETAM 750 MG PO TABS: 1500.0000 mg | ORAL_TABLET | Freq: Two times a day (BID) | ORAL | 2 refills | Status: DC

## 2018-05-28 MED ORDER — ASPIRIN 325 MG PO TABS
325.0000 mg | ORAL_TABLET | Freq: Every day | ORAL | Status: DC
  Administered 2018-05-29 – 2018-06-02 (×5): 325 mg via ORAL
  Filled 2018-05-28 (×5): qty 1

## 2018-05-28 MED ORDER — CLOPIDOGREL BISULFATE 75 MG PO TABS
75.0000 mg | ORAL_TABLET | Freq: Every day | ORAL | Status: DC
  Administered 2018-05-29 – 2018-06-02 (×5): 75 mg via ORAL
  Filled 2018-05-28 (×5): qty 1

## 2018-05-28 MED ORDER — OXCARBAZEPINE 300 MG PO TABS
750.0000 mg | ORAL_TABLET | Freq: Two times a day (BID) | ORAL | Status: DC
  Administered 2018-05-28 – 2018-06-02 (×10): 750 mg via ORAL
  Filled 2018-05-28 (×11): qty 1

## 2018-05-28 MED ORDER — LACOSAMIDE 200 MG PO TABS
200.0000 mg | ORAL_TABLET | Freq: Two times a day (BID) | ORAL | Status: DC
  Administered 2018-05-28 – 2018-06-02 (×10): 200 mg via ORAL
  Filled 2018-05-28 (×10): qty 1

## 2018-05-28 NOTE — IPOC Note (Signed)
Overall Plan of Care [facility]) Patient Details Name: Carly [name] MRN: [id number] DOB: January 12, 1971  Admitting Diagnosis: Seizures  Hospital Problems: Active Problems:   SAH (subarachnoid hemorrhage) (HCC)   Seizures ([place])   Debility   Hypoalbuminemia due to protein-calorie malnutrition ([place])     Functional Problem List: Nursing Endurance, Motor, Pain, Safety, Skin Integrity  PT Balance, Endurance, Motor, Safety  OT Balance, Endurance  SLP    TR         Basic ADL's: OT Grooming, Bathing, Toileting     Advanced  ADL's: OT Light Housekeeping, Simple Meal Preparation     Transfers: PT Bed Mobility, Bed to Chair, Car, [employer], Editor, commissioning, Agricultural engineer: PT Ambulation, Emergency planning/management officer, Stairs     Additional Impairments: OT    SLP        TR      Anticipated Outcomes Item Anticipated Outcome  Self Feeding no goal  Swallowing      Basic self-care  MOD I  Toileting  MOD I   Bathroom Transfers MOD I  Bowel/Bladder  Continent to bladder and bowel with min. assist.  Transfers  Mod I with LRAD   Locomotion  Mod I ambulatory with LRAD   Communication     Cognition     Pain  Less than 3,on 1 to 10 scale.  Safety/Judgment  Free from falls during her stay in rehab.   Therapy Plan: PT Intensity: Minimum of 1-2 x/day ,45 to 90 minutes PT Frequency: 5 out of 7 days PT Duration Estimated Length of Stay: 3-5 days  OT Intensity: Minimum of 1-2 x/day, 45 to 90 minutes OT Frequency: 5 out of 7 days OT Duration/Estimated Length of Stay: 3-5      Team Interventions: Nursing Interventions Patient/Family Education, Disease Management/Prevention, Skin Care/Wound Management, Discharge Planning, Psychosocial Support  PT interventions Ambulation/gait training, Training and development officer, Community reintegration, Disease management/prevention, Discharge planning, DME/adaptive equipment instruction, Functional mobility training, Psychosocial support,  Stair training, Splinting/orthotics, Therapeutic Activities, Therapeutic Exercise, Visual/perceptual remediation/compensation, Patient/family education, UE/LE Coordination activities, UE/LE Strength taining/ROM, Wheelchair propulsion/positioning  OT Interventions Training and development officer, Discharge planning, Self Care/advanced ADL retraining, Therapeutic Activities, UE/LE Coordination activities, Cognitive remediation/compensation, Disease mangement/prevention, Functional mobility training, Patient/family education, Therapeutic Exercise, Community reintegration, Engineer, drilling, Neuromuscular re-education, Psychosocial support, UE/LE Strength taining/ROM  SLP Interventions    TR Interventions    SW/CM Interventions Discharge Planning, Psychosocial Support, Patient/Family Education   Barriers to Discharge MD  Medical stability  Nursing      PT Decreased caregiver support, Home environment Child psychotherapist    OT      SLP      SW       Team Discharge Planning: Destination: PT-Home ,OT- Home , SLP-Home Projected Follow-up: PT-Home health PT, OT-  Home health OT, SLP-None Projected Equipment Needs: PT-To be determined, OT- None recommended by OT, SLP-None recommended by SLP Equipment Details: PT-has RW and WC, OT-  Patient/family involved in discharge planning: PT- Patient,  OT-Patient, SLP-Patient, Family member/caregiver  MD ELOS: 2-4 days. Medical Rehab Prognosis:  Excellent Assessment: 47 year old right-handed female with history of subarachnoid hemorrhage underwent coil embolization of supraclinoid left internal carotid artery aneurysm June 2018 and received inpatient rehab services 7/25/2018until 04/23/2017. Patient recently underwent follow-up diagnostic cerebral angiogram that showed enlargement of residual aneurysm. She was admitted 05/15/2018 and underwent embolization of residual aneurysm 05/15/2018 per Dr. Kathyrn Sheriff. Postoperative seizure right side weakness and  neglect. Received additional 500 mg of Keppra as she was  already on at thousand milligrams twice daily. Initial full cranial CT scan reviewed, unremarkable for acute intracranial process. EEG showed at least 4 left hemispheric subclinical seizures recorded. MRI of the brain showed small region of cortical T2 flair hyperintensity centered within the left posterior temporal lobe with a few areas of reduced diffusion. Findings probably represented seizure-like activity. 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 Presently on Plavix and aspirin for CVA prophylaxisas prior to admission. She continues on Vimpat, Keppra as well as Trileptal for seizure disorder. Patient with resulting functional deficits with mobility, endurance, self-care. Will set goals for Mod I with PT/OT.  See Team Conference Notes for weekly updates to the plan of care

## 2018-05-28 NOTE — Progress Notes (Signed)
PMR Admission Coordinator Pre-Admission Assessment  Patient: Carly [name] is an 47 y.o., female MRN: [id number] DOB: 05/20/1971 Height: '5\' 4"'  (162.6 cm) Weight: 89.2 kg                                                                                                                                                  Insurance Information HMO:    PPO: Yes     PCP:      IPA:      80/20:      OTHER:  PRIMARY: [employer] Commercial     Policy#: [id number]      Subscriber: Spouse Shiela Mayer)  CM Name: Rudene Re      Phone#: [phone]     Fax#: [phone] Josem Kaufmann was provided by Danton Sewer at [facility] for admit to CIR today. Clinical updates are due in 7 days to [facility].  Pre-Cert#: [id number]      Employer:  Benefits:  Phone #: NA     Name: [email or web address]  Eff. Date: 09/16/17     Deduct: $0      Out of Pocket Max: $6,550 (met $5,597.35)      Life Max: NA CIR: 80% coverage with insurance approval      SNF: 80%/20% with 120 day limit Outpatient: 80%; 60 combined all therapies (PT/OT/SLP/Card.pulm)     Co-Pay: 20% Home Health: 80%; 120 visits/cal year (4 hours or less each)      Co-Pay: 20% DME: 80%    Co-Pay: 20% Providers:  SECONDARY:       Policy#:       Subscriber:  CM Name:       Phone#:      Fax#:  Pre-Cert#:       Employer:  Benefits:  Phone #:      Name:  Eff. 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 Per chart  review patient lives with spouse and 62 year old daughter in [place]. Independent prior to admission. One level home with 3 steps to entry. Patient recently underwent follow-up diagnostic cerebral angiogram that showed enlargement of residual aneurysm. She was admitted 05/15/2018 and underwent embolization of residual aneurysm 05/15/2018 per Dr. Kathyrn Sheriff. Postoperative seizure right side weakness and neglect. Received additional 500 mg of Keppra as she was already on at thousand milligrams twice daily. Initial fall cranial CT scan reviewed, unremarkable for acute intracranial process. EEG showed at least 4 left hemispheric subclinical seizures recorded. MRI of the brain showed small region of cortical T2 flair hyperintensity centered within the left posterior temporal lobe with a few areas of reduced diffusion. Findings probably represented seizure-like activity. 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 Presently on Plavix and aspirin for CVA prophylaxisas prior to admission. Subcutaneous heparin for DVT prophylaxisinitiated 05/20/2018. She continues on Vimpat, Keppra as well as Trileptal for seizure disorder. Therapy evaluations completed with recommendations of physical medicine rehab consult. Patient is to be admitted for a comprehensive rehab program on 05/28/18.  Past Medical History      Past Medical History:  Diagnosis Date  . GERD (gastroesophageal reflux disease)   . Hypertension   . Memory difficulties    short term memory  . Migraines   . Seizures ([place])    with hemorrhage  . Subarachnoid hemorrhage ([facility])    d/t burst aneurysm, had seizures    Family History  family history includes  Cancer in her father; Diabetes in her maternal grandmother and mother; Hyperlipidemia in her mother and paternal grandfather; Hypertension in her mother.  Prior Rehab/Hospitalizations:  Has the patient had major surgery during 100 days prior to admission? No  Current Medications   Current Facility-Administered Medications:  .  0.9 %  sodium chloride infusion, , Intravenous, Continuous, Erick Colace, NP, Stopped at 05/28/18 0847 .  acetaminophen (TYLENOL) tablet 650 mg, 650 mg, Oral, Q6H PRN, Consuella Lose, MD, 650 mg at 05/26/18 2104 .  aspirin tablet 325 mg, 325 mg, Oral, Daily, Consuella Lose, MD, 325 mg at 05/28/18 1001 .  ceFAZolin (ANCEF) IVPB 2g/100 mL premix, 2 g, Intravenous, Q8H, Erick Colace, NP, Last Rate: 200 mL/hr at 05/28/18 0557, 2 g at 05/28/18 0557 .  Chlorhexidine Gluconate Cloth 2 % PADS 6 each, 6 each, Topical, Daily, Consuella Lose, MD, 6 each at 05/28/18 1001 .  clopidogrel (PLAVIX) tablet 75 mg, 75 mg, Oral, Daily, Consuella Lose, MD, 75 mg at 05/28/18 1000 .  feeding supplement (ENSURE ENLIVE) (ENSURE ENLIVE) liquid 237 mL, 237 mL, Oral, BID BM, Consuella Lose, MD, 237 mL at 05/27/18 2158 .  heparin injection 5,000 Units, 5,000 Units, Subcutaneous, Q8H, Agarwala, Ravi, MD, 5,000 Units at 05/28/18 0500 .  HYDROcodone-acetaminophen (NORCO/VICODIN) 5-325 MG per tablet 1 tablet, 1 tablet, Oral, Q4H PRN, Consuella Lose, MD .  ibuprofen (ADVIL,MOTRIN) tablet 400 mg, 400 mg, Oral, Q6H PRN, Consuella Lose, MD .  labetalol (NORMODYNE,TRANDATE) injection 10-40 mg, 10-40 mg, Intravenous, Q10 min PRN, Consuella Lose, MD, 20 mg at 05/22/18 1716 .  lacosamide (VIMPAT) tablet 200 mg, 200 mg, Oral, BID, Amie Portland, MD, 200 mg at 05/28/18 1000 .  levETIRAcetam (KEPPRA) tablet 1,500 mg, 1,500 mg, Oral, BID, Amie Portland, MD, 1,500 mg at 05/28/18 1001 .  LORazepam (ATIVAN) injection 1-2 mg, 1-2 mg, Intravenous, Q4H PRN, Erline Levine, MD, 2 mg  at 05/17/18  1502 .  morphine 2 MG/ML injection 1-2 mg, 1-2 mg, Intravenous, Q1H PRN, Erline Levine, MD, 2 mg at 05/24/18 0843 .  multivitamin with minerals tablet 1 tablet, 1 tablet, Oral, Daily, Consuella Lose, MD, 1 tablet at 05/28/18 1001 .  ondansetron (ZOFRAN) tablet 4 mg, 4 mg, Oral, Q4H PRN **OR** ondansetron (ZOFRAN) injection 4 mg, 4 mg, Intravenous, Q4H PRN, Consuella Lose, MD .  OXcarbazepine (TRILEPTAL) tablet 750 mg, 750 mg, Oral, BID, Amie Portland, MD, 750 mg at 05/28/18 1000 .  pantoprazole (PROTONIX) EC tablet 40 mg, 40 mg, Oral, Daily, Consuella Lose, MD, 40 mg at 05/28/18 1001 .  sodium chloride flush (NS) 0.9 % injection 10-40 mL, 10-40 mL, Intracatheter, Q12H, Consuella Lose, MD, 10 mL at 05/28/18 1001 .  sodium chloride flush (NS) 0.9 % injection 10-40 mL, 10-40 mL, Intracatheter, PRN, Consuella Lose, MD  Patients Current Diet:     Diet Order                  Diet general         DIET DYS 3 Room service appropriate? Yes; Fluid consistency: Thin  Diet effective now               Precautions / Restrictions Precautions Precautions: Fall Restrictions Weight Bearing Restrictions: No   Has the patient had 2 or more falls or a fall with injury in the past year?No  Prior Activity Level Community (5-7x/wk): driving, on disability in terms of job, but active  Development worker, international aid / Equipment  Prior Device Use: Indicate devices/aids used by the patient prior to current illness, exacerbation or injury? Walker  Prior Functional Level Prior Function Comments: no family available to ascertain PLOF and pt unable to relate  Self Care: Did the patient need help bathing, dressing, using the toilet or eating?  Independent  Indoor Mobility: Did the patient need assistance with walking from room to room (with or without device)? Independent  Stairs: Did the patient need assistance with internal or external stairs (with or without  device)? Independent  Functional Cognition: Did the patient need help planning regular tasks such as shopping or remembering to take medications? Independent  Current Functional Level Cognition  Overall Cognitive Status: Impaired/Different from baseline Orientation Level: Oriented X4 Following Commands: Follows multi-step commands with increased time, Follows multi-step commands inconsistently General Comments: Continues to present with decreased processing    Extremity Assessment (includes Sensation/Coordination)  Upper Extremity Assessment: RUE deficits/detail RUE Deficits / Details: Pt with significant increased in gross motor strength and coordination. Pt continues to present with decreased FM coorindation. Pt with difficulty with ifnger dexerity and performing finger opposition. RUE: (cognition impacting assessment) RUE Sensation: decreased light touch RUE Coordination: decreased fine motor LUE Deficits / Details: no movement noted, did not pull away from nail bed pressure LUE Sensation: decreased light touch, decreased proprioception LUE Coordination: decreased fine motor, decreased gross motor  Lower Extremity Assessment: Defer to PT evaluation RLE Deficits / Details: no spontaneous movements noted LLE Deficits / Details: no spontaneous movement    ADLs  Overall ADL's : Needs assistance/impaired Grooming: Oral care, Wash/dry face, Min guard, Standing Grooming Details (indicate cue type and reason): Pt performing grooming tasks at sink with [employer] A for safety. Pt requiring increased time for processing and benefits from Min verbal cues to clean toothpaste from her mouth.  Toilet Transfer: Minimal assistance, Ambulation, Regular Toilet Toilet Transfer Details (indicate cue type and reason): Family requesting assistance for toilet due to  pt needing to use restroom. Min A for single hand held.  Functional mobility during ADLs: Minimal assistance General ADL Comments: Upon  arrival, pt requesting to use restroom. Pt stating she needs to use bathroom questions and has already started. Continues to require min A.    Mobility  Overal bed mobility: Needs Assistance Bed Mobility: Supine to Sit, Sit to Supine Supine to sit: Min assist Sit to supine: Min assist General bed mobility comments: Pt was OOB in the recliner chair.     Transfers  Overall transfer level: Needs assistance Equipment used: 1 person hand held assist Transfers: Sit to/from Stand Sit to Stand: Min assist General transfer comment: Min A for single hand held A    Ambulation / Gait / Stairs / Emergency planning/management officer  Ambulation/Gait Ambulation/Gait assistance: Min guard, Herbalist (Feet): 120 Feet Assistive device: Rolling walker (2 wheeled), 1 person hand held assist Gait Pattern/deviations: Step-through pattern(slow) General Gait Details: Pt with slow, guarded gait, min guard assist with RW, when asked to walk without RW, pt was min hand held assist for balance.  She prefers RW, but I educated her that PT will start doing things to make her a bit unsteady/uncomfortable to help make her better/more independent.  Gait speed significantly slowed without RW as well.  Gait velocity: 1.02 ft/sec with RW Gait velocity interpretation: <1.31 ft/sec, indicative of household ambulator    Posture / Balance Dynamic Sitting Balance Sitting balance - Comments: difficulty maintaining balance donning socks at EOB Balance Overall balance assessment: Needs assistance Sitting-balance support: Feet supported, No upper extremity supported Sitting balance-Leahy Scale: Fair Sitting balance - Comments: difficulty maintaining balance donning socks at EOB Standing balance support: No upper extremity supported Standing balance-Leahy Scale: Fair Standing balance comment: static only, otherwise needs minimal external support    Special needs/care consideration BiPAP/CPAP: no CPM: no Continuous  Drip IV: ceFAZolin (ANCEF); 0/9% Sodium Chloride infusion  Dialysis: no        Days: no Life Vest: no Oxygen: no Special Bed: no Trach Size: no Wound Vac (area): no      Location: no Skin: surgical incision of right groin                         Bowel mgmt: last BM 05/25/18.  Bladder mgmt: continent, urgency listed in chart Diabetic mgmt: no     Previous Home Environment Living Arrangements: Spouse/significant other, Children Additional Comments: no family present, Pt unable to answer details  Discharge Living Setting Plans for Discharge Living Setting: Patient's home, Lives with (comment)(with husband and 60 yo daugther) Type of Home at Discharge: House Discharge Home Layout: One level Discharge Home Access: Stairs to enter Entrance Stairs-Rails: Left Entrance Stairs-Number of Steps: 3 Discharge Bathroom Shower/Tub: Tub/shower unit Discharge Bathroom Toilet: Standard Discharge Bathroom Accessibility: Yes How Accessible: Accessible via walker Does the patient have any problems obtaining your medications?: No  Social/Family/Support Systems Patient Roles: Spouse, Parent Contact Information: spouse is emergency contact Anticipated Caregiver: spouse [name]) and daugther  Anticipated Caregiver's Contact Information: [name] [phone] Ability/Limitations of Caregiver: 24/7 physical assist as needed Caregiver Availability: 24/7 Discharge Plan Discussed with Primary Caregiver: Yes Is Caregiver In Agreement with Plan?: Yes Does Caregiver/Family have Issues with Lodging/Transportation while Pt is in Rehab?: No   Goals/Additional Needs Patient/Family Goal for Rehab: PT/OT: Mod I/Supervision; SLP: NA Expected length of stay: 5-8 days Cultural Considerations: NA Dietary Needs: DYS 3, thin liquids Equipment Needs: TBD Pt/Family Agrees to  Admission and willing to participate: Yes Program Orientation Provided & Reviewed with Pt/Caregiver Including Roles  & Responsibilities:  Yes  Barriers to Discharge: Home environment access/layout, Inaccessible home environment  Barriers to Discharge Comments: 3 steps to enter; tub shower.    Decrease burden of Care through IP rehab admission: NA   Possible need for SNF placement upon discharge: Not anticipated; pt has good social support and good prognosis for further progress.    Patient Condition: This patient's medical and functional status has changed since the consult dated: 05/26/18 in which the Rehabilitation Physician determined and documented that the patient's condition is appropriate for intensive rehabilitative care in an inpatient rehabilitation facility. See "History of Present Illness" (above) for medical update. Functional changes are: Min A 80 feet to Min G/Min A 120 feet with RW. Patient's medical and functional status update has been discussed with the Rehabilitation physician and patient remains appropriate for inpatient rehabilitation. Will admit to inpatient rehab today.  Preadmission Screen Completed By:  Jhonnie Garner, 05/28/2018 12:51 PM ______________________________________________________________________   Discussed status with Dr. Letta Pate on 05/28/18 at 12:51PM and received telephone approval for admission today.  Admission Coordinator:  Jhonnie Garner, time 12:51PM/Date 05/28/18.           Cosigned by: Charlett Blake, MD at 05/28/2018 1:10 PM  Revision History

## 2018-05-28 NOTE — Progress Notes (Signed)
Inpatient Rehabilitation-Admissions Coordinator   [facility] has just received insurance approval and medical approval for admit to CIR today. AC will update pt, family, floor RN, CM and SW regarding plan.   Please call if questions.   Jhonnie Garner, OTR/L  Rehab Admissions Coordinator  [phone] 05/28/2018 11:57 AM

## 2018-05-28 NOTE — Progress Notes (Signed)
Chart review note  Phenytoin now subtherapeutic. Decreased Oxcarb to 750 BID. At discharge, this should be reduced to 600 BID.  Please call with questions   -- Amie Portland, MD [facility] Pager: [phone] If 7pm to 7am, please call on call as listed on AMION.

## 2018-05-28 NOTE — Care Management Note (Signed)
Case Management Note Previous CM note completed by Ella Bodo, RN 05/20/2018, 4:57 PM   Patient Details  Name: Carly [name] MRN: [id number] Date of Birth: 02/13/1971  Subjective/Objective:  The patient is a 47 year old woman who developed status epilepticus following elective coiling of a L ICA aneurysm.  PTA, pt resided at home with her spouse, [name].  The patient is known to [place] from a previous admission.                  Action/Plan: Pt currently remains sedated and on ventilator.  Will follow for discharge planning as pt progresses.  Expected Discharge Date:  05/28/18               Expected Discharge Plan:  [place]  In-House Referral:     Discharge planning Services  CM Consult  Post Acute Care Choice:  IP Rehab Choice offered to:     DME Arranged:    DME Agency:     HH Arranged:    [place] Agency:     Status of Service:  Completed, signed off  If discussed at [employer] of Stay Meetings, dates discussed:    Discharge Disposition: [facility] IP rehab   Additional Comments:  05/28/18- 1430- Marvetta Gibbons RN, CM- notified by Claiborne Billings with [facility] IP rehab- pt has bed available and insurance approval  today and will transition to IP rehab later this afternoon.   Dawayne Patricia, RN 05/28/2018, 2:33 PM 4NP cross coverage [phone]

## 2018-05-28 NOTE — H&P (Signed)
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  Per chart review patient lives with spouse and 28 year old daughter in [place].  Independent prior to admission.  One level home with 3 steps to entry.  Patient recently underwent follow-up diagnostic cerebral angiogram that showed enlargement of residual aneurysm.  She was admitted 05/15/2018 and underwent embolization of residual aneurysm 05/15/2018 per Dr. Kathyrn Sheriff.  Postoperative seizure right side weakness and neglect.  Received additional 500 mg of Keppra as she was already on at thousand milligrams twice daily.  Initial fall cranial CT scan reviewed, unremarkable for acute intracranial process.  EEG showed at least 4 left hemispheric subclinical seizures recorded.  MRI of the brain showed small region of cortical T2 flair hyperintensity centered within the left posterior temporal lobe with a few areas of reduced diffusion.  Findings probably represented seizure-like activity.  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  Presently on Plavix and aspirin for CVA prophylaxis as prior to admission.  Subcutaneous heparin for DVT prophylaxis initiated 05/20/2018.  She continues on Vimpat, Keppra as well as Trileptal for seizure disorder.  Therapy evaluations completed with recommendations of physical medicine rehab consult.  Patient was admitted for a comprehensive rehab program.   Review of Systems  Constitutional: Negative for chills and fever.  HENT: Negative for hearing loss.   Eyes: Negative for blurred vision and double vision.  Cardiovascular: Negative for chest pain.   Gastrointestinal: Positive for nausea and vomiting.       GERD  Genitourinary: Negative for dysuria, flank pain and hematuria.  Musculoskeletal: Positive for myalgias.  Skin: Negative for rash.  Neurological: Positive for seizures and headaches.  Psychiatric/Behavioral: Positive for memory loss.  All other systems reviewed and are negative.       Past Medical History:  Diagnosis Date  . GERD (gastroesophageal reflux disease)    . Hypertension    . Memory difficulties      short term memory  . Migraines    . Seizures ([facility])      with hemorrhage  . Subarachnoid hemorrhage ([facility])      d/t burst aneurysm, had seizures         Past Surgical History:  Procedure Laterality Date  . CESAREAN SECTION      . ESOPHAGOGASTRODUODENOSCOPY N/A 04/01/2017    Procedure: ESOPHAGOGASTRODUODENOSCOPY (EGD);  Surgeon: Georganna Skeans, MD;  Location: [facility];  Service: General;  Laterality: N/A;  . IR ANGIO INTRA EXTRACRAN SEL INTERNAL CAROTID UNI L MOD SED   05/15/2018  . IR ANGIO INTRA EXTRACRAN SEL INTERNAL CAROTID UNI R MOD SED   03/03/2017  . IR ANGIO VERTEBRAL SEL SUBCLAVIAN INNOMINATE BILAT MOD SED   02/10/2018  . IR ANGIO VERTEBRAL SEL VERTEBRAL BILAT MOD SED   03/03/2017  . IR ANGIO VERTEBRAL SEL VERTEBRAL BILAT MOD SED   02/10/2018  . IR ANGIOGRAM FOLLOW UP STUDY   03/03/2017  . IR ANGIOGRAM FOLLOW UP STUDY   03/03/2017  . IR ANGIOGRAM FOLLOW UP STUDY   03/03/2017  . IR ANGIOGRAM FOLLOW UP STUDY   03/03/2017  . IR ANGIOGRAM FOLLOW UP STUDY   05/15/2018  . IR REPLC GASTRO/COLONIC TUBE PERCUT W/FLUORO  04/15/2017  . IR TRANSCATH/EMBOLIZ   03/03/2017  . IR TRANSCATH/EMBOLIZ   05/15/2018  . IR US GUIDE VASC ACCESS RIGHT   05/15/2018  . LAPAROSCOPY        in early 90s  . PEG PLACEMENT N/A 04/01/2017    Procedure: PERCUTANEOUS ENDOSCOPIC GASTROSTOMY (PEG) PLACEMENT;  Surgeon: Georganna Skeans, MD;  Location: [facility];  Service: General;  Laterality: N/A;  . RADIOLOGY WITH ANESTHESIA N/A 03/03/2017     Procedure: RADIOLOGY WITH ANESTHESIA;  Surgeon: Consuella Lose, MD;  Location: [facility];  Service: Radiology;  Laterality: N/A;  . RADIOLOGY WITH ANESTHESIA N/A 05/15/2018    Procedure: Arteriogram, Embolization of recurrent aneurysm;  Surgeon: Consuella Lose, MD;  Location: [facility];  Service: Radiology;  Laterality: N/A;         Family History  Problem Relation Age of Onset  . Diabetes Mother    . Hypertension Mother    . Hyperlipidemia Mother    . Cancer Father          colon cancer  . Diabetes Maternal Grandmother    . Hyperlipidemia Paternal Grandfather      Social History:  reports that she has never smoked. She has never used smokeless tobacco. She reports that she drank alcohol. She reports that she does not use drugs. Allergies: No Known Allergies       Medications Prior to Admission  Medication Sig Dispense Refill  . acetaminophen (TYLENOL) 500 MG tablet Take 1,000 mg by mouth every 6 (six) hours as needed (for pain.).      Marland Kitchen aspirin 325 MG EC tablet Take 325 mg by mouth daily.   2  . clopidogrel (PLAVIX) 75 MG tablet Take 75 mg by mouth daily.   2  . levETIRAcetam (KEPPRA) 1000 MG tablet Take 1 tablet (1,000 mg total) by mouth 2 (two) times daily. 60 tablet 0  . metoprolol tartrate (LOPRESSOR) 50 MG tablet Take 50 mg by mouth 2 (two) times daily.      . Multiple Vitamin (MULTIVITAMIN WITH MINERALS) TABS tablet Take 1 tablet by mouth daily. (Patient taking differently: Take 1 tablet by mouth daily. Women's Ultra GNC Multivitamin) 30 tablet 0      Drug Regimen Review Drug regimen was reviewed and remains appropriate with no significant issues identified   Home: Home Living Family/patient expects to be discharged to:: Private residence Living Arrangements: Spouse/significant other, Children Additional Comments: no family present, Pt unable to answer details   Functional History: Prior Function Comments: no family available to ascertain PLOF and pt unable to  relate   Functional Status:  Mobility: Bed Mobility Overal bed mobility: Needs Assistance Bed Mobility: Supine to Sit, Sit to Supine Supine to sit: Min assist Sit to supine: Min assist General bed mobility comments: increased time to execute commands Transfers Overall transfer level: Needs assistance Transfers: Sit to/from Stand Sit to Stand: Min assist General transfer comment: steady assist Ambulation/Gait Ambulation/Gait assistance: Min assist Gait Distance (Feet): 120 Feet Assistive device: IV Pole, None Gait Pattern/deviations: Step-through pattern General Gait Details: short guarded steps. More unsteady with scanning, turns and backing up, but no overt LOB Gait velocity interpretation: 1.31 - 2.62 ft/sec, indicative of limited community ambulator   ADL: ADL Overall ADL's : Needs assistance/impaired General ADL Comments: Pt is currently total A for all ADL   Cognition: Cognition Overall Cognitive Status: Impaired/Different from baseline Orientation Level: Oriented to person, Oriented to place, Oriented to situation, Disoriented to time Cognition Arousal/Alertness: Awake/alert Behavior During Therapy:  WFL for tasks assessed/performed Overall Cognitive Status: Impaired/Different from baseline Area of Impairment: Problem solving Problem Solving: Slow processing General Comments: mild delay at times   Physical Exam: Blood pressure (!) 141/87, pulse 88, temperature 98.7 F (37.1 C), temperature source Oral, resp. rate 15, height 5\' 4"  (1.626 m), weight 92 kg, SpO2 100 %. Physical Exam  Vitals reviewed. HENT:  Head: Normocephalic.  Eyes: EOM are normal. Right eye exhibits no discharge. Left eye exhibits no discharge.  Neck: Normal range of motion. Neck supple. No thyromegaly present.  Cardiovascular: Normal rate, regular rhythm and normal heart sounds.  Respiratory: Effort normal and breath sounds normal. No respiratory distress.  GI: Soft. Bowel sounds are normal.  She exhibits no distension.  Neurological: She is alert.  Sitting up in chair.  Makes good eye contact with examiner.  Follows simple commands.  Provides her name and age.      Lab Results Last 48 Hours        Results for orders placed or performed during the hospital encounter of 05/15/18 (from the past 48 hour(s))  Glucose, capillary     Status: Abnormal    Collection Time: 05/25/18  8:06 AM  Result Value Ref Range    Glucose-Capillary 103 (H) 70 - 99 mg/dL    Comment 1 Notify RN      Comment 2 Document in Chart    Glucose, capillary     Status: None    Collection Time: 05/25/18 12:40 PM  Result Value Ref Range    Glucose-Capillary 97 70 - 99 mg/dL    Comment 1 Notify RN      Comment 2 Document in Chart    Glucose, capillary     Status: Abnormal    Collection Time: 05/25/18  3:17 PM  Result Value Ref Range    Glucose-Capillary 109 (H) 70 - 99 mg/dL    Comment 1 Notify RN      Comment 2 Document in Chart    Glucose, capillary     Status: None    Collection Time: 05/25/18  7:50 PM  Result Value Ref Range    Glucose-Capillary 85 70 - 99 mg/dL  Glucose, capillary     Status: Abnormal    Collection Time: 05/25/18 11:19 PM  Result Value Ref Range    Glucose-Capillary 140 (H) 70 - 99 mg/dL  Glucose, capillary     Status: None    Collection Time: 05/26/18  3:29 AM  Result Value Ref Range    Glucose-Capillary 85 70 - 99 mg/dL  CBC with Differential/Platelet     Status: Abnormal    Collection Time: 05/26/18  6:13 AM  Result Value Ref Range    WBC 6.8 4.0 - 10.5 K/uL    RBC 4.13 3.87 - 5.11 MIL/uL    Hemoglobin 11.3 (L) 12.0 - 15.0 g/dL    HCT 35.4 (L) 36.0 - 46.0 %    MCV 85.7 78.0 - 100.0 fL    MCH 27.4 26.0 - 34.0 pg    MCHC 31.9 30.0 - 36.0 g/dL    RDW 13.8 11.5 - 15.5 %    Platelets 242 150 - 400 K/uL    Neutrophils Relative % 53 %    Neutro Abs 3.6 1.7 - 7.7 K/uL    Lymphocytes Relative 28 %    Lymphs Abs 1.9 0.7 - 4.0 K/uL    Monocytes Relative 14 %    Monocytes  Absolute 1.0 0.1 - 1.0 K/uL    Eosinophils  Relative 3 %    Eosinophils Absolute 0.2 0.0 - 0.7 K/uL    Basophils Relative 1 %    Basophils Absolute 0.0 0.0 - 0.1 K/uL    Immature Granulocytes 1 %    Abs Immature Granulocytes 0.1 0.0 - 0.1 K/uL      Comment: Performed at [facility] [address] [place], [place] [address]  Phenytoin level, total     Status: Abnormal    Collection Time: 05/26/18  6:13 AM  Result Value Ref Range    Phenytoin Lvl 9.3 (L) 10.0 - 20.0 ug/mL      Comment: Performed at [facility] [address] [place], [place] [address]  Glucose, capillary     Status: None    Collection Time: 05/26/18  7:58 AM  Result Value Ref Range    Glucose-Capillary 89 70 - 99 mg/dL    Comment 1 Notify RN      Comment 2 Document in Chart    Glucose, capillary     Status: None    Collection Time: 05/26/18 12:15 PM  Result Value Ref Range    Glucose-Capillary 84 70 - 99 mg/dL  Glucose, capillary     Status: None    Collection Time: 05/26/18  3:02 PM  Result Value Ref Range    Glucose-Capillary 86 70 - 99 mg/dL    Comment 1 Notify RN      Comment 2 Document in Chart    Glucose, capillary     Status: None    Collection Time: 05/26/18  9:03 PM  Result Value Ref Range    Glucose-Capillary 99 70 - 99 mg/dL  CBC with Differential/Platelet     Status: Abnormal    Collection Time: 05/27/18  4:04 AM  Result Value Ref Range    WBC 6.1 4.0 - 10.5 K/uL    RBC 4.32 3.87 - 5.11 MIL/uL    Hemoglobin 11.9 (L) 12.0 - 15.0 g/dL    HCT 36.7 36.0 - 46.0 %    MCV 85.0 78.0 - 100.0 fL    MCH 27.5 26.0 - 34.0 pg    MCHC 32.4 30.0 - 36.0 g/dL    RDW 13.6 11.5 - 15.5 %    Platelets 275 150 - 400 K/uL    Neutrophils Relative % 47 %    Neutro Abs 2.9 1.7 - 7.7 K/uL    Lymphocytes Relative 34 %    Lymphs Abs 2.1 0.7 - 4.0 K/uL    Monocytes Relative 15 %    Monocytes Absolute 0.9 0.1 - 1.0 K/uL    Eosinophils Relative 2 %    Eosinophils Absolute 0.2 0.0 - 0.7 K/uL    Basophils  Relative 1 %    Basophils Absolute 0.0 0.0 - 0.1 K/uL    Immature Granulocytes 1 %    Abs Immature Granulocytes 0.1 0.0 - 0.1 K/uL      Comment: Performed at [facility], [address], [place], [place] [address]  Phenytoin level, total     Status: Abnormal    Collection Time: 05/27/18  4:04 AM  Result Value Ref Range    Phenytoin Lvl 7.7 (L) 10.0 - 20.0 ug/mL      Comment: Performed at [facility] [address], [place], [place] [address]      Imaging Results (Last 48 hours)  No results found.           Medical Problem List and Plan: 1.  Decreased functional mobility with seizures secondary to residual aneurysm from history of subarachnoid hemorrhage status post embolization 05/15/2018 Initiate PT OT speech in a.m. CIR level 2.  DVT Prophylaxis/Anticoagulation: Subcutaneous heparin. 3. Pain Management: Hydrocodone as needed 4. Mood: Provide emotional support 5. Neuropsych: This patient is capable of making decisions on her own behalf. 6. Skin/Wound Care: Routine skin checks 7. Fluids/Electrolytes/Nutrition: Routine in and outs with follow-up chemistries 8.  Seizure disorder.  Vimpat 200 mg twice daily, Keppra 1500 mg twice daily, Trileptal 750 mg twice daily, monitor for seizure recurrence 9.  GERD.  Protonix       Post Admission Physician Evaluation: Functional deficits secondary  to seizures secondary to residual aneurysm from history of subarachnoid hemorrhage status post embolization 05/15/2018 1. . 2. Patient admitted to receive collaborative, interdisciplinary care between the physiatrist, rehab nursing staff, and therapy team. 3. Patient's level of medical complexity and substantial therapy needs in context of that medical necessity cannot be provided at a lesser intensity of care. 4. Patient has experienced substantial functional loss from his/her baseline. .  Judging by the patient's diagnosis, physical exam, and functional history, the patient has  potential for functional progress which will result in measurable gains while on inpatient rehab.  These gains will be of substantial and practical use upon discharge in facilitating mobility and self-care at the household level. 5. Physiatrist will provide 24 hour management of medical needs as well as oversight of the therapy plan/treatment and provide guidance as appropriate regarding the interaction of the two. 6. 24 hour rehab nursing will assist in the management of  bladder management, bowel management, safety, skin/wound care, disease management, medication administration, pain management and patient education  and help integrate therapy concepts, techniques,education, etc. 7. PT will assess and treat for:pre gait, gait training, endurance , safety, equipment, neuromuscular re education  .  Goals are: supervision. 8. OT will assess and treat for ADLs, Cognitive perceptual skills, Neuromuscular re education, safety, endurance, equipment  .  Goals are: supervision.  9. SLP will assess and treat for memory, attention, concentration, problem-solving, medication management  .  Goals are: supervision. 10. Case Management and Social Worker will assess and treat for psychological issues and discharge planning. 11. Team conference will be held weekly to assess progress toward goals and to determine barriers to discharge. 12.  Patient will receive at least 3 hours of therapy per day at least 5 days per week. 13. ELOS and Prognosis: 5-8 days excellent   "I have personally performed a face to face diagnostic evaluation of this patient.  Additionally, I have reviewed and concur with the physician assistant's documentation above."  Charlett Blake M.D. [facility] FAAPM&R (Sports Med, Neuromuscular Med) Diplomate Am Board of Electrodiagnostic Med   Elizabeth Sauer 05/27/2018

## 2018-05-28 NOTE — Discharge Summary (Signed)
Physician Discharge Summary  Patient ID: Carly [name] MRN: [id number] DOB/AGE: 21-May-1971 47 y.o.  Admit date: 05/15/2018 Discharge date: 05/28/2018  Admission Diagnoses:  Cerebral aneurysm   Discharge Diagnoses:  Same Active Problems:   Cerebral aneurysm, nonruptured   Respiratory failure ([facility])   Status epilepticus ([facility])   Acute encephalopathy   Hypoxemia   History of subarachnoid hemorrhage   Discharged Condition: Stable  Hospital Course:  Carly [name] is a 47 y.o. female who was admitted for the below procedure. Post op complicated by status epilepticus requiring prolonged extubation post operatively. Neurology was consulted for assistance with medication management. She was started on multiple AED. Please review their notes for full details. After seizures controlled, patient made great progress but still have difficulties with gait ambulation. PT/OT rec that she go to CIR. At time of discharge, pain was well controlled, ambulating with Pt/OT, tolerating po, voiding normal. Ready for discharge  Treatments: Surgery - Coil embolization cerebral aneurysm   Discharge Exam: Blood pressure (!) 141/83, pulse 88, temperature 98.3 F (36.8 C), temperature source Oral, resp. rate 17, height 5\' 4"  (1.626 m), weight 89.2 kg, SpO2 99 %. Awake, alert, oriented Speech fluent, appropriate CN grossly intact 5/5 BUE/BLE Wound c/d/i  Disposition: Discharge disposition: [place] [facility] Not Defined       Discharge Instructions    Call MD for:  difficulty breathing, headache or visual disturbances   Complete by:  As directed    Call MD for:  persistant dizziness or light-headedness   Complete by:  As directed    Call MD for:  redness, tenderness, or signs of infection (pain, swelling, redness, odor or green/yellow discharge around incision site)   Complete by:  As directed    Call MD for:  severe uncontrolled pain   Complete by:  As directed    Call MD for:   temperature >100.4   Complete by:  As directed    Diet general   Complete by:  As directed    Driving Restrictions   Complete by:  As directed    Do not drive until given clearance.   Increase activity slowly   Complete by:  As directed    Lifting restrictions   Complete by:  As directed    Do not lift anything >10lbs. Avoid bending and twisting in awkward positions. Avoid bending at the back.   May shower / Bathe   Complete by:  As directed    In 24 hours. Okay to wash wound with warm soapy water. Avoid scrubbing the wound. Pat dry.   Remove dressing in 24 hours   Complete by:  As directed      Allergies as of 05/28/2018   No Known Allergies     Medication List    TAKE these medications   acetaminophen 500 MG tablet Commonly known as:  TYLENOL Take 1,000 mg by mouth every 6 (six) hours as needed (for pain.).   aspirin 325 MG EC tablet Take 325 mg by mouth daily.   clopidogrel 75 MG tablet Commonly known as:  PLAVIX Take 75 mg by mouth daily.   lacosamide 200 MG Tabs tablet Commonly known as:  VIMPAT Take 1 tablet (200 mg total) by mouth 2 (two) times daily.   levETIRAcetam 750 MG tablet Commonly known as:  KEPPRA Take 2 tablets (1,500 mg total) by mouth 2 (two) times daily. What changed:    medication strength  how much to take   metoprolol tartrate 50  MG tablet Commonly known as:  LOPRESSOR Take 50 mg by mouth 2 (two) times daily.   multivitamin with minerals Tabs tablet Take 1 tablet by mouth daily. What changed:  additional instructions   OXcarbazepine 150 MG tablet Commonly known as:  TRILEPTAL Take 4 tablets (600 mg total) by mouth 2 (two) times daily.      Follow-up Information    Consuella Lose, MD. Schedule an appointment as soon as possible for a visit in 3 week(s).   Specialty:  Neurosurgery Contact information: [address] [place] [address] [place] [address] [phone]           Signed: Traci Sermon 05/28/2018, 12:25 PM

## 2018-05-28 NOTE — Progress Notes (Signed)
Received pt. As a new admission.Pt. And her husband were oriented to rehab.Safety plan was explained,fall prevention plan was explained and signed.

## 2018-05-28 NOTE — Progress Notes (Signed)
  NEUROSURGERY PROGRESS NOTE   No issues overnight. No complaints. Ambulating well with rolling walker.  EXAM:  BP (!) 141/83 (BP Location: Left Arm)   Pulse 88   Temp 98.3 F (36.8 C) (Oral)   Resp 17   Ht 5\' 4"  (1.626 m)   Wt 89.2 kg   SpO2 99%   BMI 33.76 kg/m   Awake, alert, oriented  Speech fluent, appropriate  CN grossly intact  5/5 BUE/BLE   IMPRESSION:  47 y.o. female s/p aneurysm embolization, postop status. Doing well  PLAN: - CIR when bed available - Cont AEDs, mgmt/dosing per neurology

## 2018-05-28 NOTE — Progress Notes (Signed)
Physical Medicine and Rehabilitation Consult Reason for Consult: Aphasia and right-sided neglect with hemiparesis Referring Physician: Dr. Kathyrn Sheriff     HPI: Carly [name] is a 47 y.o. 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  She underwent recent follow-up diagnostic cerebral angiogram that showed enlargement of residual aneurysm.  History taken from chart review and patient. Patient lives with spouse and 42 year old daughter in [place].  Independent prior to admission.  One level home with 3 steps to entry.  Presented 05/15/2018 and underwent embolization of residual aneurysm 05/15/2018 per Dr. Kathyrn Sheriff.  Postoperative seizure right side weakness and neglect.  Patient received an additional 500 mg of Keppra as she was already on 1000 mg twice daily.  Initial follow-up cranial CT scan reviewed, unremarkable for acute intracranial process. EEG showed at least 4 left hemispheric subclinical seizures recorded.  MRI of the brain showed small region of cortical T2 flair hyperintensity centered within the left posterior temporal lobe with a few areas of reduced diffusion.  Findings probably represented seizure related activity.  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  Neurology follow-up.  Plavix and aspirin later initiated 05/26/2018 for CVA prophylaxis.  Subcutaneous heparin for DVT prophylaxis has been initiated.  Mechanical soft diet with thin liquids.  Patient currently continues with Vimpat, Keppra as well as Trileptal for seizure disorder.  Therapy evaluations completed with recommendations of physical medicine rehab consult.   Review of Systems  Constitutional: Negative for chills and fever.  HENT: Negative for hearing loss.   Eyes: Negative for  blurred vision and double vision.  Respiratory: Negative for cough and shortness of breath.   Cardiovascular: Negative for chest pain, palpitations and leg swelling.  Gastrointestinal: Positive for nausea and vomiting.       GERD  Genitourinary: Negative for dysuria, flank pain and hematuria.  Musculoskeletal: Positive for myalgias.  Skin: Negative for rash.  Neurological: Positive for seizures and headaches.  Psychiatric/Behavioral: Positive for memory loss.  All other systems reviewed and are negative.   Past Medical History:  Diagnosis Date  . GERD (gastroesophageal reflux disease)    . Hypertension    . Memory difficulties      short term memory  . Migraines    . Seizures ([place])      with hemorrhage  . Subarachnoid hemorrhage ([facility])      d/t burst aneurysm, had seizures         Past Surgical History:  Procedure Laterality Date  . CESAREAN SECTION      . ESOPHAGOGASTRODUODENOSCOPY N/A 04/01/2017    Procedure: ESOPHAGOGASTRODUODENOSCOPY (EGD);  Surgeon: Georganna Skeans, MD;  Location: [facility];  Service: General;  Laterality: N/A;  . IR ANGIO INTRA EXTRACRAN SEL INTERNAL CAROTID UNI R MOD SED   03/03/2017  . IR ANGIO VERTEBRAL SEL SUBCLAVIAN INNOMINATE BILAT MOD SED   02/10/2018  . IR ANGIO VERTEBRAL SEL VERTEBRAL BILAT MOD SED   03/03/2017  . IR ANGIO VERTEBRAL SEL VERTEBRAL BILAT MOD SED   02/10/2018  . IR ANGIOGRAM FOLLOW UP STUDY   03/03/2017  . IR ANGIOGRAM FOLLOW UP STUDY   03/03/2017  . IR ANGIOGRAM FOLLOW UP STUDY   03/03/2017  . IR ANGIOGRAM FOLLOW UP STUDY   03/03/2017  . IR REPLC GASTRO/COLONIC TUBE PERCUT W/FLUORO   04/15/2017  . IR TRANSCATH/EMBOLIZ  03/03/2017  . LAPAROSCOPY        in early 90s  . PEG PLACEMENT N/A 04/01/2017    Procedure: PERCUTANEOUS ENDOSCOPIC GASTROSTOMY (PEG) PLACEMENT;  Surgeon: Georganna Skeans, MD;  Location: [facility];  Service: General;  Laterality: N/A;  . RADIOLOGY WITH ANESTHESIA N/A 03/03/2017    Procedure: RADIOLOGY WITH ANESTHESIA;   Surgeon: Consuella Lose, MD;  Location: [facility];  Service: Radiology;  Laterality: N/A;  . RADIOLOGY WITH ANESTHESIA N/A 05/15/2018    Procedure: Arteriogram, Embolization of recurrent aneurysm;  Surgeon: Consuella Lose, MD;  Location: [facility];  Service: Radiology;  Laterality: N/A;         Family History  Problem Relation Age of Onset  . Diabetes Mother    . Hypertension Mother    . Hyperlipidemia Mother    . Cancer Father          colon cancer  . Diabetes Maternal Grandmother    . Hyperlipidemia Paternal Grandfather      Social History:  reports that she has never smoked. She has never used smokeless tobacco. She reports that she drank alcohol. She reports that she does not use drugs. Allergies: No Known Allergies       Medications Prior to Admission  Medication Sig Dispense Refill  . acetaminophen (TYLENOL) 500 MG tablet Take 1,000 mg by mouth every 6 (six) hours as needed (for pain.).      Marland Kitchen aspirin 325 MG EC tablet Take 325 mg by mouth daily.   2  . clopidogrel (PLAVIX) 75 MG tablet Take 75 mg by mouth daily.   2  . levETIRAcetam (KEPPRA) 1000 MG tablet Take 1 tablet (1,000 mg total) by mouth 2 (two) times daily. 60 tablet 0  . metoprolol tartrate (LOPRESSOR) 50 MG tablet Take 50 mg by mouth 2 (two) times daily.      . Multiple Vitamin (MULTIVITAMIN WITH MINERALS) TABS tablet Take 1 tablet by mouth daily. (Patient taking differently: Take 1 tablet by mouth daily. Women's Ultra GNC Multivitamin) 30 tablet 0      Home: Home Living Family/patient expects to be discharged to:: Private residence Living Arrangements: Spouse/significant other, Children Additional Comments: no family present, Pt unable to answer details  Functional History: Prior Function Comments: no family available to ascertain PLOF and pt unable to relate Functional Status:  Mobility: Bed Mobility Overal bed mobility: Needs Assistance Bed Mobility: Supine to Sit Supine to sit: Min assist Sit to supine:  Total assist, +2 for physical assistance General bed mobility comments: increased time to scoot over to EOB and come up via R elbow Transfers Overall transfer level: Needs assistance Transfers: Sit to/from Stand Sit to Stand: Min assist General transfer comment: cues for hand placement, steady assist Ambulation/Gait Ambulation/Gait assistance: Min assist Gait Distance (Feet): 80 Feet Assistive device: IV Pole Gait Pattern/deviations: Step-through pattern General Gait Details: short guarded steps with L LE showing mild incoordination. Gait velocity interpretation: <1.31 ft/sec, indicative of household ambulator   ADL: ADL Overall ADL's : Needs assistance/impaired General ADL Comments: Pt is currently total A for all ADL   Cognition: Cognition Overall Cognitive Status: Impaired/Different from baseline Orientation Level: Oriented to person, Oriented to place, Oriented to situation, Disoriented to time Cognition Arousal/Alertness: Awake/alert Behavior During Therapy: WFL for tasks assessed/performed Overall Cognitive Status: Impaired/Different from baseline Area of Impairment: Problem solving Problem Solving: Slow processing General Comments: mild delay at times   Blood pressure 129/80, pulse 88, temperature 99 F (37.2 C), temperature source Oral, resp. rate  17, height 5\' 4"  (1.626 m), weight 92 kg, SpO2 97 %. Physical Exam  Vitals reviewed. Constitutional: She appears well-developed and well-nourished.  47 year old right-handed female sitting up in chair  HENT:  Head: Normocephalic and atraumatic.  Eyes: EOM are normal. Right eye exhibits no discharge. Left eye exhibits no discharge.  Neck: Normal range of motion. Neck supple. No thyromegaly present.  Cardiovascular: Normal rate, regular rhythm and normal heart sounds.  Respiratory: Effort normal and breath sounds normal. No respiratory distress.  GI: Soft. Bowel sounds are normal. She exhibits no distension.   Musculoskeletal:  LE edema  Neurological: She is alert.  She answers basic questions in regards to age, date of birth, place as well as follows simple commands Motor: Bilateral upper extremities: 4/5 proximal distal Bilateral lower extremity: Hip flexion 4 -/5, knee extension 4/5, ankle dorsiflexion 4+/5 Sensation intact light touch  Skin: Skin is warm and dry.  Psychiatric: Her speech is normal. She is slowed.      Lab Results Last 24 Hours       Results for orders placed or performed during the hospital encounter of 05/15/18 (from the past 24 hour(s))  Glucose, capillary     Status: None    Collection Time: 05/25/18 12:40 PM  Result Value Ref Range    Glucose-Capillary 97 70 - 99 mg/dL    Comment 1 Notify RN      Comment 2 Document in Chart    Glucose, capillary     Status: Abnormal    Collection Time: 05/25/18  3:17 PM  Result Value Ref Range    Glucose-Capillary 109 (H) 70 - 99 mg/dL    Comment 1 Notify RN      Comment 2 Document in Chart    Glucose, capillary     Status: None    Collection Time: 05/25/18  7:50 PM  Result Value Ref Range    Glucose-Capillary 85 70 - 99 mg/dL  Glucose, capillary     Status: Abnormal    Collection Time: 05/25/18 11:19 PM  Result Value Ref Range    Glucose-Capillary 140 (H) 70 - 99 mg/dL  Glucose, capillary     Status: None    Collection Time: 05/26/18  3:29 AM  Result Value Ref Range    Glucose-Capillary 85 70 - 99 mg/dL  CBC with Differential/Platelet     Status: Abnormal    Collection Time: 05/26/18  6:13 AM  Result Value Ref Range    WBC 6.8 4.0 - 10.5 K/uL    RBC 4.13 3.87 - 5.11 MIL/uL    Hemoglobin 11.3 (L) 12.0 - 15.0 g/dL    HCT 35.4 (L) 36.0 - 46.0 %    MCV 85.7 78.0 - 100.0 fL    MCH 27.4 26.0 - 34.0 pg    MCHC 31.9 30.0 - 36.0 g/dL    RDW 13.8 11.5 - 15.5 %    Platelets 242 150 - 400 K/uL    Neutrophils Relative % 53 %    Neutro Abs 3.6 1.7 - 7.7 K/uL    Lymphocytes Relative 28 %    Lymphs Abs 1.9 0.7 - 4.0 K/uL     Monocytes Relative 14 %    Monocytes Absolute 1.0 0.1 - 1.0 K/uL    Eosinophils Relative 3 %    Eosinophils Absolute 0.2 0.0 - 0.7 K/uL    Basophils Relative 1 %    Basophils Absolute 0.0 0.0 - 0.1 K/uL    Immature Granulocytes 1 %  Abs Immature Granulocytes 0.1 0.0 - 0.1 K/uL  Phenytoin level, total     Status: Abnormal    Collection Time: 05/26/18  6:13 AM  Result Value Ref Range    Phenytoin Lvl 9.3 (L) 10.0 - 20.0 ug/mL  Glucose, capillary     Status: None    Collection Time: 05/26/18  7:58 AM  Result Value Ref Range    Glucose-Capillary 89 70 - 99 mg/dL    Comment 1 Notify RN      Comment 2 Document in Chart         Imaging Results (Last 48 hours)  Dg Swallowing Func-speech Pathology   Result Date: 05/24/2018 Objective Swallowing Evaluation: Type of Study: MBS-Modified Barium Swallow Study  Patient Details Name: Carly [name] MRN: [id number] Date of Birth: April 19, 1971 Today's Date: 05/24/2018 Time: SLP Start Time (ACUTE ONLY): 1030 -SLP Stop Time (ACUTE ONLY): 1055 SLP Time Calculation (min) (ACUTE ONLY): 25 min Past Medical History: Past Medical History: Diagnosis Date . GERD (gastroesophageal reflux disease)  . Hypertension  . Memory difficulties   short term memory . Migraines  . Seizures ([place])   with hemorrhage . Subarachnoid hemorrhage ([facility])   d/t burst aneurysm, had seizures Past Surgical History: Past Surgical History: Procedure Laterality Date . CESAREAN SECTION   . ESOPHAGOGASTRODUODENOSCOPY N/A 04/01/2017  Procedure: ESOPHAGOGASTRODUODENOSCOPY (EGD);  Surgeon: Georganna Skeans, MD;  Location: [facility];  Service: General;  Laterality: N/A; . IR ANGIO INTRA EXTRACRAN SEL INTERNAL CAROTID UNI R MOD SED  03/03/2017 . IR ANGIO VERTEBRAL SEL SUBCLAVIAN INNOMINATE BILAT MOD SED  02/10/2018 . IR ANGIO VERTEBRAL SEL VERTEBRAL BILAT MOD SED  03/03/2017 . IR ANGIO VERTEBRAL SEL VERTEBRAL BILAT MOD SED  02/10/2018 . IR ANGIOGRAM FOLLOW UP STUDY  03/03/2017 . IR ANGIOGRAM FOLLOW UP STUDY   03/03/2017 . IR ANGIOGRAM FOLLOW UP STUDY  03/03/2017 . IR ANGIOGRAM FOLLOW UP STUDY  03/03/2017 . IR REPLC GASTRO/COLONIC TUBE PERCUT W/FLUORO  04/15/2017 . IR TRANSCATH/EMBOLIZ  03/03/2017 . LAPAROSCOPY    in early 90s . PEG PLACEMENT N/A 04/01/2017  Procedure: PERCUTANEOUS ENDOSCOPIC GASTROSTOMY (PEG) PLACEMENT;  Surgeon: Georganna Skeans, MD;  Location: [facility];  Service: General;  Laterality: N/A; . RADIOLOGY WITH ANESTHESIA N/A 03/03/2017  Procedure: RADIOLOGY WITH ANESTHESIA;  Surgeon: Consuella Lose, MD;  Location: [facility];  Service: Radiology;  Laterality: N/A; . RADIOLOGY WITH ANESTHESIA N/A 05/15/2018  Procedure: Arteriogram, Embolization of recurrent aneurysm;  Surgeon: Consuella Lose, MD;  Location: [facility];  Service: Radiology;  Laterality: N/A; HPI: 47 y.o. female admitted due to recurrence of left supraclinoid aneurysm after treatment with coil embolization after rupture June 2018.  Underwent elective Surpass embolization 8/30. Mild aphasia post-op, developed seizures; intubated 9/1-9/6.  Now free of seizures.  Hx of dysphagia/cognitive impairment after admission last year - dysphagia resolved prior to D/C .  Subjective: alert Assessment / Plan / Recommendation CHL IP CLINICAL IMPRESSIONS 05/24/2018 Clinical Impression Patient presents with a mild oropharyngeal dysphagia. Intermittent oral delays noted, likely related to lethargy. Even regular solids however masticated efficiently and cleared fully without cueing. Airway protected across consistencies with only one episode of trace transient penetration of thin liquids, clearing with subsequent swallows. Noted trace pharyngeal coating throughout study and pill remaining in vallecula post swallow initially, both thought to be relatd to the presence of her NG tube. Recommend removal of NG tube prior to beginning pos. Recommend initiation of dysphagia 3 diet (given fluctuating level of alertness), thin liquid with full supervision initially. SLP will f/u  for tolerance and upgrade.  SLP Visit Diagnosis Dysphagia, oropharyngeal phase (R13.12) Attention and concentration deficit following -- Frontal lobe and executive function deficit following -- Impact on safety and function Mild aspiration risk   CHL IP TREATMENT RECOMMENDATION 05/24/2018 Treatment Recommendations Therapy as outlined in treatment plan below   Prognosis 05/24/2018 Prognosis for Safe Diet Advancement Good Barriers to Reach Goals Cognitive deficits Barriers/Prognosis Comment -- CHL IP DIET RECOMMENDATION 05/24/2018 SLP Diet Recommendations Dysphagia 3 (Mech soft) solids;Thin liquid Liquid Administration via Cup;Straw Medication Administration Crushed with puree Compensations Slow rate;Small sips/bites Postural Changes Seated upright at 90 degrees   CHL IP OTHER RECOMMENDATIONS 05/24/2018 Recommended Consults -- Oral Care Recommendations Oral care BID Other Recommendations --   CHL IP FOLLOW UP RECOMMENDATIONS 05/24/2018 Follow up Recommendations None   CHL IP FREQUENCY AND DURATION 05/24/2018 Speech Therapy Frequency (ACUTE ONLY) min 1 x/week Treatment Duration 1 week      CHL IP ORAL PHASE 05/24/2018 Oral Phase WFL Oral - Pudding Teaspoon -- Oral - Pudding Cup -- Oral - Honey Teaspoon -- Oral - Honey Cup -- Oral - Nectar Teaspoon -- Oral - Nectar Cup -- Oral - Nectar Straw -- Oral - Thin Teaspoon -- Oral - Thin Cup -- Oral - Thin Straw -- Oral - Puree -- Oral - Mech Soft -- Oral - Regular -- Oral - Multi-Consistency -- Oral - Pill -- Oral Phase - Comment --  CHL IP PHARYNGEAL PHASE 05/24/2018 Pharyngeal Phase Impaired Pharyngeal- Pudding Teaspoon -- Pharyngeal -- Pharyngeal- Pudding Cup -- Pharyngeal -- Pharyngeal- Honey Teaspoon -- Pharyngeal -- Pharyngeal- Honey Cup -- Pharyngeal -- Pharyngeal- Nectar Teaspoon -- Pharyngeal -- Pharyngeal- Nectar Cup -- Pharyngeal -- Pharyngeal- Nectar Straw -- Pharyngeal -- Pharyngeal- Thin Teaspoon -- Pharyngeal -- Pharyngeal- Thin Cup Penetration/Aspiration during swallow  Pharyngeal Material enters airway, remains ABOVE vocal cords then ejected out Pharyngeal- Thin Straw -- Pharyngeal -- Pharyngeal- Puree -- Pharyngeal -- Pharyngeal- Mechanical Soft -- Pharyngeal -- Pharyngeal- Regular -- Pharyngeal -- Pharyngeal- Multi-consistency -- Pharyngeal -- Pharyngeal- Pill Pharyngeal residue - valleculae Pharyngeal -- Pharyngeal Comment --  CHL IP CERVICAL ESOPHAGEAL PHASE 05/24/2018 Cervical Esophageal Phase (No Data) Pudding Teaspoon -- Pudding Cup -- Honey Teaspoon -- Honey Cup -- Nectar Teaspoon -- Nectar Cup -- Nectar Straw -- Thin Teaspoon -- Thin Cup -- Thin Straw -- Puree -- Mechanical Soft -- Regular -- Multi-consistency -- Pill -- Cervical Esophageal Comment -- Gabriel Rainwater MA, CCC-SLP McCoy Leah Meryl 05/24/2018, 11:09 AM                  Assessment/Plan: Diagnosis: Seizures Labs and images (see above) independently reviewed.  Records reviewed and summated above.   1. Does the need for close, 24 hr/day medical supervision in concert with the patient's rehab needs make it unreasonable for this patient to be served in a less intensive setting? Potentially  2. Co-Morbidities requiring supervision/potential complications: subarachnoid hemorrhage (s/p coil embolization of supraclinoid left internal carotid artery aneurysm), ABLA (transfuse if necessary to ensure appropriate perfusion for increased activity tolerance), hypokalemia (continue to monitor and replete as necessary) 3. Due to safety, disease management and patient education, does the patient require 24 hr/day rehab nursing? Potentially 4. Does the patient require coordinated care of a physician, rehab nurse, PT (1-2 hrs/day, 5 days/week) and OT (1-2 hrs/day, 5 days/week) to address physical and functional deficits in the context of the above medical diagnosis(es)? Potentially Addressing deficits in the following areas: balance, endurance, locomotion, strength, transferring, bathing, dressing, toileting and psychosocial  support 5. Can the patient actively participate in an intensive therapy program of at least 3 hrs of therapy per day at least 5 days per week? Yes 6. The potential for patient to make measurable gains while on inpatient rehab is good 7. Anticipated functional outcomes upon discharge from inpatient rehab are modified independent and supervision  with PT, modified independent and supervision with OT, n/a with SLP. 8. Estimated rehab length of stay to reach the above functional goals is: 5-8 days. 9. Anticipated D/C setting: Home 10. Anticipated post D/C treatments: HH therapy and Home excercise program 11. Overall Rehab/Functional Prognosis: excellent   RECOMMENDATIONS: This patient's condition is appropriate for continued rehabilitative care in the following setting: Patient has made significant functional progress in a short period of time. This is consistent with postictal recovery and anticipate patient will continue to make functional gains. At this time do not believe patient requires CIR, however will continue to follow. Patient has agreed to participate in recommended program. Potentially Note that insurance prior authorization may be required for reimbursement for recommended care.   Comment: Rehab Admissions Coordinator to follow up.     I have personally performed a face to face diagnostic evaluation, including, but not limited to relevant history and physical exam findings, of this patient and developed relevant assessment and plan.  Additionally, I have reviewed and concur with the physician assistant's documentation above.    Delice Lesch, MD, ABPMR Lavon Paganini Angiulli, PA-C 05/26/2018        Revision History                       Routing History

## 2018-05-29 ENCOUNTER — Inpatient Hospital Stay (HOSPITAL_COMMUNITY): Payer: 59 | Admitting: Physical Therapy

## 2018-05-29 ENCOUNTER — Inpatient Hospital Stay (HOSPITAL_COMMUNITY): Payer: 59 | Admitting: Speech Pathology

## 2018-05-29 ENCOUNTER — Other Ambulatory Visit: Payer: Self-pay

## 2018-05-29 ENCOUNTER — Inpatient Hospital Stay (HOSPITAL_COMMUNITY): Payer: 59

## 2018-05-29 DIAGNOSIS — D62 Acute posthemorrhagic anemia: Secondary | ICD-10-CM

## 2018-05-29 DIAGNOSIS — E46 Unspecified protein-calorie malnutrition: Secondary | ICD-10-CM

## 2018-05-29 DIAGNOSIS — R5381 Other malaise: Secondary | ICD-10-CM

## 2018-05-29 DIAGNOSIS — Z6833 Body mass index (BMI) 33.0-33.9, adult: Secondary | ICD-10-CM

## 2018-05-29 DIAGNOSIS — R569 Unspecified convulsions: Secondary | ICD-10-CM

## 2018-05-29 DIAGNOSIS — E8809 Other disorders of plasma-protein metabolism, not elsewhere classified: Secondary | ICD-10-CM

## 2018-05-29 DIAGNOSIS — E6609 Other obesity due to excess calories: Secondary | ICD-10-CM

## 2018-05-29 LAB — CBC WITH DIFFERENTIAL/PLATELET
Abs Immature Granulocytes: 0 10*3/uL (ref 0.0–0.1)
BASOS PCT: 1 %
Basophils Absolute: 0 10*3/uL (ref 0.0–0.1)
EOS ABS: 0.2 10*3/uL (ref 0.0–0.7)
Eosinophils Relative: 3 %
HCT: 33.2 % — ABNORMAL LOW (ref 36.0–46.0)
Hemoglobin: 10.4 g/dL — ABNORMAL LOW (ref 12.0–15.0)
IMMATURE GRANULOCYTES: 1 %
Lymphocytes Relative: 42 %
Lymphs Abs: 2.4 10*3/uL (ref 0.7–4.0)
MCH: 27.2 pg (ref 26.0–34.0)
MCHC: 31.3 g/dL (ref 30.0–36.0)
MCV: 86.9 fL (ref 78.0–100.0)
MONOS PCT: 9 %
Monocytes Absolute: 0.5 10*3/uL (ref 0.1–1.0)
Neutro Abs: 2.5 10*3/uL (ref 1.7–7.7)
Neutrophils Relative %: 44 %
PLATELETS: 301 10*3/uL (ref 150–400)
RBC: 3.82 MIL/uL — AB (ref 3.87–5.11)
RDW: 14.1 % (ref 11.5–15.5)
WBC: 5.7 10*3/uL (ref 4.0–10.5)

## 2018-05-29 LAB — COMPREHENSIVE METABOLIC PANEL
ALT: 26 U/L (ref 0–44)
ANION GAP: 7 (ref 5–15)
AST: 40 U/L (ref 15–41)
Albumin: 2.9 g/dL — ABNORMAL LOW (ref 3.5–5.0)
Alkaline Phosphatase: 108 U/L (ref 38–126)
BUN: 8 mg/dL (ref 6–20)
CHLORIDE: 107 mmol/L (ref 98–111)
CO2: 28 mmol/L (ref 22–32)
Calcium: 8.3 mg/dL — ABNORMAL LOW (ref 8.9–10.3)
Creatinine, Ser: 0.96 mg/dL (ref 0.44–1.00)
GFR calc Af Amer: >60 mL/min (ref >60)
GFR calc non Af Amer: >60 mL/min (ref >60)
Glucose, Bld: 102 mg/dL — ABNORMAL HIGH (ref 70–99)
POTASSIUM: 3.5 mmol/L (ref 3.5–5.1)
Sodium: 142 mmol/L (ref 135–145)
Total Bilirubin: 0.4 mg/dL (ref 0.3–1.2)
Total Protein: 5.9 g/dL — ABNORMAL LOW (ref 6.5–8.1)

## 2018-05-29 LAB — GLUCOSE, CAPILLARY
Glucose-Capillary: 77 mg/dL (ref 70–99)
Glucose-Capillary: 98 mg/dL (ref 70–99)

## 2018-05-29 MED ORDER — PRO-STAT SUGAR FREE PO LIQD
30.0000 mL | Freq: Two times a day (BID) | ORAL | Status: DC
  Administered 2018-05-29 – 2018-06-02 (×9): 30 mL via ORAL
  Filled 2018-05-29 (×8): qty 30

## 2018-05-29 NOTE — Progress Notes (Signed)
[facility] PHYSICAL MEDICINE & REHABILITATION     PROGRESS NOTE  Subjective/Complaints:  Patient seen lying in bed this AM. She states she slept well overnight. She states she is ready to begin therapies.  ROS: denies CP, SOB, nausea, vomiting, diarrhea.  Objective: Vital Signs: Blood pressure 105/61, pulse 72, temperature 98.6 F (37 C), temperature source Oral, resp. rate 18, height 5\' 4"  (1.626 m), weight 89.2 kg, SpO2 97 %. No results found. Recent Labs    05/28/18 1615 05/29/18 0414  WBC 6.0 5.7  HGB 10.9* 10.4*  HCT 34.8* 33.2*  PLT 319 301   Recent Labs    05/28/18 1615 05/29/18 0414  NA  --  142  K  --  3.5  CL  --  107  GLUCOSE  --  102*  BUN  --  8  CREATININE 0.87 0.96  CALCIUM  --  8.3*   CBG (last 3)  Recent Labs    05/27/18 2228 05/28/18 0411 05/28/18 1204  GLUCAP 128* 97 114*    Wt Readings from Last 3 Encounters:  05/28/18 89.2 kg  05/28/18 89.2 kg  04/20/18 113.1 kg    Physical Exam:  BP 105/61 (BP Location: Left Arm)   Pulse 72   Temp 98.6 F (37 C) (Oral)   Resp 18   Ht 5\' 4"  (1.626 m)   Wt 89.2 kg   SpO2 97%   BMI 33.75 kg/m  Gen.: NAD. Obese. HENT: Normocephalic. Atraumatic. Eyes:EOMare normal. Novdischarge.  Cardiovascular:Normal rate,regular rhythmand no JVD. Respiratory:Effort normaland breath sounds normal.  NT:IRWER sounds are normal. She exhibitsno distension.  Musculoskeletal: lower extremity edema Neurological: She isalert. Makes good eye contact with examiner.  Follows simple commands.  Motor: Grossly 4/5 throughout Skin: Warm and dry. Intact. Psych: Normal mood and affect.  Assessment/Plan: 1. Functional deficits secondary to seizures which require 3+ hours per day of interdisciplinary therapy in a comprehensive inpatient rehab setting. Physiatrist is providing close team supervision and 24 hour management of active medical problems listed below. Physiatrist and rehab team continue to assess  barriers to discharge/monitor patient progress toward functional and medical goals.  Function:  Bathing Bathing position      Bathing parts      Bathing assist        Upper Body Dressing/Undressing Upper body dressing                    Upper body assist        Lower Body Dressing/Undressing Lower body dressing                                  Lower body assist        Toileting Toileting          Toileting assist     Transfers Chair/bed transfer             Locomotion Ambulation           Wheelchair          Cognition Comprehension    Expression    Social Interaction    Problem Solving    Memory      Medical Problem List and Plan: 1.Decreased functional mobility with seizuressecondary to residual aneurysm from history of subarachnoid hemorrhage status post embolization 05/15/2018  Begin CIR  Notes reviewed - seizures due to residual aneurysm, CT head reviewed-unremarkable for acute intracranial process, labs reviewed 2. DVT  Prophylaxis/Anticoagulation: Subcutaneous heparin. 3. Pain Management:Hydrocodone as needed 4. Mood:Provide emotional support 5. Neuropsych: This patientiscapable of making decisions on herown behalf. 6. Skin/Wound Care:Routine skin checks 7. Fluids/Electrolytes/Nutrition:Routine in and outs   BMP within acceptable range on 9/13 8.Seizure disorder. Vimpat 200 mg twice daily, Keppra 1500 mg twice daily, Trileptal 750mg  twice daily, monitor for seizure recurrence 9.GERD. Protonix 10. Hypoalbuminemia  Supplement initiated on 9/13 11. Acute blood loss anemia  Hemoglobin 10.4 on 9/13  Continue to monitor 12. Obesity  Encourage weight loss  LOS (Days) 1 A FACE TO FACE EVALUATION WAS PERFORMED  Carly [name] Lorie Phenix 05/29/2018 8:45 AM

## 2018-05-29 NOTE — Evaluation (Signed)
Speech Language Pathology Assessment and Plan  Patient Details  Name: ROSILAND SEN MRN: [id number] Date of Birth: 04-01-1971   Today's Date: 05/29/2018 SLP Individual Time: 1305-1400 SLP Individual Time Calculation (min): 55 min   Problem List:  Patient Active Problem List   Diagnosis Date Noted  . Seizures ([place])   . Debility   . Hypoalbuminemia due to protein-calorie malnutrition ([place])   . History of subarachnoid hemorrhage   . Acute encephalopathy   . Hypoxemia   . Respiratory failure ([place])   . Status epilepticus ([place])   . 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 Palliative care encounter   . AKI (acute kidney injury) ([place])   . Adjustment disorder with mixed anxiety and depressed mood   . Dislodged gastrostomy tube ([place])   . Sepsis ([place])   . Hypokalemia   . Elevated temperature   . SIRS (systemic inflammatory response syndrome) (HCC)   . Decreased oral intake   . Cognitive deficit, post-stroke   . Prediabetes   . Labile blood pressure   . Transaminitis   . 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 Hydrocephalus   . Level of consciousness decreased   . SAH (subarachnoid hemorrhage) ([place])   . Tachycardia   . PEG (percutaneous endoscopic gastrostomy) status ([place])   . Acute blood loss anemia   . Tachypnea   . Ventilator dependent ([place])   . Palliative care by specialist   . Electrolyte imbalance 03/19/2017  . History of ETT   . Encounter for intubation   . Subarachnoid hemorrhage ([place])   . Acute respiratory failure ([place])   . Ruptured cerebral aneurysm ([place]) 03/03/2017  . Routine general medical examination at a health care facility 08/23/2013  . Essential hypertension, benign 08/23/2013  . Impaired fasting glucose 08/23/2013  . Plantar fasciitis, bilateral 08/23/2013  . Unspecified vitamin D deficiency 08/23/2013  . Obesity, unspecified 08/23/2013  . Unspecified essential hypertension 12/31/2012  . GERD (gastroesophageal reflux disease) 12/31/2012  .  COMMON MIGRAINE 06/16/2008  . CHEST PAIN, INTERMITTENT 06/16/2008   Past Medical History:  Past Medical History:  Diagnosis Date  . GERD (gastroesophageal reflux disease)   . Hypertension   . Memory difficulties    short term memory  . Migraines   . Seizures ([place])    with hemorrhage  . Subarachnoid hemorrhage (HCC)    d/t burst aneurysm, had seizures   Past Surgical History:  Past Surgical History:  Procedure Laterality Date  . CESAREAN SECTION    . ESOPHAGOGASTRODUODENOSCOPY N/A 04/01/2017   Procedure: ESOPHAGOGASTRODUODENOSCOPY (EGD);  Surgeon: Georganna Skeans, MD;  Location: [facility];  Service: General;  Laterality: N/A;  . IR ANGIO INTRA EXTRACRAN SEL INTERNAL CAROTID UNI L MOD SED  05/15/2018  . IR ANGIO INTRA EXTRACRAN SEL INTERNAL CAROTID UNI R MOD SED  03/03/2017  . IR ANGIO VERTEBRAL SEL SUBCLAVIAN INNOMINATE BILAT MOD SED  02/10/2018  . IR ANGIO VERTEBRAL SEL VERTEBRAL BILAT MOD SED  03/03/2017  . IR ANGIO VERTEBRAL SEL VERTEBRAL BILAT MOD SED  02/10/2018  . IR ANGIOGRAM FOLLOW UP STUDY  03/03/2017  . IR ANGIOGRAM FOLLOW UP STUDY  03/03/2017  . IR ANGIOGRAM FOLLOW UP STUDY  03/03/2017  . IR ANGIOGRAM FOLLOW UP STUDY  03/03/2017  . IR ANGIOGRAM FOLLOW UP STUDY  05/15/2018  . IR REPLC GASTRO/COLONIC TUBE PERCUT W/FLUORO  04/15/2017  . IR TRANSCATH/EMBOLIZ  03/03/2017  . IR TRANSCATH/EMBOLIZ  05/15/2018  . IR US GUIDE VASC ACCESS RIGHT  05/15/2018  . LAPAROSCOPY  in early 90s  . PEG PLACEMENT N/A 04/01/2017   Procedure: PERCUTANEOUS ENDOSCOPIC GASTROSTOMY (PEG) PLACEMENT;  Surgeon: Georganna Skeans, MD;  Location: [facility];  Service: General;  Laterality: N/A;  . RADIOLOGY WITH ANESTHESIA N/A 03/03/2017   Procedure: RADIOLOGY WITH ANESTHESIA;  Surgeon: Consuella Lose, MD;  Location: [facility];  Service: Radiology;  Laterality: N/A;  . RADIOLOGY WITH ANESTHESIA N/A 05/15/2018   Procedure: Arteriogram, Embolization of recurrent aneurysm;  Surgeon: Consuella Lose, MD;   Location: [facility];  Service: Radiology;  Laterality: N/A;    Assessment / Plan / Recommendation Clinical Impression   Eunice Winecoff is a 47 year old right-handed female with history of subarachnoid hemorrhage underwent coil embolization of supraclinoid left internal carotid artery aneurysm June 2018 and received inpatient rehab services 7/25/2018until 04/23/2017. Per chart review patient lives with spouse and 36 year old daughter in [place]. Independent prior to admission. One level home with 3 steps to entry. Patient recently underwent follow-up diagnostic cerebral angiogram that showed enlargement of residual aneurysm. She was admitted 05/15/2018 and underwent embolization of residual aneurysm 05/15/2018 per Dr. Kathyrn Sheriff. Postoperative seizure right side weakness and neglect. Received additional 500 mg of Keppra as she was already on at thousand milligrams twice daily. Initial fall cranial CT scan reviewed, unremarkable for acute intracranial process. EEG showed at least 4 left hemispheric subclinical seizures recorded. MRI of the brain showed small region of cortical T2 flair hyperintensity centered within the left posterior temporal lobe with a few areas of reduced diffusion. Findings probably represented seizure-like activity. 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 Presently on Plavix and aspirin for CVA prophylaxisas prior to admission. Subcutaneous heparin for DVT prophylaxisinitiated 05/20/2018. She continues on Vimpat, Keppra as well as Trileptal for seizure disorder. Therapy evaluations completed with recommendations of physical medicine rehab consult. Patient was admitted for a comprehensive rehab program.  SLP evaluation was completed on 05/29/2018 with the following results:  Pt presents with grossly intact cognitive-linguistic function.  Pt was able to  recall 3 out of 5 words on delayed recall task, pt initially made simple organizational errors on clock drawing task but was able to self correct, and she was able to complete basic, money management, mental math calculations.  Pt needed min cues to correct errors while organizing her pills into a twice daily pill box after losing her place whien reading from her list of medications; however, she was able to recall function and frequency of her previously scheduled medications for 100% accuracy.  SLP provided pt with a list of current medications to facilitate carryover of new medications.  Pt was agreeable to having family check behind her for medication management once discharged home.  Pt and pt's brother who was present during today's therapy session both agree that pt is near her baseline for cognition.   SLP also administered a bedside swallow evaluation.  Oropharyngeal swallowing function appears grossly WFL at bedside with no overt s/s of aspiration with solids or liquids as well as timely and efficient oral phase.  Recommend advancing pt to regular textures and thin liquids with no further ST needs indicated for dysphagia.   Given that pt is at baseline for cognition and dysphagia, no further ST needs are indicated at this time.  Pt education is complete, recommending that pt have at least initial assistance for medication management once discharged home.    Skilled Therapeutic Interventions  Cognitive-linguistic evaluation completed with results and recommendations reviewed with patient and family.     SLP Assessment  Patient does not need any further Speech Lanaguage Pathology Services    Recommendations  SLP Diet Recommendations: Age appropriate regular solids;Thin Medication Administration: Whole meds with liquid Supervision: Patient able to self feed Compensations: Slow rate;Small sips/bites Oral Care Recommendations: Oral care BID Patient destination: Home Follow up  Recommendations: None Equipment Recommended: None recommended by SLP           Pain Pain Assessment Pain Scale: 0-10 Pain Score: 0-No pain  Prior Functioning Cognitive/Linguistic Baseline: Within functional limits Type of Home: House  Lives With: Spouse;Family Available Help at Discharge: Family;Available 24 hours/day Vocation: On disability  Function:  Eating Eating                 Cognition Comprehension Comprehension assist level: Follows complex conversation/direction with extra time/assistive device  Expression   Expression assist level: Expresses complex ideas: With extra time/assistive device  Social Interaction Social Interaction assist level: Interacts appropriately with others with medication or extra time (anti-anxiety, antidepressant).  Problem Solving Problem solving assist level: Solves basic problems with no assist  Memory Memory assist level: More than reasonable amount of time     Refer to Care Plan for Long Term Goals  Recommendations for other services: None   Discharge Criteria: Patient will be discharged from SLP if patient refuses treatment 3 consecutive times without medical reason, if treatment goals not met, if there is a change in medical status, if patient makes no progress towards goals or if patient is discharged from hospital.  The above assessment, treatment plan, treatment alternatives and goals were discussed and mutually agreed upon: by patient  Emilio Math 05/29/2018, 3:54 PM

## 2018-05-29 NOTE — Evaluation (Signed)
Physical Therapy Assessment and Plan  Patient Details  Name: Carly [name] MRN: [id number] Date of Birth: May 12, 1971  PT Diagnosis: Abnormality of gait, Difficulty walking and Muscle weakness Rehab Potential: Excellent ELOS: 3-5 days    Today's Date: 05/29/2018 PT Individual Time: 0804-0901 PT Individual Time Calculation (min): 57 min    Problem List:  Patient Active Problem List   Diagnosis Date Noted  . Seizures ([place])   . Debility   . Hypoalbuminemia due to protein-calorie malnutrition ([place])   . History of subarachnoid hemorrhage   . Acute encephalopathy   . Hypoxemia   . Respiratory failure ([place])   . Status epilepticus ([place])   . 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 Palliative care encounter   . AKI (acute kidney injury) ([place])   . Adjustment disorder with mixed anxiety and depressed mood   . Dislodged gastrostomy tube ([place])   . Sepsis ([place])   . Hypokalemia   . Elevated temperature   . SIRS (systemic inflammatory response syndrome) (HCC)   . Decreased oral intake   . Cognitive deficit, post-stroke   . Prediabetes   . Labile blood pressure   . Transaminitis   . 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 Hydrocephalus   . Level of consciousness decreased   . SAH (subarachnoid hemorrhage) ([place])   . Tachycardia   . PEG (percutaneous endoscopic gastrostomy) status ([place])   . Acute blood loss anemia   . Tachypnea   . Ventilator dependent ([place])   . Palliative care by specialist   . Electrolyte imbalance 03/19/2017  . History of ETT   . Encounter for intubation   . Subarachnoid hemorrhage ([place])   . Acute respiratory failure ([place])   . Ruptured cerebral aneurysm ([place]) 03/03/2017  . Routine general medical examination at a health care facility 08/23/2013  . Essential hypertension, benign 08/23/2013  . Impaired fasting glucose 08/23/2013  . Plantar fasciitis, bilateral 08/23/2013  . Unspecified vitamin D deficiency 08/23/2013  . Obesity, unspecified  08/23/2013  . Unspecified essential hypertension 12/31/2012  . GERD (gastroesophageal reflux disease) 12/31/2012  . COMMON MIGRAINE 06/16/2008  . CHEST PAIN, INTERMITTENT 06/16/2008    Past Medical History:  Past Medical History:  Diagnosis Date  . GERD (gastroesophageal reflux disease)   . Hypertension   . Memory difficulties    short term memory  . Migraines   . Seizures ([place])    with hemorrhage  . Subarachnoid hemorrhage (HCC)    d/t burst aneurysm, had seizures   Past Surgical History:  Past Surgical History:  Procedure Laterality Date  . CESAREAN SECTION    . ESOPHAGOGASTRODUODENOSCOPY N/A 04/01/2017   Procedure: ESOPHAGOGASTRODUODENOSCOPY (EGD);  Surgeon: Georganna Skeans, MD;  Location: [facility];  Service: General;  Laterality: N/A;  . IR ANGIO INTRA EXTRACRAN SEL INTERNAL CAROTID UNI L MOD SED  05/15/2018  . IR ANGIO INTRA EXTRACRAN SEL INTERNAL CAROTID UNI R MOD SED  03/03/2017  . IR ANGIO VERTEBRAL SEL SUBCLAVIAN INNOMINATE BILAT MOD SED  02/10/2018  . IR ANGIO VERTEBRAL SEL VERTEBRAL BILAT MOD SED  03/03/2017  . IR ANGIO VERTEBRAL SEL VERTEBRAL BILAT MOD SED  02/10/2018  . IR ANGIOGRAM FOLLOW UP STUDY  03/03/2017  . IR ANGIOGRAM FOLLOW UP STUDY  03/03/2017  . IR ANGIOGRAM FOLLOW UP STUDY  03/03/2017  . IR ANGIOGRAM FOLLOW UP STUDY  03/03/2017  . IR ANGIOGRAM FOLLOW UP STUDY  05/15/2018  . IR REPLC GASTRO/COLONIC TUBE PERCUT W/FLUORO  04/15/2017  . IR TRANSCATH/EMBOLIZ  03/03/2017  .  IR TRANSCATH/EMBOLIZ  05/15/2018  . IR US GUIDE VASC ACCESS RIGHT  05/15/2018  . LAPAROSCOPY     in early 90s  . PEG PLACEMENT N/A 04/01/2017   Procedure: PERCUTANEOUS ENDOSCOPIC GASTROSTOMY (PEG) PLACEMENT;  Surgeon: Georganna Skeans, MD;  Location: [facility];  Service: General;  Laterality: N/A;  . RADIOLOGY WITH ANESTHESIA N/A 03/03/2017   Procedure: RADIOLOGY WITH ANESTHESIA;  Surgeon: Consuella Lose, MD;  Location: [facility];  Service: Radiology;  Laterality: N/A;  . RADIOLOGY WITH  ANESTHESIA N/A 05/15/2018   Procedure: Arteriogram, Embolization of recurrent aneurysm;  Surgeon: Consuella Lose, MD;  Location: [facility];  Service: Radiology;  Laterality: N/A;    Assessment & Plan Clinical Impression: Patient is a 47 year old right-handed female with history of subarachnoid hemorrhage underwent coil embolization of supraclinoid left internal carotid artery aneurysm June 2018 and received inpatient rehab services 7/25/2018until 04/23/2017. Per chart review patient lives with spouse and 74 year old daughter in [place]. Independent prior to admission. One level home with 3 steps to entry. Patient recently underwent follow-up diagnostic cerebral angiogram that showed enlargement of residual aneurysm. She was admitted 05/15/2018 and underwent embolization of residual aneurysm 05/15/2018 per Dr. Kathyrn Sheriff. Postoperative seizure right side weakness and neglect. Received additional 500 mg of Keppra as she was already on at thousand milligrams twice daily. Initial fall cranial CT scan reviewed, unremarkable for acute intracranial process. EEG showed at least 4 left hemispheric subclinical seizures recorded. MRI of the brain showed small region of cortical T2 flair hyperintensity centered within the left posterior temporal lobe with a few areas of reduced diffusion. Findings probably represented seizure-like activity. 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  Patient transferred to CIR on 05/28/2018 .   Patient currently requires supervision with mobility secondary to muscle weakness, decreased cardiorespiratoy endurance and decreased standing balance and decreased balance strategies.  Prior to hospitalization, patient was independent  with mobility and lived with Spouse, Family in a House home.  Home access is 3Stairs to enter.  Patient will benefit from skilled  PT intervention to maximize safe functional mobility, minimize fall risk and decrease caregiver burden for planned discharge home alone.  Anticipate patient will benefit from follow up [place] at discharge.  PT - End of Session Activity Tolerance: Tolerates 10 - 20 min activity with multiple rests Endurance Deficit: Yes PT Assessment Rehab Potential (ACUTE/IP ONLY): Excellent PT Barriers to Discharge: Decreased caregiver support;Home environment access/layout PT Patient demonstrates impairments in the following area(s): Balance;Endurance;Motor;Safety PT Transfers Functional Problem(s): Bed Mobility;Bed to Chair;Car;Furniture;Floor PT Locomotion Functional Problem(s): Ambulation;Wheelchair Mobility;Stairs PT Plan PT Intensity: Minimum of 1-2 x/day ,45 to 90 minutes PT Frequency: 5 out of 7 days PT Duration Estimated Length of Stay: 3-5 days  PT Treatment/Interventions: Ambulation/gait training;Balance/vestibular training;Community reintegration;Disease management/prevention;Discharge planning;DME/adaptive equipment instruction;Functional mobility training;Psychosocial support;Stair training;Splinting/orthotics;Therapeutic Activities;Therapeutic Exercise;Visual/perceptual remediation/compensation;Patient/family education;UE/LE Coordination activities;UE/LE Strength taining/ROM;Wheelchair propulsion/positioning PT Transfers Anticipated Outcome(s): Mod I with LRAD  PT Locomotion Anticipated Outcome(s): Mod I ambulatory with LRAD  PT Recommendation Follow Up Recommendations: Home health PT Patient destination: Home Equipment Recommended: To be determined Equipment Details: has RW and WC  Skilled Therapeutic Intervention Pt received sitting EOB and agreeable to PT. PT instructed patient in PT Evaluation and initiated treatment intervention; see below for results. PT educated patient in [place], rehab potential, rehab goals, and discharge recommendations. .Patient demonstrates increased fall risk as noted  by score of   52/56 on Berg  Balance Scale.  (<36= high risk for falls, close to 100%; 37-45 significant >80%; 46-51 moderate >50%; 52-55 lower >25%) Pt returned to room and performed stand pivot transfer transfer to bed with supervision assist. Sit>supine completed without assist, and left supine in bed with call bell in reach and all needs met.       PT Evaluation Precautions/Restrictions   General   Vital Signs  Pain   Home Living/Prior Functioning Home Living Available Help at Discharge: Family;Available 24 hours/day Type of Home: House Home Access: Stairs to enter [employer] of Steps: 3 Entrance Stairs-Rails: Left Home Layout: One level Bathroom Shower/Tub: Chiropodist: Standard Bathroom Accessibility: Yes  Lives With: Spouse;Family Prior Function Level of Independence: Independent with basic ADLs;Independent with homemaking with ambulation;Independent with gait  Able to Take Stairs?: Yes Driving: Yes Vocation: On disability Comments: visits family Vision/Perception  Perception Perception: Within Functional Limits Praxis Praxis: Intact  Cognition Orientation Level: Oriented X4 Memory: Appears intact Awareness: Appears intact Safety/Judgment: Appears intact Sensation Sensation Light Touch: Appears Intact Proprioception: Appears Intact Coordination Gross Motor Movements are Fluid and Coordinated: Yes Fine Motor Movements are Fluid and Coordinated: Yes Finger Nose Finger Test: [facility] Motor  Motor Motor: Within Functional Limits  Mobility Bed Mobility Bed Mobility: Rolling Right;Rolling Left;Supine to Sit;Sit to Supine Rolling Right: Independent with assistive device Rolling Left: Independent with assistive device Supine to Sit: Independent with assistive device Sit to Supine: Independent with assistive device Transfers Transfers: Sit to Stand;Stand Pivot Transfers Sit to Stand: Supervision/Verbal cueing Stand Pivot Transfers:  Supervision/Verbal cueing Transfer (Assistive device): None Locomotion  Gait Ambulation: Yes Gait Assistance: Supervision/Verbal cueing Gait Distance (Feet): 150 Feet Assistive device: None Stairs / Additional Locomotion Stairs: Yes Stairs Assistance: Supervision/Verbal cueing Stair Management Technique: Two rails Number of Stairs: 12 Height of Stairs: 6 Wheelchair Mobility Wheelchair Mobility: Yes Wheelchair Assistance: Chartered loss adjuster: Both upper extremities Distance: 174f  Trunk/Postural Assessment  Cervical Assessment Cervical Assessment: Within Functional Limits Thoracic Assessment Thoracic Assessment: Within Functional Limits Lumbar Assessment Lumbar Assessment: Within Functional Limits Postural Control Postural Control: Within Functional Limits  Balance Standardized Balance Assessment Standardized Balance Assessment: Berg Balance Test Berg Balance Test Sit to Stand: Able to stand without using hands and stabilize independently Standing Unsupported: Able to stand safely 2 minutes Sitting with Back Unsupported but Feet Supported on Floor or Stool: Able to sit safely and securely 2 minutes Stand to Sit: Sits safely with minimal use of hands Transfers: Able to transfer safely, minor use of hands Standing Unsupported with Eyes Closed: Able to stand 10 seconds safely Standing Ubsupported with Feet Together: Able to place feet together independently and stand 1 minute safely From Standing, Reach Forward with Outstretched Arm: Can reach confidently >25 cm (10") From Standing Position, Pick up Object from Floor: Able to pick up shoe safely and easily From Standing Position, Turn to Look Behind Over each Shoulder: Looks behind one side only/other side shows less weight shift Turn 360 Degrees: Able to turn 360 degrees safely but slowly Standing Unsupported, Alternately Place Feet on Step/Stool: Able to stand independently and safely and complete  8 steps in 20 seconds Standing Unsupported, One Foot in Front: Able to place foot tandem independently and hold 30 seconds Standing on One Leg: Able to lift leg independently and hold 5-10 seconds Total Score: 52 Extremity Assessment      RLE Assessment RLE Assessment: Within Functional Limits General Strength Comments: grossly 5/5 proximal to distal LLE Assessment LLE Assessment: Within  Functional Limits General Strength Comments: grossly 5/5 proximal to distal   See Function Navigator for Current Functional Status.   Refer to Care Plan for Long Term Goals  Recommendations for other services: Therapeutic Recreation  Stress management and Outing/community reintegration  Discharge Criteria: Patient will be discharged from PT if patient refuses treatment 3 consecutive times without medical reason, if treatment goals not met, if there is a change in medical status, if patient makes no progress towards goals or if patient is discharged from hospital.  The above assessment, treatment plan, treatment alternatives and goals were discussed and mutually agreed upon: by patient  Lorie Phenix 05/29/2018, 9:54 AM

## 2018-05-29 NOTE — Progress Notes (Signed)
Social Work  Social Work Assessment and Plan  Patient Details  Name: Carly [name] MRN: [id number] Date of Birth: [birth year]  Today's Date: 05/29/2018  Problem List:  Patient Active Problem List   Diagnosis Date Noted  . Seizures ([place])   . Debility   . Hypoalbuminemia due to protein-calorie malnutrition ([place])   . History of subarachnoid hemorrhage   . Acute encephalopathy   . Hypoxemia   . Respiratory failure ([place])   . Status epilepticus ([place])   . 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 Palliative care encounter   . AKI (acute kidney injury) ([place])   . Adjustment disorder with mixed anxiety and depressed mood   . Dislodged gastrostomy tube ([place])   . Sepsis ([place])   . Hypokalemia   . Elevated temperature   . SIRS (systemic inflammatory response syndrome) (HCC)   . Decreased oral intake   . Cognitive deficit, post-stroke   . Prediabetes   . Labile blood pressure   . Transaminitis   . 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 Hydrocephalus   . Level of consciousness decreased   . SAH (subarachnoid hemorrhage) ([place])   . Tachycardia   . PEG (percutaneous endoscopic gastrostomy) status ([place])   . Acute blood loss anemia   . Tachypnea   . Ventilator dependent ([place])   . Palliative care by specialist   . Electrolyte imbalance 03/19/2017  . History of ETT   . Encounter for intubation   . Subarachnoid hemorrhage ([place])   . Acute respiratory failure ([place])   . Ruptured cerebral aneurysm ([place]) 03/03/2017  . Routine general medical examination at a health care facility 08/23/2013  . Essential hypertension, benign 08/23/2013  . Impaired fasting glucose 08/23/2013  . Plantar fasciitis, bilateral 08/23/2013  . Unspecified vitamin D deficiency 08/23/2013  . Obesity, unspecified 08/23/2013  . Unspecified essential hypertension 12/31/2012  . GERD (gastroesophageal reflux disease) 12/31/2012  . COMMON MIGRAINE 06/16/2008  . CHEST PAIN, INTERMITTENT 06/16/2008   Past  Medical History:  Past Medical History:  Diagnosis Date  . GERD (gastroesophageal reflux disease)   . Hypertension   . Memory difficulties    short term memory  . Migraines   . Seizures ([place])    with hemorrhage  . Subarachnoid hemorrhage (HCC)    d/t burst aneurysm, had seizures   Past Surgical History:  Past Surgical History:  Procedure Laterality Date  . CESAREAN SECTION    . ESOPHAGOGASTRODUODENOSCOPY N/A 04/01/2017   Procedure: ESOPHAGOGASTRODUODENOSCOPY (EGD);  Surgeon: Georganna Skeans, MD;  Location: [facility];  Service: General;  Laterality: N/A;  . IR ANGIO INTRA EXTRACRAN SEL INTERNAL CAROTID UNI L MOD SED  05/15/2018  . IR ANGIO INTRA EXTRACRAN SEL INTERNAL CAROTID UNI R MOD SED  03/03/2017  . IR ANGIO VERTEBRAL SEL SUBCLAVIAN INNOMINATE BILAT MOD SED  02/10/2018  . IR ANGIO VERTEBRAL SEL VERTEBRAL BILAT MOD SED  03/03/2017  . IR ANGIO VERTEBRAL SEL VERTEBRAL BILAT MOD SED  02/10/2018  . IR ANGIOGRAM FOLLOW UP STUDY  03/03/2017  . IR ANGIOGRAM FOLLOW UP STUDY  03/03/2017  . IR ANGIOGRAM FOLLOW UP STUDY  03/03/2017  . IR ANGIOGRAM FOLLOW UP STUDY  03/03/2017  . IR ANGIOGRAM FOLLOW UP STUDY  05/15/2018  . IR REPLC GASTRO/COLONIC TUBE PERCUT W/FLUORO  04/15/2017  . IR TRANSCATH/EMBOLIZ  03/03/2017  . IR TRANSCATH/EMBOLIZ  05/15/2018  . IR US GUIDE VASC ACCESS RIGHT  05/15/2018  . LAPAROSCOPY     in early 90s  . PEG PLACEMENT  N/A 04/01/2017   Procedure: PERCUTANEOUS ENDOSCOPIC GASTROSTOMY (PEG) PLACEMENT;  Surgeon: Georganna Skeans, MD;  Location: [facility];  Service: General;  Laterality: N/A;  . RADIOLOGY WITH ANESTHESIA N/A 03/03/2017   Procedure: RADIOLOGY WITH ANESTHESIA;  Surgeon: Consuella Lose, MD;  Location: [facility];  Service: Radiology;  Laterality: N/A;  . RADIOLOGY WITH ANESTHESIA N/A 05/15/2018   Procedure: Arteriogram, Embolization of recurrent aneurysm;  Surgeon: Consuella Lose, MD;  Location: [facility];  Service: Radiology;  Laterality: N/A;   Social History:   reports that she has never smoked. She has never used smokeless tobacco. She reports that she drank alcohol. She reports that she does not use drugs.  Family / Support Systems Marital Status: Married Patient Roles: Spouse, Parent Spouse/Significant Other: [name] [phone]  [phone] Children:  31yo daughter in the home and 28 yo son on his own Other Supports: G Tyrone-brother [phone]-cell Anticipated Caregiver: Husband and daughter Ability/Limitations of Caregiver: husband works but is getting paperwork completed for [employer] and daughter is a Equities trader in [place] Availability: 24/7 Family Dynamics: Close knit family who all pull together in time of need. Pt did very well when here last year 2018. She has extended family and church members who are supportive and visit.  Social History Preferred language: English Religion: Holiness Cultural Background: No issues Education: High School Read: Yes Write: Yes Employment Status: Disabled Date Retired/Disabled/Unemployed: 2018 Freight forwarder Issues: No issues Guardian/Conservator: None-according to MD pt is capable of making her own decisions while here. Will also include her husband if any decision needs to be made while here.   Abuse/Neglect Abuse/Neglect Assessment Can Be Completed: Yes Physical Abuse: Denies Verbal Abuse: Denies Sexual Abuse: Denies Exploitation of patient/patient's resources: Denies Self-Neglect: Denies  Emotional Status Pt's affect, behavior adn adjustment status: Pt is motivated and feels she is doing well with this surgery. She had recovered from last surgery last year and is hopeful she will again. She is moving well may only need supervision level from here. Pt aware of the rehab process was just here last year. Recent Psychosocial Issues: other health issues was doing well until this-was managing at home alone while husband worked Pyschiatric History: No history was seen last admit by  neuro-psych will try to have see again but short LOS due to high level. Will have seen early next week Substance Abuse History: No issues  Patient / Family Perceptions, Expectations & Goals Pt/Family understanding of illness & functional limitations: Pt and husband can explain her anneurysm coiling, had last year. Aware had a seizure this time. Both talk with the MD and feel they have a good understanding of her treatment plan going forward. Has been through this before and glad to be back on CIR> Premorbid pt/family roles/activities: Wife, Mom,retiree, friend, church member, etc Anticipated changes in roles/activities/participation: resume Pt/family expectations/goals: Pt states: " I want to get back as much as I can before going home."  Husband states:" She is doing well already we are so happy about that."  [employer]: Other (Comment) Premorbid Home Care/DME Agencies: Other (Comment)([facility] followed before and DME) Transportation available at discharge: Family Resource referrals recommended: Neuropsychology, Support group (specify)  Discharge Planning Living Arrangements: Spouse/significant other, Children Support Systems: Spouse/significant other, Children, Other relatives, Friends/neighbors, Church/faith community Type of Residence: Private residence Insurance Resources: Multimedia programmer (specify)([employer]) Financial Resources: Family Support, SSD(pending) Financial Screen Referred: No Living Expenses: Own Money Management: Spouse Does the patient have any problems obtaining your medications?: No Home Management: All  of them Patient/Family Preliminary Plans: Return home with husband and daughter, husband arranging to be there for a short time. Getting FMLA paperwork completed. Will be short length of stay due to high level, pt happy about this and ready to get back home. Social Work Anticipated Follow Up Needs: HH/OP, Support Group  Clinical Impression Pleasant  female who was here last year for rehab and did very well. She is here again after the same surgery. She is doing well and high level, so will be a short length of stay. Husband is having FMLA papers completed so he can be there with her at discharge. Await therapy evaluations, has equipment from last time.   Elease Hashimoto 05/29/2018, 1:35 PM

## 2018-05-29 NOTE — Evaluation (Signed)
Occupational Therapy Assessment and Plan  Patient Details  Name: Carly [name] MRN: [id number] Date of Birth: 06/16/1971  OT Diagnosis: cognitive deficits and muscle weakness (generalized) Rehab Potential:   ELOS: 3-5   Today's Date: 05/29/2018 OT Individual Time: 5631-4970 OT Individual Time Calculation (min): 72 min     Problem List:  Patient Active Problem List   Diagnosis Date Noted  . Seizures ([place])   . Debility   . Hypoalbuminemia due to protein-calorie malnutrition ([place])   . History of subarachnoid hemorrhage   . Acute encephalopathy   . Hypoxemia   . Respiratory failure ([place])   . Status epilepticus ([place])   . 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 Palliative care encounter   . AKI (acute kidney injury) ([place])   . Adjustment disorder with mixed anxiety and depressed mood   . Dislodged gastrostomy tube ([place])   . Sepsis ([place])   . Hypokalemia   . Elevated temperature   . SIRS (systemic inflammatory response syndrome) (HCC)   . Decreased oral intake   . Cognitive deficit, post-stroke   . Prediabetes   . Labile blood pressure   . Transaminitis   . 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 Hydrocephalus   . Level of consciousness decreased   . SAH (subarachnoid hemorrhage) ([place])   . Tachycardia   . PEG (percutaneous endoscopic gastrostomy) status ([place])   . Acute blood loss anemia   . Tachypnea   . Ventilator dependent ([place])   . Palliative care by specialist   . Electrolyte imbalance 03/19/2017  . History of ETT   . Encounter for intubation   . Subarachnoid hemorrhage ([place])   . Acute respiratory failure ([place])   . Ruptured cerebral aneurysm ([place]) 03/03/2017  . Routine general medical examination at a health care facility 08/23/2013  . Essential hypertension, benign 08/23/2013  . Impaired fasting glucose 08/23/2013  . Plantar fasciitis, bilateral 08/23/2013  . Unspecified vitamin D deficiency 08/23/2013  . Obesity, unspecified 08/23/2013  .  Unspecified essential hypertension 12/31/2012  . GERD (gastroesophageal reflux disease) 12/31/2012  . COMMON MIGRAINE 06/16/2008  . CHEST PAIN, INTERMITTENT 06/16/2008    Past Medical History:  Past Medical History:  Diagnosis Date  . GERD (gastroesophageal reflux disease)   . Hypertension   . Memory difficulties    short term memory  . Migraines   . Seizures ([place])    with hemorrhage  . Subarachnoid hemorrhage (HCC)    d/t burst aneurysm, had seizures   Past Surgical History:  Past Surgical History:  Procedure Laterality Date  . CESAREAN SECTION    . ESOPHAGOGASTRODUODENOSCOPY N/A 04/01/2017   Procedure: ESOPHAGOGASTRODUODENOSCOPY (EGD);  Surgeon: Georganna Skeans, MD;  Location: [facility];  Service: General;  Laterality: N/A;  . IR ANGIO INTRA EXTRACRAN SEL INTERNAL CAROTID UNI L MOD SED  05/15/2018  . IR ANGIO INTRA EXTRACRAN SEL INTERNAL CAROTID UNI R MOD SED  03/03/2017  . IR ANGIO VERTEBRAL SEL SUBCLAVIAN INNOMINATE BILAT MOD SED  02/10/2018  . IR ANGIO VERTEBRAL SEL VERTEBRAL BILAT MOD SED  03/03/2017  . IR ANGIO VERTEBRAL SEL VERTEBRAL BILAT MOD SED  02/10/2018  . IR ANGIOGRAM FOLLOW UP STUDY  03/03/2017  . IR ANGIOGRAM FOLLOW UP STUDY  03/03/2017  . IR ANGIOGRAM FOLLOW UP STUDY  03/03/2017  . IR ANGIOGRAM FOLLOW UP STUDY  03/03/2017  . IR ANGIOGRAM FOLLOW UP STUDY  05/15/2018  . IR REPLC GASTRO/COLONIC TUBE PERCUT W/FLUORO  04/15/2017  . IR TRANSCATH/EMBOLIZ  03/03/2017  .  IR TRANSCATH/EMBOLIZ  05/15/2018  . IR US GUIDE VASC ACCESS RIGHT  05/15/2018  . LAPAROSCOPY     in early 90s  . PEG PLACEMENT N/A 04/01/2017   Procedure: PERCUTANEOUS ENDOSCOPIC GASTROSTOMY (PEG) PLACEMENT;  Surgeon: Georganna Skeans, MD;  Location: [facility];  Service: General;  Laterality: N/A;  . RADIOLOGY WITH ANESTHESIA N/A 03/03/2017   Procedure: RADIOLOGY WITH ANESTHESIA;  Surgeon: Consuella Lose, MD;  Location: [facility];  Service: Radiology;  Laterality: N/A;  . RADIOLOGY WITH ANESTHESIA N/A  05/15/2018   Procedure: Arteriogram, Embolization of recurrent aneurysm;  Surgeon: Consuella Lose, MD;  Location: [facility];  Service: Radiology;  Laterality: N/A;    Assessment & Plan Clinical Impression: a 47 year old right-handed female with history of subarachnoid hemorrhage underwent coil embolization of supraclinoid left internal carotid artery aneurysm June 2018 and received inpatient rehab services 7/25/2018until 04/23/2017. Per chart review patient lives with spouse and 71 year old daughter in [place]. Independent prior to admission. One level home with 3 steps to entry. Patient recently underwent follow-up diagnostic cerebral angiogram that showed enlargement of residual aneurysm. She was admitted 05/15/2018 and underwent embolization of residual aneurysm 05/15/2018 per Dr. Kathyrn Sheriff. Postoperative seizure right side weakness and neglect. Received additional 500 mg of Keppra as she was already on at thousand milligrams twice daily. Initial fall cranial CT scan reviewed, unremarkable for acute intracranial process. EEG showed at least 4 left hemispheric subclinical seizures recorded. MRI of the brain showed small region of cortical T2 flair hyperintensity centered within the left posterior temporal lobe with a few areas of reduced diffusion. Findings probably represented seizure-like activity. 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 Presently on Plavix and aspirin for CVA prophylaxisas prior to admission. Subcutaneous heparin for DVT prophylaxisinitiated 05/20/2018. She continues on Vimpat, Keppra as well as Trileptal for seizure disorder. Therapy evaluations completed with recommendations of physical medicine rehab consult..    Patient currently requires supervision with basic self-care skills secondary to muscle weakness, decreased cardiorespiratoy endurance  and decreased standing balance and decreased balance strategies.  Prior to hospitalization, patient could complete BADL with independent .  Patient will benefit from skilled intervention to increase independence with basic self-care skills prior to discharge home independently.  Anticipate patient will require intermittent supervision and no further OT follow recommended.  OT - End of Session Endurance Deficit: Yes OT Assessment Rehab Potential (ACUTE ONLY): Excellent OT Patient demonstrates impairments in the following area(s): Balance;Endurance OT Basic ADL's Functional Problem(s): Grooming;Bathing;Toileting OT Advanced ADL's Functional Problem(s): Light Housekeeping;Simple Meal Preparation OT Transfers Functional Problem(s): Tub/Shower;Toilet OT Plan OT Intensity: Minimum of 1-2 x/day, 45 to 90 minutes OT Frequency: 5 out of 7 days OT Duration/Estimated Length of Stay: 3-5 OT Treatment/Interventions: Balance/vestibular training;Discharge planning;Self Care/advanced ADL retraining;Therapeutic Activities;UE/LE Coordination activities;Cognitive remediation/compensation;Disease mangement/prevention;Functional mobility training;Patient/family education;Therapeutic Exercise;Community reintegration;DME/adaptive equipment instruction;Neuromuscular re-education;Psychosocial support;UE/LE Strength taining/ROM OT Self Feeding Anticipated Outcome(s): no goal OT Basic Self-Care Anticipated Outcome(s): MOD I OT Toileting Anticipated Outcome(s): MOD I OT Bathroom Transfers Anticipated Outcome(s): MOD I OT Recommendation Patient destination: Home Follow Up Recommendations: Home health OT Equipment Recommended: None recommended by OT   Skilled Therapeutic Intervention 1;1. Pt educated on role/puropse of OT, CIR and ELOS. Pt overall supervision for all mobility and ADLs. Pt demo mildly slow processing with higher complex tasks however able to complete all ADLS at sit to stand level with supervision at  sink. Pt gathers glothing from bag with  set up to bring pt bag to EOB. Pt bathes standing at sink for UB/peri area and grooming. Pt bathes LB sitting with supervision reaching down to feet. Pt able to don clothing with supervision, but requires cue to notice/respond to water still running. Pt completes step over tub transfer with supervision/cueing to use only grab bar she has at home. Simulates toileting completed at supervision level. Pt educated on long handled cleaning equipment as cleaning kitchen is a personal goal for pt. Exited session with pt seated in w/c,call light in reach and all needs met.  OT Evaluation Precautions/Restrictions  Precautions Precautions: Fall Restrictions Weight Bearing Restrictions: No General Chart Reviewed: Yes Vital Signs   Pain Pain Assessment Pain Score: 0-No pain Home Living/Prior Functioning Home Living Family/patient expects to be discharged to:: Private residence Living Arrangements: Spouse/significant other Available Help at Discharge: Family, Available 24 hours/day Type of Home: House Home Access: Stairs to enter Technical brewer of Steps: 3 Entrance Stairs-Rails: Left Home Layout: One level Bathroom Shower/Tub: Government social research officer Accessibility: Yes Additional Comments: has TTB and BSC  Lives With: Spouse, Family IADL History Homemaking Responsibilities: Yes Meal Prep Responsibility: Secondary Laundry Responsibility: Secondary Cleaning Responsibility: Secondary Bill Paying/Finance Responsibility: Secondary Shopping Responsibility: Secondary Child Care Responsibility: Secondary Current License: Yes Mode of Transportation: Car Leisure and Hobbies: shopping Prior Function Level of Independence: Independent with basic ADLs, Independent with homemaking with ambulation, Independent with gait  Able to Take Stairs?: Yes Driving: Yes Vocation: On disability Comments: visits family ADL    Vision Baseline Vision/History: Wears glasses Wears Glasses: Reading only Patient Visual Report: No change from baseline Vision Assessment?: No apparent visual deficits Perception  Perception: Within Functional Limits Praxis Praxis: Intact Cognition Overall Cognitive Status: Impaired/Different from baseline Arousal/Alertness: Awake/alert Orientation Level: Person;Place;Situation Person: Oriented Place: Oriented Situation: Oriented Year: 2019 Month: September Day of Week: Correct Memory: Appears intact Immediate Memory Recall: Sock;Blue;Bed Memory Recall: Sock;Blue Memory Recall Sock: Without Cue Memory Recall Blue: Without Cue Memory Recall Bed: Without Cue Awareness: Appears intact Safety/Judgment: Appears intact Sensation Sensation Light Touch: Appears Intact Proprioception: Appears Intact Coordination Gross Motor Movements are Fluid and Coordinated: Yes Fine Motor Movements are Fluid and Coordinated: Yes Finger Nose Finger Test: [facility] Motor  Motor Motor: Within Functional Limits Mobility  Bed Mobility Bed Mobility: Rolling Right;Rolling Left;Supine to Sit;Sit to Supine Rolling Right: Independent with assistive device Rolling Left: Independent with assistive device Supine to Sit: Independent with assistive device Sit to Supine: Independent with assistive device Transfers Sit to Stand: Supervision/Verbal cueing  Trunk/Postural Assessment  Cervical Assessment Cervical Assessment: Within Functional Limits Thoracic Assessment Thoracic Assessment: Within Functional Limits Lumbar Assessment Lumbar Assessment: Within Functional Limits Postural Control Postural Control: Within Functional Limits  Balance Standardized Balance Assessment Standardized Balance Assessment: Berg Balance Test Berg Balance Test Sit to Stand: Able to stand without using hands and stabilize independently Standing Unsupported: Able to stand safely 2 minutes Sitting with Back Unsupported  but Feet Supported on Floor or Stool: Able to sit safely and securely 2 minutes Stand to Sit: Sits safely with minimal use of hands Transfers: Able to transfer safely, minor use of hands Standing Unsupported with Eyes Closed: Able to stand 10 seconds safely Standing Ubsupported with Feet Together: Able to place feet together independently and stand 1 minute safely From Standing, Reach Forward with Outstretched Arm: Can reach confidently >25 cm (10") From Standing Position, Pick up Object from Floor: Able to pick up shoe safely and easily From Standing Position, Turn  to Look Behind Over each Shoulder: Looks behind one side only/other side shows less weight shift Turn 360 Degrees: Able to turn 360 degrees safely but slowly Standing Unsupported, Alternately Place Feet on Step/Stool: Able to stand independently and safely and complete 8 steps in 20 seconds Standing Unsupported, One Foot in Front: Able to place foot tandem independently and hold 30 seconds Standing on One Leg: Able to lift leg independently and hold 5-10 seconds Total Score: 52 Extremity/Trunk Assessment RUE Assessment Active Range of Motion (AROM) Comments: generalized weakness LUE Assessment Active Range of Motion (AROM) Comments: generalized weakness   See Function Navigator for Current Functional Status.   Refer to Care Plan for Long Term Goals  Recommendations for other services: None    Discharge Criteria: Patient will be discharged from OT if patient refuses treatment 3 consecutive times without medical reason, if treatment goals not met, if there is a change in medical status, if patient makes no progress towards goals or if patient is discharged from hospital.  The above assessment, treatment plan, treatment alternatives and goals were discussed and mutually agreed upon: by patient  Tonny Branch 05/29/2018, 10:16 AM

## 2018-05-29 NOTE — Care Management Note (Signed)
Inpatient Rehabilitation Center Individual Statement of Services  Patient Name:  Carly [name]  Date:  05/29/2018  Welcome to the [facility].  Our goal is to provide you with an individualized program based on your diagnosis and situation, designed to meet your specific needs.  With this comprehensive rehabilitation program, you will be expected to participate in at least 3 hours of rehabilitation therapies Monday-Friday, with modified therapy programming on the weekends.  Your rehabilitation program will include the following services:  Physical Therapy (PT), Occupational Therapy (OT), Speech Therapy (ST), 24 hour per day rehabilitation nursing, Therapeutic Recreaction (TR), Case Management (Social Worker), Rehabilitation Medicine, Nutrition Services and Pharmacy Services  Weekly team conferences will be held on Wednesday to discuss your progress.  Your Social Worker will talk with you frequently to get your input and to update you on team discussions.  Team conferences with you and your family in attendance may also be held.  Expected length of stay: 3-5 days Overall anticipated outcome: independent with device  Depending on your progress and recovery, your program may change. Your Social Worker will coordinate services and will keep you informed of any changes. Your Social Worker's name and contact numbers are listed  below.  The following services may also be recommended but are not provided by the [facility]:    [facility] will be made to provide these services after discharge if needed.  Arrangements include referral to agencies that provide these services.  Your insurance has been verified to be:  [facility] Your primary doctor is:  Pension scheme manager  Pertinent information will be shared with your doctor and your insurance company.  Social Worker:  Lennart Pall, [place] or ([phone]   Information discussed with and copy given to patient by: Elease Hashimoto, 05/29/2018, 1:15 PM

## 2018-05-30 ENCOUNTER — Inpatient Hospital Stay (HOSPITAL_COMMUNITY): Payer: 59

## 2018-05-30 ENCOUNTER — Inpatient Hospital Stay (HOSPITAL_COMMUNITY): Payer: 59 | Admitting: Physical Therapy

## 2018-05-30 DIAGNOSIS — Z8679 Personal history of other diseases of the circulatory system: Secondary | ICD-10-CM

## 2018-05-30 NOTE — Progress Notes (Signed)
Physical Therapy Session Note  Patient Details  Name: Carly [name] MRN: [id number] Date of Birth: 09/26/1970  Today's Date: 05/30/2018 PT Individual Time: 3868-5488 PT Individual Time Calculation (min): 60 min   Short Term Goals: Week 1:  PT Short Term Goal 1 (Week 1): STG=LTG due to ELOS  Skilled Therapeutic Interventions/Progress Updates:   Pt in supine and initially hesitant to participate in therapy 2/2 fatigue, but agreeable w/ encouragement. Ambulated around unit w/o AD and distant supervision, >150' at a time, to work on endurance w/ functional mobility. Throughout session, ambulated >1000' in total. Occasional verbal reminders to not rely on support on rail or furniture 2/2 fatigue. For LE strengthening and global endurance, performed NuStep @ level 1 and performed standing exercises including sit<>stands, mini-squats, and knee marches. Brief seated rest in between exercises, able to perform all safely w/ supervision and w/o UE support. Returned to room and pt performed toilet transfer w/ supervision, supervision for LE garment management and pericare as well. Ended session in supine, call bell within reach and all needs met. Missed 15 min of skilled PT 2/2 fatigue, pt requesting to return to room to lay down.   Therapy Documentation Precautions:  Precautions Precautions: Fall Restrictions Weight Bearing Restrictions: No   See Function Navigator for Current Functional Status.   Therapy/Group: Individual Therapy  Aragon Scarantino K Arnette 05/30/2018, 4:51 PM

## 2018-05-30 NOTE — Progress Notes (Signed)
[place] PHYSICAL MEDICINE & REHABILITATION     PROGRESS NOTE  Subjective/Complaints:  Patient seen lying in bed this morning. She states she slept well overnight. She states she had a good first in therapies yesterday.  ROS: denies CP, SOB, nausea, vomiting, diarrhea.  Objective: Vital Signs: Blood pressure 132/80, pulse 77, temperature 98.4 F (36.9 C), temperature source Oral, resp. rate 18, height 5\' 4"  (1.626 m), weight 89.2 kg, SpO2 97 %. No results found. Recent Labs    05/28/18 1615 05/29/18 0414  WBC 6.0 5.7  HGB 10.9* 10.4*  HCT 34.8* 33.2*  PLT 319 301   Recent Labs    05/28/18 1615 05/29/18 0414  NA  --  142  K  --  3.5  CL  --  107  GLUCOSE  --  102*  BUN  --  8  CREATININE 0.87 0.96  CALCIUM  --  8.3*   CBG (last 3)  Recent Labs    05/28/18 0411 05/28/18 0748 05/28/18 1204  GLUCAP 97 98 114*    Wt Readings from Last 3 Encounters:  05/28/18 89.2 kg  05/28/18 89.2 kg  04/20/18 113.1 kg    Physical Exam:  BP 132/80 (BP Location: Left Arm)   Pulse 77   Temp 98.4 F (36.9 C) (Oral)   Resp 18   Ht 5\' 4"  (1.626 m)   Wt 89.2 kg   SpO2 97%   BMI 33.75 kg/m  Gen.: NAD. Obese. HENT: Normocephalic. Atraumatic. Eyes:EOMare normal. Novdischarge.  Cardiovascular:RRR. No JVD. Respiratory:Effort normal and breath sounds normal.  OM:VEHMC sounds are normal. She exhibitsno distension.  Musculoskeletal: lower extremity edema Neurological: She isalert. Makes good eye contact with examiner.  Follows simple commands.  Motor: Grossly 4/5 throughout, improving Skin: Warm and dry. Intact. Psych: Normal mood and affect.  Assessment/Plan: 1. Functional deficits secondary to seizures which require 3+ hours per day of interdisciplinary therapy in a comprehensive inpatient rehab setting. Physiatrist is providing close team supervision and 24 hour management of active medical problems listed below. Physiatrist and rehab team continue to assess  barriers to discharge/monitor patient progress toward functional and medical goals.  Function:  Bathing Bathing position   Position: Wheelchair/chair at sink  Bathing parts Body parts bathed by patient: Right arm, Left arm, Chest, Abdomen, Front perineal area, Buttocks, Right upper leg, Left upper leg, Right lower leg, Left lower leg Body parts bathed by helper: Back  Bathing assist Assist Level: Supervision or verbal cues      Upper Body Dressing/Undressing Upper body dressing   What is the patient wearing?: Pull over shirt/dress     Pull over shirt/dress - Perfomed by patient: Thread/unthread right sleeve, Thread/unthread left sleeve, Put head through opening, Pull shirt over trunk          Upper body assist Assist Level: Set up      Lower Body Dressing/Undressing Lower body dressing   What is the patient wearing?: Pants, Non-skid slipper socks, Shoes, Underwear Underwear - Performed by patient: Thread/unthread right underwear leg, Thread/unthread left underwear leg, Pull underwear up/down   Pants- Performed by patient: Thread/unthread right pants leg, Thread/unthread left pants leg, Pull pants up/down, Fasten/unfasten pants   Non-skid slipper socks- Performed by patient: Don/doff right sock, Don/doff left sock       Shoes - Performed by patient: Don/doff right shoe, Don/doff left shoe, Fasten right, Fasten left            Lower body assist Assist for lower body dressing: Supervision  or verbal cues      Toileting Toileting   Toileting steps completed by patient: Adjust clothing prior to toileting, Performs perineal hygiene, Adjust clothing after toileting   Toileting Assistive Devices: Grab bar or rail  Toileting assist Assist level: Supervision or verbal cues   Transfers Chair/bed transfer     Chair/bed transfer assist level: Supervision or verbal cues       Locomotion Ambulation     Max distance: 145ft  Assist level: Supervision or verbal cues    Wheelchair     Max wheelchair distance: 15-ft  Assist Level: Supervision or verbal cues  Cognition Comprehension Comprehension assist level: Follows complex conversation/direction with extra time/assistive device  Expression Expression assist level: Expresses complex ideas: With extra time/assistive device  Social Interaction Social Interaction assist level: Interacts appropriately with others with medication or extra time (anti-anxiety, antidepressant).  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 DVT Prophylaxis/Anticoagulation: Subcutaneous heparin. 3. Pain Management:Hydrocodone as needed 4. Mood:Provide emotional support 5. Neuropsych: This patientiscapable of making decisions on herown behalf. 6. Skin/Wound Care:Routine skin checks 7. Fluids/Electrolytes/Nutrition:Routine in and outs   BMP within acceptable range on 9/13 8.Seizure disorder. Vimpat 200 mg twice daily, Keppra 1500 mg twice daily, Trileptal 750mg  twice daily, monitor for seizure recurrence  No reported seizures 9.GERD. Protonix 10. Hypoalbuminemia  Supplement initiated on 9/13 11. Acute blood loss anemia  Hemoglobin 10.4 on 9/13  Continue to monitor 12. Obesity  Encourage weight loss  LOS (Days) 2 A FACE TO FACE EVALUATION WAS PERFORMED  Ankit Lorie Phenix 05/30/2018 1:00 PM

## 2018-05-30 NOTE — Progress Notes (Signed)
Occupational Therapy Session Note  Patient Details  Name: Carly [name] MRN: [id number] Date of Birth: [birth year]  Today's Date: 05/30/2018 OT Individual Time: 6599-7877 OT Individual Time Calculation (min): 60 min   AND  1103-1200 57 min  Short Term Goals: Week 1:  OT Short Term Goal 1 (Week 1): no goal d/t ELOS  Skilled Therapeutic Interventions/Progress Updates:    1:1. Pt reporting wanting to bathe after PT session this evening. Pt completes all ambulation throughout session iwht No AD and supervision. Pt strips bed, gathers clean sheets with VC for recalling how many pilow cases were needed and remakes bed with supervision/VC for orientation of fitted sheet. Pt ambulates in hallway while dribbling basketball and naming animals in alphabetical order for dual task processing. Pt demo significnatly decreased gait speed with cognitive demand of task often stopping trying to think of animal name. Pt completes activity of making no sew blanket for FMC/coordination while standing for activity tolerance with supervision cutting strips of fabric and tieing knots. Exited session iwht ptseated in recliner, call light in reach and all needs met  Session 2: 1;1. Pt ambulates to gift shop. No c/o pain. Pt completes ambulation around gift shop searching for items locating 5/5 items with supervision and min question cueing for efficient searching strategy. Pt able to write list of items as memory strategy. Pt able to find way back to room with no VC. Pt bathes sit to stand at sink with supervision and VC for sitting to wash feet/thread BLE into pants for safety. Pt toilets with distant supervision for BM on toilet. Exited session with pt seated in w/c, call light in reach and all needs met  Therapy Documentation Precautions:  Precautions Precautions: Fall Restrictions Weight Bearing Restrictions: No General:   Vital Signs: Therapy Vitals Temp: 98.4 F (36.9 C) Temp Source: Oral Pulse Rate:  77 Resp: 18 BP: 132/80 Patient Position (if appropriate): Lying Oxygen Therapy SpO2: 97 % O2 Device: Room Air  See Function Navigator for Current Functional Status.   Therapy/Group: Individual Therapy  Tonny Branch 05/30/2018, 9:39 AM

## 2018-05-31 ENCOUNTER — Inpatient Hospital Stay (HOSPITAL_COMMUNITY): Payer: 59 | Admitting: Physical Therapy

## 2018-05-31 NOTE — Progress Notes (Signed)
[facility] PHYSICAL MEDICINE & REHABILITATION     PROGRESS NOTE  Subjective/Complaints:  Patient seen laying in bed this morning.  She states he slept well overnight.  She states she is a good forward to her day of relative rest.  ROS: Denies CP, SOB, nausea, vomiting, diarrhea.  Objective: Vital Signs: Blood pressure 139/82, pulse 83, temperature 98.5 F (36.9 C), temperature source Oral, resp. rate 18, height 5\' 4"  (1.626 m), weight 89.2 kg, SpO2 99 %. No results found. Recent Labs    05/28/18 1615 05/29/18 0414  WBC 6.0 5.7  HGB 10.9* 10.4*  HCT 34.8* 33.2*  PLT 319 301   Recent Labs    05/28/18 1615 05/29/18 0414  NA  --  142  K  --  3.5  CL  --  107  GLUCOSE  --  102*  BUN  --  8  CREATININE 0.87 0.96  CALCIUM  --  8.3*   CBG (last 3)  No results for input(s): GLUCAP in the last 72 hours.  Wt Readings from Last 3 Encounters:  05/28/18 89.2 kg  05/28/18 89.2 kg  04/20/18 113.1 kg    Physical Exam:  BP 139/82 (BP Location: Left Wrist)   Pulse 83   Temp 98.5 F (36.9 C) (Oral)   Resp 18   Ht 5\' 4"  (1.626 m)   Wt 89.2 kg   SpO2 99%   BMI 33.75 kg/m  Gen.: NAD. Obese. HENT: Normocephalic. Atraumatic. Eyes:EOMare normal. Novdischarge.  Cardiovascular:RRR.  No JVD. Respiratory:Effort normal and breath sounds normal.  ZO:XWRUE sounds are normal. She exhibitsno distension.  Musculoskeletal: lower extremity edema Neurological: She isalert. Makes good eye contact with examiner.  Follows simple commands.  Motor: Grossly 4-4+/5 throughout Skin: Warm and dry. Intact. Psych: Normal mood and affect.  Assessment/Plan: 1. Functional deficits secondary to seizures which require 3+ hours per day of interdisciplinary therapy in a comprehensive inpatient rehab setting. Physiatrist is providing close team supervision and 24 hour management of active medical problems listed below. Physiatrist and rehab team continue to assess barriers to discharge/monitor  patient progress toward functional and medical goals.  Function:  Bathing Bathing position   Position: Wheelchair/chair at sink  Bathing parts Body parts bathed by patient: Right arm, Left arm, Chest, Abdomen, Front perineal area, Buttocks, Right upper leg, Left upper leg, Right lower leg, Left lower leg Body parts bathed by helper: Back  Bathing assist Assist Level: Supervision or verbal cues      Upper Body Dressing/Undressing Upper body dressing   What is the patient wearing?: Pull over shirt/dress     Pull over shirt/dress - Perfomed by patient: Thread/unthread right sleeve, Thread/unthread left sleeve, Put head through opening, Pull shirt over trunk          Upper body assist Assist Level: Set up      Lower Body Dressing/Undressing Lower body dressing   What is the patient wearing?: Pants, Non-skid slipper socks, Shoes, Underwear Underwear - Performed by patient: Thread/unthread right underwear leg, Thread/unthread left underwear leg, Pull underwear up/down   Pants- Performed by patient: Thread/unthread right pants leg, Thread/unthread left pants leg, Pull pants up/down, Fasten/unfasten pants   Non-skid slipper socks- Performed by patient: Don/doff right sock, Don/doff left sock       Shoes - Performed by patient: Don/doff right shoe, Don/doff left shoe, Fasten right, Fasten left            Lower body assist Assist for lower body dressing: Supervision or verbal cues  Toileting Toileting   Toileting steps completed by patient: Adjust clothing prior to toileting, Performs perineal hygiene, Adjust clothing after toileting   Toileting Assistive Devices: Grab bar or rail  Toileting assist Assist level: Supervision or verbal cues   Transfers Chair/bed transfer   Chair/bed transfer method: Ambulatory Chair/bed transfer assist level: Supervision or verbal cues       Locomotion Ambulation     Max distance: 11' Assist level: Supervision or verbal cues    Wheelchair     Max wheelchair distance: 15-ft  Assist Level: Supervision or verbal cues  Cognition Comprehension Comprehension assist level: Follows complex conversation/direction with extra time/assistive device  Expression Expression assist level: Expresses complex ideas: With extra time/assistive device  Social Interaction Social Interaction assist level: Interacts appropriately with others with medication or extra time (anti-anxiety, antidepressant).  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 DVT Prophylaxis/Anticoagulation: Subcutaneous heparin. 3. Pain Management:Hydrocodone as needed 4. Mood:Provide emotional support 5. Neuropsych: This patientiscapable of making decisions on herown behalf. 6. Skin/Wound Care:Routine skin checks 7. Fluids/Electrolytes/Nutrition:Routine in and outs   BMP within acceptable range on 9/13 8.Seizure disorder. Vimpat 200 mg twice daily, Keppra 1500 mg twice daily, Trileptal 750mg  twice daily, monitor for seizure recurrence  No reported seizures on 9/15 9.GERD. Protonix 10. Hypoalbuminemia  Supplement initiated on 9/13 11. Acute blood loss anemia  Hemoglobin 10.4 on 9/13  Continue to monitor 12. Obesity  Encourage weight loss  LOS (Days) 3 A FACE TO FACE EVALUATION WAS PERFORMED  Aadil Sur Lorie Phenix 05/31/2018 2:45 PM

## 2018-05-31 NOTE — Progress Notes (Signed)
Physical Therapy Session Note  Patient Details  Name: Carly [name] MRN: [id number] Date of Birth: [birth year]  Today's Date: 05/31/2018 PT Individual Time: 8921-1941 PT Individual Time Calculation (min): 45 min   Short Term Goals: Week 1:  PT Short Term Goal 1 (Week 1): STG=LTG due to ELOS  Skilled Therapeutic Interventions/Progress Updates:   Pt in supine and agreeable to therapy, denies pain and is requesting to take a shower as she just got her PICC line removed. Covered PICC line dressing w/ plastic sleeve to avoid moisture. Pt ambulated around room to gather clothing and collect shower items, toileted, and took shower primarily from seated level, all w/ supervision. Provided verbal cues for energy conservation strategies while taking shower and safety reminders when donning and doffing clothing. She performed multiple sit<>stands from shower seat w/ supervision as well. PICC line dressing remained dry afterwards. Ended session sitting EOB and in care of husband, all needs met.   Therapy Documentation Precautions:  Precautions Precautions: Fall Restrictions Weight Bearing Restrictions: No  See Function Navigator for Current Functional Status.   Therapy/Group: Individual Therapy  Reagen Haberman K Arnette 05/31/2018, 12:32 PM

## 2018-05-31 NOTE — Progress Notes (Signed)
Physical Therapy Session Note  Patient Details  Name: Carly [name] MRN: [id number] Date of Birth: [birth year]  Today's Date: 05/31/2018 PT Individual Time: 3474-2595 PT Individual Time Calculation (min): 42 min   Short Term Goals: Week 1:  PT Short Term Goal 1 (Week 1): STG=LTG due to ELOS  Skilled Therapeutic Interventions/Progress Updates:  Pt was seen bedside in the pm. Pt transferred supine to edge of bed with no assist. Pt performed all transfers with S. Pt ambulated distances of 150, 125 and 275 feet with no assistive device and S. In gym treatment focused on NMR utilizing cone taps and alternating cone taps 3 sets x 10 reps each. Pt ambulated through slalom course 60 feet x 3 with S. Pt rode Nu-step x 10 minutes at level 3 with no rest break. Pt returned to room and left sitting on edge of bed with husband at bedside.   Therapy Documentation Precautions:  Precautions Precautions: Fall Restrictions Weight Bearing Restrictions: No General:   Pain: No c/o pain.   See Function Navigator for Current Functional Status.   Therapy/Group: Individual Therapy  Dub Amis 05/31/2018, 3:47 PM

## 2018-06-01 ENCOUNTER — Inpatient Hospital Stay (HOSPITAL_COMMUNITY): Payer: 59 | Admitting: Occupational Therapy

## 2018-06-01 ENCOUNTER — Inpatient Hospital Stay (HOSPITAL_COMMUNITY): Payer: 59 | Admitting: Physical Therapy

## 2018-06-01 ENCOUNTER — Inpatient Hospital Stay (HOSPITAL_COMMUNITY): Payer: 59 | Admitting: Speech Pathology

## 2018-06-01 DIAGNOSIS — R030 Elevated blood-pressure reading, without diagnosis of hypertension: Secondary | ICD-10-CM

## 2018-06-01 NOTE — Progress Notes (Addendum)
PHYSICAL MEDICINE & REHABILITATION     PROGRESS NOTE  Subjective/Complaints:  Patient seen standing at the sink unassisted this AM.  He slept well overnight.   ROS: Denies CP, SOB, nausea, vomiting, diarrhea.  Objective: Vital Signs: Blood pressure (!) 142/92, pulse 85, temperature 98.4 F (36.9 C), temperature source Oral, resp. rate 16, height 5\' 4"  (1.626 m), weight 89.2 kg, SpO2 98 %. No results found. No results for input(s): WBC, HGB, HCT, PLT in the last 72 hours. No results for input(s): NA, K, CL, GLUCOSE, BUN, CREATININE, CALCIUM in the last 72 hours.  Invalid input(s): CO CBG (last 3)  No results for input(s): GLUCAP in the last 72 hours.  Wt Readings from Last 3 Encounters:  05/28/18 89.2 kg  05/28/18 89.2 kg  04/20/18 113.1 kg    Physical Exam:  BP (!) 142/92 (BP Location: Left Arm)   Pulse 85   Temp 98.4 F (36.9 C) (Oral)   Resp 16   Ht 5\' 4"  (1.626 m)   Wt 89.2 kg   SpO2 98%   BMI 33.75 kg/m  Gen.: NAD. Obese. HENT: Normocephalic. Atraumatic. Eyes:EOMare normal. Novdischarge.  Cardiovascular:RRR. No JVD. Respiratory:Effort normal and breath sounds normal.  KG:MWNUU sounds are normal. She exhibitsno distension.  Musculoskeletal: lower extremity edema, improved Neurological: She isalert. Makes good eye contact with examiner.  Follows simple commands.  Motor: Grossly 4-4+/5 throughout, unchanged Skin: Warm and dry. Intact. Psych: Normal mood and affect.  Assessment/Plan: 1. Functional deficits secondary to seizures which require 3+ hours per day of interdisciplinary therapy in a comprehensive inpatient rehab setting. Physiatrist is providing close team supervision and 24 hour management of active medical problems listed below. Physiatrist and rehab team continue to assess barriers to discharge/monitor patient progress toward functional and medical goals.  Function:  Bathing Bathing position   Position: Wheelchair/chair at  sink  Bathing parts Body parts bathed by patient: Right arm, Left arm, Chest, Abdomen, Front perineal area, Buttocks, Right upper leg, Left upper leg, Right lower leg, Left lower leg Body parts bathed by helper: Back  Bathing assist Assist Level: Supervision or verbal cues      Upper Body Dressing/Undressing Upper body dressing   What is the patient wearing?: Pull over shirt/dress     Pull over shirt/dress - Perfomed by patient: Thread/unthread right sleeve, Thread/unthread left sleeve, Put head through opening, Pull shirt over trunk          Upper body assist Assist Level: Set up      Lower Body Dressing/Undressing Lower body dressing   What is the patient wearing?: Pants, Non-skid slipper socks, Shoes, Underwear Underwear - Performed by patient: Thread/unthread right underwear leg, Thread/unthread left underwear leg, Pull underwear up/down   Pants- Performed by patient: Thread/unthread right pants leg, Thread/unthread left pants leg, Pull pants up/down, Fasten/unfasten pants   Non-skid slipper socks- Performed by patient: Don/doff right sock, Don/doff left sock       Shoes - Performed by patient: Don/doff right shoe, Don/doff left shoe, Fasten right, Fasten left            Lower body assist Assist for lower body dressing: Supervision or verbal cues      Toileting Toileting   Toileting steps completed by patient: Adjust clothing prior to toileting, Performs perineal hygiene, Adjust clothing after toileting   Toileting Assistive Devices: Grab bar or rail  Toileting assist Assist level: Supervision or verbal cues   Transfers Chair/bed transfer   Chair/bed transfer method: Ambulatory  Chair/bed transfer assist level: Supervision or verbal cues       Locomotion Ambulation     Max distance: 275 Assist level: Supervision or verbal cues   Wheelchair     Max wheelchair distance: 15-ft  Assist Level: Supervision or verbal cues  Cognition Comprehension  Comprehension assist level: Follows complex conversation/direction with extra time/assistive device  Expression Expression assist level: Expresses complex ideas: With extra time/assistive device  Social Interaction Social Interaction assist level: Interacts appropriately with others with medication or extra time (anti-anxiety, antidepressant).  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 DVT Prophylaxis/Anticoagulation: Subcutaneous heparin. 3. Pain Management:Hydrocodone as needed 4. Mood:Provide emotional support 5. Neuropsych: This patientiscapable of making decisions on herown behalf. 6. Skin/Wound Care:Routine skin checks 7. Fluids/Electrolytes/Nutrition:Routine in and outs   BMP within acceptable range on 9/13 8.Seizure disorder. Vimpat 200 mg twice daily, Keppra 1500 mg twice daily, Trileptal 750mg  twice daily, monitor for seizure recurrence  No reported seizures since admission 9.GERD. Protonix 10. Hypoalbuminemia  Supplement initiated on 9/13 11. Acute blood loss anemia  Hemoglobin 10.4 on 9/13  Continue to monitor 12. Obesity  Encourage weight loss 13. Elevated blood pressure  Controlled on 9/17  LOS (Days) 4 A FACE TO FACE EVALUATION WAS PERFORMED  Natajah Derderian Lorie Phenix 06/01/2018 8:30 AM

## 2018-06-01 NOTE — Discharge Summary (Signed)
NAME: Carly [name], Carly [name] MEDICAL RECORD BJ:[id number] ACCOUNT [id number] DATE OF BIRTH:03/18/1971 FACILITY: [facility]ATION[facility] PHYSICIAN:ANKIT PATEL, MD  DISCHARGE SUMMARY  DATE OF DISCHARGE:  06/02/2018  DISCHARGE DIAGNOSES:  1.  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   2.  Subcutaneous heparin for DVT prophylaxis.   3.  Seizure disorder disease.   4.  Gastroesophageal reflux disease.   5.  Acute on chronic anemia. 6.  Obesity.  HISTORY OF PRESENT ILLNESS:  This is a 47 year old right-handed female with history of subarachnoid hemorrhage with coil embolization 02/2017.  Received inpatient rehabilitation services.  She lives with spouse and 59 year old daughter.  One level home.   Recently underwent followup diagnostic cerebral angiogram showed enlargement of residual aneurysm.  She was admitted 05/15/2018 and underwent reembolization of residual aneurysm 05/15/2018 per Dr. Kathyrn Sheriff.  Postoperative seizure, right-sided weakness and neglect.  Received an additional 500 mg of Keppra.  Initial cranial CT scan unremarkable for acute intracranial process.  EKG showed at least for left hemispheric subclinical seizures recorded.  MRI of the  brain showed small region of cortical T2 FLAIR hyperintensity centered within the left posterior temporal lobe with a few areas of reduced diffusion.  Findings most likely representing seizure-like activity.  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  Maintained on Plavix for CVA prophylaxis prior to admission.  Subcutaneous heparin for DVT prophylaxis.    The patient was admitted for comprehensive rehab program.  PAST MEDICAL HISTORY:  See discharge diagnoses.  SOCIAL HISTORY:  Lives with spouse and 58 year old daughter.  Reported to be independent prior to admission.  FUNCTIONAL STATUS:  Upon  admission to rehab services, minimal guard 120 feet, minimal guard sit to stand, minimal assist activities of daily living.  PHYSICAL EXAMINATION: VITAL SIGNS:  Blood pressure 141/87, pulse 88, temperature 98, respirations 15. GENERAL:  Alert female in no acute distress, made good eye contact with examiner follows full commands. HEENT:  EOMs intact. NECK:  Supple, nontender, no JVD. CARDIOVASCULAR:  Rate controlled. ABDOMEN:  Soft, nontender, good bowel sounds. LUNGS:  Clear to auscultation without wheeze.  REHABILITATION HOSPITAL COURSE:  The patient was admitted to inpatient rehab services therapies initiated on a 3-hour daily basis, consisting of physical therapy, occupational therapy and rehabilitation nursing as well as speech therapy.  The following  issues were addressed during patient's rehabilitation stay.  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  Would follow up with neurosurgery.  She continued to  make progressive gains.  Maintained on subcutaneous heparin for DVT prophylaxis throughout her hospital stay.  No bleeding episodes.  Hydrocodone as needed for pain.  Seizure prophylaxis with Vimpat, Keppra as well as Trileptal.  No further seizure activity noted.    Acute on chronic anemia 10.4.  No bleeding episodes.    Noted obesity, BMI 33.75.  Dietary followup.  The patient received weekly collaborative interdisciplinary team conferences to discuss estimated length of stay, family teaching, any barriers to discharge.  She can ambulate up to 275 feet without assistive  device, supervision.  Ambulates to the gymnasium.  Ambulates throughout the obstacle course with supervision, navigating stairs.  Gathers belongings for activities of daily living and home making.  Excellent overall progress.  Family teaching completed  and planned discharge to home.  DISCHARGE MEDICATIONS:  Included  aspirin 325 mg p.o. daily, Plavix 75 mg  p.o. daily, Vimpat 200 mg p.o. b.i.d., Keppra 1500 mg p.o. b.i.d., multivitamin daily, Trileptal 750 mg p.o. b.i.d., Protonix 40 mg p.o. daily, hydrocodone 1 tablet every 4 hours as  needed for pain.  Her diet was regular.  She would follow up with Dr. Delice Lesch at the outpatient rehab service office as directed; Dr. Kathyrn Sheriff neurosurgery, call for appointment.  Dr. Elza Rafter, medical management.  SPECIAL INSTRUCTIONS:  No driving.  AN/NUANCE V:85/50/1586 T:06/01/2018 JOB:[id number]

## 2018-06-01 NOTE — Progress Notes (Signed)
Occupational Therapy Discharge Summary  Patient Details  Name: Carly [name] MRN: [id number] Date of Birth: 05-Mar-1971  Today's Date: 06/01/2018 OT Individual Time: 9528-4132 OT Individual Time Calculation (min): 57 min   Session Note:  Pt completed functional mobility without use of the RW for support.  She completed simple meal prep (simulated) placing items back in various locations after therapist had moved them.  She also completed step in tub transfers with use of the wall for support.  Both tasks were performed at modified independent level.  She completed 10 mins of UE ergonometer on level 8 resistance and RPMs maintained at 26-30 throuughout.  HR increased to 104 BPM with O2 at 98% on room air after completion of UE strengthening.  Finished session with return to the room and pt sitting on the EOB.  Pt made modified independent in room with agreement from PT.   Patient has met 11 of 11 long term goals due to improved balance.  Patient to discharge at overall Independent level.  Patient's care partner is independent to provide the necessary physical assistance at discharge.    Reasons goals not met: NA  Recommendation:  Pt does not need any further OT intervention at this time secondary to being independent/modified independent.  Equipment: No equipment provided  Reasons for discharge: treatment goals met and discharge from hospital  Patient/family agrees with progress made and goals achieved: Yes  OT Discharge Precautions/Restrictions  Precautions Precautions: Fall Restrictions Weight Bearing Restrictions: No  Pain Pain Assessment Pain Score: 0-No pain ADL  See Function Section of chart for details  Vision Baseline Vision/History: Wears glasses Wears Glasses: Reading only Patient Visual Report: No change from baseline Vision Assessment?: No apparent visual deficits Perception  Perception: Within Functional Limits Praxis Praxis: Intact Cognition Overall  Cognitive Status: Within Functional Limits for tasks assessed Arousal/Alertness: Awake/alert Orientation Level: Oriented X4 Attention: Selective Selective Attention: Appears intact Memory: Appears intact Awareness: Appears intact Problem Solving: Appears intact Safety/Judgment: Appears intact Sensation Sensation Light Touch: Appears Intact Hot/Cold: Appears Intact Proprioception: Appears Intact Stereognosis: Appears Intact Coordination Gross Motor Movements are Fluid and Coordinated: Yes Fine Motor Movements are Fluid and Coordinated: Yes Finger Nose Finger Test: [facility] Motor  Motor Motor: Within Functional Limits Mobility  Transfers Sit to Stand: Independent Stand to Sit: Independent  Trunk/Postural Assessment  Cervical Assessment Cervical Assessment: Within Functional Limits Thoracic Assessment Thoracic Assessment: Within Functional Limits Lumbar Assessment Lumbar Assessment: Within Functional Limits Postural Control Postural Control: Within Functional Limits  Balance Balance Balance Assessed: Yes Static Sitting Balance Static Sitting - Balance Support: No upper extremity supported Static Sitting - Level of Assistance: 7: Independent Dynamic Sitting Balance Dynamic Sitting - Balance Support: During functional activity Dynamic Sitting - Level of Assistance: 7: Independent Static Standing Balance Static Standing - Balance Support: During functional activity Static Standing - Level of Assistance: 7: Independent Dynamic Standing Balance Dynamic Standing - Balance Support: During functional activity Dynamic Standing - Level of Assistance: 7: Independent Extremity/Trunk Assessment RUE Assessment RUE Assessment: Within Functional Limits LUE Assessment LUE Assessment: Within Functional Limits   See Function Navigator for Current Functional Status.  Laelia Angelo OTR/L 06/01/2018, 4:24 PM

## 2018-06-01 NOTE — Progress Notes (Signed)
Physical Therapy Session Note  Patient Details  Name: Carly [name] MRN: [id number] Date of Birth: [birth year]  Today's Date: 06/01/2018 PT Individual Time: 0805-0900 and 0211-1735 PT Individual Time Calculation (min): 55 min and 33 min   Short Term Goals: Week 1:  PT Short Term Goal 1 (Week 1): STG=LTG due to ELOS  Skilled Therapeutic Interventions/Progress Updates: Tx1:Pt presented in bed agreeable to therapy. Pt performed bed mobility mod I and ambulated throughout room locating clothing and adjusting bed. Pt donned socks mod I and ambulated to day room no AD. Pt participated in NuStep L2 x 10 min BLE only for strengthening and endurance. Pt demonstrated ascending/descending stairs x 4 L rail only mod I with good safety. Pt participated in scavenger hunt locating objects in hallway, high/low, picking up objects off floor. Pt participated in pipe tree activity while standing on Aire[employer]. Pt able to perform requiring min/modA for assembly. Dsicussed with pt possible d/c tomorrow as pt currently near/at goals. Discussed with LSW Lucy and OT. Pt agreeable to d/c Tuesday. Pt left in gym sitting at mat awaiting handoff to OT.   Tx2: Pt presented sitting at EOB with NT present assessing vitals. Pt agreeable to therapy. Pt ambulated throughout unit and participated in functional activities including, car transfer, bed mobility, stairs. Performed 5x STS test performed without hands 10.33 sec denoting decreased fall risk. Discussed with pt OPPT vs HHPT, pt requesting HHPT, advised LSW. Pt ambulated back to room distant supervision/mod I and left sitting at EOB with current needs met.      Therapy Documentation Precautions:  Precautions Precautions: Fall Restrictions Weight Bearing Restrictions: No General:   Vital Signs:  Pain: Pain Assessment Pain Score: 0-No pain   See Function Navigator for Current Functional Status.   Therapy/Group: Individual Therapy  Desmon Hitchner  Rochelle Larue,  PTA  06/01/2018, 12:22 PM

## 2018-06-01 NOTE — Discharge Summary (Signed)
Discharge summary job # [id number]

## 2018-06-01 NOTE — Progress Notes (Signed)
Physical Therapy Discharge Summary  Patient Details  Name: Carly [name] MRN: [id number] Date of Birth: 1971/03/24  Today's Date: 06/01/2018   Patient has met 9 of 9 long term goals due to improved activity tolerance, improved balance, improved postural control, increased strength, improved attention, improved awareness and improved coordination.  Patient to discharge at an ambulatory level Modified Independent.   Patient's care partner is independent to provide the necessary physical and cognitive assistance at discharge.  Reasons goals not met: N/A  Recommendation:  Patient will benefit from ongoing skilled PT services in home health setting to continue to advance safe functional mobility, address ongoing impairments in balance, strength, gait safety, and minimize fall risk.  Equipment: No equipment provided  Reasons for discharge: treatment goals met  Patient/family agrees with progress made and goals achieved: Yes  PT Discharge Precautions/Restrictions Restrictions Weight Bearing Restrictions: No Vital Signs Therapy Vitals Temp: 99.1 F (37.3 C) Temp Source: Oral Pulse Rate: 91 Resp: 16 BP: 132/88 Patient Position (if appropriate): Sitting Oxygen Therapy SpO2: 100 % O2 Device: Room Air Pain Pain Assessment Pain Score: 0-No pain Vision/Perception     Cognition Overall Cognitive Status: Within Functional Limits for tasks assessed Arousal/Alertness: Awake/alert Orientation Level: Oriented X4 Attention: Selective Selective Attention: Appears intact Memory: Appears intact Awareness: Appears intact Safety/Judgment: Appears intact Sensation Sensation Light Touch: Appears Intact Proprioception: Appears Intact Coordination Gross Motor Movements are Fluid and Coordinated: Yes Fine Motor Movements are Fluid and Coordinated: Yes Motor  Motor Motor: Within Functional Limits  Mobility Bed Mobility Bed Mobility: Rolling Right;Rolling Left;Supine to Sit;Sit to  Supine Rolling Right: Independent Rolling Left: Independent Supine to Sit: Independent Sit to Supine: Independent Transfers Transfers: Sit to Stand;Stand to Sit Sit to Stand: Independent Stand to Sit: Independent Transfer (Assistive device): None Locomotion  Gait Ambulation: Yes Gait Assistance: Independent with assistive device Gait Distance (Feet): 200 Feet Stairs / Additional Locomotion Stairs: Yes Stairs Assistance: Independent with assistive device Stair Management Technique: One rail Left Number of Stairs: 12 Height of Stairs: 6 Ramp: Independent with assistive device Curb: Independent with assistive device Wheelchair Mobility Wheelchair Mobility: No  Trunk/Postural Assessment  Cervical Assessment Cervical Assessment: Within Functional Limits Thoracic Assessment Thoracic Assessment: Within Functional Limits Lumbar Assessment Lumbar Assessment: Within Functional Limits Postural Control Postural Control: Within Functional Limits  Balance Balance Balance Assessed: Yes Extremity Assessment      RLE Assessment RLE Assessment: Within Functional Limits LLE Assessment LLE Assessment: Within Functional Limits   See Function Navigator for Current Functional Status.  Rosita DeChalus 06/01/2018, 3:57 PM

## 2018-06-01 NOTE — Progress Notes (Signed)
Occupational Therapy Session Note  Patient Details  Name: Carly [name] MRN: [id number] Date of Birth: 1971-08-23  Today's Date: 06/01/2018 OT Individual Time: 1856-3149 OT Individual Time Calculation (min): 43 min    Short Term Goals: Week 1:  OT Short Term Goal 1 (Week 1): no goal d/t ELOS  Skilled Therapeutic Interventions/Progress Updates:    Upon entering the room, pt seated on EOB with no c/o pain and agreeable to OT intervention. OT educated pt on current OT level goals and expectation for session in regards to discharge planning. Pt verbalized understanding. Pt ambulating in room without use of AD to obtain all needed items at mod I level. Pt transferring into shower and bathing with sit <>stand as needed in shower independently. Pt seated on commode to dress self and then cleaning bathroom at overall mod I level. No follow up needed at discharge and OT educated pt on procedure for discharge tomorrow. Pt with no further questions at this time.   Therapy Documentation Precautions:  Precautions Precautions: Fall Restrictions Weight Bearing Restrictions: No General:   Vital Signs: Therapy Vitals Temp: 99.1 F (37.3 C) Temp Source: Oral Pulse Rate: 91 Resp: 16 BP: 132/88 Patient Position (if appropriate): Sitting Oxygen Therapy SpO2: 100 % O2 Device: Room Air Pain: Pain Assessment Pain Score: 0-No pain  See Function Navigator for Current Functional Status.   Therapy/Group: Individual Therapy  Gypsy Decant 06/01/2018, 3:01 PM

## 2018-06-02 ENCOUNTER — Inpatient Hospital Stay (HOSPITAL_COMMUNITY): Payer: 59 | Admitting: Occupational Therapy

## 2018-06-02 MED ORDER — ACETAMINOPHEN 325 MG PO TABS: 650.0000 mg | ORAL_TABLET | Freq: Four times a day (QID) | ORAL | Status: AC | PRN

## 2018-06-02 MED ORDER — HYDROCODONE-ACETAMINOPHEN 5-325 MG PO TABS: 1.0000 | ORAL_TABLET | ORAL | 0 refills | Status: DC | PRN

## 2018-06-02 MED ORDER — PANTOPRAZOLE SODIUM 40 MG PO TBEC: 40.0000 mg | DELAYED_RELEASE_TABLET | Freq: Every day | ORAL | 0 refills | Status: DC

## 2018-06-02 MED ORDER — LEVETIRACETAM 750 MG PO TABS: 1500.0000 mg | ORAL_TABLET | Freq: Two times a day (BID) | ORAL | 2 refills | Status: DC

## 2018-06-02 MED ORDER — LACOSAMIDE 200 MG PO TABS: 200.0000 mg | ORAL_TABLET | Freq: Two times a day (BID) | ORAL | 2 refills | Status: DC

## 2018-06-02 MED ORDER — OXCARBAZEPINE 150 MG PO TABS: 750.0000 mg | ORAL_TABLET | Freq: Two times a day (BID) | ORAL | 1 refills | Status: DC

## 2018-06-02 MED ORDER — CLOPIDOGREL BISULFATE 75 MG PO TABS: 75.0000 mg | ORAL_TABLET | Freq: Every day | ORAL | 2 refills | Status: DC

## 2018-06-02 NOTE — Progress Notes (Signed)
Patient discharged about 1030 to home with husband. All questions answered. Patient received discharge instructions from [facility]. Patient discharged with all belongings and discharge information.

## 2018-06-02 NOTE — Plan of Care (Signed)
  Problem: Consults Goal: RH BRAIN INJURY PATIENT EDUCATION Description Description: See Patient Education module for eduction specifics Outcome: Completed/Met   Problem: RH BOWEL ELIMINATION Goal: RH STG MANAGE BOWEL WITH ASSISTANCE Description STG Manage Bowel with min.Assistance.  Outcome: Completed/Met   Problem: RH BLADDER ELIMINATION Goal: RH STG MANAGE BLADDER WITH ASSISTANCE Description STG Manage Bladder With min. Assistance  Outcome: Completed/Met   Problem: RH SKIN INTEGRITY Goal: RH STG SKIN FREE OF INFECTION/BREAKDOWN Description With min. Assist.  Outcome: Completed/Met Goal: RH STG MAINTAIN SKIN INTEGRITY WITH ASSISTANCE Description STG Maintain Skin Integrity With min.Assistance.  Outcome: Completed/Met   Problem: RH SAFETY Goal: RH STG ADHERE TO SAFETY PRECAUTIONS W/ASSISTANCE/DEVICE Description STG Adhere to Safety Precautions With min. Assistance/Device.  Outcome: Completed/Met   Problem: RH COGNITION-NURSING Goal: RH STG USES MEMORY AIDS/STRATEGIES W/ASSIST TO PROBLEM SOLVE Description STG Uses Memory Aids/Strategies With min.Assistance to Problem Solve.  Outcome: Completed/Met Goal: RH STG ANTICIPATES NEEDS/CALLS FOR ASSIST W/ASSIST/CUES Description STG Anticipates Needs/Calls for Assist With min.Assistance/Cues.  Outcome: Completed/Met   Problem: RH PAIN MANAGEMENT Goal: RH STG PAIN MANAGED AT OR BELOW PT'S PAIN GOAL Description Less than 3,on 1 to 10 scale  Outcome: Completed/Met   Problem: RH KNOWLEDGE DEFICIT BRAIN INJURY Goal: RH STG INCREASE KNOWLEDGE OF SELF CARE AFTER BRAIN INJURY Description Pt. And family will be able to verbalized the safety precaution before they go home  Outcome: Completed/Met

## 2018-06-02 NOTE — Patient Care Conference (Signed)
Inpatient RehabilitationTeam Conference and Plan of Care Update Date: 06/02/2018   Time: 1:44 PM    Patient Name: Carly [name]      Medical Record Number: [id number]  Date of Birth: 02-15-1971 Sex: Female         Room/Bed: 4M06C/4M06C-01 Payor Info: Payor: Theme park manager / Plan: [place] / Product Type: *No Product type* /    Admitting Diagnosis: Seizures  Admit Date/Time:  05/28/2018  3:10 PM Admission Comments: No comment available   Primary Diagnosis:  <principal problem not specified> Principal Problem: <principal problem not specified>  Patient Active Problem List   Diagnosis Date Noted  . Elevated blood pressure reading   . Seizures ([place])   . Debility   . Hypoalbuminemia due to protein-calorie malnutrition ([place])   . History of subarachnoid hemorrhage   . Acute encephalopathy   . Hypoxemia   . Respiratory failure ([place])   . Status epilepticus ([place])   . 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 Palliative care encounter   . AKI (acute kidney injury) ([place])   . Adjustment disorder with mixed anxiety and depressed mood   . Dislodged gastrostomy tube ([place])   . Sepsis ([place])   . Hypokalemia   . Elevated temperature   . SIRS (systemic inflammatory response syndrome) ([facility])   . Decreased oral intake   . Cognitive deficit, post-stroke   . Prediabetes   . Labile blood pressure   . Transaminitis   . 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 Hydrocephalus   . Level of consciousness decreased   . SAH (subarachnoid hemorrhage) ([place])   . Tachycardia   . PEG (percutaneous endoscopic gastrostomy) status ([place])   . Acute blood loss anemia   . Tachypnea   . Ventilator dependent ([place])   . Palliative care by specialist   . Electrolyte imbalance 03/19/2017  . History of ETT   . Encounter for intubation   . Subarachnoid hemorrhage ([place])   . Acute respiratory failure ([place])   . Ruptured cerebral aneurysm ([place]) 03/03/2017  . Routine general medical  examination at a health care facility 08/23/2013  . Essential hypertension, benign 08/23/2013  . Impaired fasting glucose 08/23/2013  . Plantar fasciitis, bilateral 08/23/2013  . Unspecified vitamin D deficiency 08/23/2013  . Obesity, unspecified 08/23/2013  . Unspecified essential hypertension 12/31/2012  . GERD (gastroesophageal reflux disease) 12/31/2012  . COMMON MIGRAINE 06/16/2008  . CHEST PAIN, INTERMITTENT 06/16/2008    Expected Discharge Date: Expected Discharge Date: 06/02/18  Team Members Present: Physician leading conference: Dr. 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 Monitor for seizures  See above   Bowel/Bladder   Continent bowel/bladder   remain continent  goal met   Swallow/Nutrition/ Hydration             ADL's   modified independent for all functional mobility, selfcare, functional transfers  modified independent to independent  selfcare retraining, balance retraining, transfer training, pt/family education, therapeutic exercise   Mobility   mod I transfers, bed mobility, ambulation distant supervision to mod I, stairs L rail mod I  mod I overall  high level balance, d/c planning    Communication  Safety/Cognition/ Behavioral Observations  mod I in room  mod I in room  met goal-patient follwoing all safety precautions   Pain   denies pain  less thna 3 out of 10   assess q shift and PRN   Skin   No breakdown or abrasions noted  no skin issues  assess q shift and as needed    Rehab Goals Patient on target to meet rehab goals: Yes *See Care Plan and progress notes for long and  short-term goals.     Barriers to Discharge  Current Status/Progress Possible Resolutions Date Resolved   Physician    Medical stability     See above  Ready for discharge today      Nursing                  PT                    OT                  SLP                SW                Discharge Planning/Teaching Needs:  Home with spouse and daughter who can provide 24/7 supervision initially.  completed   Team Discussion:  Pt has made steady progress and ready for d/c today at mod ind goals.  Revisions to Treatment Plan:  NA    Continued Need for Acute Rehabilitation Level of Care: The patient requires daily medical management by a physician with specialized training in physical medicine and rehabilitation for the following conditions: Daily direction of a multidisciplinary physical rehabilitation program to ensure safe treatment while eliciting the highest outcome that is of practical value to the patient.: Yes Daily medical management of patient stability for increased activity during participation in an intensive rehabilitation regime.: Yes Daily analysis of laboratory values and/or radiology reports with any subsequent need for medication adjustment of medical intervention for : Neurological problems   I attest that I was present, lead the team conference, and concur with the assessment and plan of the team.   Alnita Aybar 06/02/2018, 1:44 PM

## 2018-06-02 NOTE — Discharge Instructions (Signed)
Inpatient Rehab Discharge Instructions  SHUNTE SENSENEY Discharge date and time: No discharge date for patient encounter.   Activities/Precautions/ Functional Status: Activity: activity as tolerated Diet: regular Wound Care: none needed Functional status:  ___ No restrictions     ___ Walk up steps independently ___ 24/7 supervision/assistance   ___ Walk up steps with assistance ___ Intermittent supervision/assistance  ___ Bathe/dress independently ___ Walk with walker     _x__ Bathe/dress with assistance ___ Walk Independently    ___ Shower independently ___ Walk with assistance    ___ Shower with assistance ___ No alcohol     ___ Return to work/school ________    COMMUNITY REFERRALS UPON DISCHARGE:    Home Health:   PT                     Agency:  [facility]       Phone:  [phone]     Special Instructions: No driving   My questions have been answered and I understand these instructions. I will adhere to these goals and the provided educational materials after my discharge from the hospital.  Patient/Caregiver Signature _______________________________ Date __________  Clinician Signature _______________________________________ Date __________  Please bring this form and your medication list with you to all your follow-up doctor's appointments.

## 2018-06-02 NOTE — Progress Notes (Signed)
Social Work  Discharge Note  The overall goal for the admission was met for:   Discharge location: Yes - home with spouse and teen-aged daughter.  Length of Stay: Yes - 5 days  Discharge activity level: Yes - mod independent/ supervision  Home/community participation: Yes  Services provided included: MD, RD, PT, OT, SLP, RN, TR, Pharmacy and [place]: Private Insurance: [facility]  Follow-up services arranged: Home Health: PT via [place] and Patient/Family has no preference for HH/DME agencies  Comments (or additional information):  Patient/Family verbalized understanding of follow-up arrangements: Yes  Individual responsible for coordination of the follow-up plan: pt  Confirmed correct DME delivered: NA - no needs    Carly [name]

## 2018-06-10 ENCOUNTER — Other Ambulatory Visit (HOSPITAL_BASED_OUTPATIENT_CLINIC_OR_DEPARTMENT_OTHER): Payer: Self-pay | Admitting: Family Medicine

## 2018-06-10 ENCOUNTER — Ambulatory Visit (HOSPITAL_BASED_OUTPATIENT_CLINIC_OR_DEPARTMENT_OTHER)
Admission: RE | Admit: 2018-06-10 | Discharge: 2018-06-10 | Disposition: A | Discharge status: Home / Self Care | Payer: 59 | Source: Ambulatory Visit | Attending: Family Medicine | Admitting: Family Medicine

## 2018-06-10 DIAGNOSIS — R55 Syncope and collapse: Secondary | ICD-10-CM

## 2018-06-17 ENCOUNTER — Ambulatory Visit (INDEPENDENT_AMBULATORY_CARE_PROVIDER_SITE_OTHER): Payer: 59 | Admitting: Neurology

## 2018-06-17 ENCOUNTER — Encounter: Payer: Self-pay | Admitting: Neurology

## 2018-06-17 ENCOUNTER — Other Ambulatory Visit: Payer: Self-pay

## 2018-06-17 VITALS — BP 116/78 | HR 90 | Ht 62.5 in | Wt 194.0 lb

## 2018-06-17 DIAGNOSIS — G40201 Localization-related (focal) (partial) symptomatic epilepsy and epileptic syndromes with complex partial seizures, not intractable, with status epilepticus: Secondary | ICD-10-CM

## 2018-06-17 DIAGNOSIS — Z8679 Personal history of other diseases of the circulatory system: Secondary | ICD-10-CM | POA: Diagnosis not present

## 2018-06-17 MED ORDER — OXCARBAZEPINE 300 MG PO TABS: ORAL_TABLET | ORAL | 11 refills | Status: DC

## 2018-06-17 MED ORDER — LACOSAMIDE 200 MG PO TABS: 200.0000 mg | ORAL_TABLET | Freq: Two times a day (BID) | ORAL | 5 refills | Status: DC

## 2018-06-17 NOTE — Progress Notes (Signed)
NEUROLOGY CONSULTATION NOTE  Carly [name] MRN: [id number] DOB: [birth year]  Referring provider: Ferne Reus, PA-C Primary care provider: Dr. Ival Bible  Reason for consult:  seizures  Thank you for your kind referral of Carly [name] for consultation of the above symptoms. Although her history is well known to you, please allow me to reiterate it for the purpose of our medical record. The patient was accompanied to the clinic by her brother who also provides collateral information. Records and images were personally reviewed where available.  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 Hospitalization was complicated by status epilepticus. In 2018, she had a bad headache and episode of loss of consciousness with shaking at work and was found to have a subarachnoid hemorrhage secondary to ruptured aneurysm. She was discharged home on Keppra 1000mg  BID and denied any seizures or seizure-like activity until she was in the hospital for an elective procedure for enlarging aneurysm. She has no recollection of events post-op, she started having right arm and face seizure-like activity followed by a GTC. She also had an episode of right gaze deviation with vocalization, no motor activity noted. She was globally aphasic. MRI brain done 05/22/18 showed a small region of cortical T2 FLAIR hyperintensity centered within the left posterior temporal lobe with few areas of reduced diffusion. Findings probably represent seizure related activity although a component of subacute infarction is possible. No acute hemorrhage or mass effect. 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 She was intubated to achieve burst-suppression on Propofol, seizure recurrence seen with weaning Propofol, resolved with midazolam with no seizures after 05/19/18. She needed 4 AEDs, Dilantin, Keppra, Trileptal, and Vimpat. Dilantin was discontinued on hospital discharge, she was discharged on Trileptal 750mg  BID, Vimpat 200mg  BID, and Keppra 1500mg  BID.   She denies any side effects on medications. She denies any seizures since her hospitalization, her brother denies any staring/unresponsive episodes, no right-sided motor activity. She had an episode on 06/07/18 where she passed out and reported left arm numbness after. She does not recall having any focal weakness, there was no warning, she recalls it was very hot then she woke up on the ground. No tongue bite or incontinence. Head CT did not show any acute changes. She notices occasional right hand numbness when she lays on it in sleep. She denies any gaps in time, olfactory/gustatory hallucinations, deja vu, rising epigastric sensation, myoclonic jerks. She has had only one headache since coming home last month. She denies any dizziness, diplopia, dysarthria/dysphagia, neck/back pain, bowel/bladder dysfunction. She feels back to normal. She noticed some memory difficulties after the [facility], "have to think about things a little longer," but denies missing medications.   Epilepsy Risk Factors:  SAH secondary to ruptured left ICA aneurysm, s/p coiling and flow diversion. Her paternal cousin has seizures. Otherwise she had a normal birth and early development.  There is no history of febrile convulsions, CNS infections such as meningitis/encephalitis, significant traumatic brain  injury.  PAST MEDICAL HISTORY:  Past Medical History:  Diagnosis Date  . GERD (gastroesophageal reflux disease)   . Hypertension   . Memory difficulties    short term memory  . Migraines   . Seizures ([place])    with hemorrhage  . Subarachnoid hemorrhage ([facility])    d/t burst aneurysm, had seizures    PAST SURGICAL HISTORY: Past Surgical History:  Procedure Laterality Date  . CESAREAN SECTION    . ESOPHAGOGASTRODUODENOSCOPY N/A 04/01/2017   Procedure: ESOPHAGOGASTRODUODENOSCOPY (EGD);  Surgeon: Georganna Skeans, MD;  Location: [place];  Service: General;  Laterality: N/A;  . IR ANGIO INTRA EXTRACRAN SEL INTERNAL CAROTID UNI L MOD SED  05/15/2018  . IR ANGIO INTRA EXTRACRAN SEL INTERNAL CAROTID UNI R MOD SED  03/03/2017  . IR ANGIO VERTEBRAL SEL SUBCLAVIAN INNOMINATE BILAT MOD SED  02/10/2018  . IR ANGIO VERTEBRAL SEL VERTEBRAL BILAT MOD SED  03/03/2017  . IR ANGIO VERTEBRAL SEL VERTEBRAL BILAT MOD SED  02/10/2018  . IR ANGIOGRAM FOLLOW UP STUDY  03/03/2017  . IR ANGIOGRAM FOLLOW UP STUDY  03/03/2017  . IR ANGIOGRAM FOLLOW UP STUDY  03/03/2017  . IR ANGIOGRAM FOLLOW UP STUDY  03/03/2017  . IR ANGIOGRAM FOLLOW UP STUDY  05/15/2018  . IR REPLC GASTRO/COLONIC TUBE PERCUT W/FLUORO  04/15/2017  . IR TRANSCATH/EMBOLIZ  03/03/2017  . IR TRANSCATH/EMBOLIZ  05/15/2018  . IR US GUIDE VASC ACCESS RIGHT  05/15/2018  . LAPAROSCOPY     in early 90s  . PEG PLACEMENT N/A 04/01/2017   Procedure: PERCUTANEOUS ENDOSCOPIC GASTROSTOMY (PEG) PLACEMENT;  Surgeon: Georganna Skeans, MD;  Location: [place];  Service: General;  Laterality: N/A;  . RADIOLOGY WITH ANESTHESIA N/A 03/03/2017   Procedure: RADIOLOGY WITH ANESTHESIA;  Surgeon: Consuella Lose, MD;  Location: [place];  Service: Radiology;  Laterality: N/A;  . RADIOLOGY WITH ANESTHESIA N/A 05/15/2018   Procedure: Arteriogram, Embolization of recurrent aneurysm;  Surgeon: Consuella Lose, MD;  Location: [place];  Service: Radiology;  Laterality: N/A;     MEDICATIONS: Current Outpatient Medications on File Prior to Visit  Medication Sig Dispense Refill  . acetaminophen (TYLENOL) 325 MG tablet Take 2 tablets (650 mg total) by mouth every 6 (six) hours as needed for fever.    Marland Kitchen aspirin 325 MG EC tablet Take 325 mg by mouth daily.  2  . clopidogrel (PLAVIX) 75 MG tablet Take 1 tablet (75 mg total) by mouth daily. 30 tablet 2  . HYDROcodone-acetaminophen (NORCO/VICODIN) 5-325 MG tablet Take 1 tablet by mouth every 4 (four) hours as needed for moderate pain. 20 tablet 0  . lacosamide (VIMPAT) 200 MG TABS tablet Take 1 tablet (200 mg total) by mouth 2 (two) times daily. 60 tablet 2  . levETIRAcetam (KEPPRA) 750 MG tablet Take 2 tablets (1,500 mg total) by mouth 2 (two) times daily. 120 tablet 2  . Multiple Vitamin (MULTIVITAMIN WITH MINERALS) TABS tablet Take 1 tablet by mouth daily. (Patient taking differently: Take 1 tablet by mouth daily. Women's Ultra GNC Multivitamin) 30 tablet 0  . OXcarbazepine (TRILEPTAL) 150 MG tablet Take 5 tablets (750 mg total) by mouth 2 (two) times daily. 60 tablet 1  . pantoprazole (PROTONIX) 40 MG tablet Take 1 tablet (40 mg total) by mouth daily. 30 tablet 0   No current facility-administered medications on file prior to visit.     ALLERGIES: No Known Allergies  FAMILY HISTORY: Family History  Problem Relation Age of Onset  . Diabetes Mother   . Hypertension Mother   . Hyperlipidemia Mother   .  Cancer Father        colon cancer  . Diabetes Maternal Grandmother   . Hyperlipidemia Paternal Grandfather     SOCIAL HISTORY: Social History   Socioeconomic History  . Marital status: Married    Spouse name: Not on file  . Number of children: Not on file  . Years of education: Not on file  . Highest education level: Not on file  Occupational History  . Not on file  Social Needs  . Financial resource strain: Not on file  . Food insecurity:    Worry: Not on file    Inability: Not on file  .  Transportation needs:    Medical: Not on file    Non-medical: Not on file  Tobacco Use  . Smoking status: Never Smoker  . Smokeless tobacco: Never Used  Substance and Sexual Activity  . Alcohol use: Not Currently    Comment: occ  . Drug use: No  . Sexual activity: Yes    Birth control/protection: IUD  Lifestyle  . Physical activity:    Days per week: Not on file    Minutes per session: Not on file  . Stress: Not on file  Relationships  . Social connections:    Talks on phone: Not on file    Gets together: Not on file    Attends religious service: Not on file    Active member of club or organization: Not on file    Attends meetings of clubs or organizations: Not on file    Relationship status: Not on file  . Intimate partner violence:    Fear of current or ex partner: Not on file    Emotionally abused: Not on file    Physically abused: Not on file    Forced sexual activity: Not on file  Other Topics Concern  . Not on file  Social History Narrative   Marital Status: Married [name])   Children:  Son (1) Daughter (1)    Pets: None   Living Situation: Lives with husband and children.   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[employer])   Education: [employer]    Alcohol Use:  Occasional   Drug Use:  None   Diet:  Regular   Exercise:  Walking    Hobbies: Dance   [Tobacco: Never smoker]          REVIEW OF SYSTEMS: Constitutional: No fevers, chills, or sweats, no generalized fatigue, change in appetite Eyes: No visual changes, double vision, eye pain Ear, nose and throat: No hearing loss, ear pain, nasal congestion, sore throat Cardiovascular: No chest pain, palpitations Respiratory:  No shortness of breath at rest or with exertion, wheezes GastrointestinaI: No nausea, vomiting, diarrhea, abdominal pain, fecal incontinence Genitourinary:  No dysuria, urinary retention or frequency Musculoskeletal:  No neck pain, back pain Integumentary: No rash, pruritus, skin  lesions Neurological: as above Psychiatric: No depression, insomnia, anxiety Endocrine: No palpitations, fatigue, diaphoresis, mood swings, change in appetite, change in weight, increased thirst Hematologic/Lymphatic:  No anemia, purpura, petechiae. Allergic/Immunologic: no itchy/runny eyes, nasal congestion, recent allergic reactions, rashes  PHYSICAL EXAM: Vitals:   06/17/18 0915  BP: 116/78  Pulse: 90  SpO2: 99%   General: No acute distress Head:  Normocephalic/atraumatic Eyes: Fundoscopic exam shows bilateral sharp discs, no vessel changes, exudates, or hemorrhages Neck: supple, no paraspinal tenderness, full range of motion Back: No paraspinal tenderness Heart: regular rate and rhythm Lungs: Clear to auscultation bilaterally. Vascular: No carotid bruits. Skin/Extremities: No rash, no edema  Neurological Exam: Mental status: alert and oriented to person, place, and time, no dysarthria or aphasia, Fund of knowledge is appropriate.  Recent and remote memory are intact. 3/3 delayed recall.  Attention and concentration are normal.    Able to name objects and repeat phrases. Cranial nerves: CN I: not tested CN II: pupils equal, round and reactive to light, visual fields intact, fundi unremarkable. CN III, IV, VI:  full range of motion, no nystagmus, no ptosis CN V: facial sensation intact CN VII: upper and lower face symmetric CN VIII: hearing intact to finger rub CN IX, X: gag intact, uvula midline CN XI: sternocleidomastoid and trapezius muscles intact CN XII: tongue midline Bulk & Tone: normal, no fasciculations. Motor: 5/5 throughout with no pronator drift. Sensation: intact to light touch, cold, pin, vibration and joint position sense.  No extinction to double simultaneous stimulation.  Romberg test negative Deep Tendon Reflexes: +2 throughout, no ankle clonus Plantar responses: downgoing bilaterally Cerebellar: no incoordination on finger to nose, heel to shin. No  dysdiadochokinesia Gait: narrow-based and steady, able to tandem walk adequately. Tremor: none  IMPRESSION: This is a very pleasant 46 year old right-handed woman with a history of subarachnoid hemorrhage s/p coil embolization of supraclinoid left ICA aneurysm in June 2018 with recurrence, who underwent elective aneurysmal treatment with flow diversion last 05/15/18. Hospitalization was complicated by status epilepticus arising from the left hemisphere. MRI brain showed a small region of cortical T2 FLAIR hyperintensity centered within the left posterior temporal lobe with few areas of reduced diffusion, findings probably represent seizure related activity although a component of subacute infarction is possible. She is interested in further weaning off medications. She and her brother deny any further similar seizures as seen in the hospital, but she had a syncopal episode last 06/07/18. Follow-up MRI brain with and without contrast will be ordered, as well as a 1-hour EEG. She will reduce oxcarbazepine slightly to 600mg  BID, we may further wean down if studies are stable. Continue Levetiracetam 1500mg  BID and Vimpat 200mg  BID for now. [place] driving laws were discussed with the patient, and she knows to stop driving after a seizure, until 6 months seizure-free. She will follow-up in 4 months and knows to call for any changes.   Thank you for allowing me to participate in the care of this patient. Please do not hesitate to call for any questions or concerns.   Ellouise Newer, M.D.  CC: Ferne Reus, PA-C, Dr. Dion Saucier

## 2018-06-17 NOTE — Patient Instructions (Addendum)
1. Schedule MRI brain with and without contrast  We have sent a referral to [facility] for your MRI and they will call you directly to schedule your appt. They are located at [place]. If you need to contact them directly please call [phone].   2. Schedule 1-hour EEG 3. Change oxcarbazepine (Trileptal) 300mg : take 2 tablets twice a day 4. Continue Keppra 750mg : take 2 tablets twice a day and Vimpat 200mg : take 1 tablet twice a day 5. Follow-up in 3-4 months, call for any changes  Seizure Precautions: 1. If medication has been prescribed for you to prevent seizures, take it exactly as directed.  Do not stop taking the medicine without talking to your doctor first, even if you have not had a seizure in a long time.   2. Avoid activities in which a seizure would cause danger to yourself or to others.  Don't operate dangerous machinery, swim alone, or climb in high or dangerous places, such as on ladders, roofs, or girders.  Do not drive unless your doctor says you may.  3. If you have any warning that you may have a seizure, lay down in a safe place where you can't hurt yourself.    4.  No driving for 6 months from last seizure, as per [facility].   Please refer to the following link on the [place] website for more information: http://[email or web address]/[email or web address]/[email or web address]   5.  Maintain good sleep hygiene. Avoid alcohol.  6.  Contact your doctor if you have any problems that may be related to the medicine you are taking.  7.  Call 911 and bring the patient back to the ED if:        A.  The seizure lasts longer than 5 minutes.       B.  The patient doesn't awaken shortly after the seizure  C.  The patient has new problems such as difficulty seeing, speaking or moving  D.  The patient was injured during the seizure  E.  The patient has a temperature over 102 F (39C)  F.  The patient vomited and now is  having trouble breathing

## 2018-06-29 ENCOUNTER — Ambulatory Visit (INDEPENDENT_AMBULATORY_CARE_PROVIDER_SITE_OTHER): Payer: 59 | Admitting: Neurology

## 2018-06-29 DIAGNOSIS — G40201 Localization-related (focal) (partial) symptomatic epilepsy and epileptic syndromes with complex partial seizures, not intractable, with status epilepticus: Secondary | ICD-10-CM | POA: Diagnosis not present

## 2018-06-29 DIAGNOSIS — Z8679 Personal history of other diseases of the circulatory system: Secondary | ICD-10-CM | POA: Diagnosis not present

## 2018-07-06 NOTE — Procedures (Signed)
ELECTROENCEPHALOGRAM REPORT  Date of Study: 06/29/2018  Patient's Name: Carly [name] MRN: [id number] Date of Birth: [birth year]  Referring Provider: Dr. Ellouise Newer  Clinical History: This is a 47 year old woman with history of SAH due to rupture aneurysm with status epilepticus in August 2019. She is on 3 seizure medications and interested in medication reduction.  Medications: VIMPAT 200 MG TABS tablet  KEPPRA 750 MG tablet  TRILEPTAL 150 MG tablet  TYLENOL 325 MG tablet    aspirin 325 MG EC tablet  PLAVIX 75 MG tablet  NORCO/VICODIN 5-325 MG tablet  MULTIVITAMIN WITH MINERALS TABS tablet  PROTONIX 40 MG tablet   Technical Summary: A multichannel digital 1-hour EEG recording measured by the international 10-20 system with electrodes applied with paste and impedances below 5000 ohms performed in our laboratory with EKG monitoring in an awake and asleep patient.  Hyperventilation and photic stimulation were not performed.  The digital EEG was referentially recorded, reformatted, and digitally filtered in a variety of bipolar and referential montages for optimal display.    Description: The patient is awake and asleep during the recording.  During maximal wakefulness, there is a symmetric, medium voltage 10 Hz posterior dominant rhythm that attenuates with eye opening.  There is frequent focal theta and delta slowing over the left temporal region. During drowsiness and sleep, there is an increase in theta slowing of the background.  Vertex waves and symmetric sleep spindles were seen.  Hyperventilation and photic stimulation were not performed. There were no epileptiform discharges or electrographic seizures seen.    EKG lead was unremarkable.  Impression: This 1-hour awake and asleep EEG is abnormal due to frequent focal slowing over the left temporal region.  Clinical Correlation of the above findings indicates focal cerebral dysfunction over the left temporal  region suggestive  of underlying structural or physiologic abnormality. The absence of epileptiform discharges does not exclude a clinical diagnosis of epilepsy. Clinical correlation is advised.    Ellouise Newer, M.D.

## 2018-07-07 ENCOUNTER — Inpatient Hospital Stay: Admission: RE | Admit: 2018-07-07 | Payer: 59 | Source: Ambulatory Visit

## 2018-07-08 ENCOUNTER — Encounter: Payer: 59 | Attending: Physical Medicine & Rehabilitation | Admitting: Physical Medicine & Rehabilitation

## 2018-07-08 ENCOUNTER — Encounter: Payer: Self-pay | Admitting: Physical Medicine & Rehabilitation

## 2018-07-08 VITALS — BP 126/77 | HR 79 | Resp 14 | Ht 63.0 in | Wt 196.0 lb

## 2018-07-08 DIAGNOSIS — I10 Essential (primary) hypertension: Secondary | ICD-10-CM | POA: Insufficient documentation

## 2018-07-08 DIAGNOSIS — I720 Aneurysm of carotid artery: Secondary | ICD-10-CM

## 2018-07-08 DIAGNOSIS — Z833 Family history of diabetes mellitus: Secondary | ICD-10-CM | POA: Insufficient documentation

## 2018-07-08 DIAGNOSIS — I69319 Unspecified symptoms and signs involving cognitive functions following cerebral infarction: Secondary | ICD-10-CM

## 2018-07-08 DIAGNOSIS — Z8249 Family history of ischemic heart disease and other diseases of the circulatory system: Secondary | ICD-10-CM | POA: Insufficient documentation

## 2018-07-08 DIAGNOSIS — I609 Nontraumatic subarachnoid hemorrhage, unspecified: Secondary | ICD-10-CM

## 2018-07-08 DIAGNOSIS — G40909 Epilepsy, unspecified, not intractable, without status epilepticus: Secondary | ICD-10-CM | POA: Diagnosis not present

## 2018-07-08 NOTE — Progress Notes (Signed)
Subjective:    Patient ID: Carly [name], female    DOB: 29-Mar-1971, 47 y.o.   MRN: [id number]  HPI 47 year old right-handed female with history of subarachnoid hemorrhage with coil embolization 02/2017 presents for hospital follow up after receiving CIR for seizures after SAH.  DATE OF ADMISSION: 05/28/2008 DATE OF DISCHARGE: 06/02/2018   At discharge, he was instructed to follow up with Neurosurg, who she sees tomorrow.  She saw her PCP after discharge.  She is not driving. BP is controlled.  She is not driving. 1 fall when she states she passed out after being in the sun.  Therapies: Released  DME: Previously possessed  Mobility: No assistive device  Pain Inventory Average Pain 8 Pain Right Now 6 My pain is intermittent and throbbing  In the last 24 hours, has pain interfered with the following? General activity 3 Relation with others 3 Enjoyment of life 1 What TIME of day is your pain at its worst? varies Sleep (in general) Poor  Pain is worse with: unsure Pain improves with: medication and TENS Relief from Meds: 9  Mobility walk without assistance do you drive?  no  Function not employed: date last employed . disabled: date disabled .  Neuro/Psych No problems in this area  Prior Studies hospital f/u  Physicians involved in your care hospital f/u   Family History  Problem Relation Age of Onset  . Diabetes Mother   . Hypertension Mother   . Hyperlipidemia Mother   . Cancer Father        colon cancer  . Diabetes Maternal Grandmother   . Hyperlipidemia Paternal Grandfather    Social History   Socioeconomic History  . Marital status: Married    Spouse name: Not on file  . Number of children: Not on file  . Years of education: Not on file  . Highest education level: Not on file  Occupational History  . Not on file  Social Needs  . Financial resource strain: Not on file  . Food insecurity:    Worry: Not on file    Inability: Not on file  .  Transportation needs:    Medical: Not on file    Non-medical: Not on file  Tobacco Use  . Smoking status: Never Smoker  . Smokeless tobacco: Never Used  Substance and Sexual Activity  . Alcohol use: Not Currently    Comment: occ  . Drug use: No  . Sexual activity: Yes    Birth control/protection: IUD  Lifestyle  . Physical activity:    Days per week: Not on file    Minutes per session: Not on file  . Stress: Not on file  Relationships  . Social connections:    Talks on phone: Not on file    Gets together: Not on file    Attends religious service: Not on file    Active member of club or organization: Not on file    Attends meetings of clubs or organizations: Not on file    Relationship status: Not on file  Other Topics Concern  . Not on file  Social History Narrative   Marital Status: Married [name])   Children:  Son (1) Daughter (1)    Pets: None   Living Situation: Lives with husband and children.   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 CESAREAN SECTION    . ESOPHAGOGASTRODUODENOSCOPY N/A 04/01/2017   Procedure: ESOPHAGOGASTRODUODENOSCOPY (EGD);  Surgeon: Georganna Skeans, MD;  Location: [facility];  Service: General;  Laterality: N/A;  . IR ANGIO INTRA EXTRACRAN SEL INTERNAL CAROTID UNI L MOD SED  05/15/2018  . IR ANGIO INTRA EXTRACRAN SEL INTERNAL CAROTID UNI R MOD SED  03/03/2017  . IR ANGIO VERTEBRAL SEL SUBCLAVIAN INNOMINATE BILAT MOD SED  02/10/2018  . IR ANGIO VERTEBRAL SEL VERTEBRAL BILAT MOD SED  03/03/2017  . IR ANGIO VERTEBRAL SEL VERTEBRAL BILAT MOD SED  02/10/2018  . IR ANGIOGRAM FOLLOW UP STUDY  03/03/2017  . IR ANGIOGRAM FOLLOW UP STUDY  03/03/2017  . IR ANGIOGRAM FOLLOW UP STUDY  03/03/2017  . IR ANGIOGRAM FOLLOW UP STUDY  03/03/2017  . IR  ANGIOGRAM FOLLOW UP STUDY  05/15/2018  . IR REPLC GASTRO/COLONIC TUBE PERCUT W/FLUORO  04/15/2017  . IR TRANSCATH/EMBOLIZ  03/03/2017  . IR TRANSCATH/EMBOLIZ  05/15/2018  . IR US GUIDE VASC ACCESS RIGHT  05/15/2018  . LAPAROSCOPY     in early 90s  . PEG PLACEMENT N/A 04/01/2017   Procedure: PERCUTANEOUS ENDOSCOPIC GASTROSTOMY (PEG) PLACEMENT;  Surgeon: Georganna Skeans, MD;  Location: [place];  Service: General;  Laterality: N/A;  . RADIOLOGY WITH ANESTHESIA N/A 03/03/2017   Procedure: RADIOLOGY WITH ANESTHESIA;  Surgeon: Consuella Lose, MD;  Location: [facility];  Service: Radiology;  Laterality: N/A;  . RADIOLOGY WITH ANESTHESIA N/A 05/15/2018   Procedure: Arteriogram, Embolization of recurrent aneurysm;  Surgeon: Consuella Lose, MD;  Location: [facility];  Service: Radiology;  Laterality: N/A;   Past Medical History:  Diagnosis Date  . GERD (gastroesophageal reflux disease)   . Hypertension   . Memory difficulties    short term memory  . Migraines   . Seizures ([place])    with hemorrhage  . Subarachnoid hemorrhage (HCC)    d/t burst aneurysm, had seizures   BP 126/77   Pulse 79   Resp 14   Ht 5\' 3"  (1.6 m)   Wt 196 lb (88.9 kg)   SpO2 95%   BMI 34.72 kg/m   Opioid Risk Score:   Fall Risk Score:  `1  Depression screen PHQ 2/9  Depression screen PHQ 2/9 04/30/2017  Decreased Interest 1  Down, Depressed, Hopeless 1  PHQ - 2 Score 2  Altered sleeping 2  Tired, decreased energy 2  Change in appetite 3  Feeling bad or failure about yourself  1  Trouble concentrating 3  Moving slowly or fidgety/restless 3  Suicidal thoughts 0  PHQ-9 Score 16  Difficult doing work/chores Somewhat difficult    Review of Systems  Constitutional: Negative.   HENT: Negative.   Eyes: Positive for photophobia and visual disturbance.  Respiratory: Negative.   Cardiovascular: Negative.   Gastrointestinal: Negative.   Endocrine: Negative.   Genitourinary: Negative.   Musculoskeletal: Negative.    Skin: Negative.   Allergic/Immunologic: Negative.   Neurological: Positive for dizziness, seizures, light-headedness and headaches.  Hematological: Negative.   Psychiatric/Behavioral: Negative.   All other systems reviewed and are negative.     Objective:   Physical Exam Gen.: NAD. Obese. HENT: Normocephalic. Atraumatic. Eyes: EOM are normal. Novdischarge.  Cardiovascular: RRR. No JVD. Respiratory: Effort normal and breath sounds normal.  GI: Bowel sounds are normal. She exhibits no distension.  Musculoskeletal: lower extremity edema, improved Neurological: She  is alert.  Follows simple commands.  Motor:  4/5 b/l UE 5/5 b/l LE Skin: Warm and dry. Intact. Psych: Normal mood and affect.    Assessment & Plan:  47 year old right-handed female with history of subarachnoid hemorrhage with coil embolization 02/2017 presents for hospital follow up after receiving CIR for seizures after SAH.  1.  Decreased functional mobility with seizures secondary to residual aneurysm from history of subarachnoid hemorrhage status post embolization 05/15/2018  Cont HEP  Follow up with Neurosurg  No driving until cleared by physician  2.  Seizure disorder.    Cont meds  Follow up with Neurology

## 2018-09-17 ENCOUNTER — Ambulatory Visit (INDEPENDENT_AMBULATORY_CARE_PROVIDER_SITE_OTHER): Payer: 59 | Admitting: Neurology

## 2018-09-17 ENCOUNTER — Encounter: Payer: Self-pay | Admitting: Neurology

## 2018-09-17 ENCOUNTER — Other Ambulatory Visit: Payer: Self-pay

## 2018-09-17 VITALS — BP 132/68 | HR 75 | Ht 63.0 in | Wt 196.0 lb

## 2018-09-17 DIAGNOSIS — G40201 Localization-related (focal) (partial) symptomatic epilepsy and epileptic syndromes with complex partial seizures, not intractable, with status epilepticus: Secondary | ICD-10-CM | POA: Diagnosis not present

## 2018-09-17 DIAGNOSIS — Z8679 Personal history of other diseases of the circulatory system: Secondary | ICD-10-CM | POA: Diagnosis not present

## 2018-09-17 MED ORDER — LEVETIRACETAM 750 MG PO TABS: 1500.0000 mg | ORAL_TABLET | Freq: Two times a day (BID) | ORAL | 11 refills | Status: DC

## 2018-09-17 MED ORDER — LACOSAMIDE 200 MG PO TABS: 200.0000 mg | ORAL_TABLET | Freq: Two times a day (BID) | ORAL | 5 refills | Status: DC

## 2018-09-17 NOTE — Patient Instructions (Signed)
1. Start weaning off the oxcarbazepine 300mg : Take 1.5 tablets twice a day for a week, then reduce to 1 tablet twice a day for a week, then reduce to 1/2 tablet twice a day for a week, then reduce to 1/2 tablet every night a week, then stop  2. Continue Vimpat 200mg  twice a day and Levetiracetam (Keppra) 750mg : take 2 tablets twice a day  3. Follow-up in 3-4 months, call for any changes  Seizure Precautions: 1. If medication has been prescribed for you to prevent seizures, take it exactly as directed.  Do not stop taking the medicine without talking to your doctor first, even if you have not had a seizure in a long time.   2. Avoid activities in which a seizure would cause danger to yourself or to others.  Don't operate dangerous machinery, swim alone, or climb in high or dangerous places, such as on ladders, roofs, or girders.  Do not drive unless your doctor says you may.  3. If you have any warning that you may have a seizure, lay down in a safe place where you can't hurt yourself.    4.  No driving for 6 months from last seizure, as per [facility].   Please refer to the following link on the [place] website for more information: http://[email or web address]/[email or web address]/[email or web address]   5.  Maintain good sleep hygiene. Avoid alcohol.  6.  Contact your doctor if you have any problems that may be related to the medicine you are taking.  7.  Call 911 and bring the patient back to the ED if:        A.  The seizure lasts longer than 5 minutes.       B.  The patient doesn't awaken shortly after the seizure  C.  The patient has new problems such as difficulty seeing, speaking or moving  D.  The patient was injured during the seizure  E.  The patient has a temperature over 102 F (39C)  F.  The patient vomited and now is having trouble breathing

## 2018-09-17 NOTE — Progress Notes (Signed)
NEUROLOGY FOLLOW UP OFFICE NOTE  Carly [name] [id number] 1970-10-29  HISTORY OF PRESENT ILLNESS: I had the pleasure of seeing Carly [name] in follow-up in the neurology clinic on 09/17/2018. She is again accompanied by her son who helps supplement the history today. 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 She was in status epilepticus in August 2019 arising from the left posterior temporal region/left hemisphere, requiring 4 AEDs to control seizures. Repeat EEG done 06/2018 showed frequent left temporal focal slowing, no epileptiform discharges. She is currently taking Trileptal 600mg  BID, Vimpat 200mg  BID, and Keppra 1500mg  BID. She and her son deny any seizures since hospital discharge, no episodes of right gaze deviation, aphasia, staring/unresponsiveness, gaps in time, olfactory/gustatory hallucinations, focal numbness/tingling/weakness, myoclonic jerks. She denies any dizziness, diplopia, or headaches except around the time of her menses. She continues to note word-finding difficulties, memory is okay. She had vision issues after her hospital stay which required eye surgery, vision is improved. Mood is up and down. She has noticed that she gets drowsy after taking the evening dose of Vimpat and oxcarbazepine, needing an alarm to take Keppra at bedtime. She does not feel drowsy when she takes her morning dose of the 3 medications at the same time.   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 Hospitalization was complicated by status epilepticus. In 2018, she had a bad headache and episode of loss of consciousness with shaking at work and was found to have a subarachnoid hemorrhage  secondary to ruptured aneurysm. She was discharged home on Keppra 1000mg  BID and denied any seizures or seizure-like activity until she was in the hospital for an elective procedure for enlarging aneurysm. She has no recollection of events post-op, she started having right arm and face seizure-like activity followed by a GTC. She also had an episode of right gaze deviation with vocalization, no motor activity noted. She was globally aphasic. MRI brain done 05/22/18 showed a small region of cortical T2 FLAIR hyperintensity centered within the left posterior temporal lobe with few areas of reduced diffusion. Findings probably represent seizure related activity although a component of subacute infarction is possible. No acute hemorrhage or mass effect. 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 She was intubated to achieve burst-suppression on Propofol, seizure recurrence seen with weaning Propofol, resolved with midazolam with no seizures after 05/19/18. She needed 4 AEDs, Dilantin, Keppra, Trileptal, and Vimpat. Dilantin was discontinued on hospital discharge, she was discharged on Trileptal 750mg  BID, Vimpat 200mg  BID, and Keppra 1500mg  BID.   She denies any side effects on medications. She denies any seizures since her hospitalization, her brother denies any staring/unresponsive episodes, no right-sided motor activity. She had an episode on 06/07/18 where she passed out and reported left arm numbness after. She does not recall having any focal weakness, there was no warning, she recalls it was very hot then she woke up on the ground. No tongue  bite  or incontinence. Head CT did not show any acute changes. She notices occasional right hand numbness when she lays on it in sleep. She denies any gaps in time, olfactory/gustatory hallucinations, deja vu, rising epigastric sensation, myoclonic jerks. She has had only one headache since coming home last month. She denies any dizziness, diplopia, dysarthria/dysphagia, neck/back pain, bowel/bladder dysfunction. She feels back to normal. She noticed some memory difficulties after the [facility], "have to think about things a little longer," but denies missing medications.   Epilepsy Risk Factors:  SAH secondary to ruptured left ICA aneurysm, s/p coiling and flow diversion. Her paternal cousin has seizures. Otherwise she had a normal birth and early development.  There is no history of febrile convulsions, CNS infections such as meningitis/encephalitis, significant traumatic brain injury.  PAST MEDICAL HISTORY: Past Medical History:  Diagnosis Date  . GERD (gastroesophageal reflux disease)   . Hypertension   . Memory difficulties    short term memory  . Migraines   . Seizures ([place])    with hemorrhage  . Subarachnoid hemorrhage (HCC)    d/t burst aneurysm, had seizures    MEDICATIONS: Current Outpatient Medications on File Prior to Visit  Medication Sig Dispense Refill  . acetaminophen (TYLENOL) 325 MG tablet Take 2 tablets (650 mg total) by mouth every 6 (six) hours as needed for fever.    Marland Kitchen aspirin 325 MG EC tablet Take 325 mg by mouth daily.  2  . clopidogrel (PLAVIX) 75 MG tablet Take 1 tablet (75 mg total) by mouth daily. 30 tablet 2  . HYDROcodone-acetaminophen (NORCO/VICODIN) 5-325 MG tablet Take 1 tablet by mouth every 4 (four) hours as needed for moderate pain. 20 tablet 0  . lacosamide (VIMPAT) 200 MG TABS tablet Take 1 tablet (200 mg total) by mouth 2 (two) times daily. 60 tablet 5  . levETIRAcetam (KEPPRA) 750 MG tablet Take 2 tablets (1,500 mg total) by mouth 2 (two) times daily. 120  tablet 2  . Multiple Vitamin (MULTIVITAMIN WITH MINERALS) TABS tablet Take 1 tablet by mouth daily. (Patient taking differently: Take 1 tablet by mouth daily. Women's Ultra GNC Multivitamin) 30 tablet 0  . Oxcarbazepine (TRILEPTAL) 300 MG tablet Take 2 tablets twice a day 120 tablet 11  . pantoprazole (PROTONIX) 40 MG tablet Take 1 tablet (40 mg total) by mouth daily. 30 tablet 0   No current facility-administered medications on file prior to visit.     ALLERGIES: No Known Allergies  FAMILY HISTORY: Family History  Problem Relation Age of Onset  . Diabetes Mother   . Hypertension Mother   . Hyperlipidemia Mother   . Cancer Father        colon cancer  . Diabetes Maternal Grandmother   . Hyperlipidemia Paternal Grandfather     SOCIAL HISTORY: Social History   Socioeconomic History  . Marital status: Married    Spouse name: Not on file  . Number of children: Not on file  . Years of education: Not on file  . Highest education level: Not on file  Occupational History  . Not on file  Social Needs  . Financial resource strain: Not on file  . Food insecurity:    Worry: Not on file    Inability: Not on file  . Transportation needs:    Medical: Not on file    Non-medical: Not on file  Tobacco Use  . Smoking status: Never Smoker  . Smokeless tobacco: Never Used  Substance and Sexual Activity  . Alcohol use: Not  Currently    Comment: occ  . Drug use: No  . Sexual activity: Yes    Birth control/protection: I.U.D.  Lifestyle  . Physical activity:    Days per week: Not on file    Minutes per session: Not on file  . Stress: Not on file  Relationships  . Social connections:    Talks on phone: Not on file    Gets together: Not on file    Attends religious service: Not on file    Active member of club or organization: Not on file    Attends meetings of clubs or organizations: Not on file    Relationship status: Not on file  . Intimate partner violence:    Fear of current  or ex partner: Not on file    Emotionally abused: Not on file    Physically abused: Not on file    Forced sexual activity: Not on file  Other Topics Concern  . Not on file  Social History Narrative   Marital Status: Married [name])   Children:  Son (1) Daughter (1)    Pets: None   Living Situation: Lives with husband and children.   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[employer])   Education: [employer]    Alcohol Use:  Occasional   Drug Use:  None   Diet:  Regular   Exercise:  Walking    Hobbies: Dance   [Tobacco: Never smoker]          REVIEW OF SYSTEMS: Constitutional: No fevers, chills, or sweats, no generalized fatigue, change in appetite Eyes: No visual changes, double vision, eye pain Ear, nose and throat: No hearing loss, ear pain, nasal congestion, sore throat Cardiovascular: No chest pain, palpitations Respiratory:  No shortness of breath at rest or with exertion, wheezes GastrointestinaI: No nausea, vomiting, diarrhea, abdominal pain, fecal incontinence Genitourinary:  No dysuria, urinary retention or frequency Musculoskeletal:  No neck pain, back pain Integumentary: No rash, pruritus, skin lesions Neurological: as above Psychiatric: No depression, insomnia, anxiety Endocrine: No palpitations, fatigue, diaphoresis, mood swings, change in appetite, change in weight, increased thirst Hematologic/Lymphatic:  No anemia, purpura, petechiae. Allergic/Immunologic: no itchy/runny eyes, nasal congestion, recent allergic reactions, rashes  PHYSICAL EXAM: Vitals:   09/17/18 0921  BP: 132/68  Pulse: 75  SpO2: 99%   General: No acute distress Head:  Normocephalic/atraumatic Neck: supple, no paraspinal tenderness, full range of motion Heart:  Regular rate and rhythm Lungs:  Clear to auscultation bilaterally Back: No paraspinal tenderness Skin/Extremities: No rash, no edema Neurological Exam: alert and oriented to person, place, and time. No aphasia or dysarthria. Fund of  knowledge is appropriate.  Recent and remote memory are intact. 3/3 delayed recall. Attention and concentration are normal. 5/5 WORLD backward.  Able to name objects and repeat phrases. Cranial nerves: Pupils equal, round, reactive to light. Extraocular movements intact with no nystagmus. Visual fields full. Facial sensation intact. No facial asymmetry. Tongue, uvula, palate midline.  Motor: Bulk and tone normal, muscle strength 5/5 throughout with no pronator drift.  Sensation to light touch intact.  No extinction to double simultaneous stimulation.  Deep tendon reflexes +2 throughout.  Finger to nose testing intact.  Gait narrow-based and steady, able to tandem walk adequately.  Romberg negative.  IMPRESSION: This is a very pleasant 48 yo RH woman with a history of subarachnoid hemorrhage s/p coil embolization of supraclinoid left ICA aneurysm in June 2018 with recurrence, who underwent elective aneurysmal treatment with flow diversion last 05/15/18. Hospitalization was  complicated by status epilepticus arising from the left hemisphere. MRI brain showed a small region of cortical T2 FLAIR hyperintensity centered within the left posterior temporal lobe with few areas of reduced diffusion, findings probably represent seizure related activity although a component of subacute infarction is possible. She has been seizure-free since August 2019 and expressed interest in further weaning off medication. Repeat EEG showed left temporal slowing. We discussed weaning off oxcarbazepine, instructions were given today. Continue Levetiracetam 1500mg  BID and Vimpat 200mg  BID. We discussed risks for breakthrough seizure with any medication adjustment, she knows to call our office for any issues. She is aware of [place] driving laws to stop driving after a seizure, until 6 months seizure-free. She will follow-up in 3-4 months and knows to call for any changes.  Thank you for allowing me to participate in her care.  Please do not  hesitate to call for any questions or concerns.  The duration of this appointment visit was 26 minutes of face-to-face time with the patient.  Greater than 50% of this time was spent in counseling, explanation of diagnosis, planning of further management, and coordination of care.   Ellouise Newer, M.D.   CC: Dr. Dion Saucier

## 2018-10-13 ENCOUNTER — Telehealth: Payer: Self-pay | Admitting: *Deleted

## 2018-10-13 ENCOUNTER — Telehealth: Payer: Self-pay | Admitting: Neurology

## 2018-10-13 NOTE — Telephone Encounter (Signed)
Patient needs to talk to someone about her medication and if Dr Delice Lesch wants her to cont the Plavix and the aspirin

## 2018-10-13 NOTE — Telephone Encounter (Signed)
Patient left a message  Asking if someone would call her about her medications.  I contacted the patient.  She is wondering about her aspirin 81 mg and Plavix.  Does she need to continue taking? Was this prophylactic? She says she was told at last visit with Dr. Posey Pronto that no follow ups were necessary.  She says Dr. Ralene Ok has essentially said the same.  I advised her to go to her Primary.  Any advise would be helpful

## 2018-10-13 NOTE — Telephone Encounter (Signed)
Spoke with pt.  I advised her to speak with the PA-C who prescribed her Plavix. Pt states that she has been taking Plavix in addition to 325mg  ASA everyday since being discharged from hospital.  I let her know that usually that treatment is only for short while, then either Plavix or ASA dropped.  Number given for Lauraine Rinne, PA-C.

## 2018-10-14 NOTE — Telephone Encounter (Signed)
It has been several months since I have seen her.  If her PCP does not feel comfortable making these decisions, she was also told to follow up with Neurology, who may be able to address these questions as well.  Thanks.

## 2018-10-16 ENCOUNTER — Telehealth: Payer: Self-pay | Admitting: Neurology

## 2018-10-16 NOTE — Telephone Encounter (Signed)
Reviewed records and spoke to patient. It is unclear exactly when she was started on combination aspirin 325mg  and Plavix 75mg  daily, but she had not been taking it prior to August 2019. Patient states that when the enlarging aneurysm was seen, she was instructed by Dr. Kathyrn Sheriff to take aspirin and Plavix for 7 days, then to stop it for the procedure. It then appears that patient was restarted on it afterward. She clarifies again that she was not taking any antiplatelet medication in the past for secondary stroke prevention. We discussed stopping the Plavix and 325mg  aspirin, and start taking a daily aspirin 81mg  for now. We will discuss on her f/u in May if we need to continue this low dose aspirin in the future. Patient otherwise reports doing well, asymptomatic.

## 2018-10-16 NOTE — Telephone Encounter (Signed)
-----   Message from Lurene Shadow sent at 10/15/2018  4:08 PM EST ----- Regarding: PCP call Good afternoon,  This patient's PCP (Dr. Elza Rafter?) left a VM about a medication combination for her. She is wanting you to call the patient and let her know if she is supposed to be taking the 325mg  Aspirin and the 75mg  Plavix. The patient is wanting to know why she is taking these meds if she is in fact to be taking them. Please call the patient back at [phone].   Thanks!  Chelsea

## 2018-11-24 DIAGNOSIS — E785 Hyperlipidemia, unspecified: Secondary | ICD-10-CM

## 2018-11-24 DIAGNOSIS — Z23 Encounter for immunization: Secondary | ICD-10-CM

## 2018-11-24 DIAGNOSIS — Z0000 Encounter for general adult medical examination without abnormal findings: Secondary | ICD-10-CM

## 2018-11-24 DIAGNOSIS — Z124 Encounter for screening for malignant neoplasm of cervix: Secondary | ICD-10-CM | POA: Insufficient documentation

## 2018-11-24 HISTORY — DX: Encounter for immunization: Z23

## 2018-11-24 HISTORY — DX: Hyperlipidemia, unspecified: E78.5

## 2018-12-18 ENCOUNTER — Other Ambulatory Visit: Payer: Self-pay | Admitting: Neurology

## 2019-01-15 ENCOUNTER — Other Ambulatory Visit: Payer: Self-pay

## 2019-01-18 ENCOUNTER — Telehealth (INDEPENDENT_AMBULATORY_CARE_PROVIDER_SITE_OTHER): Payer: 59 | Admitting: Neurology

## 2019-01-18 ENCOUNTER — Other Ambulatory Visit: Payer: Self-pay

## 2019-01-18 DIAGNOSIS — G40201 Localization-related (focal) (partial) symptomatic epilepsy and epileptic syndromes with complex partial seizures, not intractable, with status epilepticus: Secondary | ICD-10-CM

## 2019-01-18 DIAGNOSIS — Z8679 Personal history of other diseases of the circulatory system: Secondary | ICD-10-CM

## 2019-01-18 MED ORDER — LEVETIRACETAM 750 MG PO TABS: 1500.0000 mg | ORAL_TABLET | Freq: Two times a day (BID) | ORAL | 11 refills | Status: DC

## 2019-01-18 MED ORDER — LACOSAMIDE 200 MG PO TABS: 200.0000 mg | ORAL_TABLET | Freq: Two times a day (BID) | ORAL | 5 refills | Status: DC

## 2019-01-18 NOTE — Progress Notes (Signed)
Virtual Visit via Video Note The purpose of this virtual visit is to provide medical care while limiting exposure to the novel coronavirus.    Consent was obtained for video visit:  Yes.   Answered questions that patient had about telehealth interaction:  Yes.   I discussed the limitations, risks, security and privacy concerns of performing an evaluation and management service by telemedicine. I also discussed with the patient that there may be a patient responsible charge related to this service. The patient expressed understanding and agreed to proceed.  Pt location: Home Physician Location: office Name of referring provider:  [name], MD I connected with Carly [name] at patients initiation/request on 01/18/2019 at  2:30 PM EDT by video enabled telemedicine application and verified that I am speaking with the correct person using two identifiers. 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 She was in status epilepticus arising from the left posterior temporal/left hemisphere in August 2019, requiring 4 AEDs to control seizures. She had an episode of loss of consciousness in September 2019 followed by left arm numbness. Repeat EEG in October 2019 showed left temporal slowing, no epileptiform discharges. She was weaned off oxcarbazepine, which was causing "trembles." The trembles have stopped. She continues on Levetiracetam 750mg  2 tabs BID and Lacosamide 200mg  BID without side effects and no seizures or seizure-like symptoms since 05/2018. She denies any gaps in time, staring/unresponsive episodes, olfactory/gustatory hallucinations, focal numbness/tingling/weakness. She continues to have word-finding difficulties and forgetfulness, she wonders if she already asked a question and repeats herself. She has not been driving. She has  forgotten bill payments but feels this is not significant. She denies missing medications. She has headaches around the time of her period, she has noticed a change in her mood for 10-11 days. She denies any dizziness, diplopia, no falls but notices she tends to veer to the right. She had called our office in January about her Plavix and aspirin, on review of records and patient report, patient states that when the enlarging aneurysm was seen, she was instructed by Dr. Kathyrn Sheriff to take aspirin and Plavix for 7 days, then to stop it for the procedure. It then appears that patient was restarted on it afterward. She clarifies again that she was not taking any antiplatelet medication in the past for secondary stroke prevention. She was instructed to switch to aspirin 81mg  daily and stop Plavix. She was seen by her PCP in March with note of elevated BP 146/82, she was instructed to monitor BP at home but has been unable to.   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 Hospitalization was complicated by status epilepticus. In 2018, she had a bad headache and episode of loss of consciousness with shaking at work and was found to have a subarachnoid hemorrhage secondary to ruptured aneurysm. She was discharged home on Keppra 1000mg  BID and denied any seizures or seizure-like activity until she was in the hospital for an elective procedure for enlarging aneurysm. She has no recollection of events post-op, she started having right arm and face seizure-like activity followed by a GTC. She also  had an episode of right gaze deviation with vocalization, no motor activity noted. She was globally aphasic. MRI brain done 05/22/18 showed a small region of cortical T2 FLAIR  hyperintensity centered within the left posterior temporal lobe with few areas of reduced diffusion. Findings probably represent seizure related activity although a component of subacute infarction is possible. No acute hemorrhage or mass effect. 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 She was intubated to achieve burst-suppression on Propofol, seizure recurrence seen with weaning Propofol, resolved with midazolam with no seizures after 05/19/18. She needed 4 AEDs, Dilantin, Keppra, Trileptal, and Vimpat. Dilantin was discontinued on hospital discharge, she was discharged on Trileptal 750mg  BID, Vimpat 200mg  BID, and Keppra 1500mg  BID.   She denies any side effects on medications. She denies any seizures since her hospitalization, her brother denies any staring/unresponsive episodes, no right-sided motor activity. She had an episode on 06/07/18 where she passed out and reported left arm numbness after. She does not recall having any focal weakness, there was no warning, she recalls it was very hot then she woke up on the ground. No tongue bite or incontinence. Head CT did not show any acute changes. She notices occasional right hand numbness when she lays on it in sleep. She denies any gaps in time, olfactory/gustatory hallucinations, deja vu, rising epigastric sensation, myoclonic jerks. She has had only one headache since coming home last month. She denies any dizziness, diplopia, dysarthria/dysphagia, neck/back pain, bowel/bladder dysfunction. She feels back to normal. She noticed some memory difficulties after the [facility], "have to think  about things a little longer," but denies missing medications.   Epilepsy Risk Factors:  SAH secondary to ruptured left ICA aneurysm, s/p coiling and flow diversion. Her paternal cousin has seizures. Otherwise she had a normal birth and early development.  There is no history of febrile convulsions, CNS infections such as meningitis/encephalitis, significant traumatic brain injury.    Observations/Objective:   Patient is awake, alert, oriented x 3. No aphasia or dysarthria. Intact fluency and comprehension. Remote and recent memory intact. Able to name and repeat. Cranial nerves: Extraocular movements intact with no nystagmus. No facial asymmetry. Motor: moves all extremities symmetrically, at least anti-gravity x 4. No incoordination on finger to nose testing. Gait: narrow-based and steady, able to tandem walk adequately. Negative Romberg test.  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 Hospitalization was complicated by status epilepticus arising from the left hemisphere. MRI brain showed a small region of cortical T2 FLAIR hyperintensity centered within the left posterior temporal lobe with few areas of reduced diffusion, findings probably represent seizure related activity although a component of subacute infarction is possible. No further seizures or seizure-like symptoms since September 2019. She has weaned off oxcarbazepine and is interested in further streamlining medications. We discussed weaning off Vimpat (cost-prohibitive) after she has been event-free for a year in 05/2019. She will call our office at that time for weaning instructions, for now continue Levetiracetam 1500mg  BID and Vimpat 200mg  BID. We again  discussed risks for breakthrough seizure with any medication  adjustment, she knows to call our office for any issues. She is aware of [place] driving laws to stop driving after a seizure, until 6 months seizure-free. Continue aspirin 81mg  daily. She will follow-up in 6 months and knows to call for any changes.   Follow Up Instructions:   -I discussed the assessment and treatment plan with the patient. The patient was provided an opportunity to ask questions and all were answered. The patient agreed with the plan and demonstrated an understanding of the instructions.   The patient was advised to call back or seek an in-person evaluation if the symptoms worsen or if the condition fails to improve as anticipated.   Cameron Sprang, MD

## 2019-03-31 IMAGING — DX DG CHEST 1V PORT
1 series · 1 of 1 positions shown · non-contrast
Comparison: CT 03/07/2017.  Chest x-ray 03/07/2017.

CLINICAL DATA: Bronchoscopy.

EXAM:
PORTABLE CHEST 1 VIEW

[chest ap]
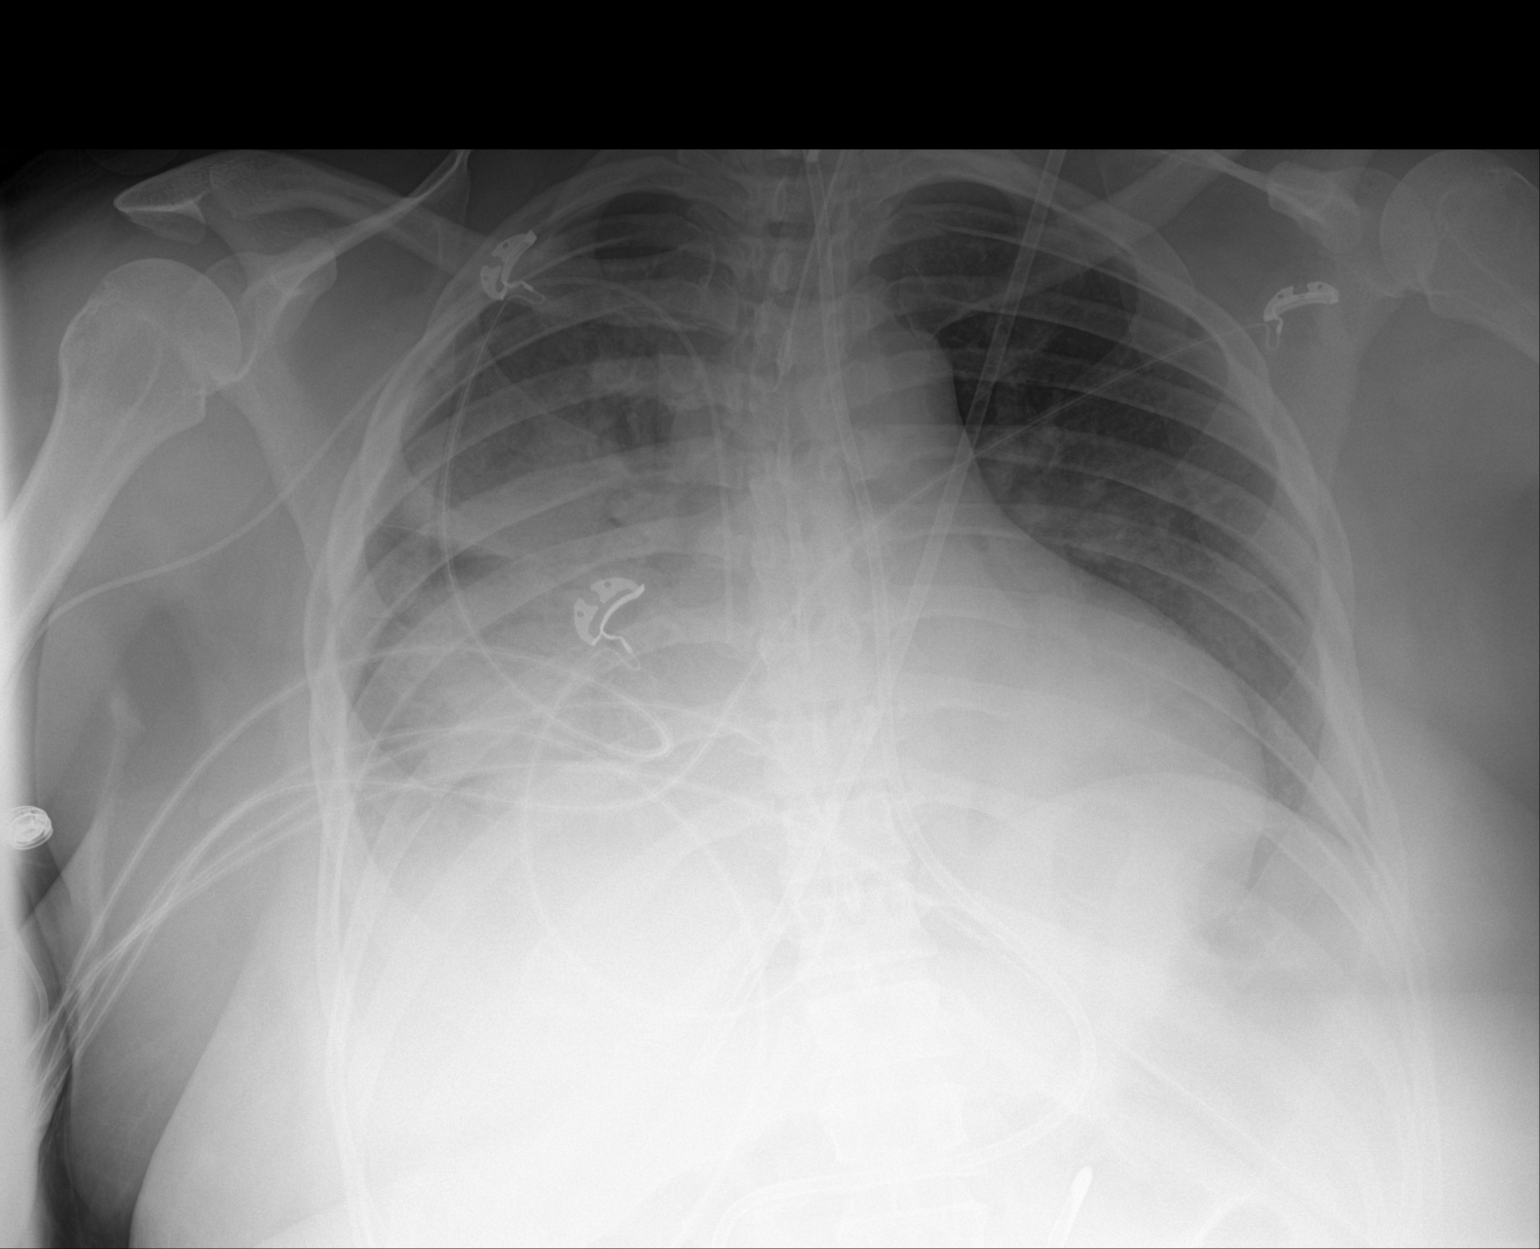

[1 of 1 positions shown; findings below may reference images not displayed]

FINDINGS: Endotracheal tube, feeding tube, right PICC line noted in stable
position. Heart size normal. Bilateral pulmonary infiltrates/edema,
right side greater than left again noted. No interim improvement.
Small right pleural effusion unchanged. No pneumothorax .
IMPRESSION: 1. Lines and tubes in stable position.

2. Bilateral pulmonary infiltrates/edema, right side greater left.
No interim change from prior exam. Small right pleural effusion
unchanged.

## 2019-03-31 IMAGING — CT CT ANGIO HEAD
1 of 12 series · 5 of 33 positions shown · IV contrast (isovue)
Comparison: Chest CT today reported separately.

CLINICAL DATA: 45-year-old female who presented with acute ruptured
left supraclinoid ICA aneurysm, status post endovascular coiling on
03/03/2017. Worsening mental and clinical status this morning,
accompanied by high fever of 104 F. Query vasospasm.

EXAM:
CT HEAD WITHOUT CONTRAST
CT ANGIOGRAPHY HEAD AND NECK
TECHNIQUE: Multidetector CT imaging of the head and neck was performed using
the standard protocol prior to and during bolus administration of
intravenous contrast. Multiplanar CT image reconstructions and MIPs
were obtained to evaluate the vascular anatomy. Carotid stenosis
measurements (when applicable) are obtained utilizing NASCET
criteria, using the distal internal carotid diameter as the
denominator.
CONTRAST:  100 mL Isovue 370

[Series 11: cta neck axial · axial · 0.39mm/px · z∈[-265,-33]mm · 5 of 349 slices shown]
[im 59/349  soft-tissue]
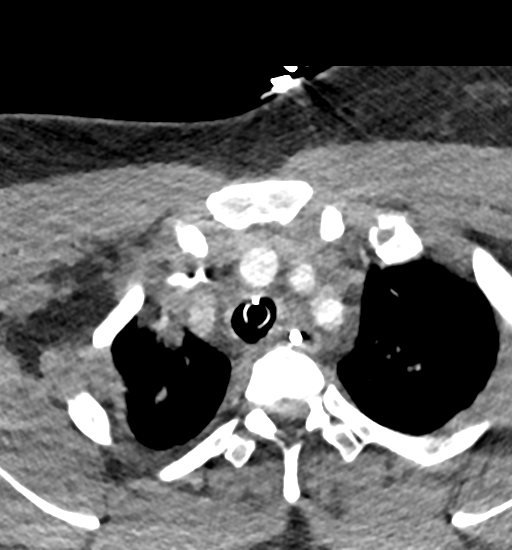
[im 117/349  bone]
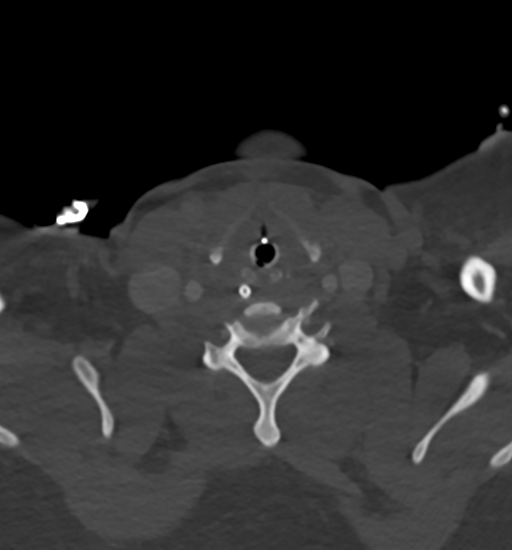
[im 175/349  soft-tissue]
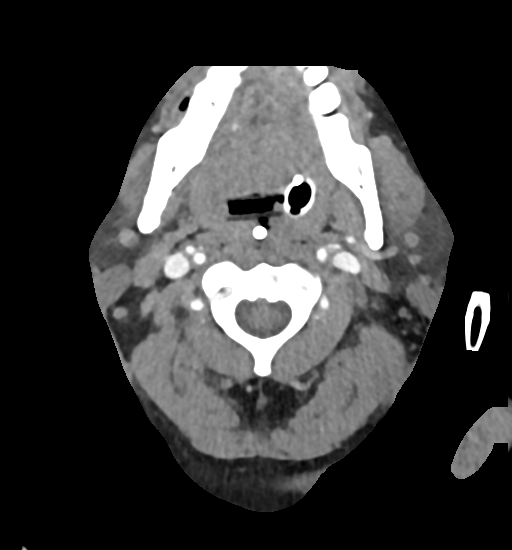
[im 233/349  bone]
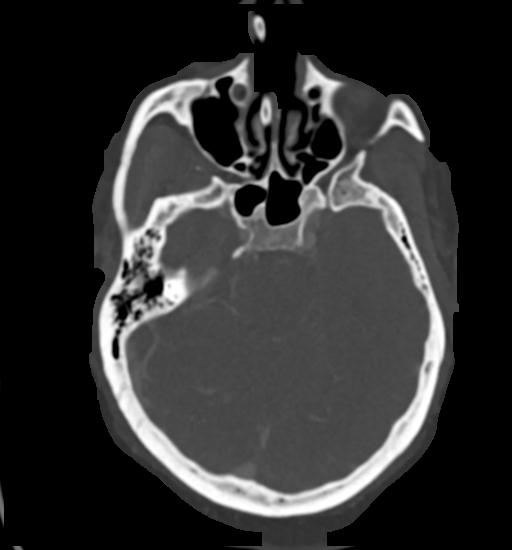
[im 291/349  soft-tissue]
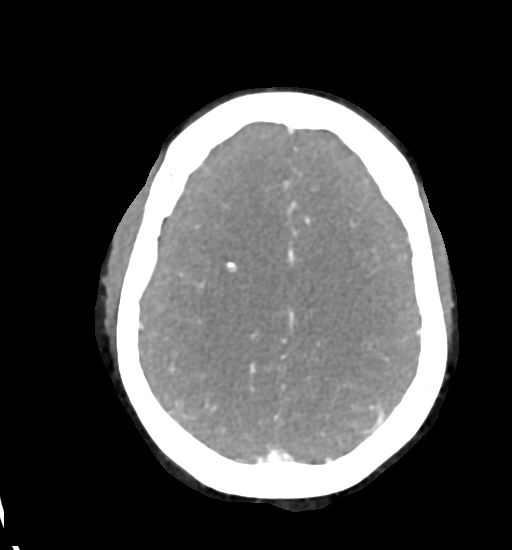

[5 of 33 positions shown; findings below may reference images not displayed]

CT head without
contrast 03/05/2017. Cerebral angiogram/intervention, and head and
neck CTA/CT 03/03/2017.
FINDINGS: CT HEAD

Brain: Stable right frontal approach EVD. Continued large volume
hyperdense intraventricular hemorrhage, worst in the left lateral
ventricle. Stable ventricle size and configuration since 03/05/2017.

Left inferior frontal gyrus parenchymal hemorrhage size and
configuration also is stable since 03/05/2017, [DATE] x 22 mm AP by
transverse. Surrounding edema. Regional mass effect appears stable.

No definite loss of gray-white matter differentiation in the
hemispheres or posterior fossa. No cortically based acute infarct
identified.

Calvarium and skull base: Stable right frontal burr hole. No acute
osseous abnormality identified.

Paranasal sinuses: Visualized paranasal sinuses and mastoids are
stable and well pneumatized. Right side nasoenteric tube in place.

Orbits: Mild right frontal scalp hematoma. Stable orbit and scalp
soft tissues.

CTA NECK

Skeleton: No acute osseous abnormality identified.

Upper chest: Reported on chest CT separately today.

Other neck: Endotracheal tube and nasoenteric tubes course as
expected through the neck.

Small volume of fluid in the pharynx. Otherwise bilateral neck soft
tissues are within normal limits. Cervical lymph nodes are stable
and within normal limits.

Aortic arch: Stable an normal, 3 vessel arch configuration.

Right carotid system: Stable and negative aside from mild
tortuosity.

Left carotid system: Stable and negative aside from mild tortuosity.

Vertebral arteries:

No proximal subclavian artery stenosis. There is an early origin of
the left vertebral artery from the proximal left subclavian.
However, that origin is less well visualized today. This might be
related to motion artifact, but new left vertebral artery stenosis
is difficult to exclude. However, the left vertebral artery
otherwise demonstrates stable enhancement to the skullbase.

Right vertebral artery is stable and normal aside from tortuosity.

CTA HEAD

Posterior circulation: Distal vertebral artery caliber and
enhancement is stable. The left vertebral artery functionally
terminates in PICA as before. Right PICA origin is patent. Basilar
artery caliber and enhancement appears stable. Fetal type bilateral
PCA origins, more so the left. SCA origins remain patent. Bilateral
PCA branches appear irregular throughout, but stable. There is more
intracranial venous contamination on the study today.

Anterior circulation: Bilateral ICA siphon caliber air and
enhancement is stable to the carotid termini. No siphon stenosis.
Increased cavernous sinus venous contamination today. Supraclinoid
left ICA coil pack corresponding to the site of the superiorly
directed aneurysm seen on the pretreatment CTA. Regional streak
artifact. No aneurysmal enhancement is evident.

Carotid termini, MCA and ACA origins remain patent. Carotid terminus
caliber appear stable from the prior CTA. There is increased venous
contrast along the MCA branches today. Proximal ACA A2 branches
demonstrate improved patency and caliber. Median artery of the
corpus callosum demonstrated. Left MCA branches appear stable in
caliber and enhancement. Right MCA branches appear stable to mildly
improved in caliber and enhancement.

No active contrast extravasation today.

Venous sinuses: Patent.

Anatomic variants: Early left vertebral artery origin from the
subclavian. Distal left vertebral artery functionally terminates in
PICA. Fetal type PCA origins suspected.

Delayed phase: No abnormal intracranial enhancement identified.

Review of the MIP images confirms the above findings
IMPRESSION: 1. Satisfactory post coil embolization appearance of the left
supraclinoid ICA aneurysm.
2. Continued intracranial artery vasospasm suspected, but improved
caliber and enhancement of the bilateral ACAs, and perhaps also the
right MCA branches since the presentation CTA. Left MCA and
bilateral PCA Vasa spasm appears unchanged.
3. Questionable new high-grade stenosis at the left vertebral artery
origin. The left vertebral artery otherwise appears stable and
within normal limits throughout its course. It functionally
terminates in PICA.
4. Otherwise stable arterial findings in the neck.
5. Stable CT appearance of the brain since 03/05/2017. Continued
large volume intraventricular hemorrhage. Stable EVD and ventricle
size. Stable left inferior frontal gyrus hematoma.
6. See also Chest CT today reported separately.

## 2019-03-31 IMAGING — DX DG CHEST 1V PORT
1 series · 1 of 1 positions shown · non-contrast
Comparison: 03/06/2017

CLINICAL DATA: Ventilator dependent.

EXAM:
PORTABLE CHEST 1 VIEW

[chest ap]
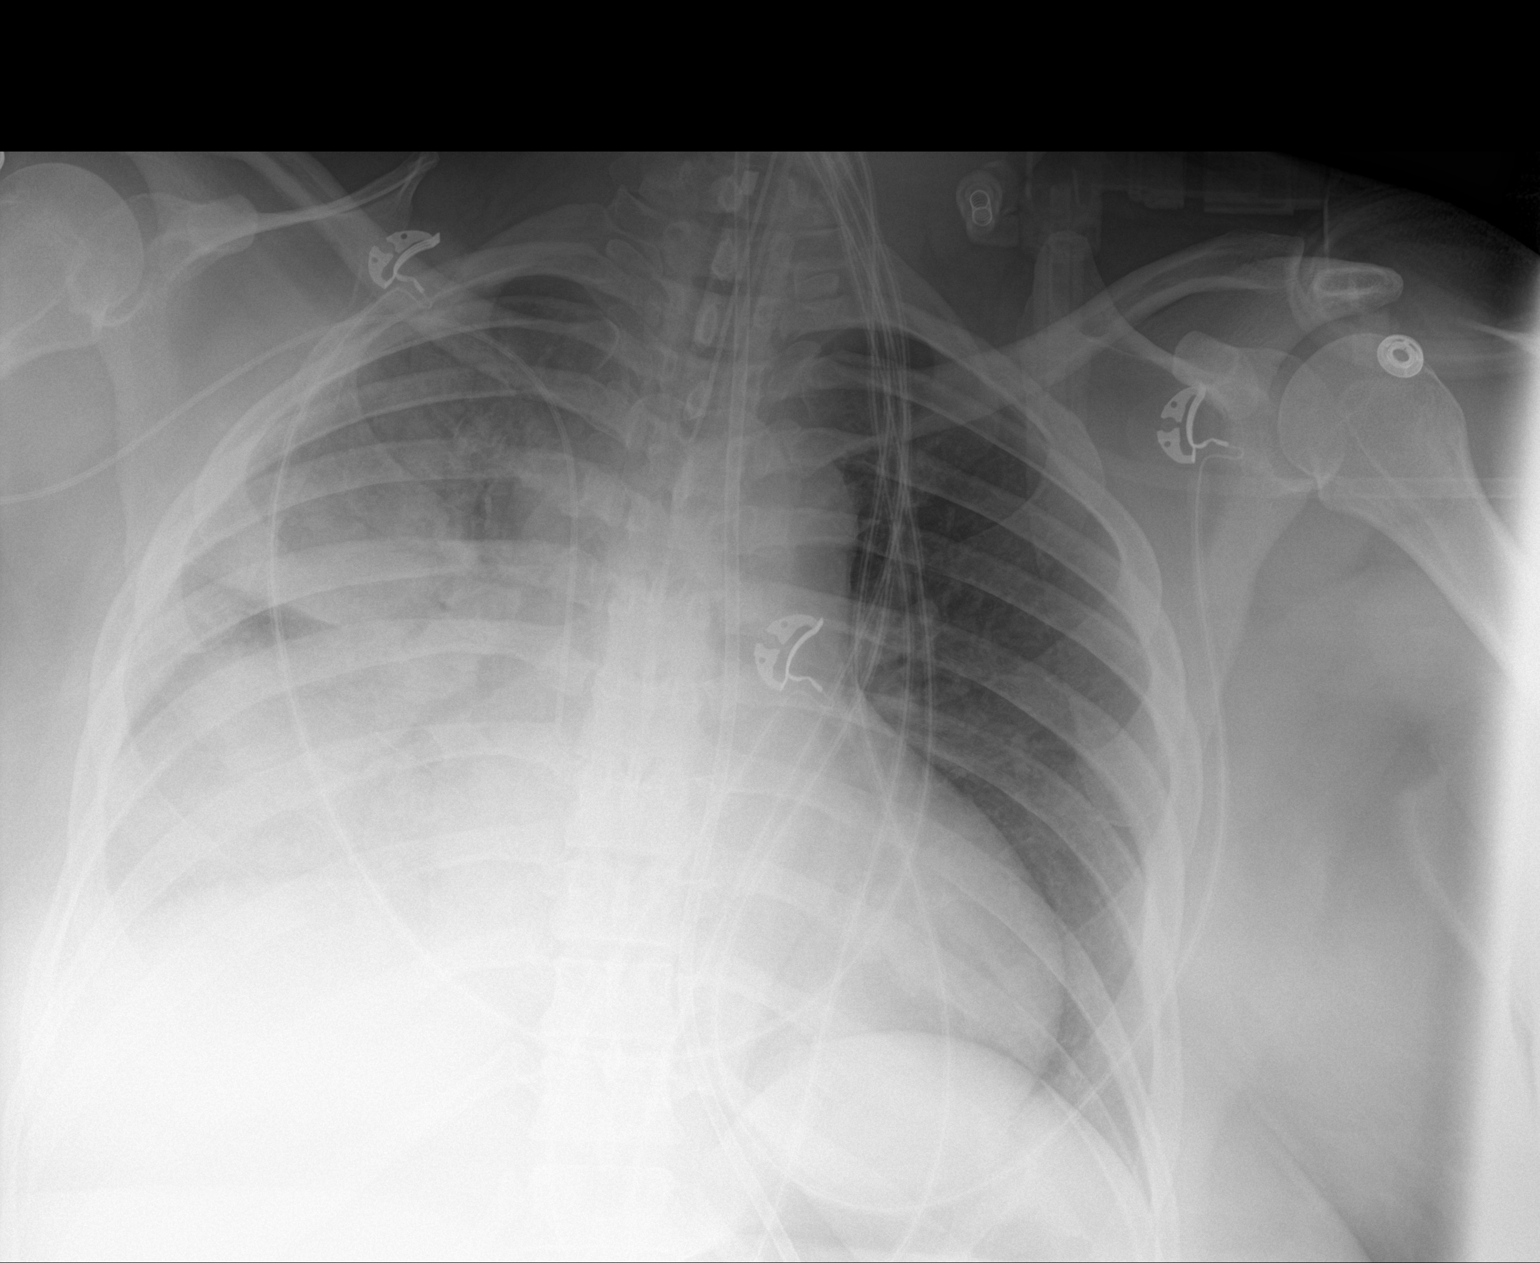

[1 of 1 positions shown; findings below may reference images not displayed]

FINDINGS: Enteric tube terminates at the level of the clavicles, 3.7 cm above
the carina. A right PICC has been placed and terminates over the
lower SVC. Enteric tube courses into the left upper abdomen with tip
not imaged. The cardiac silhouette is mildly enlarged. There is
persistent mild elevation of the right hemidiaphragm. There is new
extensive consolidation throughout the right mid lung and right lung
base with air bronchograms. The left lung is clear. There may be a
small right pleural effusion. No pneumothorax is identified.
IMPRESSION: 1. New extensive right lung airspace disease which may represent
pneumonia.
2. Right PICC placement as above.

## 2019-04-02 IMAGING — CR DG CHEST 1V PORT
1 series · 1 of 1 positions shown · non-contrast
Comparison: 03/08/2017

CLINICAL DATA: Pneumonia.

EXAM:
PORTABLE CHEST 1 VIEW

[AP]
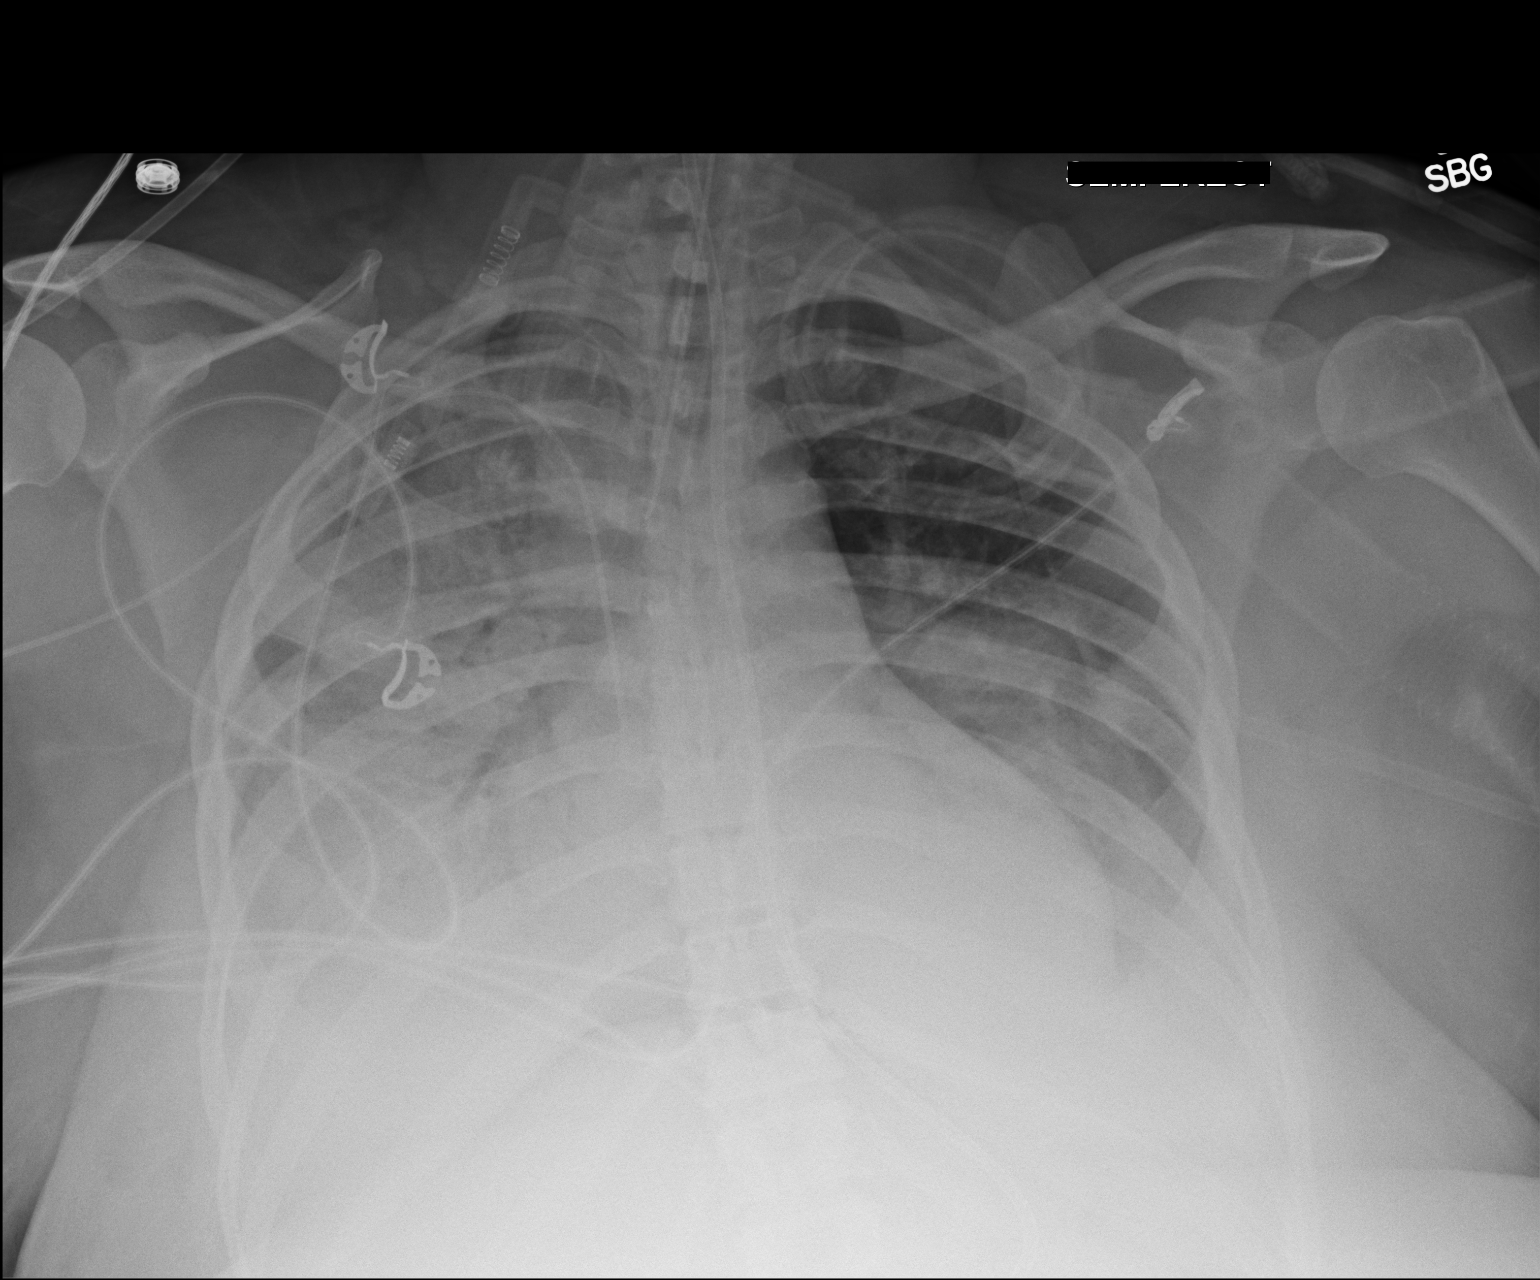

[1 of 1 positions shown; findings below may reference images not displayed]

FINDINGS: Endotracheal tube with tip 1.7 cm above the carina. Right-sided PICC
line unchanged with tip over the SVC. Enteric tube courses into the
stomach and off the inferior portion of the film. Lungs are
hypoinflated with stable moderate bilateral airspace opacification
right worse than left. Slight worsening opacification in the lung
bases possibly developing effusions. Cardiomediastinal silhouette
and remainder of the exam is unchanged.
IMPRESSION: Stable moderate bilateral airspace process right worse than left.
Slight worsening bibasilar density possibly developing effusions.

Tubes and lines as described.

## 2019-04-13 IMAGING — DX DG ABD PORTABLE 1V
1 series · 1 of 1 positions shown · non-contrast
Comparison: None.

CLINICAL DATA: Ileus

EXAM:
PORTABLE ABDOMEN - 1 VIEW

[abdomen kub]
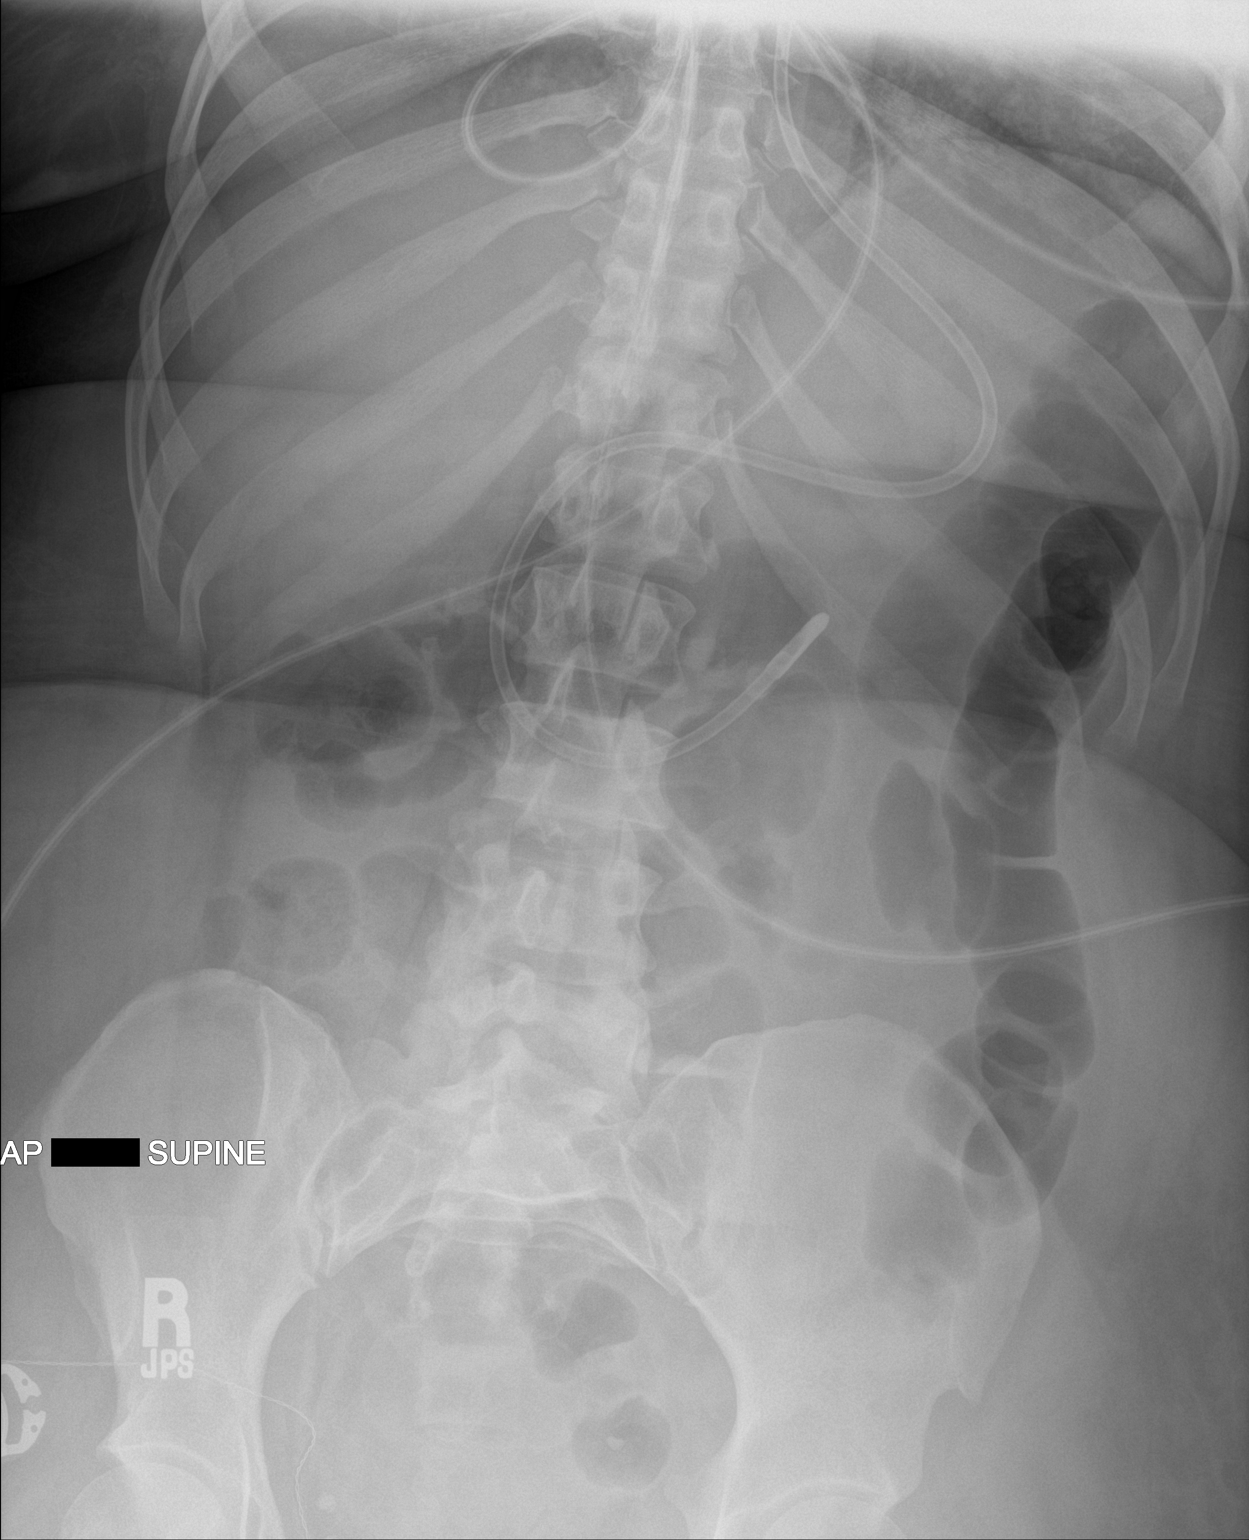

[1 of 1 positions shown; findings below may reference images not displayed]

FINDINGS: Enteric tube tip overlies expected location of the DJJ.

Nonobstructive bowel gas pattern. No supine evidence of
pneumoperitoneum. No definite pneumatosis or portal venous gas.

Punctate phleboliths overlie the lower pelvis. Otherwise, no
abnormal intra-abdominal calcifications.

Limited visualization of lower thorax is normal

No acute osseus abnormalities.
IMPRESSION: 1. Nonobstructive bowel gas pattern.
2. Weighted enteric tube tip overlies the expected location of the
DJJ.

## 2019-07-30 ENCOUNTER — Other Ambulatory Visit: Payer: Self-pay | Admitting: Neurology

## 2019-07-30 ENCOUNTER — Telehealth: Payer: Self-pay | Admitting: Neurology

## 2019-07-30 NOTE — Telephone Encounter (Signed)
Pt has follow up with Dr. Delice Lesch 08/09/19. Vimpat refilled today.

## 2019-07-30 NOTE — Telephone Encounter (Signed)
Patient called regarding her visit with Dr. Tomi Likens on 08/09/19. She said she l runout of her medication prior to the appointment. She uses [facility] in [place] on [address]. Thank you

## 2019-07-30 NOTE — Telephone Encounter (Signed)
Medication is Vimpat. Thank you

## 2019-08-02 ENCOUNTER — Other Ambulatory Visit: Payer: Self-pay | Admitting: Neurology

## 2019-08-02 MED ORDER — LACOSAMIDE 200 MG PO TABS: 200.0000 mg | ORAL_TABLET | Freq: Two times a day (BID) | ORAL | 5 refills | Status: DC

## 2019-08-09 ENCOUNTER — Encounter: Payer: Self-pay | Admitting: Neurology

## 2019-08-09 ENCOUNTER — Telehealth (INDEPENDENT_AMBULATORY_CARE_PROVIDER_SITE_OTHER): Payer: 59 | Admitting: Neurology

## 2019-08-09 ENCOUNTER — Other Ambulatory Visit: Payer: Self-pay

## 2019-08-09 VITALS — Ht 62.5 in | Wt 198.0 lb

## 2019-08-09 DIAGNOSIS — G40201 Localization-related (focal) (partial) symptomatic epilepsy and epileptic syndromes with complex partial seizures, not intractable, with status epilepticus: Secondary | ICD-10-CM

## 2019-08-09 DIAGNOSIS — Z8679 Personal history of other diseases of the circulatory system: Secondary | ICD-10-CM

## 2019-08-09 MED ORDER — LEVETIRACETAM 750 MG PO TABS: 1500.0000 mg | ORAL_TABLET | Freq: Two times a day (BID) | ORAL | 11 refills | Status: DC

## 2019-08-09 NOTE — Progress Notes (Signed)
Virtual Visit via Telephone Note The purpose of this virtual visit is to provide medical care while limiting exposure to the novel coronavirus.    Consent was obtained for phone visit:  Yes.   Answered questions that patient had about telehealth interaction:  Yes.   I discussed the limitations, risks, security and privacy concerns of performing an evaluation and management service by telephone. I also discussed with the patient that there may be a patient responsible charge related to this service. The patient expressed understanding and agreed to proceed.  Pt location: Home Physician Location: office Name of referring provider:  [name], MD I connected with .Carly [name] at patients initiation/request on 08/09/2019 at  2:30 PM EST by telephone and verified that I am speaking with the correct person using two identifiers.  Pt MRN:  [id number] Pt DOB:  02/25/1971   History of Present Illness:  The patient had a telephone visit on 08/09/2019. Unable to connect via video due to technical difficulties. 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 She was in status epilepticus in August 2019 with seizures arising from the left posterior temporal/left hemisphere, requiring 4 AEDs for seizure control. She had an episode of loss of consciousness in September 2019 followed by left arm numbness. Repeat EEG in October 2019 showed left temporal slowing. She has weaned off oxcarbazepine, and would like to continue streamlining medications. She is currently on Levetiracetam 1500mg  BID (750mg  2 tabs BID) and Lacosamide 200mg  BID without side effects. She denies any seizures or seizure-like symptoms. She denies any staring/unresponsive episodes, gaps in time, olfactory/gustatory hallucinations, focal numbness/tingling/weakness, myoclonic jerks. She has catamenial headaches, usually lasting a week, with a mild annoying headache, no nausea/vomiting,  vision changes. She reports BP at PCP office recently was good. She has occasional dizziness when she is out in the sun. She denies any falls.   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 Hospitalization was complicated by status epilepticus. In 2018, she had a bad headache and episode of loss of consciousness with shaking at work and was found to have a subarachnoid hemorrhage secondary to ruptured aneurysm. She was discharged home on Keppra 1000mg  BID and denied any seizures or seizure-like activity until she was in the hospital for an elective procedure for enlarging aneurysm. She has no recollection of events post-op, she started having right arm and face seizure-like activity followed by a GTC. She also had an episode of right gaze deviation with vocalization, no motor activity noted. She was globally aphasic. MRI brain done 05/22/18 showed a small region of cortical T2 FLAIR hyperintensity centered within the left posterior temporal lobe with few areas of reduced diffusion. Findings probably represent seizure related activity although a component of subacute infarction is possible. No acute hemorrhage or mass effect. 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 She was intubated to achieve burst-suppression on Propofol, seizure recurrence seen with weaning Propofol, resolved with  midazolam with no seizures after 05/19/18. She needed 4 AEDs, Dilantin, Keppra, Trileptal, and Vimpat. Dilantin was discontinued on hospital discharge, she was discharged on Trileptal 750mg  BID, Vimpat 200mg  BID, and Keppra 1500mg  BID.   She denies any side effects on medications. She denies any seizures since her hospitalization, her brother denies any staring/unresponsive episodes, no right-sided motor activity. She had an episode on 06/07/18 where she passed out and reported left arm numbness after. She does not recall having any focal weakness, there was no warning, she recalls it was very hot then she woke up on the ground. No tongue bite or incontinence. Head CT did not show any acute changes. She notices occasional right hand numbness when she lays on it in sleep. She denies any gaps in time, olfactory/gustatory hallucinations, deja vu, rising epigastric sensation, myoclonic jerks. She has had only one headache since coming home last month. She denies any dizziness, diplopia, dysarthria/dysphagia, neck/back pain, bowel/bladder dysfunction. She feels back to normal. She noticed some memory difficulties after the [facility], "have to think about things a little longer," but denies missing medications.   Epilepsy Risk Factors:  SAH secondary to ruptured left ICA aneurysm, s/p coiling and flow diversion. Her paternal cousin has seizures. Otherwise she had a normal birth and early development.  There is no history of febrile convulsions, CNS infections such as meningitis/encephalitis, significant traumatic brain injury.     Current Outpatient Medications on File Prior to Visit  Medication Sig Dispense Refill  . acetaminophen (TYLENOL) 325 MG tablet Take 2 tablets (650 mg total) by mouth every 6 (six) hours as needed for fever.    . lacosamide (VIMPAT) 200 MG TABS tablet Take 1 tablet (200 mg total) by mouth 2 (two) times daily. 60 tablet 5  . levETIRAcetam (KEPPRA) 750 MG tablet Take 2 tablets (1,500 mg total)  by mouth 2 (two) times daily. 120 tablet 11  . metoprolol tartrate (LOPRESSOR) 50 MG tablet     . Multiple Vitamin (MULTIVITAMIN WITH MINERALS) TABS tablet Take 1 tablet by mouth daily. (Patient taking differently: Take 1 tablet by mouth daily. Women's Ultra GNC Multivitamin) 30 tablet 0   No current facility-administered medications on file prior to visit.      Observations/Objective:  Limited due to nature of phone visit. Patient is awake, alert, able to answer questions without dysarthria or confusion.   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 Hospitalization was complicated by status epilepticus arising from the left hemisphere. MRI brain in 2019 showed a small region of cortical T2 FLAIR hyperintensity centered within the left posterior temporal lobe with few areas of reduced diffusion, findings probably represent seizure related activity although a component of subacute infarction is possible. She has been event-free since September 2019 and would like to continue streamlining medications. We discussed weaning off Lacosamide, instructions given (take 1/2 tab in AM, 1 tab in PM for a week, then 1/2 tab BID for a week, then 1/2 tab qhs for a week, then stop). Continue Levetiracetam 1500mg  BID. Interval follow-up for aneurysm will be ordered, schedule MRI brain with and without contrast and MRA head without contrast. Continue aspirin 81mg  daily. We again discussed risks for breakthrough seizure with any medication adjustment, she knows to call our office for any issues. She is aware  of [place] driving laws to stop driving after a seizure, until 6 months seizure-free. She will follow-up in 6 months and knows to call for any changes.   Follow Up Instructions:   -I discussed the  assessment and treatment plan with the patient. The patient was provided an opportunity to ask questions and all were answered. The patient agreed with the plan and demonstrated an understanding of the instructions.   The patient was advised to call back or seek an in-person evaluation if the symptoms worsen or if the condition fails to improve as anticipated.  Total Time spent in visit with the patient was:  15 minutes, of which 100% of the time was spent in counseling and/or coordinating care on the above.   Pt understands and agrees with the plan of care outlined.       Cameron Sprang, MD

## 2019-09-15 ENCOUNTER — Ambulatory Visit
Admission: RE | Admit: 2019-09-15 | Discharge: 2019-09-15 | Disposition: A | Discharge status: Home / Self Care | Payer: 59 | Source: Ambulatory Visit | Attending: Neurology | Admitting: Neurology

## 2019-09-15 ENCOUNTER — Other Ambulatory Visit: Payer: Self-pay

## 2019-09-15 DIAGNOSIS — Z8679 Personal history of other diseases of the circulatory system: Secondary | ICD-10-CM

## 2019-09-15 DIAGNOSIS — G40201 Localization-related (focal) (partial) symptomatic epilepsy and epileptic syndromes with complex partial seizures, not intractable, with status epilepticus: Secondary | ICD-10-CM

## 2019-09-15 MED ORDER — GADOBENATE DIMEGLUMINE 529 MG/ML IV SOLN
18.0000 mL | Freq: Once | INTRAVENOUS | Status: AC | PRN
  Administered 2019-09-15: 18 mL via INTRAVENOUS

## 2019-09-27 ENCOUNTER — Telehealth: Payer: Self-pay | Admitting: Neurology

## 2019-09-27 NOTE — Telephone Encounter (Signed)
Dr. Delice Lesch completed form.  Pt confirmed that it was only one page that she sent and needed from Dr. Delice Lesch. Other physicians have completed their part. Pt will come by today or tomorrow and pick up the form. Form up front for pt.

## 2019-09-27 NOTE — Telephone Encounter (Signed)
Dr. Delice Lesch   Will you please look on your desk for these? Thank you.

## 2019-09-27 NOTE — Telephone Encounter (Signed)
Patient states that she brought in paperwork last week and needs to get that back ASAP it is due on 09-29-19. She would like to know if she can come by today and pick that up  Please call

## 2020-01-12 DIAGNOSIS — Z0279 Encounter for issue of other medical certificate: Secondary | ICD-10-CM

## 2020-01-26 ENCOUNTER — Telehealth: Payer: Self-pay

## 2020-01-26 NOTE — Telephone Encounter (Signed)
Can you pls let her know I have the form, but we have not touched on her disability with her visits. She has not had any seizures since 2019. Is there another reason she is disabled? From a seizure standpoint, they will not consider this a disability if she has not had any seizures since 2019.

## 2020-01-26 NOTE — Telephone Encounter (Signed)
Pt called asking about her disability paperwork?

## 2020-01-27 NOTE — Telephone Encounter (Signed)
Pls request records from Dr. Kathyrn Sheriff, thanks

## 2020-01-28 NOTE — Telephone Encounter (Signed)
Request faxed

## 2020-01-28 NOTE — Telephone Encounter (Signed)
Spoke to pt she is going to Dr Kathyrn Sheriff off to sign a release of information to have them sent to our office, when I faxed for release of records I was sent a fax they didn't have any records for her,

## 2020-03-08 ENCOUNTER — Telehealth: Payer: Self-pay

## 2020-03-08 NOTE — Telephone Encounter (Signed)
-----   Message from Cameron Sprang, MD sent at 03/07/2020  4:52 PM EDT ----- Regarding: disability form She will not be happy about this, but pls let her know I received the notes from Dr. Cleotilde Neer office, and there is no mention about disability. From a seizure standpoint, they will not consider this a disability if she has not had any seizures since 2019. If I fill this out for her from seizure standpoint, this will not be in her best interest. Better for her to contact Dr. Kathyrn Sheriff for a follow-up to discuss this, or discuss with her PCP. Thanks

## 2020-03-08 NOTE — Telephone Encounter (Signed)
Patient advised that Dr. Delice Lesch could not do the disability.

## 2020-03-13 ENCOUNTER — Other Ambulatory Visit: Payer: Self-pay

## 2020-03-13 ENCOUNTER — Telehealth (INDEPENDENT_AMBULATORY_CARE_PROVIDER_SITE_OTHER): Payer: 59 | Admitting: Neurology

## 2020-03-13 ENCOUNTER — Encounter: Payer: Self-pay | Admitting: Neurology

## 2020-03-13 VITALS — Ht 62.5 in | Wt 189.0 lb

## 2020-03-13 DIAGNOSIS — G40201 Localization-related (focal) (partial) symptomatic epilepsy and epileptic syndromes with complex partial seizures, not intractable, with status epilepticus: Secondary | ICD-10-CM | POA: Diagnosis not present

## 2020-03-13 DIAGNOSIS — Z8679 Personal history of other diseases of the circulatory system: Secondary | ICD-10-CM

## 2020-03-13 DIAGNOSIS — I69319 Unspecified symptoms and signs involving cognitive functions following cerebral infarction: Secondary | ICD-10-CM | POA: Diagnosis not present

## 2020-03-13 MED ORDER — LEVETIRACETAM 750 MG PO TABS: 1500.0000 mg | ORAL_TABLET | Freq: Two times a day (BID) | ORAL | 11 refills | Status: DC

## 2020-03-13 NOTE — Progress Notes (Signed)
Virtual Visit via Video Note Carly purpose of Carly virtual visit is to provide medical care while limiting exposure to Carly novel coronavirus.    Consent was obtained for video visit:  Yes.   Answered questions that [name] had about telehealth interaction:  Yes.   I discussed Carly limitations, risks, security and privacy concerns of performing an evaluation and management service by telemedicine. I also discussed with Carly [name] that there may be a [name] responsible charge related to Carly service. Carly [name] expressed understanding and agreed to proceed.  Pt location: Home Physician Location: office Name of referring provider:  [name], MD I connected with Carly [name] at patients initiation/request on 03/13/2020 at  3:30 PM EDT by video enabled telemedicine application and verified that I am speaking with Carly correct person using two identifiers. Pt MRN:  [id number] Pt DOB:  [birth year] Video Participants:  Carly [name]   History of Present Illness:  Carly [name] had a virtual video visit on 03/13/2020. Carly [name] was last evaluated in Carly neurology clinic by telephone 7 months ago. 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 Carly [name] had an episode of loss of consciousness in September 2019 followed by left arm numbness. Repeat EEG in October 2019 showed left temporal slowing. Carly [name] has been seizure-free since then and has weaned off oxcarbazepine and lacosamide. Carly [name] continues on Levetiracetam 1500mg  BID without any seizures. Carly [name] has occasional episodes where it smells like something is burning, lasting a few seconds, around every 2 weeks. No associated confusion or focal symptoms. Carly other night her left leg/foot felt like it was trying to curl up while Carly [name] was standing, it went away when Carly [name] sat down. No neck/back pain. Carly [name] has headaches  once a month described as aggravating, relieved with Tylenol. Carly [name] denies any falls. Carly [name] feels sleepy sometimes. Since her surgery, Carly [name] continues to have memory and communication issues. Carly [name] struggles with Carly next word Carly [name] is supposed to say, writing as Carly [name] is talking. Carly [name] has a pad all Carly time so Carly [name] does not forget what Carly [name] is supposed to say. Carly [name] would speak Carly same sentence sometimes. Carly [name] denies missing medications, Carly [name] has an alarm. Carly [name] drives short distances, Carly [name] went through a light one time and just for a second made Carly wrong turn.   I personally reviewed follow-up MRI brain and MRA head done in 08/2019 which did not show any acute changes. There was encephalomalacia in Carly inferomedial left frontal lobe related to prior hemorrhage, diffuse siderosis in Carly ventricular ependyma, cerebellar folia, and brainstem related to remote subarachnoid hemorrhage. Linear gliosis and a small amount of chronic blood products in Carly right frontal lobe are unchanged and related to previous ventriculostomy catheter placement. There was a small chronic cortical infarct laterally in Carly left occipital lobe. There is near complete left ICA signal loss due to artifact from stent and aneurysm coils, no residual aneurysm present. No new aneurysm seen.    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 Hospitalization was complicated by status epilepticus. In 2018, Carly [name] had a bad headache and episode of loss of consciousness with shaking at work and was  found to have a subarachnoid hemorrhage secondary to ruptured aneurysm. Carly [name] was discharged home on Keppra 1000mg  BID and denied any seizures or seizure-like activity until Carly [name] was in Carly hospital for an elective procedure  for enlarging aneurysm. Carly [name] has no recollection of events post-op, Carly [name] started having right arm and face seizure-like activity followed by a GTC. Carly [name] also had an episode of right gaze deviation with vocalization, no motor activity noted. 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 MRI brain done 05/22/18 showed a small region of cortical T2 FLAIR hyperintensity centered within Carly left posterior temporal lobe with few areas of reduced diffusion. Findings probably represent seizure related activity although a component of subacute infarction is possible. No acute hemorrhage or mass effect. 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 There was diffuse siderosis of Carly ventricular ependyma, brainstem, and cerebellar folia.Carly [name] underwent long term EEG monitoring which showed epileptiform activity over Carly left posterior temporal region, with partial status epilepticus arising from Carly left hemisphere seen. Carly [name] was intubated to achieve burst-suppression on Propofol, seizure recurrence seen with weaning Propofol, resolved with midazolam with no seizures after 05/19/18. Carly [name] needed 4 AEDs, Dilantin, Keppra, Trileptal, and Vimpat. Dilantin was discontinued on hospital discharge, Carly [name] was discharged on Trileptal 750mg  BID, Vimpat 200mg  BID, and Keppra 1500mg  BID.   Carly [name] denies any side effects on medications. Carly [name] denies any seizures since her hospitalization, her brother denies any staring/unresponsive episodes, no right-sided motor activity. Carly [name] had an episode on 06/07/18 where Carly [name] passed out and reported left arm numbness after. Carly [name] does not recall having any focal weakness, there was no warning, Carly [name] recalls it was very hot then Carly [name] woke up on Carly ground. No tongue bite or incontinence. Head CT did not show any acute changes. Carly [name] notices occasional right hand numbness when Carly [name] lays on it in sleep. Carly [name] denies any gaps in time, olfactory/gustatory  hallucinations, deja vu, rising epigastric sensation, myoclonic jerks. Carly [name] has had only one headache since coming home last month. Carly [name] denies any dizziness, diplopia, dysarthria/dysphagia, neck/back pain, bowel/bladder dysfunction. Carly [name] feels back to normal. Carly [name] noticed some memory difficulties after Carly [facility], "have to think about things a little longer," but denies missing medications.   Epilepsy Risk Factors:  SAH secondary to ruptured left ICA aneurysm, s/p coiling and flow diversion. Her paternal cousin has seizures. Otherwise Carly [name] had a normal birth and early development.  There is no history of febrile convulsions, CNS infections such as meningitis/encephalitis, significant traumatic brain injury.    Current Outpatient Medications on File Prior to Visit  Medication Sig Dispense Refill  . acetaminophen (TYLENOL) 325 MG tablet Take 2 tablets (650 mg total) by mouth every 6 (six) hours as needed for fever.    Marland Kitchen aspirin EC 81 MG tablet Take 81 mg by mouth daily. Swallow whole.    . lacosamide (VIMPAT) 200 MG TABS tablet Take 1 tablet (200 mg total) by mouth 2 (two) times daily. 60 tablet 5  . levETIRAcetam (KEPPRA) 750 MG tablet Take 2 tablets (1,500 mg total) by mouth 2 (two) times daily. 120 tablet 11  . metoprolol tartrate (LOPRESSOR) 50 MG tablet     . Multiple Vitamin (MULTIVITAMIN WITH MINERALS) TABS tablet Take 1 tablet by mouth daily. ([name] taking differently: Take 1 tablet by mouth daily. Women's Ultra GNC Multivitamin) 30 tablet 0   No current facility-administered medications on file prior to visit.     Observations/Objective:   Vitals:  03/13/20 1050  Weight: 189 lb (85.7 kg)  Height: 5' 2.5" (1.588 m)   GEN:  Carly [name] appears stated age and is in NAD.  Neurological examination: [name] is awake, alert, oriented x 3. No aphasia or dysarthria. Intact fluency and comprehension. Remote and recent memory intact. Cranial nerves: Extraocular movements intact with no nystagmus.  No facial asymmetry. Motor: moves all extremities symmetrically, at least anti-gravity x 4.    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 Hospitalization was complicated by status epilepticus arising from Carly left hemisphere requiring multiple AEDs. Carly [name] has not had any further events since September 2019, we have streamlined medications, now on monotherapy with Levetiracetam 1500mg  BID. Follow-up MRI showed encephalomalacia in Carly inferomedial left frontal lobe and chronic cortical infarct in left occipital lobe. Continue daily aspirin and control of vascular risk factors. Carly [name] continues to report cognitive/communication issues since her surgery and is on disability for Carly, Neurocognitive testing will be ordered to further evaluate cognitive changes. Carly [name] is aware of [place] driving laws to stop driving after a seizure, until 6 months seizure-free. Carly [name] will follow-up in 6 months and knows to call for any changes.   Follow Up Instructions:   -I discussed Carly assessment and treatment plan with Carly [name]. Carly [name] was provided an opportunity to ask questions and all were answered. Carly [name] agreed with Carly plan and demonstrated an understanding of Carly instructions.   Carly [name] was advised to call back or seek an in-person evaluation if Carly symptoms worsen or if Carly condition fails to improve as anticipated.    Cameron Sprang, MD

## 2020-03-23 ENCOUNTER — Telehealth: Payer: Self-pay

## 2020-03-23 NOTE — Telephone Encounter (Signed)
Pt called informed paper work was at front desk

## 2020-05-10 DIAGNOSIS — Z0279 Encounter for issue of other medical certificate: Secondary | ICD-10-CM

## 2020-05-10 NOTE — Telephone Encounter (Signed)
Called and spoke to patient. Informed her that ppw work is ready and a signature is needed before we can fax. Pt verbalized understanding.

## 2020-05-25 ENCOUNTER — Other Ambulatory Visit: Payer: Self-pay

## 2020-05-25 ENCOUNTER — Ambulatory Visit (INDEPENDENT_AMBULATORY_CARE_PROVIDER_SITE_OTHER): Payer: 59 | Admitting: Psychology

## 2020-05-25 ENCOUNTER — Ambulatory Visit: Payer: 59 | Admitting: Psychology

## 2020-05-25 ENCOUNTER — Encounter: Payer: Self-pay | Admitting: Psychology

## 2020-05-25 DIAGNOSIS — I720 Aneurysm of carotid artery: Secondary | ICD-10-CM | POA: Diagnosis not present

## 2020-05-25 DIAGNOSIS — I609 Nontraumatic subarachnoid hemorrhage, unspecified: Secondary | ICD-10-CM

## 2020-05-25 DIAGNOSIS — R4189 Other symptoms and signs involving cognitive functions and awareness: Secondary | ICD-10-CM

## 2020-05-25 DIAGNOSIS — R569 Unspecified convulsions: Secondary | ICD-10-CM

## 2020-05-25 DIAGNOSIS — G3184 Mild cognitive impairment, so stated: Secondary | ICD-10-CM | POA: Diagnosis not present

## 2020-05-25 DIAGNOSIS — F0670 Mild neurocognitive disorder due to known physiological condition without behavioral disturbance: Secondary | ICD-10-CM

## 2020-05-25 HISTORY — DX: Mild cognitive impairment, so stated: G31.84

## 2020-05-25 HISTORY — DX: Mild neurocognitive disorder due to known physiological condition without behavioral disturbance: F06.70

## 2020-05-25 NOTE — Progress Notes (Signed)
NEUROPSYCHOLOGICAL EVALUATION [place] Department of Neurology  Date of Evaluation: May 25, 2020  Reason for Referral:   Carly [name] is a 49 y.o. right-handed African-American female referred by Ellouise Newer, M.D., to characterize her current cognitive functioning and assist with diagnostic clarity and treatment planning in the context of prior ruptured left ICA aneurysm, resultant seizure activity with status epilepticus, and subjective cognitive decline.   Assessment and Plan:   Clinical Impression(s): Carly [name] pattern of performance is suggestive of primary areas of weakness surrounding spontaneous delayed recall of previously learned information (i.e., retrieval aspects of memory) and fine motor coordination/speed. Performance variability was also exhibited across executive functioning (including working memory), visuospatial abilities, and recognition/consolidation aspects of memory. Performance was largely appropriate relative to premorbid intellectual estimations across processing speed, receptive and expressive language, and encoding (i.e., learning) aspects of memory. Carly [name] denied difficulties completing instrumental activities of daily living (ADLs) independently. As such, given evidence for cognitive dysfunction described above, she meets criteria for a Mild Neurocognitive Disorder (formerly "mild cognitive impairment") at the present time.  The results of cognitive testing are not particularly lateralizing nor localizing and the etiology of her weaknesses is likely multifactorial in nature. Infarcts or other events such as a ruptured aneurysm in the left internal carotid artery (ICA) can be mild due to collateral blood flow from the Circle of Willis. In cases where there is residual dysfunction, deficits in executive functioning, verbal fluency, and learning and memory can occur with varying severity. Prior neuroimaging suggesting small  chronic infarctsintheleft inferomedial frontal lobe, biparietal periventricular white matter, and splenium of corpus callosum could also affect similar domains of functioning, as well as processing speed and attention/concentration. 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 Some research has further suggested that the presence of siderosis may increase an individual's risk for the eventual development of a dementia clinical presentation. Her seizure activity is likely related to her brain injury and the presence of resultant encephalomalacia found on imaging. While seizure activity woud impact cognitive functioning in similar domains described above, seizure activity has been managed well since 2019. While this activity could certainly worsen cognitive deficits, it seems that the primary cause of dysfunction is related to her history of aneurysm and other cerebrovascular changes. Acute symptoms of moderate anxiety could also exacerbate cognitive weaknesses. Performance across cognitive testing is not concerning for the presence of an early-onset neurodegenerative illness. Continued medical monitoring will be important moving forward.  Recommendations: A repeat neuropsychological evaluation in 24 months (or sooner if functional decline is noted) is recommended to assess the trajectory of future cognitive decline should it occur. This will also aid in future efforts towards improved diagnostic clarity.  A combination of medication and psychotherapy has been shown to be most effective at treating symptoms of anxiety and depression. As such, Carly [name] is encouraged to speak with her prescribing physician regarding medication adjustments to optimally manage these symptoms. Likewise, Carly [name] could consider engaging in short-term psychotherapy to address  symptoms of psychiatric distress. She would benefit from an active and collaborative therapeutic environment, rather than one purely supportive in nature. Recommended treatment modalities include Cognitive Behavioral Therapy (CBT) or Acceptance and Commitment Therapy (ACT).  Carly [name] is encouraged to attend to lifestyle factors for brain health (e.g., regular physical exercise, good nutrition habits, regular participation in cognitively-stimulating activities, and general  stress management techniques), which are likely to have benefits for both emotional adjustment and cognition. In fact, in addition to promoting good general health, regular exercise incorporating aerobic activities (e.g., brisk walking, jogging, cycling, etc.) has been demonstrated to be a very effective treatment for depression and stress, with similar efficacy rates to both antidepressant medication and psychotherapy. Optimal control of vascular risk factors (including safe cardiovascular exercise and adherence to dietary recommendations) is encouraged.   If interested, there are some activities which have therapeutic value and can be useful in keeping her cognitively stimulated. For suggestions, Carly [name] is encouraged to go to the following website: https://www.[facility].org/get-to-know-barrow/centers-programs/neurorehabilitation-center/neuro-rehab-apps-and-games/ which has options, categorized by level of difficulty. It should be noted that these activities should not be viewed as a substitute for therapy.  For day-to-day problems recalling information, she may benefit from using strategies to aid with her learning and memory, such as asking questions for clarification, requesting that information to be repeated, or repeating an explanation in her own words to ensure comprehension and promote encoding.    Memory can be improved using internal strategies such as rehearsal, repetition, chunking, mnemonics, association, and imagery.  External strategies such as written notes in a consistently used memory journal, visual and nonverbal auditory cues such as a calendar on the refrigerator or appointments with alarm, such as on a cell phone, can also help maximize recall.    To address problems with fluctuating attention, she may wish to consider:   -Avoiding external distractions when needing to concentrate   -Limiting exposure to fast paced environments with multiple sensory demands   -Writing down complicated information and using checklists   -Attempting and completing one task at a time (i.e., no multi-tasking)   -Verbalizing aloud each step of a task to maintain focus   -Taking frequent breaks during the completion of steps/tasks to avoid fatigue   -Reducing the amount of information considered at one time  Review of Records:   Ms. Marceaux was seen by [facility] Marland KitchenEllouise Newer, M.D.) on 03/13/2020 for follow-up. Briefly, Ms. 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 She underwentelectiveaneurysmal treatment with flow diversion on 05/15/2018. Her hospitalization was complicated by status epilepticus.Back in 2018, she had a bad headache and episode of loss of consciousness with shaking at work and was found to have a subarachnoid hemorrhage secondary to ruptured aneurysm. She was discharged home on Keppra 1000mg  BID and denied any seizures or seizure-like activity until she was in the hospital for an elective procedure for enlarging aneurysm. She has no recollection of events post-op. She then started having right arm and face seizure-like activity followed by a GTC. She also had an episode of right gaze deviation with vocalization; no other motor activity was noted. She was globally aphasic. Brain MRI on 05/22/2018 showed asmall region of cortical T2 FLAIR hyperintensity centered within the left posterior  temporal lobe with few areas of reduced diffusion. Findings were said to probably represent seizure related activity although a component of subacute infarction was also said to be possible. No acute hemorrhage or mass effect was noted. Small chronic infarctswere seenintheleft inferomedial frontal lobe, biparietal periventricular white matter, and splenium of corpus callosum.There was also diffuse siderosis of the ventricular ependyma, brainstem, and cerebellar folia. She underwent long term EEG monitoring which showed epileptiform activity over the left posterior temporal region; partial status epilepticus arising from the left hemisphere was also seen. She was  intubated to achieve burst-suppression on Propofol. Seizure recurrence was seen with weaning Propofol and resolved with midazolam. She needed four AEDs (i.e., Dilantin, Keppra, Trileptal, and Vimpat). Dilantin was discontinued on hospital discharge and she was discharged home on Trileptal 750mg  BID,Vimpat 200mg  BID,and Keppra 1500mg  BID. Most recently, she has weaned off oxcarbazepine and lacosamide. She continues on Levetiracetam 1500mg  BID without any seizures.  She largely denied medication side effects. She also denied any recurrent seizures since her hospitalization. Her brother further denied any staring/unresponsive episodes or right-sided motor activity.She did have an episode on 9/22/2019where she passed out and reported left arm numbness after. She did not recall having any focal weakness, there was no warning, and she recalled it being very hot. No tongue bite or incontinence was reported.Head CT did not show any acute changes.She denied any gaps in time,olfactory/gustatory hallucinations, deja vu, rising epigastric sensation, or myoclonic jerks. She also denied dizziness, diplopia, dysarthria/dysphagia, neck/back pain, or bowel/bladder dysfunction. She did report some short-term memory concerns and diminished processing speed after  her SAH. Ultimately, Ms. Ercole was referred for a comprehensive neuropsychological evaluation to characterize her cognitive abilities and to assist with diagnostic clarity and treatment planning.   Past Medical History:  Diagnosis Date  . Acute encephalopathy   . Acute respiratory failure   . Adjustment disorder with depressed mood 08/30/2017  . AKI (acute kidney injury)   . 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 Benign essential hypertension 12/31/2012   She will get back on the metoprolol to keep her BP <130/<80.  She will start with 1/2 tab twice a day then go to 1 tab twice a day if needed.  Marland Kitchen GERD (gastroesophageal reflux disease)   . Hydrocephalus   . Hyperlipidemia 11/24/2018   Her LDL is much higher than it has been. Another question is whether or not her neurologist wants her to be on a statin and how low does she want her LDL to be ?? < 100; <70.  Marland Kitchen Hypoalbuminemia due to protein-calorie malnutrition   . Hypokalemia   . Hypoxemia   . Migraine headaches 06/16/2008  . PEG (percutaneous endoscopic gastrostomy) status   . Seizures    Related to ICA aneurysm; None reported since 2019  . SIRS (systemic inflammatory response syndrome)   . Status epilepticus    Related to ICA aneurysm  . Subarachnoid hemorrhage    Related to ruptured ICA aneurysm  . Syncope and collapse     Past Surgical History:  Procedure Laterality Date  . CESAREAN SECTION    . ESOPHAGOGASTRODUODENOSCOPY N/A 04/01/2017   Procedure: ESOPHAGOGASTRODUODENOSCOPY (EGD);  Surgeon: Georganna Skeans, MD;  Location: [place];  Service: General;  Laterality: N/A;  . IR ANGIO INTRA EXTRACRAN SEL INTERNAL CAROTID UNI L MOD SED  05/15/2018  . IR ANGIO INTRA EXTRACRAN SEL INTERNAL CAROTID UNI R MOD SED  03/03/2017  . IR ANGIO VERTEBRAL SEL SUBCLAVIAN INNOMINATE BILAT MOD SED  02/10/2018  . IR ANGIO VERTEBRAL SEL VERTEBRAL BILAT MOD SED  03/03/2017  . IR ANGIO VERTEBRAL SEL VERTEBRAL BILAT MOD SED   02/10/2018  . IR ANGIOGRAM FOLLOW UP STUDY  03/03/2017  . IR ANGIOGRAM FOLLOW UP STUDY  03/03/2017  . IR ANGIOGRAM FOLLOW UP STUDY  03/03/2017  . IR ANGIOGRAM FOLLOW UP STUDY  03/03/2017  . IR ANGIOGRAM FOLLOW UP STUDY  05/15/2018  . IR REPLC GASTRO/COLONIC TUBE PERCUT W/FLUORO  04/15/2017  . IR TRANSCATH/EMBOLIZ  03/03/2017  . IR TRANSCATH/EMBOLIZ  05/15/2018  .  IR US GUIDE VASC ACCESS RIGHT  05/15/2018  . LAPAROSCOPY     in early 90s  . PEG PLACEMENT N/A 04/01/2017   Procedure: PERCUTANEOUS ENDOSCOPIC GASTROSTOMY (PEG) PLACEMENT;  Surgeon: Georganna Skeans, MD;  Location: [place];  Service: General;  Laterality: N/A;  . RADIOLOGY WITH ANESTHESIA N/A 03/03/2017   Procedure: RADIOLOGY WITH ANESTHESIA;  Surgeon: Consuella Lose, MD;  Location: [place];  Service: Radiology;  Laterality: N/A;  . RADIOLOGY WITH ANESTHESIA N/A 05/15/2018   Procedure: Arteriogram, Embolization of recurrent aneurysm;  Surgeon: Consuella Lose, MD;  Location: [place];  Service: Radiology;  Laterality: N/A;    Current Outpatient Medications:  .  acetaminophen (TYLENOL) 325 MG tablet, Take 2 tablets (650 mg total) by mouth every 6 (six) hours as needed for fever., Disp: , Rfl:  .  aspirin EC 81 MG tablet, Take 81 mg by mouth daily. Swallow whole., Disp: , Rfl:  .  levETIRAcetam (KEPPRA) 750 MG tablet, Take 2 tablets (1,500 mg total) by mouth 2 (two) times daily., Disp: 120 tablet, Rfl: 11 .  metoprolol tartrate (LOPRESSOR) 50 MG tablet, , Disp: , Rfl:  .  Multiple Vitamin (MULTIVITAMIN WITH MINERALS) TABS tablet, Take 1 tablet by mouth daily. (Patient taking differently: Take 1 tablet by mouth daily. Women's Ultra [employer] Multivitamin), Disp: 30 tablet, Rfl: 0  Clinical Interview:   The following information was obtained during a clinical interview with Ms. Reddoch prior to cognitive testing.  Cognitive Symptoms: Decreased short-term memory: Endorsed. She reported generalized short-term memory dysfunction. Upon direct  questioning, she described trouble recalling upcoming appointments and the details of previous conversations, as well as more frequently losing her train of thought.  Decreased long-term memory: Denied. Decreased attention/concentration: Endorsed. She generally denied trouble with sustained attention, attributing this to her being far more purposeful when paying attention to others or the task at hand. However, she did describe increased distractibility.  Reduced processing speed: Endorsed. Difficulties with executive functions: Endorsed. She reported some trouble with organization, largely attributed to getting distracted or forgetting what step she was at when completing a multi-step task or project. She reported some indecisiveness attributed to her feeling less confident in her decision making process. She denied trouble with impulsivity or poor judgment. She largely denied significant personality changes. She did acknowledge that her husband has commented that she seems more easily irritated or angered.  Difficulties with emotion regulation: Denied. Difficulties with receptive language: Endorsed. However, deficits were attributed to diminished processing speed rather than frank comprehension deficits.  Difficulties with word finding: Endorsed. Decreased visuoperceptual ability: Denied.  Trajectory of deficits: Cognitive deficits were first said to be noticeable following her initial aneurysm rupture in June 2018. However, they were exacerbated following her 2019 surgical procedure and have appeared more pronounced since that time. She was unsure if they have exhibited decline since then.   Difficulties completing ADLs: Denied.  Additional Medical History: History of traumatic brain injury/concussion: Denied outside of what is described above.  History of stroke: Prior neuroimaging (brain MRI) on 05/22/2018 suggested the presence of small chronic infarctsintheleft inferomedial frontal lobe,  biparietal periventricular white matter, and splenium of corpus callosum.  History of seizure activity: Endorsed (see above). Ms. Saefong confirmed that she has not experienced any seizure activity to her knowledge since 2019.  History of known exposure to toxins: Denied. Symptoms of chronic pain: Denied. Experience of frequent headaches/migraines: Endorsed. She reported fairly significant headache symptoms occurring 2-3 times per month. These are generally treated well with OTC  medications such as Tylenol.  Frequent instances of dizziness/vertigo: Denied.  Sensory changes: She wears glasses with positive effect and has done so since prior to her aneurysm. Hearing difficulties were largely denied. However, she did report asking others to "say that again" or "speak up" more frequently over the past 2-3 months. Other sensory changes/difficulties (e.g., taste or smell) were denied.  Balance/coordination difficulties: She described her balance as "for the most part, good." She did note that she will lean against countertops or other things in her environment as a precautionary measure. She denied a history of falls.  Other motor difficulties: She noted some tremulous behaviors when holding heavier objects. She also noted that the muscles in her right hand tense and become more easily fatigued than on her left side. She also noted that tremors were an accompanying side effect of a previous AED medication (she thought this was Vimpat but was unsure).   Sleep History: Estimated hours obtained each night: 3-4 hours at any one point in time.  Difficulties falling asleep: Unsure. She reported generally using the television to help her fall asleep.  Difficulties staying asleep: Endorsed. She described her sleep as broken in nature. Some of this was attributed to her husband's work schedule as he will awake and go to work around 2-3:00am. She will occasionally have trouble falling back asleep after being awoken.    Feels rested and refreshed upon awakening: Endorsed.  History of snoring: Denied. History of waking up gasping for air: Denied. Witnessed breath cessation while asleep: Denied.  History of vivid dreaming: Denied. Excessive movement while asleep: Denied. Instances of acting out her dreams: Denied.  Psychiatric/Behavioral Health History: Depression: She described her current mood as "kind of down" lately. This was attributed both to her mother being placed in a nursing home due to evolving dementia symptoms, as well as her daughter going off to college. She acknowledged a period of depression following the passing of her brother in 2008. Medical records also suggest a history of an adjustment disorder with depressed mood following her aneurysm. She denied previously working with a therapist or medication intervention in the past. Current or remote suicidal ideation, intent, or plan was denied.  Anxiety: Denied. Mania: Denied. Trauma History: Denied. Visual/auditory hallucinations: Denied. Delusional thoughts: Denied.  Tobacco: Denied. Alcohol: She denied current alcohol consumption as well as a history of problematic alcohol abuse or dependence.  Recreational drugs: Denied. Caffeine: Denied.   Family History: Problem Relation Age of Onset  . Diabetes Mother   . Hypertension Mother   . Hyperlipidemia Mother   . Dementia Mother   . Cancer Father        colon cancer  . Diabetes Maternal Grandmother   . Hyperlipidemia Paternal Grandfather   . Seizures Cousin    This information was confirmed by Ms. Phillip Heal.  Academic/Vocational History: Highest level of educational attainment: 13 years. She reported graduating from high school and completing one additional year of college. She described herself as an average (B/C) student in academic settings. Math was noted as a likely relative weakness.  History of developmental delay: Denied. History of grade repetition: Denied. Enrollment in  special education courses: Denied. History of LD/ADHD: Denied.  Employment: She has been unemployed since her initial ICA aneurysm and SAH in June 2018. 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   Evaluation Results:   Behavioral Observations: Ms. Granberry was unaccompanied, arrived to her appointment on time, and was appropriately dressed and groomed. She  appeared alert and oriented. Observed gait and station were within normal limits. Gross motor functioning appeared intact upon informal observation and no abnormal movements (e.g., tremors) were noted. Her affect was generally relaxed and positive, but did range appropriately given the subject being discussed during the clinical interview or the task at hand during testing procedures. Spontaneous speech was fluent and word finding difficulties were not observed during the clinical interview. Thought processes were coherent, organized, and normal in content. Insight into her cognitive difficulties appeared adequate. During testing, sustained attention was appropriate. Task engagement was adequate and she persisted when challenged. Overall, Ms. Kowalchuk was cooperative with the clinical interview and subsequent testing procedures.   Adequacy of Effort: The validity of neuropsychological testing is limited by the extent to which the individual being tested may be assumed to have exerted adequate effort during testing. Ms. Colquitt expressed her intention to perform to the best of her abilities and exhibited adequate task engagement and persistence. Scores across stand-alone and embedded performance validity measures were within expectation. As such, the results of the current evaluation are believed to be a valid representation of Ms. Benning current cognitive functioning.  Test Results: Ms. Hemm was fully oriented at the time of the current evaluation.  Intellectual abilities based upon educational and vocational attainment were  estimated to be in the average range. Premorbid abilities were estimated to be within the below average range based upon a single-word reading test.   Processing speed was below average to average. Basic attention was below average. More complex attention (e.g., working memory) was well below average. Executive functioning was variable, ranging from the exceptionally low to average normative ranges.  Assessed receptive language abilities were below average. Ms. Heckstall did not appear to exhibit any difficulties comprehending task instructions and answered all questions asked of her appropriately. Assessed expressive language (e.g., verbal fluency and confrontation naming) was below average to average.     Assessed visuospatial/visuoconstructional abilities were mildly variable, ranging from the well below average to average normative ranges.    Learning (i.e., encoding) of novel verbal and visual information was below average. Spontaneous delayed recall (i.e., retrieval) of previously learned information was diminished, with scores generally in the well below average range. Retention rates were 74% across a story learning task, 83% across a list learning task, and 43% across a shape learning task. Performance across recognition tasks was variable, ranging from the exceptionally low to average normative ranges, suggesting some evidence for information consolidation.  Fine motor coordination and speed was exceptionally low bilaterally.    Results of emotional screening instruments suggested that recent symptoms of generalized anxiety were in the moderate range, while symptoms of depression were within normal limits. A screening instrument assessing recent sleep quality suggested the presence of minimal sleep dysfunction.  Tables of Scores:   Note: This summary of test scores accompanies the interpretive report and should not be considered in isolation without reference to the appropriate sections in the  text. Descriptors are based on appropriate normative data and may be adjusted based on clinical judgment. The terms "impaired" and "within normal limits (WNL)" are used when a more specific level of functioning cannot be determined.       Effort Testing:   DESCRIPTOR       ACS Word Choice: --- --- Within Expectation  NAB EVI: --- --- Within Expectation  D-KEFS Color Word Effort Index: --- --- Within Expectation       Orientation:      Raw Score  Percentile   NAB Orientation, Form 1 29/29 --- ---       Cognitive Screening:           Raw Score Percentile   SLUMS: 18/30 --- ---       Intellectual Functioning:           Standard Score Percentile   Test of Premorbid Functioning: 84 14 Below Average       Memory:          NAB Memory Module, Form 2: Standard Score/ T Score Percentile   List Learning       Total Trials 1-3 19/36 (37) 9 Below Average    List B 3/12 (38) 12 Below Average    Short Delay Free Recall 6/12 (39) 14 Below Average    Long Delay Free Recall 5/12 (36) 8 Well Below Average    Retention Percentage 83 (42) 21 Below Average    Recognition Discriminability 9 (49) 46 Average  Shape Learning       Total Trials 1-3 14/27 (42) 21 Below Average    Delayed Recall 3/9 (31) 3 Well Below Average    Retention Percentage 43 (32) 4 Well Below Average    Recognition Discriminability 1 (10) <1 Exceptionally Low  Story Learning       Immediate Recall 37/80 (37) 9 Below Average    Delayed Recall 17/40 (36) 8 Well Below Average    Retention Percentage 74 (38) 12 Below Average  Daily Living Memory       Immediate Recall 38/51 (37) 9 Below Average    Delayed Recall 14/17 (45) 31 Average    Retention Percentage 93 (52) 58 Average    Recognition Hits 7/10 (35) 7 Well Below Average       Attention/Executive Function:          Trail Making Test (TMT): Raw Score (T Score) Percentile     Part A 30 secs.,  0 errors (51) 54 Average    Part B 70 secs.,  1 error (55) 69 Average          Scaled Score Percentile   WAIS-IV Coding: 7 16 Below Average       NAB Attention Module, Form 1: T Score Percentile     Digits Forward 42 21 Below Average    Digits Backwards 36 8 Well Below Average       D-KEFS Color-Word Interference Test: Raw Score (Scaled Score) Percentile     Color Naming 32 secs. (9) 37 Average    Word Reading 24 secs. (9) 37 Average    Inhibition 52 secs. (12) 75 Above Average      Total Errors 6 errors (3) 1 Exceptionally Low    Inhibition/Switching 75 secs. (9) 37 Average      Total Errors 7 errors (5) 5 Well Below Average       D-KEFS Verbal Fluency Test: Raw Score (Scaled Score) Percentile     Letter Total Correct 35 (9) 37 Average    Category Total Correct 36 (9) 37 Average    Category Switching Total Correct 11 (8) 25 Average    Category Switching Accuracy 9 (7) 16 Below Average      Total Set Loss Errors 0 (13) 84 Above Average      Total Repetition Errors 6 (7) 16 Below Average       D-KEFS 20 Questions Test: Scaled Score Percentile     Total Weighted Achievement Score 8 25 Average    Initial  Abstraction Score 5 5 Well Below Average       [place] Card Sorting Test: Raw Score Percentile     Categories (trials) 1 (64) 6-10 Well Below Average    Total Errors 26 9 Below Average    Perseverative Errors 15 14 Below Average    Non-Perseverative Errors 11 12 Below Average    Failure to Maintain Set 2 --- ---       Language:          Verbal Fluency Test: Raw Score (T Score) Percentile     Phonemic Fluency (FAS) 35 (48) 42 Average    Animal Fluency 14 (42) 21 Below Average        NAB Language Module, Form 1: T Score Percentile     Auditory Comprehension 40 16 Below Average    Naming 29/31 (42) 21 Below Average       Visuospatial/Visuoconstruction:      Raw Score Percentile   Clock Drawing: 8/10 --- Within Normal Limits       NAB Spatial Module, Form 1: T Score Percentile     Figure Drawing Copy 43 25 Average    Figure Drawing  Immediate Recall 32 4 Well Below Average        Scaled Score Percentile   WAIS-IV Block Design: 7 16 Below Average  WAIS-IV Matrix Reasoning: 5 5 Well Below Average       Sensory-Motor:          [place] Grooved Pegboard Test: Raw Score Percentile     Dominant Hand 177 secs.,  0 drops  <1 Exceptionally Low    Non-Dominant Hand 184 secs.,  0 drops  <1 Exceptionally Low       Mood and Personality:      Raw Score Percentile   Beck Depression Inventory - II: 13 --- Within Normal Limits  PROMIS Anxiety Questionnaire: 23 --- Moderate       Additional Questionnaires:      Raw Score Percentile   PROMIS Sleep Disturbance Questionnaire: 23 --- None to Slight   Informed Consent and Coding/Compliance:   Ms. Mines was provided with a verbal description of the nature and purpose of the present neuropsychological evaluation. Also reviewed were the foreseeable risks and/or discomforts and benefits of the procedure, limits of confidentiality, and mandatory reporting requirements of this provider. The patient was given the opportunity to ask questions and receive answers about the evaluation. Oral consent to participate was provided by the patient.   This evaluation was conducted by Christia Reading, Ph.D., licensed clinical neuropsychologist. Ms. Baumgarner completed a comprehensive clinical interview with Dr. Melvyn Novas, billed as one unit [phone], and 150 minutes of cognitive testing and scoring, billed as one unit [phone] and four additional units 96139. Psychometrist Milana Kidney, B.S., assisted Dr. Melvyn Novas with test administration and scoring procedures. As a separate and discrete service, Dr. Melvyn Novas spent a total of 160 minutes in interpretation and report writing billed as one unit [phone] and two units 96133.

## 2020-05-25 NOTE — Progress Notes (Signed)
   Psychometrician Note   Cognitive testing was administered to Carly [name] by Milana Kidney, B.S. (psychometrist) under the supervision of Dr. Christia Reading, Ph.D., licensed psychologist on 150. Ms. Egge did not appear overtly distressed by the testing session per behavioral observation or responses across self-report questionnaires. Dr. Christia Reading, Ph.D. checked in with Ms. Griebel as needed to manage any distress related to testing procedures (if applicable). Rest breaks were offered.    The battery of tests administered was selected by Dr. Christia Reading, Ph.D. with consideration to Ms. Harnois's current level of functioning, the nature of her symptoms, emotional and behavioral responses during interview, level of literacy, observed level of motivation/effort, and the nature of the referral question. This battery was communicated to the psychometrist. Communication between Dr. Christia Reading, Ph.D. and the psychometrist was ongoing throughout the evaluation and Dr. Christia Reading, Ph.D. was immediately accessible at all times. Dr. Christia Reading, Ph.D. provided supervision to the psychometrist on the date of this service to the extent necessary to assure the quality of all services provided.    KENNA SEWARD will return within approximately 1-2 weeks for an interactive feedback session with Dr. Melvyn Novas at which time her test performances, clinical impressions, and treatment recommendations will be reviewed in detail. Ms. Benegas understands she can contact our office should she require our assistance before this time.  A total of 150 minutes of billable time were spent face-to-face with Ms. Scharfenberg by the psychometrist. This includes both test administration and scoring time. Billing for these services is reflected in the clinical report generated by Dr. Christia Reading, Ph.D..  This note reflects time spent with the psychometrician and does not include test scores or any clinical  interpretations made by Dr. Melvyn Novas. The full report will follow in a separate note.

## 2020-06-01 ENCOUNTER — Other Ambulatory Visit: Payer: Self-pay

## 2020-06-01 ENCOUNTER — Ambulatory Visit (INDEPENDENT_AMBULATORY_CARE_PROVIDER_SITE_OTHER): Payer: 59 | Admitting: Psychology

## 2020-06-01 DIAGNOSIS — G3184 Mild cognitive impairment, so stated: Secondary | ICD-10-CM

## 2020-06-01 DIAGNOSIS — I609 Nontraumatic subarachnoid hemorrhage, unspecified: Secondary | ICD-10-CM | POA: Diagnosis not present

## 2020-06-01 DIAGNOSIS — I720 Aneurysm of carotid artery: Secondary | ICD-10-CM | POA: Diagnosis not present

## 2020-06-01 DIAGNOSIS — F0670 Mild neurocognitive disorder due to known physiological condition without behavioral disturbance: Secondary | ICD-10-CM

## 2020-06-01 NOTE — Patient Instructions (Signed)
Recommendations: A repeat neuropsychological evaluation in 24 months (or sooner if functional decline is noted) is recommended to assess the trajectory of future cognitive decline should it occur. This will also aid in future efforts towards improved diagnostic clarity.  A combination of medication and psychotherapy has been shown to be most effective at treating symptoms of anxiety and depression. As such, Carly [name] is encouraged to speak with her prescribing physician regarding medication adjustments to optimally manage these symptoms. Likewise, Carly [name] could consider engaging in short-term psychotherapy to address symptoms of psychiatric distress. She would benefit from an active and collaborative therapeutic environment, rather than one purely supportive in nature. Recommended treatment modalities include Cognitive Behavioral Therapy (CBT) or Acceptance and Commitment Therapy (ACT).  Carly [name] is encouraged to attend to lifestyle factors for brain health (e.g., regular physical exercise, good nutrition habits, regular participation in cognitively-stimulating activities, and general stress management techniques), which are likely to have benefits for both emotional adjustment and cognition. In fact, in addition to promoting good general health, regular exercise incorporating aerobic activities (e.g., brisk walking, jogging, cycling, etc.) has been demonstrated to be a very effective treatment for depression and stress, with similar efficacy rates to both antidepressant medication and psychotherapy. Optimal control of vascular risk factors (including safe cardiovascular exercise and adherence to dietary recommendations) is encouraged.   If interested, there are some activities which have therapeutic value and can be useful in keeping her cognitively stimulated. For suggestions, Carly [name] is encouraged to go to the following website:  https://www.[facility].org/get-to-know-barrow/centers-programs/neurorehabilitation-center/neuro-rehab-apps-and-games/ which has options, categorized by level of difficulty. It should be noted that these activities should not be viewed as a substitute for therapy.  For day-to-day problems recalling information, she may benefit from using strategies to aid with her learning and memory, such as asking questions for clarification, requesting that information to be repeated, or repeating an explanation in her own words to ensure comprehension and promote encoding.    Memory can be improved using internal strategies such as rehearsal, repetition, chunking, mnemonics, association, and imagery. External strategies such as written notes in a consistently used memory journal, visual and nonverbal auditory cues such as a calendar on the refrigerator or appointments with alarm, such as on a cell phone, can also help maximize recall.    To address problems with fluctuating attention, she may wish to consider:   -Avoiding external distractions when needing to concentrate   -Limiting exposure to fast paced environments with multiple sensory demands   -Writing down complicated information and using checklists   -Attempting and completing one task at a time (i.e., no multi-tasking)   -Verbalizing aloud each step of a task to maintain focus   -Taking frequent breaks during the completion of steps/tasks to avoid fatigue   -Reducing the amount of information considered at one time

## 2020-06-01 NOTE — Progress Notes (Signed)
   Neuropsychology Feedback Session Carly [name]. [facility]  Reason for Referral:   Carly [name] a 49 y.o. right-handed African-American female referred by Carly [name], M.D.,to characterize hercurrent cognitive functioning and assist with diagnostic clarity and treatment planning in the context of prior ruptured left ICA aneurysm, resultant seizure activity with status epilepticus, and subjective cognitive decline.   Feedback:   Carly [name] completed a comprehensive neuropsychological evaluation on 05/25/2020. Please refer to that encounter for the full report and recommendations. Briefly, results suggested primary areas of weakness surrounding spontaneous delayed recall of previously learned information (i.e., retrieval aspects of memory) and fine motor coordination/speed. Performance variability was also exhibited across executive functioning (including working memory), visuospatial abilities, and recognition/consolidation aspects of memory. The results of cognitive testing are not particularly lateralizing nor localizing and the etiology of her weaknesses is likely multifactorial in nature. Infarcts or other events such as a ruptured aneurysm in the left internal carotid artery (ICA) can be mild due to collateral blood flow from the Circle of Willis. In cases where there is residual dysfunction, deficits in executive functioning, verbal fluency, and learning and memory can occur with varying severity. Prior neuroimaging suggesting small chronic infarctsintheleft inferomedial frontal lobe, biparietal periventricular white matter, and splenium of corpus callosum could also affect similar domains of functioning, as well as processing speed and attention/concentration. 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  Carly [name] was accompanied by her husband during the current feedback session. Content of the current session focused on the results of her neuropsychological evaluation. Carly [name] and her husband were given the opportunity to ask questions and their questions were answered. They were encouraged to reach out should additional questions arise. A copy of her report was provided at the conclusion of the visit.      25 minutes were spent conducting the current feedback session with Carly [name], billed as one unit [phone].

## 2020-10-31 ENCOUNTER — Other Ambulatory Visit: Payer: Self-pay

## 2020-10-31 ENCOUNTER — Ambulatory Visit (INDEPENDENT_AMBULATORY_CARE_PROVIDER_SITE_OTHER): Payer: 59 | Admitting: Ophthalmology

## 2020-10-31 ENCOUNTER — Encounter (INDEPENDENT_AMBULATORY_CARE_PROVIDER_SITE_OTHER): Payer: Self-pay | Admitting: Ophthalmology

## 2020-10-31 DIAGNOSIS — H469 Unspecified optic neuritis: Secondary | ICD-10-CM

## 2020-10-31 DIAGNOSIS — H35371 Puckering of macula, right eye: Secondary | ICD-10-CM

## 2020-10-31 DIAGNOSIS — H2511 Age-related nuclear cataract, right eye: Secondary | ICD-10-CM

## 2020-10-31 DIAGNOSIS — H4313 Vitreous hemorrhage, bilateral: Secondary | ICD-10-CM

## 2020-10-31 DIAGNOSIS — I608 Other nontraumatic subarachnoid hemorrhage: Secondary | ICD-10-CM

## 2020-10-31 DIAGNOSIS — H35352 Cystoid macular degeneration, left eye: Secondary | ICD-10-CM

## 2020-10-31 DIAGNOSIS — Z9889 Other specified postprocedural states: Secondary | ICD-10-CM

## 2020-10-31 DIAGNOSIS — H2512 Age-related nuclear cataract, left eye: Secondary | ICD-10-CM

## 2020-10-31 HISTORY — DX: Cystoid macular degeneration, left eye: H35.352

## 2020-10-31 HISTORY — DX: Age-related nuclear cataract, right eye: H25.11

## 2020-10-31 HISTORY — DX: Other specified postprocedural states: Z98.890

## 2020-10-31 HISTORY — DX: Vitreous hemorrhage, bilateral: H43.13

## 2020-10-31 HISTORY — DX: Age-related nuclear cataract, left eye: H25.12

## 2020-10-31 HISTORY — DX: Unspecified optic neuritis: H46.9

## 2020-10-31 HISTORY — DX: Puckering of macula, right eye: H35.371

## 2020-10-31 NOTE — Assessment & Plan Note (Signed)
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  This typically leads to a significant myopic shift in the prescription for an eye such as this.  Alleviation of this should it become more opaque or should anisometropia develop would only be via cataract surgery with intraocular lens placement  Follow-up with Dr. Luz Brazen for monitoring as scheduled

## 2020-10-31 NOTE — Assessment & Plan Note (Addendum)
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

## 2020-10-31 NOTE — Patient Instructions (Signed)
Patient to report new onset visual acuity distortions or declines.  I explained to the patient should things become seemingly dark with the vision in the left eye it is because the cataract in that eye (post vitrectomy) is becoming darker and is likely to do so much quicker than the right eye.

## 2020-10-31 NOTE — Assessment & Plan Note (Signed)
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

## 2020-10-31 NOTE — Progress Notes (Signed)
10/31/2020     CHIEF COMPLAINT Patient presents for Retina Follow Up (1 Year f\u OU. OCT/Pt states no major changes in vision. Recently got a new gls rx from Dr. Sharen Counter. Pt still seeing her normal floaters.)   HISTORY OF PRESENT ILLNESS: Carly [name] is a 50 y.o. female who presents to the clinic today for:   HPI    Retina Follow Up    Diagnosis: CME.  In left eye.  Severity is moderate.  Duration of 1 year.  Since onset it is stable.  I, the attending physician,  performed the HPI with the patient and updated documentation appropriately. Additional comments: 1 Year f\u OU. OCT Pt states no major changes in vision. Recently got a new gls rx from Dr. Sharen Counter. Pt still seeing her normal floaters.       Last edited by Tilda Franco on 10/31/2020  1:33 PM. (History)      Referring physician: [name], MD [address] [place],  [place] [address]  HISTORICAL INFORMATION:   Selected notes from the MEDICAL RECORD NUMBER    Lab Results  Component Value Date   HGBA1C 5.9 (H) 07/23/2013     CURRENT MEDICATIONS: No current outpatient medications on file. (Ophthalmic Drugs)   No current facility-administered medications for this visit. (Ophthalmic Drugs)   Current Outpatient Medications (Other)  Medication Sig  . acetaminophen (TYLENOL) 325 MG tablet Take 2 tablets (650 mg total) by mouth every 6 (six) hours as needed for fever.  Marland Kitchen aspirin EC 81 MG tablet Take 81 mg by mouth daily. Swallow whole.  . levETIRAcetam (KEPPRA) 750 MG tablet Take 2 tablets (1,500 mg total) by mouth 2 (two) times daily.  . metoprolol tartrate (LOPRESSOR) 50 MG tablet   . Multiple Vitamin (MULTIVITAMIN WITH MINERALS) TABS tablet Take 1 tablet by mouth daily. (Patient taking differently: Take 1 tablet by mouth daily. Women's Ultra [employer] Multivitamin)   No current facility-administered medications for this visit. (Other)      REVIEW OF SYSTEMS:    ALLERGIES No Known  Allergies  PAST MEDICAL HISTORY Past Medical History:  Diagnosis Date  . Acute encephalopathy   . Acute respiratory failure   . Adjustment disorder with depressed mood 08/30/2017  . AKI (acute kidney injury)   . 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 Benign essential hypertension 12/31/2012   She will get back on the metoprolol to keep her BP <130/<80.  She will start with 1/2 tab twice a day then go to 1 tab twice a day if needed.  Marland Kitchen GERD (gastroesophageal reflux disease)   . Hydrocephalus   . Hyperlipidemia 11/24/2018   Her LDL is much higher than it has been. Another question is whether or not her neurologist wants her to be on a statin and how low does she want her LDL to be ?? < 100; <70.  Marland Kitchen Hypoalbuminemia due to protein-calorie malnutrition   . Hypokalemia   . Hypoxemia   . Migraine headaches 06/16/2008  . 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 PEG (percutaneous endoscopic gastrostomy) status   . Seizures    Related to ICA aneurysm; None reported since 2019  . SIRS (systemic inflammatory response syndrome)   . Status epilepticus    Related to ICA aneurysm  . Subarachnoid hemorrhage    Related to ruptured ICA aneurysm  . Syncope and collapse    Past Surgical History:  Procedure Laterality Date  .  CESAREAN SECTION    . ESOPHAGOGASTRODUODENOSCOPY N/A 04/01/2017   Procedure: ESOPHAGOGASTRODUODENOSCOPY (EGD);  Surgeon: Georganna Skeans, MD;  Location: [facility];  Service: General;  Laterality: N/A;  . IR ANGIO INTRA EXTRACRAN SEL INTERNAL CAROTID UNI L MOD SED  05/15/2018  . IR ANGIO INTRA EXTRACRAN SEL INTERNAL CAROTID UNI R MOD SED  03/03/2017  . IR ANGIO VERTEBRAL SEL SUBCLAVIAN INNOMINATE BILAT MOD SED  02/10/2018  . IR ANGIO VERTEBRAL SEL VERTEBRAL BILAT MOD SED  03/03/2017  . IR ANGIO VERTEBRAL SEL VERTEBRAL BILAT MOD SED  02/10/2018  . IR ANGIOGRAM FOLLOW UP STUDY  03/03/2017  . IR ANGIOGRAM FOLLOW UP STUDY  03/03/2017  . IR  ANGIOGRAM FOLLOW UP STUDY  03/03/2017  . IR ANGIOGRAM FOLLOW UP STUDY  03/03/2017  . IR ANGIOGRAM FOLLOW UP STUDY  05/15/2018  . IR REPLC GASTRO/COLONIC TUBE PERCUT W/FLUORO  04/15/2017  . IR TRANSCATH/EMBOLIZ  03/03/2017  . IR TRANSCATH/EMBOLIZ  05/15/2018  . IR US GUIDE VASC ACCESS RIGHT  05/15/2018  . LAPAROSCOPY     in early 90s  . PEG PLACEMENT N/A 04/01/2017   Procedure: PERCUTANEOUS ENDOSCOPIC GASTROSTOMY (PEG) PLACEMENT;  Surgeon: Georganna Skeans, MD;  Location: [facility];  Service: General;  Laterality: N/A;  . RADIOLOGY WITH ANESTHESIA N/A 03/03/2017   Procedure: RADIOLOGY WITH ANESTHESIA;  Surgeon: Consuella Lose, MD;  Location: [facility];  Service: Radiology;  Laterality: N/A;  . RADIOLOGY WITH ANESTHESIA N/A 05/15/2018   Procedure: Arteriogram, Embolization of recurrent aneurysm;  Surgeon: Consuella Lose, MD;  Location: [facility];  Service: Radiology;  Laterality: N/A;    FAMILY HISTORY Family History  Problem Relation Age of Onset  . Diabetes Mother   . Hypertension Mother   . Hyperlipidemia Mother   . Dementia Mother   . Cancer Father        colon cancer  . Diabetes Maternal Grandmother   . Hyperlipidemia Paternal Grandfather   . Seizures Cousin     SOCIAL HISTORY Social History   Tobacco Use  . Smoking status: Never Smoker  . Smokeless tobacco: Never Used  Vaping Use  . Vaping Use: Never used  Substance Use Topics  . Alcohol use: Not Currently    Comment: occ  . Drug use: No         OPHTHALMIC EXAM:  Base Eye Exam    Visual Acuity (Snellen - Linear)      Right Left   Dist [place] 20/30 -2 20/200   Dist ph [place]  20/30 +2       Tonometry (Tonopen, 1:39 PM)      Right Left   Pressure 13 15       Pupils      Pupils Dark Light Shape React APD   Right PERRL 5 4 Round Brisk None   Left PERRL 5 4 Round Brisk None       Visual Fields (Counting fingers)      Left Right    Full        Neuro/Psych    Oriented x3: Yes   Mood/Affect: Normal        Dilation    Both eyes: 1.0% Mydriacyl, 2.5% Phenylephrine @ 1:39 PM          IMAGING AND PROCEDURES  Imaging and Procedures for 10/31/20  OCT, Retina - OU - Both Eyes       Right Eye Quality was good. Scan locations included subfoveal. Central Foveal Thickness: 291. Progression has been stable. Findings include epiretinal membrane,  normal foveal contour.   Left Eye Quality was good. Scan locations included subfoveal. Central Foveal Thickness: 353. Progression has improved. Findings include abnormal foveal contour.   Notes OS with vastly improved macular thickening post vitrectomy membrane peel, residual inner cystoid change not active CME  OD with minor epiretinal membrane and normal fovea                ASSESSMENT/PLAN:  Nuclear sclerotic cataract of left eye OS has a typical post vitrectomy cataract whereby the central nucleus takes on a dense green slightly opaque appearance surrounded by otherwise perfectly clear cortex.  This typically leads to a significant myopic shift in the prescription for an eye such as this.  Alleviation of this should it become more opaque or should anisometropia develop would only be via cataract surgery with intraocular lens placement  Follow-up with Dr. 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 Optic neuropathy, left  H46.9   2. Cystoid macular edema of left eye  H35.352 OCT, Retina - OU - Both Eyes  3. Macular pucker, right eye  H35.371 OCT, Retina - OU - Both Eyes  4. Nuclear sclerotic cataract of right eye  H25.11   5. Nuclear sclerotic cataract of left eye  H25.12   6. Optic neuropathy, right  H46.9   7. 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 History of vitrectomy  Z98.890     1.  2.  3.  Ophthalmic Meds Ordered this visit:  No orders of the defined types were placed in this encounter.      Return in about 2 years (around 10/31/2022) for DILATE OU, OCT.  Patient Instructions  Patient to report new onset visual acuity distortions or declines.  I explained to the patient should things become seemingly dark with the vision in the left eye it is because the cataract in that eye (post vitrectomy) is becoming darker and is likely to do so much quicker than the right eye.    Explained the diagnoses, plan, and follow up with the patient and they expressed understanding.  Patient expressed understanding of the importance of proper follow up care.   Clent Demark Estefan Pattison M.D. Diseases & Surgery of the Retina and Vitreous [facility] 10/31/20     Abbreviations: M myopia (nearsighted); A astigmatism; H hyperopia (farsighted); P presbyopia; Mrx spectacle prescription;  CTL contact lenses; OD right eye; OS left eye; OU both eyes  XT exotropia; ET esotropia; PEK punctate epithelial keratitis; PEE punctate epithelial erosions; DES dry eye syndrome; MGD meibomian gland dysfunction; ATs artificial tears; PFAT's preservative free artificial tears; [place] nuclear sclerotic cataract; PSC posterior subcapsular cataract; ERM epi-retinal membrane; PVD posterior vitreous detachment; RD retinal detachment; DM diabetes mellitus; DR diabetic retinopathy; NPDR non-proliferative diabetic retinopathy; PDR  proliferative diabetic retinopathy;  CSME clinically significant macular edema; DME diabetic macular edema; dbh dot blot hemorrhages; CWS cotton wool spot; POAG primary open angle glaucoma; C/D cup-to-disc ratio; HVF humphrey visual field; GVF goldmann visual field; OCT optical coherence tomography; IOP intraocular pressure; BRVO Branch retinal vein occlusion; CRVO central retinal vein occlusion; CRAO central retinal artery occlusion; BRAO branch retinal artery occlusion; RT retinal tear; SB scleral buckle; PPV pars plana vitrectomy; VH Vitreous hemorrhage; PRP panretinal laser photocoagulation; IVK intravitreal kenalog; VMT vitreomacular traction; MH Macular hole;  NVD neovascularization of the disc; NVE neovascularization elsewhere; AREDS age related eye disease study; ARMD age related macular degeneration; POAG primary open angle glaucoma; EBMD epithelial/anterior basement membrane dystrophy; ACIOL anterior chamber intraocular lens; IOL intraocular lens; PCIOL posterior chamber intraocular lens; Phaco/IOL phacoemulsification with intraocular lens placement; [place] photorefractive keratectomy; LASIK laser assisted in situ keratomileusis; HTN hypertension; DM diabetes mellitus; COPD chronic obstructive pulmonary disease

## 2020-10-31 NOTE — Assessment & Plan Note (Signed)
Minor intraretinal cystoid space singular, much smaller area of involvement as compared to prior to macular pucker repair from 2018

## 2020-10-31 NOTE — Assessment & Plan Note (Signed)
Minor, none foveal distorting nasal to the fovea OD, observe

## 2020-11-01 ENCOUNTER — Encounter (INDEPENDENT_AMBULATORY_CARE_PROVIDER_SITE_OTHER): Payer: 59 | Admitting: Ophthalmology

## 2020-11-03 ENCOUNTER — Ambulatory Visit (AMBULATORY_SURGERY_CENTER): Payer: Self-pay | Admitting: *Deleted

## 2020-11-03 ENCOUNTER — Other Ambulatory Visit: Payer: Self-pay

## 2020-11-03 VITALS — Ht 63.0 in | Wt 210.0 lb

## 2020-11-03 DIAGNOSIS — Z1211 Encounter for screening for malignant neoplasm of colon: Secondary | ICD-10-CM

## 2020-11-03 NOTE — Progress Notes (Signed)
No egg or soy allergy known to patient  No issues with past sedation with any surgeries or procedures No intubation problems in the past  No FH of Malignant Hyperthermia No diet pills per patient No home 02 use per patient  No blood thinners per patient  Pt denies issues with constipation  No A fib or A flutter  EMMI video to pt or via [place] 19 guidelines implemented in [place] today with Pt and RN  Pt is fully vaccinated  for Covid  Pt denies loose or missing teeth, denies dentures, partials, dental implants, capped or bonded teeth    Due to the COVID-19 pandemic we are asking patients to follow certain guidelines.  Pt aware of COVID protocols and [facility] guidelines

## 2020-11-21 DIAGNOSIS — Z0279 Encounter for issue of other medical certificate: Secondary | ICD-10-CM

## 2020-11-22 ENCOUNTER — Telehealth: Payer: Self-pay | Admitting: Neurology

## 2020-11-28 ENCOUNTER — Telehealth: Payer: Self-pay | Admitting: Neurology

## 2020-11-28 NOTE — Telephone Encounter (Signed)
Patient called to see if her DMV form is ready for pickup yet? It is due 12/11/20.

## 2020-11-29 NOTE — Telephone Encounter (Signed)
Pt called and informed that her DVM paperwork is ready for pick up

## 2020-11-29 NOTE — Telephone Encounter (Signed)
Done, thanks

## 2020-12-01 ENCOUNTER — Encounter: Payer: Self-pay | Admitting: Gastroenterology

## 2020-12-01 ENCOUNTER — Other Ambulatory Visit: Payer: Self-pay

## 2020-12-01 ENCOUNTER — Ambulatory Visit (AMBULATORY_SURGERY_CENTER): Payer: 59 | Admitting: Gastroenterology

## 2020-12-01 VITALS — BP 147/84 | HR 57 | Temp 97.3°F | Resp 11 | Ht 63.0 in | Wt 210.0 lb

## 2020-12-01 DIAGNOSIS — D124 Benign neoplasm of descending colon: Secondary | ICD-10-CM | POA: Diagnosis not present

## 2020-12-01 DIAGNOSIS — D122 Benign neoplasm of ascending colon: Secondary | ICD-10-CM

## 2020-12-01 DIAGNOSIS — Z800 Family history of malignant neoplasm of digestive organs: Secondary | ICD-10-CM | POA: Diagnosis not present

## 2020-12-01 DIAGNOSIS — D127 Benign neoplasm of rectosigmoid junction: Secondary | ICD-10-CM

## 2020-12-01 DIAGNOSIS — Z1211 Encounter for screening for malignant neoplasm of colon: Secondary | ICD-10-CM

## 2020-12-01 MED ORDER — SODIUM CHLORIDE 0.9 % IV SOLN: 500.0000 mL | Freq: Once | INTRAVENOUS | Status: DC

## 2020-12-01 NOTE — Progress Notes (Signed)
Medical history reviewed with no changes noted. VS assessed by J.D 

## 2020-12-01 NOTE — Patient Instructions (Signed)
Handouts on polyps and diverticulosis given to you today   High fiber diet recommended  Take FiberCon 1-2 tablets daily to keep bowel movements regular    YOU HAD AN ENDOSCOPIC PROCEDURE TODAY AT [facility]:   Refer to the procedure report that was given to you for any specific questions about what was found during the examination.  If the procedure report does not answer your questions, please call your gastroenterologist to clarify.  If you requested that your care partner not be given the details of your procedure findings, then the procedure report has been included in a sealed envelope for you to review at your convenience later.  YOU SHOULD EXPECT: Some feelings of bloating in the abdomen. Passage of more gas than usual.  Walking can help get rid of the air that was put into your GI tract during the procedure and reduce the bloating. If you had a lower endoscopy (such as a colonoscopy or flexible sigmoidoscopy) you may notice spotting of blood in your stool or on the toilet paper. If you underwent a bowel prep for your procedure, you may not have a normal bowel movement for a few days.  Please Note:  You might notice some irritation and congestion in your nose or some drainage.  This is from the oxygen used during your procedure.  There is no need for concern and it should clear up in a day or so.  SYMPTOMS TO REPORT IMMEDIATELY:   Following lower endoscopy (colonoscopy or flexible sigmoidoscopy):  Excessive amounts of blood in the stool  Significant tenderness or worsening of abdominal pains  Swelling of the abdomen that is new, acute  Fever of 100F or higher  For urgent or emergent issues, a gastroenterologist can be reached at any hour by calling [phone]. Do not use MyChart messaging for urgent concerns.    DIET:  We do recommend a small meal at first, but then you may proceed to your regular diet.  Drink plenty of fluids but you should avoid alcoholic  beverages for 24 hours.  ACTIVITY:  You should plan to take it easy for the rest of today and you should NOT DRIVE or use heavy machinery until tomorrow (because of the sedation medicines used during the test).    FOLLOW UP: Our staff will call the number listed on your records 48-72 hours following your procedure to check on you and address any questions or concerns that you may have regarding the information given to you following your procedure. If we do not reach you, we will leave a message.  We will attempt to reach you two times.  During this call, we will ask if you have developed any symptoms of COVID 19. If you develop any symptoms (ie: fever, flu-like symptoms, shortness of breath, cough etc.) before then, please call [phone].  If you test positive for Covid 19 in the 2 weeks post procedure, please call and report this information to [place].    If any biopsies were taken you will be contacted by phone or by letter within the next 1-3 weeks.  Please call us at [phone] if you have not heard about the biopsies in 3 weeks.    SIGNATURES/CONFIDENTIALITY: You and/or your care partner have signed paperwork which will be entered into your electronic medical record.  These signatures attest to the fact that that the information above on your After Visit Summary has been reviewed and is understood.  Full responsibility of the confidentiality  of this discharge information lies with you and/or your care-partner.

## 2020-12-01 NOTE — Progress Notes (Signed)
pt tolerated well. VSS. awake and to recovery. Report given to RN.  

## 2020-12-01 NOTE — Progress Notes (Signed)
Called to room to assist during endoscopic procedure.  Patient ID and intended procedure confirmed with present staff. Received instructions for my participation in the procedure from the performing physician.  

## 2020-12-01 NOTE — Op Note (Signed)
[facility] Patient Name: Carly [name] Procedure Date: 12/01/2020 2:24 PM MRN: [id number] Endoscopist: Justice Britain , MD Age: 50 Referring MD:  Date of Birth: [birth year] Gender: Female Account #: [id number] Procedure:                Colonoscopy Indications:              Screening in patient at increased risk: Colorectal                            cancer in father before age 64 Medicines:                Monitored Anesthesia Care Procedure:                Pre-Anesthesia Assessment:                           - Prior to the procedure, a History and Physical                            was performed, and patient medications and                            allergies were reviewed. The patient's tolerance of                            previous anesthesia was also reviewed. The risks                            and benefits of the procedure and the sedation                            options and risks were discussed with the patient.                            All questions were answered, and informed consent                            was obtained. Prior Anticoagulants: The patient has                            taken no previous anticoagulant or antiplatelet                            agents. ASA Grade Assessment: II - A patient with                            mild systemic disease. After reviewing the risks                            and benefits, the patient was deemed in                            satisfactory condition to undergo the procedure.  After obtaining informed consent, the colonoscope                            was passed under direct vision. Throughout the                            procedure, the patient's blood pressure, pulse, and                            oxygen saturations were monitored continuously. The                            Colonoscope was introduced through the anus and                            advanced to the the cecum,  identified by                            appendiceal orifice and ileocecal valve. The                            colonoscopy was performed without difficulty. The                            patient tolerated the procedure. The quality of the                            bowel preparation was good. The terminal ileum,                            ileocecal valve, appendiceal orifice, and rectum                            were photographed. Scope In: 2:32:38 PM Scope Out: 2:53:27 PM Scope Withdrawal Time: 0 hours 13 minutes 35 seconds  Total Procedure Duration: 0 hours 20 minutes 49 seconds  Findings:                 The perianal and digital rectal examinations were                            normal. Pertinent negatives include no palpable                            rectal lesions.                           Three sessile polyps were found in the                            recto-sigmoid colon, descending colon and ascending                            colon. The polyps were 3 to 6 mm in size. These  polyps were removed with a cold snare. Resection                            and retrieval were complete.                           A few small-mouthed diverticula were found in the                            recto-sigmoid colon and sigmoid colon.                           Normal mucosa was found in the entire colon                            otherwise.                           Non-bleeding non-thrombosed internal hemorrhoids                            were found during endoscopy. The hemorrhoids were                            Grade I (internal hemorrhoids that do not prolapse). Complications:            No immediate complications. Estimated Blood Loss:     Estimated blood loss was minimal. Impression:               - The examined portion of the ileum was normal.                           - Three 3 to 6 mm polyps at the recto-sigmoid                            colon, in  the descending colon and in the ascending                            colon, removed with a cold snare. Resected and                            retrieved.                           - Diverticulosis in the recto-sigmoid colon and in                            the sigmoid colon.                           - Normal mucosa in the entire examined colon                            otherwise.                           -  Non-bleeding non-thrombosed internal hemorrhoids. Recommendation:           - The patient will be observed post-procedure,                            until all discharge criteria are met.                           - Discharge patient to home.                           - Patient has a contact number available for                            emergencies. The signs and symptoms of potential                            delayed complications were discussed with the                            patient. Return to normal activities tomorrow.                            Written discharge instructions were provided to the                            patient.                           - High fiber diet.                           - Use FiberCon 1-2 tablets PO daily.                           - Continue present medications.                           - Await pathology results.                           - Repeat colonoscopy in 3/5 years for                            surveillance/screening (3 years if all tissue is                            adenomatous or 5-years no matter pathology due to                            family history of father with early colon cancer).                           - The findings and recommendations were discussed  with the patient.                           - The findings and recommendations were discussed                            with the patient's family. Justice Britain, MD 12/01/2020 2:59:30 PM

## 2020-12-05 ENCOUNTER — Telehealth: Payer: Self-pay | Admitting: *Deleted

## 2020-12-05 NOTE — Telephone Encounter (Signed)
  Follow up Call-  Call back number 12/01/2020  Post procedure Call Back phone  # [phone]  Permission to leave phone message No  Some recent data might be hidden     Patient questions:  Do you have a fever, pain , or abdominal swelling? No. Pain Score  0 *  Have you tolerated food without any problems? Yes.    Have you been able to return to your normal activities? Yes.    Do you have any questions about your discharge instructions: Diet   No. Medications  No. Follow up visit  No.  Do you have questions or concerns about your Care? No.  Actions: * If pain score is 4 or above: No action needed, pain <4.  1. Have you developed a fever since your procedure? no  2.   Have you had an respiratory symptoms (SOB or cough) since your procedure? no  3.   Have you tested positive for COVID 19 since your procedure no  4.   Have you had any family members/close contacts diagnosed with the COVID 19 since your procedure?  no   If yes to any of these questions please route to Joylene John, RN and Joella Prince, RN

## 2020-12-11 ENCOUNTER — Encounter: Payer: Self-pay | Admitting: Gastroenterology

## 2021-05-11 ENCOUNTER — Other Ambulatory Visit: Payer: Self-pay | Admitting: Neurology

## 2021-05-23 ENCOUNTER — Telehealth: Payer: Self-pay | Admitting: Neurology

## 2021-05-23 ENCOUNTER — Other Ambulatory Visit: Payer: Self-pay

## 2021-05-23 MED ORDER — LEVETIRACETAM 750 MG PO TABS: 1500.0000 mg | ORAL_TABLET | Freq: Two times a day (BID) | ORAL | 5 refills | Status: DC

## 2021-05-23 NOTE — Telephone Encounter (Signed)
Patient called and requested refills on her levetiracetam 750 MG to last until her next appointment 11/16/21. She has been added to the wait list.  Last seen: 03/13/20   [facility]

## 2021-05-23 NOTE — Telephone Encounter (Signed)
Refill sent in for pt. 

## 2021-06-12 ENCOUNTER — Other Ambulatory Visit: Payer: Self-pay

## 2021-06-12 ENCOUNTER — Ambulatory Visit (INDEPENDENT_AMBULATORY_CARE_PROVIDER_SITE_OTHER): Payer: BC Managed Care – PPO | Admitting: Neurology

## 2021-06-12 ENCOUNTER — Encounter: Payer: Self-pay | Admitting: Neurology

## 2021-06-12 VITALS — BP 136/80 | HR 59 | Ht 62.0 in | Wt 208.6 lb

## 2021-06-12 DIAGNOSIS — Z8679 Personal history of other diseases of the circulatory system: Secondary | ICD-10-CM | POA: Diagnosis not present

## 2021-06-12 DIAGNOSIS — G40201 Localization-related (focal) (partial) symptomatic epilepsy and epileptic syndromes with complex partial seizures, not intractable, with status epilepticus: Secondary | ICD-10-CM

## 2021-06-12 MED ORDER — LEVETIRACETAM 1000 MG PO TABS: 1000.0000 mg | ORAL_TABLET | Freq: Two times a day (BID) | ORAL | 3 refills | Status: DC

## 2021-06-12 NOTE — Progress Notes (Signed)
NEUROLOGY FOLLOW UP OFFICE NOTE  TURQUOISE ESCH [id number] 08-26-1971  HISTORY OF PRESENT ILLNESS: I had the pleasure of seeing Carly [name] in follow-up in the neurology clinic on 06/12/2021. She is accompanied by her husband who helps supplement the history today. 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 She required 4 ASMs for seizure control, we have been able to wean off 3 medications, she is taking Levetiracetam 1500mg  BID without any seizures since 04/2018. On her last visit, she reported memory and communication issues, she underwent Neuropsychological testing in 05/2020 with a diagnosis of Mild Neurocognitive Disorder. Results were not lateralizing and etiology likely multifactorial with primary cause of dysfunction related to her history of aneurysm and other cerebrovascular changes, moderate anxiety.   She and her husband deny any seizures or seizure-like symptoms since 04/2018 on Levetiracetam 1500mg  BID. No staring/unresponsive episodes, gaps in time, olfactory/gustatory hallucinations, focal numbness/tingling/weakness, myoclonic jerks. No headaches, dizziness, vision changes, no falls. Sleep is okay. Mood is pretty good. No side effects on Levetiracetam. She still notices short-term memory changes, trying to remember things when talking.This is getting a little better. She denies getting lost driving, no missed medications.    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 Hospitalization was complicated by status epilepticus. In 2018, she had a bad headache and episode of loss of  consciousness with shaking at work and was found to have a subarachnoid hemorrhage secondary to ruptured aneurysm. She was discharged home on Keppra 1000mg  BID and denied any seizures or seizure-like activity until she was in the hospital for an elective procedure for enlarging aneurysm. She has no recollection of events post-op, she started having right arm and face seizure-like activity followed by a GTC. She also had an episode of right gaze deviation with vocalization, no motor activity noted. She was globally aphasic. MRI brain done 05/22/18 showed a small region of cortical T2 FLAIR hyperintensity centered within the left posterior temporal lobe with few areas of reduced diffusion. Findings probably represent seizure related activity although a component of subacute infarction is possible. No acute hemorrhage or mass effect. 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 She was intubated to achieve burst-suppression on Propofol, seizure recurrence seen with weaning Propofol, resolved with midazolam with no seizures after 05/19/18. She needed 4 AEDs, Dilantin, Keppra, Trileptal, and Vimpat. Dilantin was discontinued on hospital discharge, she was discharged on Trileptal 750mg  BID, Vimpat 200mg  BID, and Keppra 1500mg  BID.    She denies any side effects on medications. She denies any seizures since her hospitalization, her brother denies any staring/unresponsive episodes, no right-sided motor activity. She had an episode on 06/07/18 where she passed out and reported left arm numbness after. She does not recall having any focal weakness, there was  no warning, she recalls it was very hot then she woke up on the ground.  No tongue bite or incontinence. Head CT did not show any acute changes. She notices occasional right hand numbness when she lays on it in sleep. She denies any gaps in time, olfactory/gustatory hallucinations, deja vu, rising epigastric sensation, myoclonic jerks. She has had only one headache since coming home last month. She denies any dizziness, diplopia, dysarthria/dysphagia, neck/back pain, bowel/bladder dysfunction. She feels back to normal. She noticed some memory difficulties after the [facility], "have to think about things a little longer," but denies missing medications.    Epilepsy Risk Factors:  SAH secondary to ruptured left ICA aneurysm, s/p coiling and flow diversion. Her paternal cousin has seizures. Otherwise she had a normal birth and early development.  There is no history of febrile convulsions, CNS infections such as meningitis/encephalitis, significant traumatic brain injury.  Prior ASMs: Lacosamide, oxcarbazepine, Dilantin  Diagnostic Data: Repeat EEG in October 2019 showed left temporal slowing.   Follow-up MRI brain and MRA head done in 08/2019 did not show any acute changes. There was encephalomalacia in the inferomedial left frontal lobe related to prior hemorrhage, diffuse siderosis in the ventricular ependyma, cerebellar folia, and brainstem related to remote subarachnoid hemorrhage. Linear gliosis and a small amount of chronic blood products in the right frontal lobe are unchanged and related to previous ventriculostomy catheter placement. There was a small chronic cortical infarct laterally in the left occipital lobe. There is near complete left ICA signal loss due to artifact from stent and aneurysm coils, no residual aneurysm present. No new aneurysm seen.  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  She will start with 1/2 tab twice a day then go to 1 tab twice a day if needed.   GERD (gastroesophageal reflux disease)    Hydrocephalus    Hyperlipidemia 11/24/2018   Her LDL is much higher than it has been. Another question is whether or not her neurologist wants her to be on a statin and how low does she want her LDL to be ?? < 100; <70.   Hypoalbuminemia due to protein-calorie malnutrition    Hypokalemia    Hypoxemia    Migraine headaches 06/16/2008   Mild neurocognitive disorder due to multiple etiologies 05/25/2020   PEG (percutaneous endoscopic gastrostomy) status    Seizures    Related to ICA aneurysm; None reported since 2019   SIRS (systemic inflammatory response syndrome)    Status epilepticus    Related to ICA aneurysm   Subarachnoid hemorrhage    Related to ruptured ICA aneurysm   Syncope and collapse     MEDICATIONS: Current Outpatient Medications on File Prior to Visit  Medication Sig Dispense Refill   acetaminophen (TYLENOL) 325 MG tablet Take 2 tablets (650 mg total) by mouth every 6 (six) hours as needed for fever.     levETIRAcetam (KEPPRA) 750 MG tablet Take 2 tablets (1,500 mg total) by mouth 2 (two) times daily. 120 tablet 5   metoprolol succinate (TOPROL-XL) 100 MG 24 hr tablet Take 100 mg by mouth daily.     Multiple Vitamin (MULTIVITAMIN WITH MINERALS) TABS tablet Take 1 tablet by mouth daily. (Patient taking differently: Take 1 tablet by mouth daily. Women's Ultra GNC Multivitamin) 30 tablet 0  No current facility-administered medications on file prior to visit.    ALLERGIES: No Known Allergies  FAMILY HISTORY: Family History  Problem Relation Age of Onset   Diabetes Mother    Hypertension Mother    Hyperlipidemia Mother    Dementia Mother    Cancer Father         colon cancer   Colon polyps Father    Colon cancer Father    Diabetes Maternal Grandmother    Hyperlipidemia Paternal Grandfather    Seizures Cousin    Esophageal cancer Neg Hx    Stomach cancer Neg Hx    Rectal cancer Neg Hx     SOCIAL HISTORY: Social History   Socioeconomic History   Marital status: Married    Spouse name: Not on file   Number of children: Not on file   Years of education: 38   Highest education level: Some college, no degree  Occupational History   Occupation: Unemployed    Comment: [employer] prior to ICA aneurysm  Tobacco Use   Smoking status: Never   Smokeless tobacco: Never  Vaping Use   Vaping Use: Never used  Substance and Sexual Activity   Alcohol use: Not Currently    Comment: occ   Drug use: No   Sexual activity: Yes  Other Topics Concern   Not on file  Social History Narrative   Marital Status: Married [name])   Children:  Son (1) Daughter (1)    Pets: None   Living Situation: Lives with husband and children.   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[employer])   Education: [employer]    Alcohol Use:  Occasional   Drug Use:  None   Diet:  Regular   Exercise:  Walking    Hobbies: Dance   [Tobacco: Never smoker]   Right handed    One story home   No caffeine      Social Determinants of Radio broadcast assistant Strain: Not on file  Food Insecurity: Not on file  Transportation Needs: Not on file  Physical Activity: Not on file  Stress: Not on file  Social Connections: Not on file  Intimate Partner Violence: Not on file     PHYSICAL EXAM: Vitals:   06/12/21 1348  BP: 136/80  Pulse: (!) 59  SpO2: 99%   General: No acute distress Head:  Normocephalic/atraumatic Skin/Extremities: No rash, no edema Neurological Exam: alert and awake. No aphasia or dysarthria. Fund of knowledge is appropriate.  Recent and remote memory are intact.  Attention and concentration are normal.   Cranial nerves: Pupils equal, round. Extraocular  movements intact with no nystagmus. Visual fields full.  No facial asymmetry.  Motor: Bulk and tone normal, muscle strength 5/5 throughout with no pronator drift.   Finger to nose testing intact.  Gait narrow-based and steady, able to tandem walk adequately.  Romberg negative.   IMPRESSION: This is a very pleasant 50 yo RH woman with a history of subarachnoid hemorrhage s/p coil embolization of supraclinoid left ICA aneurysm in June 2018 with recurrence, who underwent elective aneurysmal treatment with flow diversion last 05/15/18. Hospitalization was complicated by status epilepticus arising from the left hemisphere requiring multiple AEDs. She continues to do well, with no symptoms since 2019. Follow-up MRI showed encephalomalacia in the inferomedial left frontal lobe and chronic cortical infarct in left occipital lobe.We discussed reducing Levetiracetam to 1000mg  BID, however will likely need to continue taking seizure medication long-term. She is aware of [place] driving laws to  stop driving after a seizure until 6 months seizure-free. She was advised to check in with her neurosurgeon if any follow-up is needed for history of aneurysm. Follow-up in 1 year, call for any changes.   Thank you for allowing me to participate in her care.  Please do not hesitate to call for any questions or concerns.    Ellouise Newer, M.D.   CC: Dr. Dion Saucier

## 2021-06-12 NOTE — Patient Instructions (Signed)
Good to see you!  A new prescription has been sent for Levetiracetam 1000mg : Take 1 tablet twice a day  2. Call your neurosurgeon to make sure if no follow-up is needed  3. Follow-up in 1 year, call for any changes   Seizure Precautions: 1. If medication has been prescribed for you to prevent seizures, take it exactly as directed.  Do not stop taking the medicine without talking to your doctor first, even if you have not had a seizure in a long time.   2. Avoid activities in which a seizure would cause danger to yourself or to others.  Don't operate dangerous machinery, swim alone, or climb in high or dangerous places, such as on ladders, roofs, or girders.  Do not drive unless your doctor says you may.  3. If you have any warning that you may have a seizure, lay down in a safe place where you can't hurt yourself.    4.  No driving for 6 months from last seizure, as per [facility].   Please refer to the following link on the [facility] website for more information: http://www.epilepsyfoundation.org/[email or web address]/driving/drivingu.cfm   5.  Maintain good sleep hygiene. Avoid alcohol  6.  Contact your doctor if you have any problems that may be related to the medicine you are taking.  7.  Call 911 and bring the patient back to the ED if:        A.  The seizure lasts longer than 5 minutes.       B.  The patient doesn't awaken shortly after the seizure  C.  The patient has new problems such as difficulty seeing, speaking or moving  D.  The patient was injured during the seizure  E.  The patient has a temperature over 102 F (39C)  F.  The patient vomited and now is having trouble breathing

## 2021-07-02 DIAGNOSIS — I129 Hypertensive chronic kidney disease with stage 1 through stage 4 chronic kidney disease, or unspecified chronic kidney disease: Secondary | ICD-10-CM | POA: Insufficient documentation

## 2021-07-02 HISTORY — DX: Hypertensive chronic kidney disease with stage 1 through stage 4 chronic kidney disease, or unspecified chronic kidney disease: I12.9

## 2021-07-03 ENCOUNTER — Telehealth: Payer: Self-pay | Admitting: Neurology

## 2021-07-03 NOTE — Telephone Encounter (Signed)
Tried calling pt, no answer.Unable LVM.   After reviewing the chart no one has called the patient.

## 2021-07-03 NOTE — Telephone Encounter (Signed)
Pt left message with access nurse. Stated she was returning a call to Dr. Amparo Bristol nurse.

## 2021-07-04 NOTE — Telephone Encounter (Signed)
Pt was calling asking if Dr Delice Lesch would write a letter to the [facility] asking if she could be removed from the medical review stating how her health is over the past 3 years?

## 2021-07-04 NOTE — Telephone Encounter (Signed)
There is a removal request form that she needs to fill out, she writes that she has been seizure-free for 3 years and if the Medical Advisory Board needs more information from me, they will let her know.   This is the form:  [email or web address]

## 2021-07-04 NOTE — Telephone Encounter (Signed)
Pt called and informed that There is a removal request form that she needs to fill out, she writes that she has been seizure-free for 3 years and if the Medical Advisory Board needs more information from Dr Delice Lesch , they will let her know

## 2021-11-01 ENCOUNTER — Telehealth: Payer: BC Managed Care – PPO | Admitting: Family

## 2021-11-01 DIAGNOSIS — G43019 Migraine without aura, intractable, without status migrainosus: Secondary | ICD-10-CM

## 2021-11-01 MED ORDER — SUMATRIPTAN SUCCINATE 100 MG PO TABS: 100.0000 mg | ORAL_TABLET | ORAL | 0 refills | Status: DC | PRN

## 2021-11-01 NOTE — Progress Notes (Signed)
Virtual Visit Consent   Carly [name], you are scheduled for a virtual visit with a [facility] provider today.     Just as with appointments in the office, your consent must be obtained to participate.  Your consent will be active for this visit and any virtual visit you may have with one of our providers in the next 365 days.     If you have a MyChart account, a copy of this consent can be sent to you electronically.  All virtual visits are billed to your insurance company just like a traditional visit in the office.    As this is a virtual visit, video technology does not allow for your provider to perform a traditional examination.  This may limit your provider's ability to fully assess your condition.  If your provider identifies any concerns that need to be evaluated in person or the need to arrange testing (such as labs, EKG, etc.), we will make arrangements to do so.     Although advances in technology are sophisticated, we cannot ensure that it will always work on either your end or our end.  If the connection with a video visit is poor, the visit may have to be switched to a telephone visit.  With either a video or telephone visit, we are not always able to ensure that we have a secure connection.     I need to obtain your verbal consent now.   Are you willing to proceed with your visit today?    Carly [name] has provided verbal consent on 11/01/2021 for a virtual visit (video or telephone).   Evelina Dun, FNP   Date: 11/01/2021 6:48 PM   Virtual Visit via Video Note   I, Evelina Dun, connected with  Carly [name]  ([id number], [birth year]) on 11/01/21 at  6:45 PM EST by a video-enabled telemedicine application and verified that I am speaking with the correct person using two identifiers.  Location: Patient: Virtual Visit Location Patient: Home Provider: Virtual Visit Location Provider: Home Office   I discussed the limitations of evaluation and management by telemedicine  and the availability of in person appointments. The patient expressed understanding and agreed to proceed.    History of Present Illness: Carly [name] is a 51 y.o. who identifies as a female who was assigned female at birth, and is being seen today for migraine that started today.  HPI: Migraine  This is a new problem. The current episode started today. The problem occurs intermittently. The problem has been unchanged. The pain is located in the Right unilateral region. The pain does not radiate. The pain quality is similar to prior headaches. The quality of the pain is described as aching. The pain is at a severity of 8/10. The pain is mild. Associated symptoms include phonophobia and photophobia. Pertinent negatives include no blurred vision, eye pain, eye redness, hearing loss, loss of balance, nausea, seizures or weakness. Nothing aggravates the symptoms. She has tried NSAIDs for the symptoms. The treatment provided mild relief.   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   History of vitrectomy 10/31/2020   Mild neurocognitive disorder  due to multiple etiologies 05/25/2020   Hyperlipidemia 11/24/2018   Need for shingles vaccine 11/24/2018   Syncope and collapse 06/10/2018   Seizures    Debility    Hypoalbuminemia due to protein-calorie malnutrition    Acute encephalopathy    Hypoxemia    Status epilepticus    Subarachnoid hemorrhage 08/30/2017   Adjustment disorder with depressed mood 08/30/2017   AKI (acute kidney injury)    Dislodged gastrostomy tube    Sepsis    Hypokalemia    SIRS (systemic inflammatory response syndrome)    Decreased oral  intake    Labile blood pressure    Transaminitis    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   Hydrocephalus (HCC)    Tachycardia    PEG (percutaneous endoscopic gastrostomy) status    Acute blood loss anemia    Tachypnea    Electrolyte imbalance 03/19/2017   History of ETT    Encounter for intubation    Acute respiratory failure    Prediabetes 11/12/2016   Pure hypercholesterolemia 11/12/2016   Impaired fasting glucose 08/23/2013   Plantar fasciitis, bilateral 08/23/2013   Vitamin D deficiency 08/23/2013   Non morbid obesity due to excess calories 08/23/2013   Benign essential hypertension 12/31/2012   GERD (gastroesophageal reflux disease) 12/31/2012   Essential hypertension 12/31/2012   Migraine headaches 06/16/2008   Chest pressure 06/16/2008    Allergies: No Known Allergies Medications:  Current Outpatient Medications:    SUMAtriptan (IMITREX) 100 MG tablet, Take 1 tablet (100 mg total) by mouth every 2 (two) hours as needed for migraine. May repeat in 2 hours if headache persists or recurs., Disp: 10 tablet, Rfl: 0   acetaminophen (TYLENOL) 325 MG tablet, Take 2 tablets (650 mg total) by mouth every 6 (six) hours as needed for fever., Disp: , Rfl:    levETIRAcetam (KEPPRA) 1000 MG tablet, Take 1 tablet (1,000 mg total) by mouth 2 (two) times daily., Disp: 180 tablet, Rfl: 3   metoprolol succinate (TOPROL-XL) 100 MG 24 hr tablet, Take 100 mg by mouth daily., Disp: , Rfl:    Multiple Vitamin (MULTIVITAMIN WITH MINERALS) TABS tablet, Take 1 tablet by mouth daily. (Patient taking differently: Take 1 tablet by mouth daily. Women's Ultra GNC Multivitamin), Disp: 30 tablet, Rfl: 0  Observations/Objective: Patient is well-developed, well-nourished in no acute distress.  Resting comfortably  at home.  Head is normocephalic, atraumatic.  No labored breathing.  Speech is clear and coherent with logical content.  Patient is alert and oriented at baseline.  Neuro exam  normal   Assessment and Plan: 1. Intractable migraine without aura and without status migrainosus - SUMAtriptan (IMITREX) 100 MG tablet; Take 1 tablet (100 mg total) by mouth every 2 (two) hours as needed for migraine. May repeat in 2 hours if headache persists or recurs.  Dispense: 10 tablet; Refill: 0  Continue tylenol Imitrex as needed If any changes in gait, speech, vision go to ED If headache does not improve in next 24 hours needs to be seen in person  Avoid caffeine   Follow Up Instructions: I discussed the assessment and treatment plan with the patient. The patient was provided an opportunity to ask questions and all were answered. The patient agreed with the plan and demonstrated an understanding of the instructions.  A copy of instructions were sent to the patient via MyChart unless otherwise noted below.    The patient was advised to call back or seek an in-person evaluation if the symptoms worsen  or if the condition fails to improve as anticipated.  Time:  I spent 12 minutes with the patient via telehealth technology discussing the above problems/concerns.    Evelina Dun, FNP

## 2021-11-02 ENCOUNTER — Telehealth: Payer: Self-pay | Admitting: Neurology

## 2021-11-02 DIAGNOSIS — G43019 Migraine without aura, intractable, without status migrainosus: Secondary | ICD-10-CM

## 2021-11-02 DIAGNOSIS — Z8679 Personal history of other diseases of the circulatory system: Secondary | ICD-10-CM

## 2021-11-02 NOTE — Telephone Encounter (Signed)
Pt called an informed that Dr Delice Lesch ordered a CTA and that [facility] imaging will call her to get it scheduled

## 2021-11-02 NOTE — Telephone Encounter (Signed)
Pt called no answer when she calls back will let her know that Dr Delice Lesch ordered a CTA and that [facility] imaging will call her to get it scheduled

## 2021-11-02 NOTE — Telephone Encounter (Signed)
Patient left message with the access nurse.  She states she has a bad migraine headache.  She states it is lasting longer than normal.Pain 7/10, had a brain aneurysm in 2018.  Slightly nauseas.

## 2021-11-02 NOTE — Telephone Encounter (Signed)
Let's repeat a CTA head with contrast to check on blood vessels in brain, dx: worsening headaches, history of aneurysm. Thanks

## 2021-11-02 NOTE — Telephone Encounter (Signed)
How frequent or the headaches (on average, how many days a week/month are they occurring)?  Once a month  How long do the headaches last?  Lasted all day  Verify what preventative medication and dose you are taking (e.g. topiramate, propranolol, amitriptyline, Emgality, etc)  none Verify which rescue medication you are taking (triptan, Advil, Excedrin, Aleve, Ubrelvy, etc)  started on imitrex How often are you taking pain relievers/analgesics/rescue mediction?  Tylenol before wasn't helping    Carly [name] started her on Sumatriptan an it helped ,  She was scared be cause last time she had a headache like this is when she had a her aneurysm

## 2021-11-16 ENCOUNTER — Ambulatory Visit: Payer: 59 | Admitting: Neurology

## 2021-11-28 ENCOUNTER — Other Ambulatory Visit: Payer: Self-pay | Admitting: Family

## 2021-11-28 DIAGNOSIS — G43019 Migraine without aura, intractable, without status migrainosus: Secondary | ICD-10-CM

## 2021-11-29 ENCOUNTER — Ambulatory Visit
Admission: RE | Admit: 2021-11-29 | Discharge: 2021-11-29 | Disposition: A | Discharge status: Home / Self Care | Payer: BC Managed Care – PPO | Source: Ambulatory Visit | Attending: Neurology | Admitting: Neurology

## 2021-11-29 ENCOUNTER — Other Ambulatory Visit: Payer: Self-pay

## 2021-11-29 MED ORDER — IOPAMIDOL (ISOVUE-370) INJECTION 76%
75.0000 mL | Freq: Once | INTRAVENOUS | Status: AC | PRN
  Administered 2021-11-29: 75 mL via INTRAVENOUS

## 2021-12-03 ENCOUNTER — Telehealth: Payer: Self-pay

## 2021-12-03 DIAGNOSIS — G43019 Migraine without aura, intractable, without status migrainosus: Secondary | ICD-10-CM

## 2021-12-03 MED ORDER — SUMATRIPTAN SUCCINATE 100 MG PO TABS: ORAL_TABLET | ORAL | 6 refills | Status: AC

## 2021-12-03 NOTE — Telephone Encounter (Signed)
Rx sent to [facility] on file, thanks ?

## 2021-12-03 NOTE — Telephone Encounter (Signed)
-----   Message from Cameron Sprang, MD sent at 12/02/2021  6:06 PM EDT ----- ?Pls let her know the CTA did not show any new aneurysm, no changes from her prior scan. How is she feeling? Thanks ?

## 2021-12-03 NOTE — Telephone Encounter (Signed)
Pt called an informed that CTA did not show any new aneurysm, no changes from her prior scan. How is she feeling? She stated that she is feeling ok she said that she is going through menopause and that at that time her headache was bad near time of her period, she is asking if you can refill her imitrex  ?

## 2021-12-04 ENCOUNTER — Telehealth: Payer: Self-pay

## 2021-12-04 NOTE — Telephone Encounter (Signed)
Pt called no answer unable to leave a voice mail, sent a mychart message ,  ?

## 2021-12-04 NOTE — Telephone Encounter (Signed)
PT MRI PA approved 11/26/21 until 12/25/21 ?

## 2022-01-01 DIAGNOSIS — Z0279 Encounter for issue of other medical certificate: Secondary | ICD-10-CM

## 2022-01-08 ENCOUNTER — Telehealth: Payer: Self-pay | Admitting: Neurology

## 2022-01-08 NOTE — Telephone Encounter (Signed)
Pt is wanting to see if Dr Delice Lesch has finished her forms that she needed to be filled out  ?Please call  ?

## 2022-01-09 NOTE — Telephone Encounter (Signed)
Pt came by to pick up forms.

## 2022-01-09 NOTE — Telephone Encounter (Signed)
Form done, thanks ?

## 2022-01-09 NOTE — Telephone Encounter (Signed)
Patient called and stated she needs forms filled out for disability.  She has a few questions she needs to ask. ?

## 2022-01-10 DIAGNOSIS — Z0279 Encounter for issue of other medical certificate: Secondary | ICD-10-CM

## 2022-01-21 ENCOUNTER — Telehealth: Payer: Self-pay | Admitting: Neurology

## 2022-01-21 NOTE — Telephone Encounter (Signed)
Done, all pages filled out, thanks ?

## 2022-01-21 NOTE — Telephone Encounter (Signed)
Patient would like to know if her forms are ready. It is time sensitive. DMV paperwork ?

## 2022-01-22 NOTE — Telephone Encounter (Signed)
Pt called no answer no voice mail set up 

## 2022-01-22 NOTE — Telephone Encounter (Signed)
Pt called back and was informed that DMV paperwork is ready ?

## 2022-04-17 ENCOUNTER — Encounter: Payer: Self-pay | Admitting: Neurology

## 2022-04-17 ENCOUNTER — Ambulatory Visit (INDEPENDENT_AMBULATORY_CARE_PROVIDER_SITE_OTHER): Payer: BC Managed Care – PPO | Admitting: Neurology

## 2022-04-17 VITALS — BP 134/83 | HR 69 | Ht 63.0 in | Wt 229.0 lb

## 2022-04-17 DIAGNOSIS — G43009 Migraine without aura, not intractable, without status migrainosus: Secondary | ICD-10-CM

## 2022-04-17 DIAGNOSIS — G40201 Localization-related (focal) (partial) symptomatic epilepsy and epileptic syndromes with complex partial seizures, not intractable, with status epilepticus: Secondary | ICD-10-CM | POA: Diagnosis not present

## 2022-04-17 NOTE — Progress Notes (Unsigned)
NEUROLOGY FOLLOW UP OFFICE NOTE  JOELI FENNER [id number] [birth year]  HISTORY OF PRESENT ILLNESS: I had the pleasure of seeing Carly [name] in follow-up in the neurology clinic on 04/17/2022. She is again accompanied by her husband who helps supplement the history today. 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 She required 4 ASMs for seizure control, we have been able to wean off 3 medications, now on Levetiracetam '1000mg'$  BID without seizures or seizure-like symptoms since 04/2018, no side effects. They deny any staring/unresponsive episodes, gaps in time, olfactory/gustatory hallucinations, focal numbness/tingling/weakness, myoclonic jerks. Sometimes her left hand feels like she is holding something but there is nothing in her hand. She called our office to report a prolonged headache in February. Tylenol was not helping. She was concerned because headache was similar to when she was found to have an aneurysm. She had a CTA head in 11/2021 with no acute changes, treated left ICA aneurysm with artifact, no new aneurysm seen. She reports that the Imitrex helped, she has only needed it 2 times since then. She denies any dizziness, vision changes, no falls. Memory is pretty good, her husband notes she does not repeat herself like before. She manages her own medications without issues and denies getting lost driving. Mood is okay.    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 Hospitalization was complicated by status epilepticus. In 2018, she had a bad headache and episode of loss of consciousness  with shaking at work and was found to have a subarachnoid hemorrhage secondary to ruptured aneurysm. She was discharged home on Keppra '1000mg'$  BID and denied any seizures or seizure-like activity until she was in the hospital for an elective procedure for enlarging aneurysm. She has no recollection of events post-op, she started having right arm and face seizure-like activity followed by a GTC. She also had an episode of right gaze deviation with vocalization, no motor activity noted. She was globally aphasic. MRI brain done 05/22/18 showed a small region of cortical T2 FLAIR hyperintensity centered within the left posterior temporal lobe with few areas of reduced diffusion. Findings probably represent seizure related activity although a component of subacute infarction is possible. No acute hemorrhage or mass effect. 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 She was intubated to achieve burst-suppression on Propofol, seizure recurrence seen with weaning Propofol, resolved with midazolam with no seizures after 05/19/18. She needed 4 AEDs, Dilantin, Keppra, Trileptal, and Vimpat. Dilantin was discontinued on hospital discharge, she was discharged on Trileptal '750mg'$  BID, Vimpat '200mg'$  BID, and Keppra '1500mg'$  BID.    She denies any side effects on medications. She denies any seizures since her hospitalization, her brother denies any staring/unresponsive episodes, no right-sided motor activity. She had an episode on 06/07/18 where she passed out and reported left arm numbness after. She does not recall having any focal weakness, there  was no warning, she  recalls it was very hot then she woke up on the ground. No tongue bite or incontinence. Head CT did not show any acute changes. She notices occasional right hand numbness when she lays on it in sleep. She denies any gaps in time, olfactory/gustatory hallucinations, deja vu, rising epigastric sensation, myoclonic jerks. She has had only one headache since coming home last month. She denies any dizziness, diplopia, dysarthria/dysphagia, neck/back pain, bowel/bladder dysfunction. She feels back to normal. She noticed some memory difficulties after the [facility], "have to think about things a little longer," but denies missing medications.    Epilepsy Risk Factors:  SAH secondary to ruptured left ICA aneurysm, s/p coiling and flow diversion. Her paternal cousin has seizures. Otherwise she had a normal birth and early development.  There is no history of febrile convulsions, CNS infections such as meningitis/encephalitis, significant traumatic brain injury.  Prior ASMs: Lacosamide, oxcarbazepine, Dilantin  Diagnostic Data: Repeat EEG in October 2019 showed left temporal slowing.   Follow-up MRI brain and MRA head done in 08/2019 did not show any acute changes. There was encephalomalacia in the inferomedial left frontal lobe related to prior hemorrhage, diffuse siderosis in the ventricular ependyma, cerebellar folia, and brainstem related to remote subarachnoid hemorrhage. Linear gliosis and a small amount of chronic blood products in the right frontal lobe are unchanged and related to previous ventriculostomy catheter placement. There was a small chronic cortical infarct laterally in the left occipital lobe. There is near complete left ICA signal loss due to artifact from stent and aneurysm coils, no residual aneurysm present. No new aneurysm seen.  Neuropsychological testing in 05/2020 with a diagnosis of Mild Neurocognitive Disorder. Results were not lateralizing and etiology likely multifactorial with primary  cause of dysfunction related to her history of aneurysm and other cerebrovascular changes, moderate anxiety.    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  She will start with 1/2 tab twice a day then go to 1 tab twice a day if needed.   GERD (gastroesophageal reflux disease)    Hydrocephalus    Hyperlipidemia 11/24/2018   Her LDL is much higher than it has been. Another question is whether or not her neurologist wants her to be on a statin and how low does she want her LDL to be ?? < 100; <70.   Hypoalbuminemia due to protein-calorie malnutrition    Hypokalemia    Hypoxemia    Migraine headaches 06/16/2008   Mild neurocognitive disorder due to multiple etiologies 05/25/2020   PEG (percutaneous endoscopic gastrostomy) status    Seizures    Related to ICA aneurysm; None reported since 2019   SIRS (systemic inflammatory response syndrome)    Status epilepticus    Related to ICA aneurysm   Subarachnoid hemorrhage    Related to ruptured ICA aneurysm   Syncope and collapse     MEDICATIONS: Current Outpatient Medications on File Prior to Visit  Medication Sig Dispense Refill   acetaminophen (TYLENOL) 325 MG tablet Take 2 tablets (650 mg total) by mouth every 6 (six) hours as needed for fever.     levETIRAcetam (KEPPRA) 1000 MG tablet Take 1 tablet (1,000 mg total) by mouth 2 (two)  times daily. 180 tablet 3   metoprolol succinate (TOPROL-XL) 100 MG 24 hr tablet Take 100 mg by mouth daily.     Multiple Vitamin (MULTIVITAMIN WITH MINERALS) TABS tablet Take 1 tablet by mouth daily. (Patient taking differently: Take 1 tablet by mouth daily. Women's Ultra GNC Multivitamin) 30 tablet 0   SUMAtriptan (IMITREX) 100  MG tablet Take 1 tablet at onset of migraine. Do not take more than 3 a week. 10 tablet 6   No current facility-administered medications on file prior to visit.    ALLERGIES: No Known Allergies  FAMILY HISTORY: Family History  Problem Relation Age of Onset   Diabetes Mother    Hypertension Mother    Hyperlipidemia Mother    Dementia Mother    Cancer Father        colon cancer   Colon polyps Father    Colon cancer Father    Diabetes Maternal Grandmother    Hyperlipidemia Paternal Grandfather    Seizures Cousin    Esophageal cancer Neg Hx    Stomach cancer Neg Hx    Rectal cancer Neg Hx     SOCIAL HISTORY: Social History   Socioeconomic History   Marital status: Married    Spouse name: Not on file   Number of children: Not on file   Years of education: 46   Highest education level: Some college, no degree  Occupational History   Occupation: Unemployed    Comment: [employer] prior to ICA aneurysm  Tobacco Use   Smoking status: Never   Smokeless tobacco: Never  Vaping Use   Vaping Use: Never used  Substance and Sexual Activity   Alcohol use: Not Currently    Comment: occ   Drug use: No   Sexual activity: Yes  Other Topics Concern   Not on file  Social History Narrative   Marital Status: Married [name])   Children:  Son (1) Daughter (1)    Pets: None   Living Situation: Lives with husband and children.   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[employer])   Education: [employer]    Alcohol Use:  Occasional   Drug Use:  None   Diet:  Regular   Exercise:  Walking    Hobbies: Dance   [Tobacco: Never smoker]   Right handed    One story home   No caffeine      Social Determinants of Radio broadcast assistant Strain: Not on file  Food Insecurity: Not on file  Transportation Needs: Not on file  Physical Activity: Not on file  Stress: Not on file  Social Connections: Not on file  Intimate Partner Violence: Not on file     PHYSICAL EXAM: Vitals:   04/17/22  1358  BP: 134/83  Pulse: 69  SpO2: 98%   General: No acute distress Head:  Normocephalic/atraumatic Skin/Extremities: No rash, no edema Neurological Exam: alert and awake. No aphasia or dysarthria. Fund of knowledge is appropriate.  Attention and concentration are normal.   Cranial nerves: Pupils equal, round. Extraocular movements intact with no nystagmus. Visual fields full.  No facial asymmetry.  Motor: Bulk and tone normal, muscle strength 5/5 throughout with no pronator drift.   Finger to nose testing intact.  Gait narrow-based and steady, able to tandem walk adequately.  Romberg negative.   IMPRESSION: This is a very pleasant 51 yo RH woman with a history of subarachnoid hemorrhage s/p coil embolization of supraclinoid left ICA aneurysm in June 2018 with recurrence, who underwent elective  aneurysmal treatment with flow diversion last 05/15/18. Hospitalization was complicated by status epilepticus arising from the left hemisphere requiring multiple AEDs. She continues to do well seizure-free since 2019. Follow-up MRI in 2020 showed encephalomalacia in the inferomedial left frontal lobe and chronic cortical infarct in left occipital lobe. She has weaned off 3 ASMs and expresses interest to potentially wean down/off Levetiracetam as well. We discussed risks for seizure recurrence with underlying structural abnormality. She would like to proceed, we will do an EEG. If normal, we can reduce Levetiracetam to '750mg'$  BID. Headaches better, she has prn Imitrex. Repeat CTA in 11/2021 no new changes. She is aware of [place] driving laws to stop driving after a seizure until 6 months seizure-free. Follow-up in 6 months, call for any changes.     Thank you for allowing me to participate in her care.  Please do not hesitate to call for any questions or concerns.    Ellouise Newer, M.D.   CC: Dr. Dion Saucier

## 2022-04-17 NOTE — Patient Instructions (Signed)
Good to see you.  Schedule EEG. Our office will call with results. If normal, we will plan to reduce Keppra to '750mg'$  twice a day. For now, continue Keppra '1000mg'$  twice a day  2. Let me know when you need refills for Imitrex.  3. Follow-up in 6 months, call for any changes   Seizure Precautions: 1. If medication has been prescribed for you to prevent seizures, take it exactly as directed.  Do not stop taking the medicine without talking to your doctor first, even if you have not had a seizure in a long time.   2. Avoid activities in which a seizure would cause danger to yourself or to others.  Don't operate dangerous machinery, swim alone, or climb in high or dangerous places, such as on ladders, roofs, or girders.  Do not drive unless your doctor says you may.  3. If you have any warning that you may have a seizure, lay down in a safe place where you can't hurt yourself.    4.  No driving for 6 months from last seizure, as per [facility].   Please refer to the following link on the [place] website for more information: http://[email or web address].org/answerplace/Social/driving/drivingu.cfm   5.  Maintain good sleep hygiene. Avoid alcohol  6.  Contact your doctor if you have any problems that may be related to the medicine you are taking.  7.  Call 911 and bring the patient back to the ED if:        A.  The seizure lasts longer than 5 minutes.       B.  The patient doesn't awaken shortly after the seizure  C.  The patient has new problems such as difficulty seeing, speaking or moving  D.  The patient was injured during the seizure  E.  The patient has a temperature over 102 F (39C)  F.  The patient vomited and now is having trouble breathing

## 2022-05-31 ENCOUNTER — Ambulatory Visit (HOSPITAL_COMMUNITY)
Admission: RE | Admit: 2022-05-31 | Discharge: 2022-05-31 | Disposition: A | Discharge status: Home / Self Care | Payer: Medicare Other | Source: Ambulatory Visit | Attending: Neurology | Admitting: Neurology

## 2022-05-31 DIAGNOSIS — R9401 Abnormal electroencephalogram [EEG]: Secondary | ICD-10-CM | POA: Diagnosis not present

## 2022-05-31 DIAGNOSIS — Z8679 Personal history of other diseases of the circulatory system: Secondary | ICD-10-CM | POA: Insufficient documentation

## 2022-05-31 DIAGNOSIS — G9389 Other specified disorders of brain: Secondary | ICD-10-CM | POA: Insufficient documentation

## 2022-05-31 DIAGNOSIS — G40201 Localization-related (focal) (partial) symptomatic epilepsy and epileptic syndromes with complex partial seizures, not intractable, with status epilepticus: Secondary | ICD-10-CM | POA: Insufficient documentation

## 2022-05-31 NOTE — Progress Notes (Addendum)
EEG complete - results pending 

## 2022-06-10 NOTE — Procedures (Signed)
ELECTROENCEPHALOGRAM REPORT  Date of Study: 05/31/2022  Patient's Name: Carly [name] MRN: [id number] Date of Birth: Apr 20, 1971  Referring Provider: Dr. Ellouise Newer  Clinical History: This is a 51 year old woman with a history of SAH secondary to left ICA aneurysm s/p coil embolization and flow diversion, status epilepticus in 2019. She has been seizure-free and interested in reducing seizure medication.   Medications: Levetiracetam  Technical Summary: A multichannel digital EEG recording measured by the international 10-20 system with electrodes applied with paste and impedances below 5000 ohms performed in our laboratory with EKG monitoring in an awake and asleep patient.  Hyperventilation and photic stimulation were performed.  The digital EEG was referentially recorded, reformatted, and digitally filtered in a variety of bipolar and referential montages for optimal display.    Description: The patient is awake and asleep during the recording.  During maximal wakefulness, there is a poorly sustained low voltage 9 Hz posterior dominant rhythm.  There is occasional focal 4-5 Hz theta slowing over the left temporal region, at times sharply contoured without clear epileptogenic potential. During drowsiness and sleep, there is an increase in theta slowing of the background.  Vertex waves and symmetric sleep spindles were seen. Hyperventilation and photic stimulation did not elicit any abnormalities.  There were no clear epileptiform discharges or electrographic seizures seen.    EKG lead was unremarkable.  Impression: This awake and asleep EEG is abnormal due to occasional focal slowing over the left temporal region.  Clinical Correlation of the above findings indicates focal cerebral dysfunction over the left temporal region suggestive of underlying structural or physiologic abnormality. The absence of epileptiform discharges does not exclude a clinical diagnosis of epilepsy. Clinical  correlation is advised.    Ellouise Newer, M.D.

## 2022-06-17 ENCOUNTER — Ambulatory Visit: Payer: BC Managed Care – PPO | Admitting: Neurology

## 2022-07-10 ENCOUNTER — Other Ambulatory Visit: Payer: Self-pay | Admitting: Neurology

## 2022-07-15 ENCOUNTER — Telehealth: Payer: Self-pay | Admitting: Neurology

## 2022-07-15 MED ORDER — LEVETIRACETAM 750 MG PO TABS: 750.0000 mg | ORAL_TABLET | Freq: Two times a day (BID) | ORAL | 1 refills | Status: DC

## 2022-07-15 NOTE — Addendum Note (Signed)
Addended by: Jake Seats on: 07/15/2022 01:42 PM   Modules accepted: Orders

## 2022-07-15 NOTE — Telephone Encounter (Signed)
Pls let her know the EEG showed changes reflecting her old surgery, but no seizure activity. If she could like to try to continue reducing the Keppra, we can reduce to '750mg'$  BID. Pls send in new Rx for Levetiracetam '750mg'$ : Take 1 tab BID, 90-day Rx with 3 refills. If at any point she starts having seizures, pls have her call our office and we will go back up to '1000mg'$  BID. Thanks

## 2022-07-15 NOTE — Telephone Encounter (Signed)
Pt called in and would like to get her EEG results

## 2022-07-15 NOTE — Telephone Encounter (Signed)
EEG showed changes reflecting her old surgery, but no seizure activity. If she could like to try to continue reducing the Keppra, we can reduce to '750mg'$  BID.  If at any point she starts having seizures, pls have her call our office and we will go back up to '1000mg'$  BID. Pt stated that she has keppra 750 mg at home she doesn't need refills at this time

## 2022-11-05 ENCOUNTER — Encounter (INDEPENDENT_AMBULATORY_CARE_PROVIDER_SITE_OTHER): Payer: 59 | Admitting: Ophthalmology

## 2022-11-18 ENCOUNTER — Ambulatory Visit (INDEPENDENT_AMBULATORY_CARE_PROVIDER_SITE_OTHER): Payer: No Typology Code available for payment source | Admitting: Neurology

## 2022-11-18 ENCOUNTER — Encounter: Payer: Self-pay | Admitting: Neurology

## 2022-11-18 VITALS — BP 153/70 | HR 69 | Ht 64.0 in | Wt 244.3 lb

## 2022-11-18 DIAGNOSIS — G40201 Localization-related (focal) (partial) symptomatic epilepsy and epileptic syndromes with complex partial seizures, not intractable, with status epilepticus: Secondary | ICD-10-CM

## 2022-11-18 DIAGNOSIS — G43009 Migraine without aura, not intractable, without status migrainosus: Secondary | ICD-10-CM

## 2022-11-18 NOTE — Patient Instructions (Signed)
Always good to see you. Continue Levetiracetam '750mg'$ : take 1 tablet twice a day. Follow-up in 6 months, call for any questions or concerns.    Seizure Precautions: 1. If medication has been prescribed for you to prevent seizures, take it exactly as directed.  Do not stop taking the medicine without talking to your doctor first, even if you have not had a seizure in a long time.   2. Avoid activities in which a seizure would cause danger to yourself or to others.  Don't operate dangerous machinery, swim alone, or climb in high or dangerous places, such as on ladders, roofs, or girders.  Do not drive unless your doctor says you may.  3. If you have any warning that you may have a seizure, lay down in a safe place where you can't hurt yourself.    4.  No driving for 6 months from last seizure, as per [facility].   Please refer to the following link on the [place] website for more information: http://www.epilepsyfoundation.org/answerplace/Social/driving/drivingu.cfm   5.  Maintain good sleep hygiene. Avoid alcohol.  6.  Contact your doctor if you have any problems that may be related to the medicine you are taking.  7.  Call 911 and bring the patient back to the ED if:        A.  The seizure lasts longer than 5 minutes.       B.  The patient doesn't awaken shortly after the seizure  C.  The patient has new problems such as difficulty seeing, speaking or moving  D.  The patient was injured during the seizure  E.  The patient has a temperature over 102 F (39C)  F.  The patient vomited and now is having trouble breathing

## 2022-11-18 NOTE — Progress Notes (Signed)
NEUROLOGY FOLLOW UP OFFICE NOTE  Carly [name] KX:[id number] 1971-02-20  HISTORY OF PRESENT ILLNESS: I had the pleasure of seeing Carly [name] in follow-up in the neurology clinic on 11/18/2022.  She is alone in the office today. 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 She required 4 ASMs for seizure control, we have been able to wean off 3 medications. EEG in 05/2022 showed occasional focal slowing over the left temporal region. Levetiracetam reduced to '750mg'$  BID in 06/2022. She denies any seizures or seizure-like symptoms. No staring/unresponsive episodes, gaps in time, olfactory/gustatory hallucinations, focal numbness/tingling/weakness, myoclonic jerks. Migraines have been quiet, last migraine was in March 2023 when she had to take 3 Imitrex doses. She denies any dizziness, vision changes, no falls. She had left cataract surgery in 08/2022. She gets 6 hours of sleep. Memory is good, she denies getting lost driving, no missed medications. She reports that she still has 7 bottles of Levetiracetam '750mg'$  tablets and does not need refills yet.   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 Hospitalization was complicated by status epilepticus. In 2018, she had a bad headache and episode of loss of consciousness with shaking at work and was found to have a subarachnoid hemorrhage secondary to ruptured aneurysm. She was discharged home on Keppra '1000mg'$  BID and denied any seizures or seizure-like activity until she was in the hospital for an elective procedure for enlarging aneurysm. She has no  recollection of events post-op, she started having right arm and face seizure-like activity followed by a GTC. She also had an episode of right gaze deviation with vocalization, no motor activity noted. She was globally aphasic. MRI brain done 05/22/18 showed a small region of cortical T2 FLAIR hyperintensity centered within the left posterior temporal lobe with few areas of reduced diffusion. Findings probably represent seizure related activity although a component of subacute infarction is possible. No acute hemorrhage or mass effect. 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 She was intubated to achieve burst-suppression on Propofol, seizure recurrence seen with weaning Propofol, resolved with midazolam with no seizures after 05/19/18. She needed 4 AEDs, Dilantin, Keppra, Trileptal, and Vimpat. Dilantin was discontinued on hospital discharge, she was discharged on Trileptal '750mg'$  BID, Vimpat '200mg'$  BID, and Keppra '1500mg'$  BID.    She denies any side effects on medications. She denies any seizures since her hospitalization, her brother denies any staring/unresponsive episodes, no right-sided motor activity. She had an episode on 06/07/18 where she passed out and reported left arm numbness after. She does not recall having any focal weakness, there was no warning, she recalls it was very hot then she woke up on the ground. No tongue bite or incontinence. Head CT did not show any acute changes. She notices occasional right hand numbness when she lays on it in sleep. She denies any gaps in time, olfactory/gustatory hallucinations,  deja vu, rising  epigastric sensation, myoclonic jerks. She has had only one headache since coming home last month. She denies any dizziness, diplopia, dysarthria/dysphagia, neck/back pain, bowel/bladder dysfunction. She feels back to normal. She noticed some memory difficulties after the [facility], "have to think about things a little longer," but denies missing medications.    Epilepsy Risk Factors:  SAH secondary to ruptured left ICA aneurysm, s/p coiling and flow diversion. Her paternal cousin has seizures. Otherwise she had a normal birth and early development.  There is no history of febrile convulsions, CNS infections such as meningitis/encephalitis, significant traumatic brain injury.  Prior ASMs: Lacosamide, oxcarbazepine, Dilantin  Diagnostic Data: Repeat EEG in October 2019 showed left temporal slowing.   Follow-up MRI brain and MRA head done in 08/2019 did not show any acute changes. There was encephalomalacia in the inferomedial left frontal lobe related to prior hemorrhage, diffuse siderosis in the ventricular ependyma, cerebellar folia, and brainstem related to remote subarachnoid hemorrhage. Linear gliosis and a small amount of chronic blood products in the right frontal lobe are unchanged and related to previous ventriculostomy catheter placement. There was a small chronic cortical infarct laterally in the left occipital lobe. There is near complete left ICA signal loss due to artifact from stent and aneurysm coils, no residual aneurysm present. No new aneurysm seen.  Neuropsychological testing in 05/2020 with a diagnosis of Mild Neurocognitive Disorder. Results were not lateralizing and etiology likely multifactorial with primary cause of dysfunction related to her history of aneurysm and other cerebrovascular changes, moderate anxiety.    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  She will start with 1/2 tab twice a day then go to 1 tab twice a day if needed.   GERD (gastroesophageal reflux disease)    Hydrocephalus    Hyperlipidemia 11/24/2018   Her LDL is much higher than it has been. Another question is whether or not her neurologist wants her to be on a statin and how low does she want her LDL to be ?? < 100; <70.   Hypoalbuminemia due to protein-calorie malnutrition    Hypokalemia    Hypoxemia    Migraine headaches 06/16/2008   Mild neurocognitive disorder due to multiple etiologies 05/25/2020   PEG (percutaneous endoscopic gastrostomy) status    Seizures    Related to ICA aneurysm; None reported since 2019   SIRS (systemic inflammatory response syndrome)    Status epilepticus    Related to ICA aneurysm   Subarachnoid hemorrhage    Related to ruptured ICA aneurysm   Syncope and collapse     MEDICATIONS: Current Outpatient Medications on File Prior to Visit  Medication Sig Dispense Refill   acetaminophen (TYLENOL) 325 MG tablet Take 2 tablets (650 mg total) by mouth every 6 (six) hours as needed for fever.     levETIRAcetam (KEPPRA) 750 MG tablet Take 1 tablet (750 mg total) by mouth 2 (two) times daily. 180 tablet 1   metoprolol succinate (TOPROL-XL) 100 MG 24 hr tablet Take 100 mg by mouth daily.     Multiple Vitamin (MULTIVITAMIN WITH MINERALS) TABS tablet Take 1 tablet by mouth daily. (Patient taking differently: Take 1 tablet by mouth daily. Women's Ultra [employer]  Multivitamin) 30 tablet 0   SUMAtriptan (IMITREX) 100 MG tablet Take 1 tablet at onset of migraine. Do not take more than 3 a week. 10 tablet 6   No current facility-administered medications on file prior to visit.    ALLERGIES: No Known Allergies  FAMILY HISTORY: Family History  Problem Relation Age of Onset   Diabetes Mother     Hypertension Mother    Hyperlipidemia Mother    Dementia Mother    Cancer Father        colon cancer   Colon polyps Father    Colon cancer Father    Diabetes Maternal Grandmother    Hyperlipidemia Paternal Grandfather    Seizures Cousin    Esophageal cancer Neg Hx    Stomach cancer Neg Hx    Rectal cancer Neg Hx     SOCIAL HISTORY: Social History   Socioeconomic History   Marital status: Married    Spouse name: Not on file   Number of children: Not on file   Years of education: 12   Highest education level: Some college, no degree  Occupational History   Occupation: Unemployed    Comment: [employer] prior to ICA aneurysm  Tobacco Use   Smoking status: Never   Smokeless tobacco: Never  Vaping Use   Vaping Use: Never used  Substance and Sexual Activity   Alcohol use: Not Currently    Comment: occ   Drug use: No   Sexual activity: Yes  Other Topics Concern   Not on file  Social History Narrative   Marital Status: Married [name])   Children:  Son (1) Daughter (1)    Pets: None   Living Situation: Lives with husband and children.   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[employer])   Education: [employer]    Alcohol Use:  Occasional   Drug Use:  None   Diet:  Regular   Exercise:  Walking    Hobbies: Dance   [Tobacco: Never smoker]   Right handed    One story home   No caffeine      Social Determinants of Radio broadcast assistant Strain: Not on file  Food Insecurity: Not on file  Transportation Needs: Not on file  Physical Activity: Not on file  Stress: Not on file  Social Connections: Not on file  Intimate Partner Violence: Not on file     PHYSICAL EXAM: Vitals:   11/18/22 1423  BP: (!) 153/70  Pulse: 69  SpO2: 98%   General: No acute distress Head:  Normocephalic/atraumatic Skin/Extremities: No rash, no edema Neurological Exam: alert and awake. No aphasia or dysarthria. Fund of knowledge is appropriate.   Attention and concentration are normal.    Cranial nerves: Pupils equal, round. Extraocular movements intact with no nystagmus. Visual fields full.  No facial asymmetry.  Motor: Bulk and tone normal, muscle strength 5/5 throughout with no pronator drift.   Finger to nose testing intact.  Gait narrow-based and steady, able to tandem walk adequately.  Romberg negative.   IMPRESSION: This is a very pleasant 52 yo RH woman with a history of subarachnoid hemorrhage s/p coil embolization of supraclinoid left ICA aneurysm in June 2018 with recurrence, who underwent elective aneurysmal treatment with flow diversion last 05/15/18. Hospitalization was complicated by status epilepticus arising from the left hemisphere requiring multiple AEDs. She has been seizure-free since 2019 and continues to express interest in further reducing medication. She has successfully weaned off 3 ASMs, currently on Levetiracetam '750mg'$  BID.  We discussed MRI showed encephalomalacia in the inferomedial left frontal lobe and chronic cortical infarct in left occipital lobe, EEG showed occasional left temporal slowing. We discussed risks for recurrent seizure off medication, we may consider reducing to '500mg'$  BID on next visit. She will let us know when she needs refills for Levetiracetam '750mg'$  BID. She is aware of [place] driving laws to stop driving after a seizure until 6 months seizure-free. Migraines stable, she has prn Imitrex. Follow-up in 6 months, call for any changes.    Thank you for allowing me to participate in her care.  Please do not hesitate to call for any questions or concerns.   Ellouise Newer, M.D.   CC: Caryl Ada, FNP

## 2023-01-28 ENCOUNTER — Telehealth: Payer: Self-pay | Admitting: Neurology

## 2023-01-28 NOTE — Telephone Encounter (Signed)
Pt called in wanting to make sure her beautician can use a high frequency comb on her? It uses a small electrical current to promote blood circulation. She also wants to use a low level light therapy that stimulated cell function. The pt is having hair loss and they are wanting to try and stimulate her hair to grow with these things.

## 2023-01-29 ENCOUNTER — Telehealth: Payer: Self-pay | Admitting: Neurology

## 2023-01-29 NOTE — Telephone Encounter (Signed)
Pt called an informed that Dr Karel Jarvis said it should be okay, to use the Comb

## 2023-01-29 NOTE — Telephone Encounter (Signed)
Pt left a VM stating that she just spoke with Dr Karel Jarvis nurse and she has one more question  please call her back

## 2023-01-29 NOTE — Telephone Encounter (Signed)
Pt called no answer no voice mail set up 

## 2023-01-29 NOTE — Telephone Encounter (Signed)
Pt returned call

## 2023-01-29 NOTE — Telephone Encounter (Signed)
I think it should be okay, thanks  

## 2023-01-30 NOTE — Telephone Encounter (Signed)
Pt called back all questions answered.

## 2023-02-05 DIAGNOSIS — Z0279 Encounter for issue of other medical certificate: Secondary | ICD-10-CM

## 2023-06-19 ENCOUNTER — Encounter: Payer: Self-pay | Admitting: Neurology

## 2023-06-19 ENCOUNTER — Ambulatory Visit (INDEPENDENT_AMBULATORY_CARE_PROVIDER_SITE_OTHER): Payer: Medicare HMO | Admitting: Neurology

## 2023-06-19 VITALS — BP 117/79 | HR 61 | Ht 63.0 in | Wt 239.6 lb

## 2023-06-19 DIAGNOSIS — G43009 Migraine without aura, not intractable, without status migrainosus: Secondary | ICD-10-CM | POA: Diagnosis not present

## 2023-06-19 DIAGNOSIS — G40201 Localization-related (focal) (partial) symptomatic epilepsy and epileptic syndromes with complex partial seizures, not intractable, with status epilepticus: Secondary | ICD-10-CM | POA: Diagnosis not present

## 2023-06-19 DIAGNOSIS — Z8679 Personal history of other diseases of the circulatory system: Secondary | ICD-10-CM

## 2023-06-19 DIAGNOSIS — Z0279 Encounter for issue of other medical certificate: Secondary | ICD-10-CM

## 2023-06-19 MED ORDER — LEVETIRACETAM 750 MG PO TABS: 750.0000 mg | ORAL_TABLET | Freq: Two times a day (BID) | ORAL | 3 refills | Status: DC

## 2023-06-19 NOTE — Patient Instructions (Signed)
Always a pleasure to see you. Continue Keppra (Levetiracetam) 750mg : take 1 tablet twice a day. Let me know if you need refills for the sumatriptan. Follow-up in 8 months, call for any changes.    Seizure Precautions: 1. If medication has been prescribed for you to prevent seizures, take it exactly as directed.  Do not stop taking the medicine without talking to your doctor first, even if you have not had a seizure in a long time.   2. Avoid activities in which a seizure would cause danger to yourself or to others.  Don't operate dangerous machinery, swim alone, or climb in high or dangerous places, such as on ladders, roofs, or girders.  Do not drive unless your doctor says you may.  3. If you have any warning that you may have a seizure, lay down in a safe place where you can't hurt yourself.    4.  No driving for 6 months from last seizure, as per [facility].   Please refer to the following link on the [employer] of [employer]'s website for more information: http://www.epilepsyfoundation.org/answerplace/Social/driving/drivingu.cfm   5.  Maintain good sleep hygiene. Avoid alcohol.  6.  Contact your doctor if you have any problems that may be related to the medicine you are taking.  7.  Call 911 and bring the patient back to the ED if:        A.  The seizure lasts longer than 5 minutes.       B.  The patient doesn't awaken shortly after the seizure  C.  The patient has new problems such as difficulty seeing, speaking or moving  D.  The patient was injured during the seizure  E.  The patient has a temperature over 102 F (39C)  F.  The patient vomited and now is having trouble breathing

## 2023-06-19 NOTE — Progress Notes (Signed)
NEUROLOGY FOLLOW UP OFFICE NOTE  Carly [name] [id number] [birth year]  HISTORY OF PRESENT ILLNESS: I had the pleasure of seeing Carly [name] in follow-up in the neurology clinic on 06/19/2023. She is alone in the office today. 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 She required 4 ASMs for seizure control, we have been able to wean off 3 medications. EEG in 05/2022 showed occasional focal slowing over the left temporal region. She has been on Levetiracetam 750mg  BID since 06/2022 and continues to deny any seizures or seizure-like symptoms. She denies any staring/unresponsive episodes, gaps in time, olfactory/gustatory hallucinations, focal numbness/tingling/weakness, myoclonic jerks. No dizziness, vision changes, no falls. She usually gets 6 hours of sleep. Mood is good most of the time. No side effects on LEV. Memory is okay, she denies getting lost driving or missing medications. She has not had any migraines, she still has her old prescription for prn sumatriptan and thinks she has taken only 4 tablets of it. She has some right hip pain from exercise. She goes to [employer] twice a week.   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 Hospitalization was complicated by status epilepticus. In 2018, she had a bad headache and episode of loss of consciousness with shaking at work and was found to have a subarachnoid hemorrhage secondary to ruptured aneurysm. She was discharged home on Keppra 1000mg  BID and denied any seizures or seizure-like activity until she was in the hospital for an  elective procedure for enlarging aneurysm. She has no recollection of events post-op, she started having right arm and face seizure-like activity followed by a GTC. She also had an episode of right gaze deviation with vocalization, no motor activity noted. She was globally aphasic. MRI brain done 05/22/18 showed a small region of cortical T2 FLAIR hyperintensity centered within the left posterior temporal lobe with few areas of reduced diffusion. Findings probably represent seizure related activity although a component of subacute infarction is possible. No acute hemorrhage or mass effect. 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 She was intubated to achieve burst-suppression on Propofol, seizure recurrence seen with weaning Propofol, resolved with midazolam with no seizures after 05/19/18. She needed 4 AEDs, Dilantin, Keppra, Trileptal, and Vimpat. Dilantin was discontinued on hospital discharge, she was discharged on Trileptal 750mg  BID, Vimpat 200mg  BID, and Keppra 1500mg  BID.    She denies any side effects on medications. She denies any seizures since her hospitalization, her brother denies any staring/unresponsive episodes, no right-sided motor activity. She had an episode on 06/07/18 where she passed out and reported left arm numbness after. She does not recall having any focal weakness, there was no warning, she recalls it was very hot then she woke up on the ground. No tongue bite or incontinence. Head CT did not show any acute changes. She notices occasional right hand numbness when  she lays on it in sleep. She denies any gaps in time,  olfactory/gustatory hallucinations, deja vu, rising epigastric sensation, myoclonic jerks. She has had only one headache since coming home last month. She denies any dizziness, diplopia, dysarthria/dysphagia, neck/back pain, bowel/bladder dysfunction. She feels back to normal. She noticed some memory difficulties after the [facility], "have to think about things a little longer," but denies missing medications.    Epilepsy Risk Factors:  SAH secondary to ruptured left ICA aneurysm, s/p coiling and flow diversion. Her paternal cousin has seizures. Otherwise she had a normal birth and early development.  There is no history of febrile convulsions, CNS infections such as meningitis/encephalitis, significant traumatic brain injury.  Prior ASMs: Lacosamide, oxcarbazepine, Dilantin  Diagnostic Data: Repeat EEG in October 2019 showed left temporal slowing.   Follow-up MRI brain and MRA head done in 08/2019 did not show any acute changes. There was encephalomalacia in the inferomedial left frontal lobe related to prior hemorrhage, diffuse siderosis in the ventricular ependyma, cerebellar folia, and brainstem related to remote subarachnoid hemorrhage. Linear gliosis and a small amount of chronic blood products in the right frontal lobe are unchanged and related to previous ventriculostomy catheter placement. There was a small chronic cortical infarct laterally in the left occipital lobe. There is near complete left ICA signal loss due to artifact from stent and aneurysm coils, no residual aneurysm present. No new aneurysm seen.  Neuropsychological testing in 05/2020 with a diagnosis of Mild Neurocognitive Disorder. Results were not lateralizing and etiology likely multifactorial with primary cause of dysfunction related to her history of aneurysm and other cerebrovascular changes, moderate anxiety.    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  She will start with 1/2 tab twice a day then go to 1 tab twice a day if needed.   GERD (gastroesophageal reflux disease)    Hydrocephalus    Hyperlipidemia 11/24/2018   Her LDL is much higher than it has been. Another question is whether or not her neurologist wants her to be on a statin and how low does she want her LDL to be ?? < 100; <70.   Hypoalbuminemia due to protein-calorie malnutrition    Hypokalemia    Hypoxemia    Migraine headaches 06/16/2008   Mild neurocognitive disorder due to multiple etiologies 05/25/2020   PEG (percutaneous endoscopic gastrostomy) status    Seizures    Related to ICA aneurysm; None reported since 2019   SIRS (systemic inflammatory response syndrome)    Status epilepticus    Related to ICA aneurysm   Subarachnoid hemorrhage    Related to ruptured ICA aneurysm   Syncope and collapse     MEDICATIONS: Current Outpatient Medications on File Prior to Visit  Medication Sig Dispense Refill   acetaminophen (TYLENOL) 325 MG tablet Take 2 tablets (650 mg total) by mouth every 6 (six) hours as needed for fever.     levETIRAcetam (KEPPRA) 750 MG tablet Take 1 tablet (750 mg total) by mouth 2 (two) times daily. 180 tablet 1   metoprolol succinate (TOPROL-XL) 100 MG 24 hr tablet Take 100 mg by mouth daily.     Multiple Vitamin (MULTIVITAMIN WITH MINERALS) TABS tablet Take 1 tablet by  mouth daily. (Patient taking differently: Take 1 tablet by mouth daily. Women's Ultra [employer] Multivitamin) 30 tablet 0   SUMAtriptan (IMITREX) 100 MG tablet Take 1 tablet at onset of migraine. Do not take more than 3 a week. 10 tablet 6   No current facility-administered medications on file prior to visit.    ALLERGIES: No Known Allergies  FAMILY HISTORY: Family History   Problem Relation Age of Onset   Diabetes Mother    Hypertension Mother    Hyperlipidemia Mother    Dementia Mother    Cancer Father        colon cancer   Colon polyps Father    Colon cancer Father    Diabetes Maternal Grandmother    Hyperlipidemia Paternal Grandfather    Seizures Cousin    Esophageal cancer Neg Hx    Stomach cancer Neg Hx    Rectal cancer Neg Hx     SOCIAL HISTORY: Social History   Socioeconomic History   Marital status: Married    Spouse name: Not on file   Number of children: Not on file   Years of education: 13   Highest education level: Some college, no degree  Occupational History   Occupation: Unemployed    Comment: [employer] prior to ICA aneurysm  Tobacco Use   Smoking status: Never   Smokeless tobacco: Never  Vaping Use   Vaping status: Never Used  Substance and Sexual Activity   Alcohol use: Yes    Comment: occ   Drug use: No   Sexual activity: Yes  Other Topics Concern   Not on file  Social History Narrative   Marital Status: Married Dorene Sorrow)   Children:  Son (1) Daughter (1)    Pets: None   Living Situation: Lives with husband and children.   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   Overall Financial Resource Strain (CARDIA)    Difficulty of Paying Living Expenses: Patient refused  Food Insecurity: Patient Declined (07/01/2022)   [facility]   Hunger Vital Sign    Worried About Running Out of Food in the Last Year: Patient declined    Ran Out of Food in the Last Year: Patient  declined  Transportation Needs: Patient Declined (07/01/2022)   [facility] to [facility]16/[facility] visits prior to 11/16/2022., [facility]   PRAPARE - Transportation    Lack of Transportation (Medical): Patient declined    Lack of Transportation (Non-Medical): Patient declined  Physical Activity: Unknown (12/04/2020)   [facility] visits prior to 11/16/2022., [facility] visits prior to 11/16/2022.   Exercise Vital Sign    Days of Exercise per Week: 2 days    Minutes of Exercise per Session: Not on file  Stress: No Stress Concern Present (12/04/2020)   [facility] prior to 11/16/2022., [facility]  [facility] visits prior to 11/16/2022.   [employer] of Occupational Health - Occupational Stress Questionnaire    Feeling of Stress : Only a little  Social Connections: Unknown (12/04/2020)   Received from [facility] visits prior to 11/16/2022., [facility] visits prior to 11/16/2022.   Social Connection and Isolation Panel [NHANES]    Frequency of Communication with Friends and Family: More than three times a week    Frequency of Social Gatherings with Friends and Family: Once a week    Attends Religious Services: Patient refused    Active Member of Clubs or Organizations: No    Attends Banker Meetings: Never    Marital Status: Married  Catering manager Violence: Unknown (12/04/2020)   [facility] prior to 11/16/2022., [facility] visits prior to 11/16/2022.   Humiliation, Afraid, Rape, and Kick questionnaire    Fear of Current or Ex-Partner: Patient refused    Emotionally Abused: Patient refused    Physically Abused: Patient refused    Sexually Abused: Patient refused     PHYSICAL  EXAM: Vitals:   06/19/23 0944  BP: 117/79  Pulse: 61  SpO2: 98%   General: No acute distress Head:  Normocephalic/atraumatic Skin/Extremities: No rash, no edema Neurological Exam: alert and awake. No aphasia or dysarthria. Fund of knowledge is appropriate.  Attention and concentration are normal.   Cranial nerves: Pupils equal, round. Extraocular movements intact with no nystagmus. Visual fields full.  No facial asymmetry.  Motor: Bulk and tone normal, muscle strength 5/5 throughout with no pronator drift.   Finger to nose testing intact.  Gait narrow-based and steady, able to tandem walk adequately.  Romberg negative.   IMPRESSION: This is a very pleasant 52 yo RH woman with a history of subarachnoid hemorrhage s/p coil embolization of supraclinoid left ICA aneurysm in June 2018 with recurrence, who underwent elective aneurysmal treatment with flow diversion last 05/15/18. Hospitalization was complicated by status epilepticus arising from the left hemisphere requiring multiple AEDs. She has been seizure-free since 2019, we have successfully weaned off 3 ASMs with no seizure recurrence. Continue Levetiracetam 750mg  BID. Migraines controlled, she has prn Imitrex. She is aware of [place] driving laws to stop driving after a seizure until 6 months seizure-free. Follow-up in 8 months, call for any changes.    Thank you for allowing me to participate in her care.  Please do not hesitate to call for any questions or concerns.    Patrcia Dolly, M.D.   CC: Loann Quill, FNP

## 2023-08-04 ENCOUNTER — Encounter: Payer: Self-pay | Admitting: Neurology

## 2023-10-28 DIAGNOSIS — I509 Heart failure, unspecified: Secondary | ICD-10-CM

## 2023-10-28 DIAGNOSIS — Z8679 Personal history of other diseases of the circulatory system: Secondary | ICD-10-CM

## 2023-10-28 DIAGNOSIS — N1831 Chronic kidney disease, stage 3a: Secondary | ICD-10-CM | POA: Insufficient documentation

## 2023-10-28 HISTORY — DX: Personal history of other diseases of the circulatory system: Z86.79

## 2023-10-28 HISTORY — DX: Heart failure, unspecified: I50.9

## 2023-10-28 HISTORY — DX: Chronic kidney disease, stage 3a: N18.31

## 2023-11-03 DIAGNOSIS — D696 Thrombocytopenia, unspecified: Secondary | ICD-10-CM | POA: Insufficient documentation

## 2023-11-03 HISTORY — DX: Thrombocytopenia, unspecified: D69.6

## 2024-02-20 ENCOUNTER — Ambulatory Visit: Payer: Medicare HMO | Admitting: Neurology

## 2024-03-03 ENCOUNTER — Ambulatory Visit: Payer: Medicare HMO | Admitting: Neurology

## 2024-03-03 ENCOUNTER — Encounter (INDEPENDENT_AMBULATORY_CARE_PROVIDER_SITE_OTHER): Payer: Self-pay

## 2024-03-03 ENCOUNTER — Telehealth: Payer: Self-pay | Admitting: Neurology

## 2024-03-03 ENCOUNTER — Encounter: Payer: Self-pay | Admitting: Neurology

## 2024-03-03 VITALS — BP 140/83 | HR 62 | Ht 63.0 in | Wt 232.2 lb

## 2024-03-03 DIAGNOSIS — G40201 Localization-related (focal) (partial) symptomatic epilepsy and epileptic syndromes with complex partial seizures, not intractable, with status epilepticus: Secondary | ICD-10-CM | POA: Diagnosis not present

## 2024-03-03 DIAGNOSIS — G43009 Migraine without aura, not intractable, without status migrainosus: Secondary | ICD-10-CM

## 2024-03-03 DIAGNOSIS — E663 Overweight: Secondary | ICD-10-CM

## 2024-03-03 DIAGNOSIS — Z8679 Personal history of other diseases of the circulatory system: Secondary | ICD-10-CM

## 2024-03-03 MED ORDER — LEVETIRACETAM 750 MG PO TABS: 750.0000 mg | ORAL_TABLET | Freq: Two times a day (BID) | ORAL | 4 refills | Status: AC

## 2024-03-03 NOTE — Telephone Encounter (Signed)
 Pt came in and gave us  disability paperwork. She gave forms to Dr. Ty Gales in her visit. She would like them faxed in, but does not have the fax number yet. She will get that fax number to us .

## 2024-03-03 NOTE — Progress Notes (Signed)
 NEUROLOGY FOLLOW UP OFFICE NOTE  Carly [name] [id number] [birth year]  HISTORY OF PRESENT ILLNESS: I had the pleasure of seeing Carly [name] in follow-up in the neurology clinic on 03/03/2024.  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 We have been able to wean off 3 medications. EEG in 05/2022 showed occasional left temporal slowing. She is now on Levetiracetam  750mg  BID with no seizures or seizure-like symptoms. She denies any staring/unresponsive episodes, gaps in time, olfactory/gustatory hallucinations, focal numbness/tingling/weakness, myoclonic jerks. She has not had any migraines and has not needed prn sumatriptan . No dizziness, vision changes, no falls. She continues to have short-term memory difficulties, forgetting something in a second. She denies missing medications or getting lost driving. She getes 5-6 hours of sleep. Mood is good. She is concerned she cannot get weight off despite going to the gym regularly and changing her diet 4 years ago.    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 Hospitalization was complicated by status epilepticus. In 2018, she had a bad headache and episode of loss of consciousness with shaking at work and was found to have a subarachnoid hemorrhage secondary to ruptured aneurysm. She was discharged home on Keppra  1000mg  BID and denied any seizures or seizure-like activity until she was in the hospital for an elective procedure for enlarging aneurysm. She has no recollection of events post-op, she started  having right arm and face seizure-like activity followed by a GTC. She also had an episode of right gaze deviation with vocalization, no motor activity noted. She was globally aphasic. MRI brain done 05/22/18 showed a small region of cortical T2 FLAIR hyperintensity centered within the left posterior temporal lobe with few areas of reduced diffusion. Findings probably represent seizure related activity although a component of subacute infarction is possible. No acute hemorrhage or mass effect. 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 She was intubated to achieve burst-suppression on Propofol , seizure recurrence seen with weaning Propofol , resolved with midazolam  with no seizures after 05/19/18. She needed 4 AEDs, Dilantin , Keppra , Trileptal , and Vimpat . Dilantin  was discontinued on hospital discharge, she was discharged on Trileptal  750mg  BID, Vimpat  200mg  BID, and Keppra  1500mg  BID.    She denies any side effects on medications. She denies any seizures since her hospitalization, her brother denies any staring/unresponsive episodes, no right-sided motor activity. She had an episode on 06/07/18 where she passed out and reported left arm numbness after. She does not recall having any focal weakness, there was no warning, she recalls it was very hot then she woke up on the ground. No tongue bite or incontinence. Head CT did not show any acute changes. She notices occasional right hand numbness when she lays on it in sleep. She denies any gaps in time, olfactory/gustatory hallucinations, deja vu, rising epigastric sensation, myoclonic jerks. She has  had only one headache since coming home last month. She denies any dizziness, diplopia, dysarthria/dysphagia, neck/back pain, bowel/bladder dysfunction. She feels back to normal. She noticed some memory difficulties after the [facility], have to think about things a little longer, but denies missing medications.    Epilepsy Risk Factors:  SAH secondary to ruptured left ICA aneurysm, s/p coiling and flow diversion. Her paternal cousin has seizures. Otherwise she had a normal birth and early development.  There is no history of febrile convulsions, CNS infections such as meningitis/encephalitis, significant traumatic brain injury.  Prior ASMs: Lacosamide , oxcarbazepine , Dilantin   Diagnostic Data: Repeat EEG in October 2019 showed left temporal slowing.   Follow-up MRI brain and MRA head done in 08/2019 did not show any acute changes. There was encephalomalacia in the inferomedial left frontal lobe related to prior hemorrhage, diffuse siderosis in the ventricular ependyma, cerebellar folia, and brainstem related to remote subarachnoid hemorrhage. Linear gliosis and a small amount of chronic blood products in the right frontal lobe are unchanged and related to previous ventriculostomy catheter placement. There was a small chronic cortical infarct laterally in the left occipital lobe. There is near complete left ICA signal loss due to artifact from stent and aneurysm coils, no residual aneurysm present. No new aneurysm seen.  Neuropsychological testing in 05/2020 with a diagnosis of Mild Neurocognitive Disorder. Results were not lateralizing and etiology likely multifactorial with primary cause of dysfunction related to her history of aneurysm and other cerebrovascular changes, moderate anxiety.    PAST MEDICAL HISTORY: Past Medical History:  Diagnosis Date   Acute encephalopathy    Acute respiratory failure    Adjustment disorder with depressed mood 08/30/2017   Aneurysm of internal carotid artery (ICA)  04/09/2017   Benign essential hypertension 12/31/2012   She will get back on the metoprolol  to keep her BP <130/<80.  She will start with 1/2 tab twice a day then go to 1 tab twice a day if needed.   GERD (gastroesophageal reflux disease)    Hydrocephalus    Hyperlipidemia 11/24/2018   Her LDL is much higher than it has been. Another question is whether or not her neurologist wants her to be on a statin and how low does she want her LDL to be ?? < 100; <70.   Hypoalbuminemia due to protein-calorie malnutrition    Hypokalemia    Hypoxemia    Migraine headaches 06/16/2008   Mild neurocognitive disorder due to multiple etiologies 05/25/2020   PEG (percutaneous endoscopic gastrostomy) status    Seizures    Related to ICA aneurysm; None reported since 2019   SIRS (systemic inflammatory response syndrome)    Status epilepticus    Related to ICA aneurysm   Subarachnoid hemorrhage    Related to ruptured ICA aneurysm   Syncope and collapse     MEDICATIONS: Current Outpatient Medications on File Prior to Visit  Medication Sig Dispense Refill   acetaminophen  (TYLENOL ) 325 MG tablet Take 2 tablets (650 mg total) by mouth every 6 (six) hours as needed for fever.     amLODipine (NORVASC) 5 MG tablet Take 5 mg by mouth daily.     levETIRAcetam  (KEPPRA ) 750 MG tablet Take 1 tablet (750 mg total) by mouth 2 (two) times daily. 180 tablet 3   metoprolol  succinate (TOPROL -XL) 100 MG 24 hr tablet Take 100 mg by mouth daily.     Multiple Vitamin (MULTIVITAMIN WITH MINERALS) TABS tablet Take 1 tablet by mouth daily. 30 tablet 0   SUMAtriptan  (IMITREX )  100 MG tablet Take 1 tablet at onset of migraine. Do not take more than 3 a week. 10 tablet 6   No current facility-administered medications on file prior to visit.    ALLERGIES: No Known Allergies  FAMILY HISTORY: Family History  Problem Relation Age of Onset   Diabetes Mother    Hypertension Mother    Hyperlipidemia Mother    Dementia Mother     Cancer Father        colon cancer   Colon polyps Father    Colon cancer Father    Diabetes Maternal Grandmother    Hyperlipidemia Paternal Grandfather    Seizures Cousin    Esophageal cancer Neg Hx    Stomach cancer Neg Hx    Rectal cancer Neg Hx     SOCIAL HISTORY: Social History   Socioeconomic History   Marital status: Married    Spouse name: Not on file   Number of children: Not on file   Years of education: 13   Highest education level: Some college, no degree  Occupational History   Occupation: Unemployed    Comment: [employer] prior to ICA aneurysm  Tobacco Use   Smoking status: Never   Smokeless tobacco: Never  Vaping Use   Vaping status: Never Used  Substance and Sexual Activity   Alcohol use: Yes    Comment: occ   Drug use: No   Sexual activity: Yes  Other Topics Concern   Not on file  Social History Narrative   Marital Status: Married Josefina Nian)   Children:  Son (1) Daughter (1)    Pets: None   Living Situation: Lives with husband and children.   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   Overall Financial Resource Strain (CARDIA)    Difficulty of Paying Living Expenses: Patient refused  Food Insecurity: High Risk (06/26/2023)   [facility] from [facility]   Hunger Vital Sign    Within the past 12 months, you worried that your food would run out before you got money to buy more: Often true    Within the past 12 months, the food you bought just didn't last and you didn't have money to get more. : Sometimes true  Transportation Needs: Not on file (06/26/2023)  Physical Activity: Unknown (12/04/2020)    Received from [facility] visits prior to 11/16/2022.   Exercise Vital Sign    On average, how many days per week do you engage in moderate to strenuous exercise (like a brisk walk)?: 2 days    Minutes of Exercise per Session: Not on file  Stress: No Stress Concern Present (12/04/2020)   Received from [facility] visits prior to 11/16/2022.   [employer] of Occupational Health - Occupational Stress Questionnaire    Feeling of Stress : Only a little  Social Connections: Unknown (12/04/2020)   Received from [facility] visits prior to 11/16/2022.   Social Connection and Isolation Panel    In a typical week, how many  times do you talk on the phone with family, friends, or neighbors?: More than three times a week    How often do you get together with friends or relatives?: Once a week    How often do you attend church or religious services?: Patient refused    Do you belong to any clubs or organizations such as church groups, unions, fraternal or athletic groups, or school groups?: No    How often do you attend meetings of the clubs or organizations you belong to?: Never    Are you married, widowed, divorced, separated, never married, or living with a partner?: Married  Intimate Partner Violence: Unknown (12/04/2020)   Received from [facility] visits prior to 11/16/2022.   Humiliation, Afraid, Rape, and Kick questionnaire    Within the last year, have you been afraid of your partner or ex-partner?: Patient refused    Within the last year, have you been humiliated or emotionally abused in other ways by your partner or ex-partner?: Patient refused    Within the last year, have you been kicked, hit, slapped, or otherwise physically hurt by your partner or ex-partner?: Patient refused    Within the last year, have you been raped or forced to have any kind of sexual activity by your partner or ex-partner?: Patient  refused     PHYSICAL EXAM: Vitals:   03/03/24 1127  BP: (!) 140/83  Pulse: 62  SpO2: 100%   General: No acute distress Head:  Normocephalic/atraumatic Skin/Extremities: No rash, no edema Neurological Exam: alert and awake. No aphasia or dysarthria. Fund of knowledge is appropriate. Attention and concentration are normal.   Cranial nerves: Pupils equal, round. Extraocular movements intact with no nystagmus. Visual fields full.  No facial asymmetry.  Motor: Bulk and tone normal, muscle strength 5/5 throughout with no pronator drift.   Finger to nose testing intact.  Gait narrow-based and steady, able to tandem walk adequately.  Romberg negative.   IMPRESSION: This is a very pleasant 53 yo RH woman with a history of subarachnoid hemorrhage s/p coil embolization of supraclinoid left ICA aneurysm in June 2018 with recurrence, who underwent elective aneurysmal treatment with flow diversion last 05/15/18. Hospitalization was complicated by status epilepticus arising from the left hemisphere requiring multiple AEDs. She has been seizure-free since 2019 and we have successfully weaned off 3 ASMs with no recurrence. Continue Levetiracetam  750mg  BID. She continues to have cognitive issues. Migraines controlled, she has prn Imitrex  for rescue. She is reporting continued weight issues despite diet and exercise and will be referred to [facility]. She is aware of [place] driving laws to stop driving after a seizure until 6 months seizure-free. Follow-up in 1 year, call for any changes.   Thank you for allowing me to participate in her care.  Please do not hesitate to call for any questions or concerns.    Rayfield Cairo, M.D.   CC: Dr. Camilo Cella

## 2024-03-03 NOTE — Patient Instructions (Signed)
 Always good to see you.  Continue Keppra  (Levetiracetam ) 750mg  twice a day  2. Referral will be sent to [facility]  3. Follow-up in 1 year, call for any changes.   Seizure Precautions: 1. If medication has been prescribed for you to prevent seizures, take it exactly as directed.  Do not stop taking the medicine without talking to your doctor first, even if you have not had a seizure in a long time.   2. Avoid activities in which a seizure would cause danger to yourself or to others.  Don't operate dangerous machinery, swim alone, or climb in high or dangerous places, such as on ladders, roofs, or girders.  Do not drive unless your doctor says you may.  3. If you have any warning that you may have a seizure, lay down in a safe place where you can't hurt yourself.    4.  No driving for 6 months from last seizure, as per [place]  state law.   Please refer to the following link on the Epilepsy Foundation of America's website for more information: http://www.epilepsyfoundation.org/answerplace/Social/driving/drivingu.cfm   5.  Maintain good sleep hygiene. Avoid alcohol.  6.  Contact your doctor if you have any problems that may be related to the medicine you are taking.  7.  Call 911 and bring the patient back to the ED if:        A.  The seizure lasts longer than 5 minutes.       B.  The patient doesn't awaken shortly after the seizure  C.  The patient has new problems such as difficulty seeing, speaking or moving  D.  The patient was injured during the seizure  E.  The patient has a temperature over 102 F (39C)  F.  The patient vomited and now is having trouble breathing

## 2024-03-08 ENCOUNTER — Telehealth: Payer: Self-pay | Admitting: Neurology

## 2024-03-08 DIAGNOSIS — Z0279 Encounter for issue of other medical certificate: Secondary | ICD-10-CM

## 2024-03-08 NOTE — Telephone Encounter (Signed)
 Done, thanks

## 2024-03-08 NOTE — Telephone Encounter (Signed)
 Paperwork faxed copy placed up front

## 2024-03-08 NOTE — Telephone Encounter (Signed)
 Pt. Paid for disability paper work, would like copy up front and faxed to [employer] (sticky note on form) inbox

## 2024-03-11 ENCOUNTER — Encounter (INDEPENDENT_AMBULATORY_CARE_PROVIDER_SITE_OTHER): Payer: Self-pay

## 2024-04-05 ENCOUNTER — Encounter: Payer: Self-pay | Admitting: Nurse Practitioner

## 2024-04-05 ENCOUNTER — Ambulatory Visit (INDEPENDENT_AMBULATORY_CARE_PROVIDER_SITE_OTHER): Admitting: Nurse Practitioner

## 2024-04-05 VITALS — BP 135/81 | HR 68 | Temp 98.1°F | Ht 63.0 in | Wt 229.0 lb

## 2024-04-05 DIAGNOSIS — I10 Essential (primary) hypertension: Secondary | ICD-10-CM | POA: Diagnosis not present

## 2024-04-05 DIAGNOSIS — E7849 Other hyperlipidemia: Secondary | ICD-10-CM | POA: Diagnosis not present

## 2024-04-05 DIAGNOSIS — E66813 Obesity, class 3: Secondary | ICD-10-CM | POA: Diagnosis not present

## 2024-04-05 DIAGNOSIS — E559 Vitamin D deficiency, unspecified: Secondary | ICD-10-CM | POA: Diagnosis not present

## 2024-04-05 DIAGNOSIS — Z6841 Body Mass Index (BMI) 40.0 and over, adult: Secondary | ICD-10-CM

## 2024-04-05 NOTE — Progress Notes (Signed)
 Office: [phone]  /  Fax: [phone]   Initial Visit  Carly [name] was seen in clinic today to evaluate for obesity. She is interested in losing weight to improve overall health and reduce the risk of weight related complications. She presents today to review program treatment options, initial physical assessment, and evaluation.     She was referred by: PCP  When asked what else they would like to accomplish? She states: Adopt a healthier eating pattern and lifestyle, Improve energy levels and physical activity, Improve existing medical conditions, and Improve quality of life   When asked how has your weight affected you? She states: Contributed to medical problems, Contributed to orthopedic problems or mobility issues, Having fatigue, and Having poor endurance  Some associated conditions: Hypertension, migraines, GERD, IFG, hydrocephalus, seizures, CKD, tachycardia, vitamin D  deficiency, history of subarachnoid hemorrhage from ruptured aneurysm, hyperlipidemia  Contributing factors: family history of obesity, use of obesogenic medications: Contraceptives or hormonal therapy, chronic skipping of meals, menopause, multiple weight loss attempts in the past, and sedentary job  Weight promoting medications identified: Contraceptives or hormonal therapy  Current nutrition plan: None  Current level of physical activity: Silver [employer]neakers at [employer] 3 days per week  Current or previous pharmacotherapy: None  Response to medication: Never tried medications   Past medical history includes:   Past Medical History:  Diagnosis Date   Acute encephalopathy    Acute respiratory failure    Adjustment disorder with depressed mood 08/30/2017   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   Benign essential hypertension 12/31/2012   She will get back on the metoprolol  to keep her BP <130/<80.  She will start with 1/2 tab twice a day then go to 1 tab twice a day if needed.    GERD (gastroesophageal reflux disease)    Hydrocephalus    Hyperlipidemia 11/24/2018   Her LDL is much higher than it has been. Another question is whether or not her neurologist wants her to be on a statin and how low does she want her LDL to be ?? < 100; <70.   Hypoalbuminemia due to protein-calorie malnutrition    Hypokalemia    Hypoxemia    Migraine headaches 06/16/2008   Mild neurocognitive disorder due to multiple etiologies 05/25/2020   PEG (percutaneous endoscopic gastrostomy) status    Seizures    Related to ICA aneurysm; None reported since 2019   SIRS (systemic inflammatory response syndrome)    Status epilepticus    Related to ICA aneurysm   Subarachnoid hemorrhage    Related to ruptured ICA aneurysm   Syncope and collapse      Objective:   BP 135/81   Pulse 68   Temp 98.1 F (36.7 C)   Ht 5' 3 (1.6 m)   Wt 229 lb (103.9 kg)   LMP  (LMP Unknown)   SpO2 100%   BMI 40.57 kg/m  She was weighed on the bioimpedance scale: Body mass index is 40.57 kg/m.  Peak Weight:248 lbs , Body Fat%:46.1%, Visceral Fat Rating:11, Weight trend over the last 12 months: Unchanged  General:  Alert, oriented and cooperative. Patient is in no acute distress.  Respiratory: Normal respiratory effort, no problems with respiration noted   Gait: able to ambulate independently  Mental Status: Normal mood and affect. Normal behavior. Normal judgment and thought content.   DIAGNOSTIC DATA REVIEWED:  BMET    Component Value Date/Time   NA 142 05/29/2018 0414   NA 143 02/25/2017 1352  K 3.5 05/29/2018 0414   CL 107 05/29/2018 0414   CO2 28 05/29/2018 0414   GLUCOSE 102 (H) 05/29/2018 0414   BUN 8 05/29/2018 0414   BUN 9 02/25/2017 1352   CREATININE 0.96 05/29/2018 0414   CALCIUM  8.3 (L) 05/29/2018 0414   GFRNONAA >60 05/29/2018 0414   GFRAA >60 05/29/2018 0414   Lab Results  Component Value Date   HGBA1C 5.9 (H) 07/23/2013   No results found for: INSULIN  CBC    Component  Value Date/Time   WBC 5.7 05/29/2018 0414   RBC 3.82 (L) 05/29/2018 0414   HGB 10.4 (L) 05/29/2018 0414   HCT 33.2 (L) 05/29/2018 0414   PLT 301 05/29/2018 0414   MCV 86.9 05/29/2018 0414   MCV 79.5 (A) 02/25/2017 1354   MCH 27.2 05/29/2018 0414   MCHC 31.3 05/29/2018 0414   RDW 14.1 05/29/2018 0414   RDW 14.3 07/23/2013 1316   Iron/TIBC/Ferritin/ %Sat No results found for: IRON, TIBC, FERRITIN, IRONPCTSAT Lipid Panel     Component Value Date/Time   CHOL 180 02/25/2017 1352   TRIG 271 (H) 05/17/2018 2133   HDL 55 02/25/2017 1352   CHOLHDL 3.3 02/25/2017 1352   CHOLHDL 4.5 CALC 12/02/2006 1522   VLDL 12 12/02/2006 1522   LDLCALC 109 (H) 02/25/2017 1352   Hepatic Function Panel     Component Value Date/Time   PROT 5.9 (L) 05/29/2018 0414   PROT 6.7 02/25/2017 1352   ALBUMIN 2.9 (L) 05/29/2018 0414   ALBUMIN 4.1 02/25/2017 1352   AST 40 05/29/2018 0414   ALT 26 05/29/2018 0414   ALKPHOS 108 05/29/2018 0414   BILITOT 0.4 05/29/2018 0414   BILITOT 0.2 02/25/2017 1352   BILIDIR 0.1 12/02/2006 1522      Component Value Date/Time   TSH 1.410 07/23/2013 1316     Assessment and Plan:   Hypertension, unspecified type Continue follow-up with PCP.  Continue medications as directed.  Other hyperlipidemia Continue follow-up with PCP.  Vitamin D  deficiency Continue follow-up with PCP.  Obesity, Class III, BMI 40-49.9 (morbid obesity)   She was recently prescribed Wegovy by her PCP.  She is waiting to hear back from her PA and appeal.     Obesity Treatment / Action Plan:  Patient will work on garnering support from family and friends to begin weight loss journey. Will work on eliminating or reducing the presence of highly palatable, calorie dense foods in the home. Will complete provided nutritional and psychosocial assessment questionnaire before the next appointment. Will be scheduled for indirect calorimetry to determine resting energy expenditure in a  fasting state.  This will allow us  to create a reduced calorie, high-protein meal plan to promote loss of fat mass while preserving muscle mass. Counseled on the health benefits of losing 5%-15% of total body weight. Was counseled on nutritional approaches to weight loss and benefits of reducing processed foods and consuming plant-based foods and high quality protein as part of nutritional weight management. Was counseled on pharmacotherapy and role as an adjunct in weight management.   Obesity Education Performed Today:  She was weighed on the bioimpedance scale and results were discussed and documented in the synopsis.  We discussed obesity as a disease and the importance of a more detailed evaluation of all the factors contributing to the disease.  We discussed the importance of long term lifestyle changes which include nutrition, exercise and behavioral modifications as well as the importance of customizing this to her specific health and  social needs.  We discussed the benefits of reaching a healthier weight to alleviate the symptoms of existing conditions and reduce the risks of the biomechanical, metabolic and psychological effects of obesity.  Carly [name] appears to be in the action stage of change and states they are ready to start intensive lifestyle modifications and behavioral modifications.  30 minutes was spent today on this visit including the above counseling, pre-visit chart review, and post-visit documentation.  Reviewed by clinician on day of visit: allergies, medications, problem list, medical history, surgical history, family history, social history, and previous encounter notes pertinent to obesity diagnosis.    Corean SAUNDERS Muslima Toppins FNP-C

## 2024-05-27 ENCOUNTER — Encounter: Payer: Self-pay | Admitting: Family Medicine

## 2024-05-27 ENCOUNTER — Ambulatory Visit: Admitting: Family Medicine

## 2024-05-27 VITALS — BP 156/77 | HR 62 | Temp 98.4°F | Ht 63.0 in | Wt 235.0 lb

## 2024-05-27 DIAGNOSIS — E7800 Pure hypercholesterolemia, unspecified: Secondary | ICD-10-CM

## 2024-05-27 DIAGNOSIS — R0602 Shortness of breath: Secondary | ICD-10-CM | POA: Diagnosis not present

## 2024-05-27 DIAGNOSIS — R5383 Other fatigue: Secondary | ICD-10-CM

## 2024-05-27 DIAGNOSIS — E66813 Obesity, class 3: Secondary | ICD-10-CM

## 2024-05-27 DIAGNOSIS — G40802 Other epilepsy, not intractable, without status epilepticus: Secondary | ICD-10-CM

## 2024-05-27 DIAGNOSIS — I4510 Unspecified right bundle-branch block: Secondary | ICD-10-CM | POA: Diagnosis not present

## 2024-05-27 DIAGNOSIS — I10 Essential (primary) hypertension: Secondary | ICD-10-CM

## 2024-05-27 DIAGNOSIS — Z6841 Body Mass Index (BMI) 40.0 and over, adult: Secondary | ICD-10-CM

## 2024-05-27 NOTE — Assessment & Plan Note (Signed)
 The 10-year ASCVD risk score (Arnett DK, et al., 2019) is: 9.6%   Values used to calculate the score:     Age: 53 years     Clincally relevant sex: Female     Is Non-Hispanic African American: Yes     Diabetic: No     Tobacco smoker: No     Systolic Blood Pressure: 156 mmHg     Is BP treated: Yes     HDL Cholesterol: 56 mg/dL     Total Cholesterol: 204 mg/dL

## 2024-05-27 NOTE — Progress Notes (Signed)
 At a Glance:  Vitals Temp: 98.4 F (36.9 C) BP: (!) 156/77 Pulse Rate: 62 SpO2: 100 %   Anthropometric Measurements Height: 5' 3 (1.6 m) Weight: 235 lb (106.6 kg) BMI (Calculated): 41.64 Starting Weight: 235lb Peak Weight: 248lb   Body Composition  Body Fat %: 47.1 % Fat Mass (lbs): 110.8 lbs Muscle Mass (lbs): 118.2 lbs Total Body Water  (lbs): 84.8 lbs Visceral Fat Rating : 15   Other Clinical Data RMR: 1440 Fasting: Yes Labs: Yes Today's Visit #: 1 Starting Date: 05/27/24    EKG: HR 58 BPM, RBBB (new)  Indirect Calorimeter completed today shows a VO2 of 210 and a REE of 1440.  Her calculated basal metabolic rate is 8245 thus her basal metabolic rate is worse than expected.  Chief Complaint:  Obesity   Subjective:  Carly [name] (MR# [id number]) is a 53 y.o. female who presents for evaluation and treatment of obesity and related comorbidities.   Carly [name] is currently in the action stage of change and ready to dedicate time achieving and maintaining a healthier weight. Carly [name] is interested in becoming our patient and working on intensive lifestyle modifications including (but not limited to) diet and exercise for weight loss.  Carly [name] has been struggling with her weight. She has been unsuccessful in either losing weight, maintaining weight loss, or reaching her healthy weight goal.  She lives w/ her husband and her daughter.  She is disabled with a hx of brain aneurysm s/p brain surgery in 2018.  She is managed by Dr Georjean.  She denies large portion sizes at mealtime and denies sugar cravings.  She snacks on crackers and usually skips breakfast.  She denies being a night snacker and has been trying to stick to a pescatarian diet.    Carly [name]'s habits were reviewed today and are as follows: Her family eats meals together, her desired weight loss is 70 lb, she started gaining weight in her 41s, she has significant food cravings issues, she skips meals frequently, and she  struggles with emotional eating.   Other Fatigue Carly [name] admits to daytime somnolence and admits to waking up still tired. Patient has a history of symptoms of hypertension. Margalit generally gets 5 or 6 hours of sleep per night, and states that she has difficulty falling asleep. Snoring is not present. Apneic episodes are not present. Epworth Sleepiness Score is 6.   Shortness of Breath Carly [name] notes increasing shortness of breath with exercising and seems to be worsening over time with weight gain. She notes getting out of breath sooner with activity than she used to. This has gotten worse recently. Carly [name] denies shortness of breath at rest or orthopnea.   Depression Screen Carly [name]'s Food and Mood (modified PHQ-9) score was 3.     04/30/2017   12:10 PM  Depression screen PHQ 2/9  Decreased Interest 1  Down, Depressed, Hopeless 1  PHQ - 2 Score 2  Altered sleeping 2  Tired, decreased energy 2  Change in appetite 3  Feeling bad or failure about yourself  1  Trouble concentrating 3  Moving slowly or fidgety/restless 3  Suicidal thoughts 0  PHQ-9 Score 16  Difficult doing work/chores Somewhat difficult     Assessment and Plan:   Other Fatigue Carly [name] does feel that her weight is causing her energy to be lower than it should be. Fatigue may be related to obesity, depression or many other causes. Labs will be ordered, and in the meanwhile, Carly [name] will focus on self care  including making healthy food choices, increasing physical activity and focusing on stress reduction.  Shortness of Breath Carly [name] does feel that she gets out of breath more easily that she used to when she exercises. Carly [name]'s shortness of breath appears to be obesity related and exercise induced. She has agreed to work on weight loss and gradually increase exercise to treat her exercise induced shortness of breath. Will continue to monitor closely.   Problem List Items Addressed This Visit     Pure hypercholesterolemia Lab  Results  Component Value Date   CHOL 180 02/25/2017   HDL 55 02/25/2017   LDLCALC 109 (H) 02/25/2017   TRIG 271 (H) 05/17/2018   CHOLHDL 3.3 02/25/2017   03/25/24 FLP: Tchol 204, TG 56, LDL 128, She is not on statin therapy Begin prescribed pescatarian meal plan, low in sugar and saturated fat Begin active plan for weight reduction She has never had coronary calcium  testing and would be a good candidate for advanced screening   Relevant Orders   Ambulatory referral to Cardiology   Other Visit Diagnoses       SOBOE (shortness of breath on exertion)    -  Primary     Other fatigue       Relevant Orders   EKG 12-Lead (Completed)   VITAMIN D  25 Hydroxy (Vit-D Deficiency, Fractures)   TSH   T4, free   T3   Insulin , random   Hemoglobin A1c   Folate   Vitamin B12     Right bundle branch block     New finding.  Compared to EKG 05/17/18 showing NSR   Relevant Orders   Ambulatory referral to Cardiology     Class 3 severe obesity due to excess calories with serious comorbidity and body mass index (BMI) of 40.0 to 44.9 in adult          Hypertension, unspecified type     BP is high today.  She has not yet taken her anti hypertensive meds this morning.  She does take metoprolol  XL 100 mg daily and amlodopine 5 mg daily.  Continue current meds.  Avoid use of stimulant type AOMs.      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 I recommend Carly [name] begin the structured treatment plan as follows:  She has agreed to [employer] ~1300 kcal/day for caloric deficit  Exercise goals: All adults should avoid inactivity. Some activity is better than none, and adults who participate in any amount of physical activity, gain some health benefits.  Behavioral modification strategies:increasing lean  protein intake, increase H2O intake, decrease liquid calories, increase high fiber foods, decreasing eating out, no skipping meals, meal planning and cooking strategies, keeping healthy foods in the home, planning for success, and decrease junk food   She was informed of the importance of frequent follow-up visits to maximize her success with intensive lifestyle modifications for her multiple health conditions. She was informed we would discuss her lab results at her next visit unless there is a critical issue that needs to be addressed sooner. Carly [name] agreed to keep her next visit at the agreed upon time to discuss these results.  Objective:  General: Cooperative, alert, well developed, in no acute distress. HEENT: Conjunctivae and lids unremarkable. Cardiovascular: Regular  rhythm.  Lungs: Normal work of breathing. Neurologic: No focal deficits.   Lab Results  Component Value Date   CREATININE 0.96 05/29/2018   BUN 8 05/29/2018   NA 142 05/29/2018   K 3.5 05/29/2018   CL 107 05/29/2018   CO2 28 05/29/2018   Lab Results  Component Value Date   ALT 26 05/29/2018   AST 40 05/29/2018   ALKPHOS 108 05/29/2018   BILITOT 0.4 05/29/2018   Lab Results  Component Value Date   HGBA1C 5.9 (H) 07/23/2013   No results found for: INSULIN  Lab Results  Component Value Date   TSH 1.410 07/23/2013   Lab Results  Component Value Date   CHOL 180 02/25/2017   HDL 55 02/25/2017   LDLCALC 109 (H) 02/25/2017   TRIG 271 (H) 05/17/2018   CHOLHDL 3.3 02/25/2017   Lab Results  Component Value Date   WBC 5.7 05/29/2018   HGB 10.4 (L) 05/29/2018   HCT 33.2 (L) 05/29/2018   MCV 86.9 05/29/2018   PLT 301 05/29/2018   No results found for: IRON, TIBC, FERRITIN  Attestation Statements:  Reviewed by clinician on day of visit: allergies, medications, problem list, medical history, surgical history, family history, social history, and previous encounter notes.  Time spent on visit  including pre-visit chart review and post-visit charting and face- to face care including nutritional counseling, review of EKG, interpretation of body composition scale and indirect calorimetry and nutrition prescription  was 40 minutes.   Darice Haddock, D.O. DABFM, DABOM [facility] [address] [place], [place] [address] [phone]

## 2024-05-28 LAB — INSULIN, RANDOM: INSULIN: 21.2 u[IU]/mL (ref 2.6–24.9)

## 2024-05-28 LAB — T4, FREE: Free T4: 1.27 ng/dL (ref 0.82–1.77)

## 2024-05-28 LAB — HEMOGLOBIN A1C
Est. average glucose Bld gHb Est-mCnc: 126 mg/dL
Hgb A1c MFr Bld: 6 % — ABNORMAL HIGH (ref 4.8–5.6)

## 2024-05-28 LAB — T3: T3, Total: 114 ng/dL (ref 71–180)

## 2024-05-28 LAB — VITAMIN B12: Vitamin B-12: >2000 pg/mL — ABNORMAL HIGH (ref 232–1245)

## 2024-05-28 LAB — VITAMIN D 25 HYDROXY (VIT D DEFICIENCY, FRACTURES): Vit D, 25-Hydroxy: 79.9 ng/mL (ref 30.0–100.0)

## 2024-05-28 LAB — FOLATE: Folate: >20 ng/mL (ref >3.0)

## 2024-05-28 LAB — TSH: TSH: 1.56 u[IU]/mL (ref 0.450–4.500)

## 2024-05-31 ENCOUNTER — Ambulatory Visit: Payer: Self-pay | Admitting: Family Medicine

## 2024-06-09 ENCOUNTER — Telehealth: Payer: Self-pay | Admitting: Family Medicine

## 2024-06-09 NOTE — Telephone Encounter (Signed)
 N/a

## 2024-06-10 ENCOUNTER — Ambulatory Visit (INDEPENDENT_AMBULATORY_CARE_PROVIDER_SITE_OTHER): Admitting: Family Medicine

## 2024-06-10 ENCOUNTER — Encounter: Payer: Self-pay | Admitting: Family Medicine

## 2024-06-10 VITALS — BP 143/79 | HR 60 | Temp 98.2°F | Ht 63.0 in | Wt 228.0 lb

## 2024-06-10 DIAGNOSIS — N1831 Chronic kidney disease, stage 3a: Secondary | ICD-10-CM

## 2024-06-10 DIAGNOSIS — I4510 Unspecified right bundle-branch block: Secondary | ICD-10-CM

## 2024-06-10 DIAGNOSIS — I129 Hypertensive chronic kidney disease with stage 1 through stage 4 chronic kidney disease, or unspecified chronic kidney disease: Secondary | ICD-10-CM | POA: Diagnosis not present

## 2024-06-10 DIAGNOSIS — E88819 Insulin resistance, unspecified: Secondary | ICD-10-CM | POA: Diagnosis not present

## 2024-06-10 DIAGNOSIS — Z6841 Body Mass Index (BMI) 40.0 and over, adult: Secondary | ICD-10-CM

## 2024-06-10 DIAGNOSIS — I10 Essential (primary) hypertension: Secondary | ICD-10-CM

## 2024-06-10 DIAGNOSIS — G40802 Other epilepsy, not intractable, without status epilepticus: Secondary | ICD-10-CM

## 2024-06-10 DIAGNOSIS — E66813 Obesity, class 3: Secondary | ICD-10-CM

## 2024-06-10 DIAGNOSIS — R7303 Prediabetes: Secondary | ICD-10-CM

## 2024-06-10 MED ORDER — METFORMIN HCL ER 500 MG PO TB24: 500.0000 mg | ORAL_TABLET | Freq: Every day | ORAL | 0 refills | Status: DC

## 2024-06-10 NOTE — Progress Notes (Signed)
 Office: [phone]  /  Fax: [phone]  WEIGHT SUMMARY AND BIOMETRICS  Starting Date: 05/27/24  Starting Weight: 235lb   Weight Lost Since Last Visit: 7lb   Vitals Temp: 98.2 F (36.8 C) BP: (!) 143/79 Pulse Rate: 60 SpO2: 98 %   Body Composition  Body Fat %: 45.5 % Fat Mass (lbs): 104.2 lbs Muscle Mass (lbs): 118.4 lbs Total Body Water  (lbs): 81.8 lbs Visceral Fat Rating : 14     HPI  Chief Complaint: OBESITY  Carly [name] is here to discuss her progress with her obesity treatment plan. She is on the the [facility] and states she is following her eating plan approximately 97 % of the time. She states she is exercising 30 minutes 3 times per week.  Interval History:  Since last office visit she is down 7 lb She is logging her intake aiming for 1300 cal/ day with Pescatarian diet She is no longer meal skipping Denies cravings for sugar, occasional salt cravings She usually goes to Silver Sneakers classes at [employer] 2-3 x a week She could add in more regular walking or ride the bike She has cut out cheez its  Pharmacotherapy: none  PHYSICAL EXAM:  Blood pressure (!) 143/79, pulse 60, temperature 98.2 F (36.8 C), height 5' 3 (1.6 m), weight 228 lb (103.4 kg), SpO2 98%. Body mass index is 40.39 kg/m.  General: She is overweight, cooperative, alert, well developed, and in no acute distress. PSYCH: Has normal mood, affect and thought process.   Lungs: Normal breathing effort, no conversational dyspnea.   ASSESSMENT AND PLAN  TREATMENT PLAN FOR OBESITY:  Recommended Dietary Goals  Carly [name] is currently in the action stage of change. As such, her goal is to continue weight management plan. She has agreed to keeping a food journal and adhering to recommended goals of 1300 calories and 90+ g of  protein and the Pescatarian Plan. - educated on use of Chat [employer] app to create new meal plans  Behavioral Intervention  We discussed the following Behavioral  Modification Strategies today: increasing lean protein intake to established goals, increasing fiber rich foods, increasing water  intake , work on meal planning and preparation, keeping healthy foods at home, avoiding temptations and identifying enticing environmental cues, and planning for success.  Additional resources provided today: NA  Recommended Physical Activity Goals  [name] has been advised to work up to 150 minutes of moderate intensity aerobic activity a week and strengthening exercises 2-3 times per week for cardiovascular health, weight loss maintenance and preservation of muscle mass.   She has agreed to Increase the intensity, frequency or duration of aerobic exercises   Reviewed exercise change goals on AVS  Pharmacotherapy changes for the treatment of obesity: begin Metformin  XR 500 mg daily w/ food  ASSOCIATED CONDITIONS ADDRESSED TODAY  Insulin  resistance Fasting insulin  elevated at 21.2 on 05/27/2024 labs consistent with insulin  resistance She has never used metformin .  She has started to reduce her intake of ultra processed refined carbohydrates and added sugar.  She is doing some mild exercise with Silver sneakers 2-3 times a week.  We discussed adding in more walking and bike riding with a goal of 20 minutes 3 times a week.  Begin use of metformin  XR 500 mg once daily with breakfast.  Recheck renal function in the next 1 to 2 months.  -     metFORMIN  HCl ER; Take 1 tablet (500 mg total) by mouth daily with breakfast.  Dispense: 30 tablet; Refill:  0  Prediabetes Lab Results  Component Value Date   HGBA1C 6.0 (H) 05/27/2024  Reviewed lab results with patient.  She is in the prediabetic range.  Continue working on weight reduction, prescribed diet and begin use of metformin . -     metFORMIN  HCl ER; Take 1 tablet (500 mg total) by mouth daily with breakfast.  Dispense: 30 tablet; Refill: 0  Right bundle branch block She has an upcoming visit with cardiology scheduled  for 07/20/24.  She remains asymptomatic.  CKD stage 3a, GFR 45-59 ml/min (HCC) Last chemistry panel reviewed from 03/25/2024 showing a creatinine of 1.15 and an estimated GFR of 57.  She does have underlying hypertension.  She denies intake of NSAIDs.  She is hydrating better with water .  She has follow-up with her PCP for 06/28/2024  Hypertension, unspecified type Her blood pressure has improved.  She reports good compliance taking amlodipine 5 mg daily and metoprolol  100 mg once daily.  She denies chest pain or headache.  She has a home blood pressure cuff but has not been using.  Advised her to check a resting blood pressure and log these numbers once a week.  Other epilepsy without status epilepticus, not intractable (HCC) She is managed by Dr. Georjean on Keppra  750 mg once daily.  This was initiated after her brain aneurysm surgery in 2018 and she has not had any additional seizures.  Class 3 severe obesity due to excess calories with serious comorbidity and body mass index (BMI) of 40.0 to 44.9 in adult Doing great with weight reduction, maintaining most of her lean body mass with a caloric deficit, dietary logging and the introduction of more regular physical activity.     She was informed of the importance of frequent follow up visits to maximize her success with intensive lifestyle modifications for her multiple health conditions.   ATTESTASTION STATEMENTS:  Reviewed by clinician on day of visit: allergies, medications, problem list, medical history, surgical history, family history, social history, and previous encounter notes pertinent to obesity diagnosis.   I have personally spent 40 minutes total time today in preparation, patient care, nutritional counseling and education,  and documentation for this visit, including the following: review of most recent clinical lab tests, prescribing medications/ refilling medications, reviewing medical assistant documentation, review and  interpretation of bioimpedence results.     Darice Haddock, D.O. DABFM, DABOM [facility] [address] [place], [place] [address] [phone]

## 2024-06-10 NOTE — Patient Instructions (Addendum)
 Begin Metformin  XR 500 mg once daily with food  Check out Chat GPT app to create new meal plans: '1300 calorie Mediterranean diet meal plan' - if you pick different meals/ snacks, keep the calories constant  Goal for Silver Sneakers 3 days/ wk Try adding in 20 min of walking or bike 3 days/ wk

## 2024-07-02 ENCOUNTER — Other Ambulatory Visit: Payer: Self-pay | Admitting: Family Medicine

## 2024-07-02 DIAGNOSIS — R7303 Prediabetes: Secondary | ICD-10-CM

## 2024-07-02 DIAGNOSIS — E88819 Insulin resistance, unspecified: Secondary | ICD-10-CM

## 2024-07-06 ENCOUNTER — Ambulatory Visit (INDEPENDENT_AMBULATORY_CARE_PROVIDER_SITE_OTHER): Admitting: Family Medicine

## 2024-07-06 ENCOUNTER — Encounter: Payer: Self-pay | Admitting: Family Medicine

## 2024-07-06 VITALS — BP 156/76 | HR 58 | Temp 98.6°F | Ht 63.0 in | Wt 228.0 lb

## 2024-07-06 DIAGNOSIS — Z6841 Body Mass Index (BMI) 40.0 and over, adult: Secondary | ICD-10-CM

## 2024-07-06 DIAGNOSIS — R7303 Prediabetes: Secondary | ICD-10-CM | POA: Diagnosis not present

## 2024-07-06 DIAGNOSIS — I4510 Unspecified right bundle-branch block: Secondary | ICD-10-CM | POA: Diagnosis not present

## 2024-07-06 DIAGNOSIS — E66813 Obesity, class 3: Secondary | ICD-10-CM

## 2024-07-06 DIAGNOSIS — N1831 Chronic kidney disease, stage 3a: Secondary | ICD-10-CM | POA: Diagnosis not present

## 2024-07-06 DIAGNOSIS — I129 Hypertensive chronic kidney disease with stage 1 through stage 4 chronic kidney disease, or unspecified chronic kidney disease: Secondary | ICD-10-CM

## 2024-07-06 DIAGNOSIS — E88819 Insulin resistance, unspecified: Secondary | ICD-10-CM

## 2024-07-06 DIAGNOSIS — I10 Essential (primary) hypertension: Secondary | ICD-10-CM

## 2024-07-06 NOTE — Progress Notes (Signed)
 Office: [phone]  /  Fax: [phone]  WEIGHT SUMMARY AND BIOMETRICS  Starting Date: 05/27/24  Starting Weight: 235lb   Weight Lost Since Last Visit: 0lb   Vitals Temp: 98.6 F (37 C) BP: (!) 156/76 Pulse Rate: (!) 58 SpO2: 100 %   Body Composition  Body Fat %: 45.8 % Fat Mass (lbs): 104.6 lbs Muscle Mass (lbs): 117.8 lbs Total Body Water  (lbs): 84.4 lbs Visceral Fat Rating : 14   HPI  Chief Complaint: OBESITY  Carly [name] is here to discuss her progress with her obesity treatment plan. She is on the the [facility] and states she is following her eating plan approximately 80 % of the time. She states she is exercising 0 minutes 0 times per week.  Interval History:  Since last office visit she is is down 0 lb She is net down 7 lb in 1 month of medically supervised weight management She lost her mom since last visit to Parkinson's disease She admits to going back to meal skipping and did not go back to emotional eating Her husband has been supportive She has not yet started metformin   She has not yet started exercise but plan to get back into [employer] in [place] Plans to go back to silver sneakers  Pharmacotherapy: None She did pick up her prescription for metformin  500 mg XR once daily  PHYSICAL EXAM:  Blood pressure (!) 156/76, pulse (!) 58, temperature 98.6 F (37 C), height 5' 3 (1.6 m), weight 228 lb (103.4 kg), SpO2 100%. Body mass index is 40.39 kg/m.  General: She is overweight, cooperative, alert, well developed, and in no acute distress. PSYCH: Has normal mood, affect and thought process.   Lungs: Normal breathing effort, no conversational dyspnea.   ASSESSMENT AND PLAN  TREATMENT PLAN FOR OBESITY:  Recommended Dietary Goals  Carly [name] is currently in the action stage of change. As such, her goal is to continue weight management plan. She has agreed to the [employer]. She may track her calorie intake aiming for 1300 cal/day.  This  should include 90 to 100 g of protein daily  Behavioral Intervention  We discussed the following Behavioral Modification Strategies today: increasing lean protein intake to established goals, increasing fiber rich foods, increasing water  intake , work on meal planning and preparation, keeping healthy foods at home, work on managing stress, creating time for self-care and relaxation, avoiding temptations and identifying enticing environmental cues, continue to practice mindfulness when eating, and planning for success.  Additional resources provided today: NA  Recommended Physical Activity Goals  Carly [name] has been advised to work up to 150 minutes of moderate intensity aerobic activity a week and strengthening exercises 2-3 times per week for cardiovascular health, weight loss maintenance and preservation of muscle mass.   She has agreed to Think about enjoyable ways to increase daily physical activity and overcoming barriers to exercise and Increase physical activity in their day and reduce sedentary time (increase NEAT). Aim for gym exercise 2+ days a week including Silver sneakers classes and increasing walking time  Pharmacotherapy changes for the treatment of obesity: Begin prescription for metformin  XR 500 mg once daily as prescribed last visit  ASSOCIATED CONDITIONS ADDRESSED TODAY  Insulin  resistance Continue active plan for weight reduction, specifically reducing added sugar and starch intake while increasing exercise frequency.  Begin use of metformin  XR 500 mg once daily and obtain labs between now and next visit, fasting. -     Basic metabolic panel with GFR -  Insulin , random  CKD stage 3a, GFR 45-59 ml/min [facility])   Chemistry      Component Value Date/Time   NA 142 05/29/2018 0414   NA 143 02/25/2017 1352   K 3.5 05/29/2018 0414   CL 107 05/29/2018 0414   CO2 28 05/29/2018 0414   BUN 8 05/29/2018 0414   BUN 9 02/25/2017 1352   CREATININE 0.96 05/29/2018 0414       Component Value Date/Time   CALCIUM  8.3 (L) 05/29/2018 0414   ALKPHOS 108 05/29/2018 0414   AST 40 05/29/2018 0414   ALT 26 05/29/2018 0414   BILITOT 0.4 05/29/2018 0414   BILITOT 0.2 02/25/2017 1352    Stable renal function with known CKD stage IIIa.  Continue routine follow-up with PCP for further management.  Avoid use of nephrotoxic medications and work on hydrating well with water .  Right bundle branch block She has upcoming visit with cardiology scheduled for 07/20/2024.  She remains asymptomatic but her blood pressure is also elevated.  Continue current medications as listed.  Prediabetes Lab Results  Component Value Date   HGBA1C 6.0 (H) 05/27/2024  Look for improvements in prediabetes with further weight reduction, dietary change and regular exercise.  Proceed with starting metformin  XR 500 mg once daily.  Hypertension, unspecified type Blood pressure remains elevated.  She reports good compliance taking amlodipine 5 mg once daily, metoprolol  XL 100 mg once daily and is set up to see cardiology in early November.  She denies headache or chest pain.  She has not been checking her blood pressure at home but does have an ambulatory blood pressure cuff.  Recommend checking a resting blood pressure at home 2 days a week.  Continue all listed medications.  Class 3 severe obesity due to excess calories with serious comorbidity and body mass index (BMI) of 40.0 to 44.9 in adult (HCC) Stable.  Losing her mom since last visit has been a factor in her lack of progress.  She seems to be in good spirits and has a good support system and her husband and her siblings.  Continue to work on self-care, sleep reduction, sleep and good nutrition.     She was informed of the importance of frequent follow up visits to maximize her success with intensive lifestyle modifications for her multiple health conditions.   ATTESTASTION STATEMENTS:  Reviewed by clinician on day of visit: allergies,  medications, problem list, medical history, surgical history, family history, social history, and previous encounter notes pertinent to obesity diagnosis.   I have personally spent 32 minutes total time today in preparation, patient care, nutritional counseling and education,  and documentation for this visit, including the following: review of most recent clinical lab tests, prescribing medications/ refilling medications, reviewing medical assistant documentation, review and interpretation of bioimpedence results.     Darice Haddock, D.O. DABFM, DABOM [facility] [address] [place], [place] [address] [phone]

## 2024-07-11 ENCOUNTER — Other Ambulatory Visit: Payer: Self-pay | Admitting: Family Medicine

## 2024-07-11 DIAGNOSIS — R7303 Prediabetes: Secondary | ICD-10-CM

## 2024-07-11 DIAGNOSIS — E88819 Insulin resistance, unspecified: Secondary | ICD-10-CM

## 2024-07-19 DIAGNOSIS — Z86718 Personal history of other venous thrombosis and embolism: Secondary | ICD-10-CM | POA: Insufficient documentation

## 2024-07-19 DIAGNOSIS — N289 Disorder of kidney and ureter, unspecified: Secondary | ICD-10-CM | POA: Insufficient documentation

## 2024-07-19 DIAGNOSIS — F419 Anxiety disorder, unspecified: Secondary | ICD-10-CM | POA: Insufficient documentation

## 2024-07-19 DIAGNOSIS — M549 Dorsalgia, unspecified: Secondary | ICD-10-CM | POA: Insufficient documentation

## 2024-07-19 DIAGNOSIS — K5900 Constipation, unspecified: Secondary | ICD-10-CM | POA: Insufficient documentation

## 2024-07-20 ENCOUNTER — Ambulatory Visit: Attending: Cardiology | Admitting: Cardiology

## 2024-07-20 ENCOUNTER — Encounter: Payer: Self-pay | Admitting: Cardiology

## 2024-07-20 VITALS — BP 140/90 | HR 55 | Ht 63.0 in | Wt 234.1 lb

## 2024-07-20 DIAGNOSIS — E7800 Pure hypercholesterolemia, unspecified: Secondary | ICD-10-CM | POA: Diagnosis not present

## 2024-07-20 DIAGNOSIS — I10 Essential (primary) hypertension: Secondary | ICD-10-CM | POA: Diagnosis not present

## 2024-07-20 DIAGNOSIS — R011 Cardiac murmur, unspecified: Secondary | ICD-10-CM | POA: Diagnosis not present

## 2024-07-20 NOTE — Patient Instructions (Signed)
 Medication Instructions:  Your physician recommends that you continue on your current medications as directed. Please refer to the Current Medication list given to you today.  *If you need a refill on your cardiac medications before your next appointment, please call your pharmacy*   Lab Work: None ordered If you have labs (blood work) drawn today and your tests are completely normal, you will receive your results only by: MyChart Message (if you have MyChart) OR A paper copy in the mail If you have any lab test that is abnormal or we need to change your treatment, we will call you to review the results.  Testing/Procedures: Your physician has requested that you have an echocardiogram. Echocardiography is a painless test that uses sound waves to create images of your heart. It provides your doctor with information about the size and shape of your heart and how well your heart's chambers and valves are working. This procedure takes approximately one hour. There are no restrictions for this procedure. Please do NOT wear cologne, perfume, aftershave, or lotions (deodorant is allowed). Please arrive 15 minutes prior to your appointment time.  Please note: We ask at that you not bring children with you during ultrasound (echo/ vascular) testing. Due to room size and safety concerns, children are not allowed in the ultrasound rooms during exams. Our front office staff cannot provide observation of children in our lobby area while testing is being conducted. An adult accompanying a patient to their appointment will only be allowed in the ultrasound room at the discretion of the ultrasound technician under special circumstances. We apologize for any inconvenience.  We will order CT coronary calcium  score. It will cost $99.00 and is due at time of scan.  Please call to schedule.    [facility] [address] [place], [place] [address] [phone]  Follow-Up: At [facility], you and your health needs are our priority.  As part of our continuing mission to provide you with exceptional heart care, we have created designated Provider Care Teams.  These Care Teams include your primary Cardiologist (physician) and Advanced Practice Providers (APPs -  Physician Assistants and Nurse Practitioners) who all work together to provide you with the care you need, when you need it.  We recommend signing up for the patient portal called MyChart.  Sign up information is provided on this After Visit Summary.  MyChart is used to connect with patients for Virtual Visits (Telemedicine).  Patients are able to view lab/test results, encounter notes, upcoming appointments, etc.  Non-urgent messages can be sent to your provider as well.   To learn more about what you can do with MyChart, go to [email or web address].    Your next appointment:   12 month(s)  The format for your next appointment:   In Person  Provider:   Jennifer Crape, MD   Other Instructions Echocardiogram An echocardiogram is a test that uses sound waves (ultrasound) to produce images of the heart. Images from an echocardiogram can provide important information about: Heart size and shape. The size and thickness and movement of your heart's walls. Heart muscle function and strength. Heart valve function or if you have stenosis. Stenosis is when the heart valves are too narrow. If blood is flowing backward through the heart valves (regurgitation). A tumor or infectious growth around the heart valves. Areas of heart muscle that are not working well because of poor blood flow or injury from a heart attack. Aneurysm detection. An aneurysm  is a weak or damaged part of an artery wall. The wall bulges out from the normal force of blood pumping through the body. Tell a health care provider about: Any allergies you have. All medicines you are taking, including vitamins, herbs, eye drops, creams, and  over-the-counter medicines. Any blood disorders you have. Any surgeries you have had. Any medical conditions you have. Whether you are pregnant or may be pregnant. What are the risks? Generally, this is a safe test. However, problems may occur, including an allergic reaction to dye (contrast) that may be used during the test. What happens before the test? No specific preparation is needed. You may eat and drink normally. What happens during the test? You will take off your clothes from the waist up and put on a hospital gown. Electrodes or electrocardiogram (ECG)patches may be placed on your chest. The electrodes or patches are then connected to a device that monitors your heart rate and rhythm. You will lie down on a table for an ultrasound exam. A gel will be applied to your chest to help sound waves pass through your skin. A handheld device, called a transducer, will be pressed against your chest and moved over your heart. The transducer produces sound waves that travel to your heart and bounce back (or echo back) to the transducer. These sound waves will be captured in real-time and changed into images of your heart that can be viewed on a video monitor. The images will be recorded on a computer and reviewed by your health care provider. You may be asked to change positions or hold your breath for a short time. This makes it easier to get different views or better views of your heart. In some cases, you may receive contrast through an IV in one of your veins. This can improve the quality of the pictures from your heart. The procedure may vary among health care providers and hospitals.   What can I expect after the test? You may return to your normal, everyday life, including diet, activities, and medicines, unless your health care provider tells you not to do that. Follow these instructions at home: It is up to you to get the results of your test. Ask your health care provider, or the  department that is doing the test, when your results will be ready. Keep all follow-up visits. This is important. Summary An echocardiogram is a test that uses sound waves (ultrasound) to produce images of the heart. Images from an echocardiogram can provide important information about the size and shape of your heart, heart muscle function, heart valve function, and other possible heart problems. You do not need to do anything to prepare before this test. You may eat and drink normally. After the echocardiogram is completed, you may return to your normal, everyday life, unless your health care provider tells you not to do that. This information is not intended to replace advice given to you by your health care provider. Make sure you discuss any questions you have with your health care provider. Document Revised: 04/25/2020 Document Reviewed: 04/25/2020 Elsevier Patient Education  2021 Elsevier Inc.   Important Information About Sugar

## 2024-07-20 NOTE — Progress Notes (Signed)
 Cardiology Office Note:    Date:  07/20/2024   ID:  Carly [name], DOB [birth year], MRN [id number]  PCP:  Carly Thersia Jansky, MD  Cardiologist:  Carly JONELLE Crape, MD   Referring MD: Carly Carly BRAVO, DO    ASSESSMENT:    1. Pure hypercholesterolemia   2. Benign essential hypertension   3. Murmur, cardiac   4. Cardiac murmur   5. Morbid obesity (HCC)    PLAN:    In order of problems listed above:  Primary prevention stressed with the patient.  Importance of compliance with diet medication stressed and patient verbalized standing. Essential hypertension: Blood pressure stable and diet was emphasized.  Lifestyle modification urged.  She has not taken her afternoon medications.  She will keep a track of blood pressures at home.  Diet and salt intake issues and relaxation was discussed and she promises to do better.  She is going to keep a track of blood pressures at home and give it to us . Cardiac murmur: Echocardiogram will be done to assess murmur heard on auscultation. Mixed dyslipidemia: I will do a calcium  scoring CT scan for coronary restratification.  She is also a diet-controlled diabetic.  This will help me assess for lipid-lowering medications with statin therapy.  She is agreeable. Morbid obesity: Weight reduction stressed diet emphasized and she promises to do better.  She was advised to walk on a regular basis. Patient will be seen in follow-up appointment in 6 months or earlier if the patient has any concerns.    Medication Adjustments/Labs and Tests Ordered: Current medicines are reviewed at length with the patient today.  Concerns regarding medicines are outlined above.  Orders Placed This Encounter  Procedures   CT CARDIAC SCORING   EKG 12-Lead   ECHOCARDIOGRAM COMPLETE   No orders of the defined types were placed in this encounter.    History of Present Illness:    Carly [name] is a 53 y.o. female who is being seen today for the evaluation of cardiac  murmur and mixed dyslipidemia at the request of Carly [name], Carly BRAVO, DO.  Patient is a pleasant 53 year old female.  She has past medical history of essential hypertension, mixed dyslipidemia and diet-controlled diabetes mellitus.  She is post intracranial aneurysm repair surgery in the remote past.  She is morbidly obese and overall active but does not exercise on a regular basis.  She tells me that she used to practice slow down in the past few weeks.  She denies any chest pain orthopnea or PND with exercise.  At the time of my evaluation, the patient is alert awake oriented and in no distress.  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  She will start with 1/2 tab twice a day then go to 1 tab twice a day if needed.   Benign hypertension with chronic kidney disease, stage III (HCC) 07/02/2021   Stable with metoprolol  XL 100 mg per day.  GFR 57.  Increase water  intake.    Patient to return for labs.  Chest pressure 06/16/2008   Qualifier: Diagnosis of   By: Carly [name]         Constipation    Cystoid macular edema of left eye 10/31/2020   Debility    Decreased oral intake    Dislodged gastrostomy tube    Electrolyte imbalance 03/19/2017   Encounter for intubation    Essential hypertension 12/31/2012   Last Assessment & Plan:   Her BP is elevated today but she has not taken her metoprolol  yet this morning. She does report having elevate pressures at home.  I have asked her to check her BP twice a day (before meds) and to take those readings with her to her next neurology appointment.  I want her to  find out what number the neurologist prefers her to have (? <130/<80).     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 Another question is whether or not her neurologist wants her to be on a statin and how low does she want her LDL to be ?? < 100; <70.   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   Optic neuropathy, right 10/31/2020   PEG (percutaneous endoscopic gastrostomy) status    Plantar fasciitis, bilateral 08/23/2013   Prediabetes 11/12/2016   Pure hypercholesterolemia 11/12/2016   Seizures    Related to ICA aneurysm; None reported since 2019   Sepsis    SIRS (systemic inflammatory response syndrome)    Stage 3a chronic kidney disease (HCC) 10/28/2023   Status epilepticus    Related to ICA aneurysm   Subarachnoid hemorrhage    Related to ruptured ICA aneurysm   Syncope and collapse    Tachycardia    Tachypnea    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   History  of rupture 2018     Thrombocytopenic disorder 11/03/2023   Transaminitis    Vitamin D  deficiency     Past Surgical History:  Procedure Laterality Date   CESAREAN SECTION     ESOPHAGOGASTRODUODENOSCOPY N/A 04/01/2017   Procedure: ESOPHAGOGASTRODUODENOSCOPY (EGD);  Surgeon: Sebastian Moles, MD;  Location[facility] END[facility]COPY;  Service: General;  Laterality: N/A;   IR ANGIO INTRA EXTRACRAN SEL INTERNAL CAROTID UNI L MOD SED  05/15/2018   IR ANGIO INTRA EXTRACRAN SEL INTERNAL CAROTID UNI R MOD SED  03/03/2017   IR ANGIO VERTEBRAL SEL SUBCLAVIAN INNOMINATE BILAT MOD SED  02/10/2018  IR ANGIO VERTEBRAL SEL VERTEBRAL BILAT MOD SED  03/03/2017   IR ANGIO VERTEBRAL SEL VERTEBRAL BILAT MOD SED  02/10/2018   IR ANGIOGRAM FOLLOW UP STUDY  03/03/2017   IR ANGIOGRAM FOLLOW UP STUDY  03/03/2017   IR ANGIOGRAM FOLLOW UP STUDY  03/03/2017   IR ANGIOGRAM FOLLOW UP STUDY  03/03/2017   IR ANGIOGRAM FOLLOW UP STUDY  05/15/2018   IR REPLC GASTRO/COLONIC TUBE PERCUT W/FLUORO  04/15/2017   IR TRANSCATH/EMBOLIZ  03/03/2017   IR TRANSCATH/EMBOLIZ  05/15/2018   IR US  GUIDE VASC ACCESS RIGHT  05/15/2018   LAPAROSCOPY     in early 90s   PEG PLACEMENT N/A 04/01/2017   Procedure: PERCUTANEOUS ENDOSCOPIC GASTROSTOMY (PEG) PLACEMENT;  Surgeon: Sebastian Moles, MD;  Location: [facility] ENDOSCOPY;  Service: General;  Laterality: N/A;   RADIOLOGY WITH ANESTHESIA N/A 03/03/2017   Procedure: RADIOLOGY WITH ANESTHESIA;  Surgeon: Lanis Pupa, MD;  Location: [facility] OR;  Service: Radiology;  Laterality: N/A;   RADIOLOGY WITH ANESTHESIA N/A 05/15/2018   Procedure: Arteriogram, Embolization of recurrent aneurysm;  Surgeon: Lanis Pupa, MD;  Location: [facility] OR;  Service: Radiology;  Laterality: N/A;    Current Medications: Current Meds  Medication Sig   acetaminophen  (TYLENOL ) 325 MG tablet Take 2 tablets (650 mg total) by mouth every 6 (six) hours as needed for fever.   amLODipine (NORVASC) 5 MG tablet Take 5 mg by mouth daily.   ascorbic acid  (VITAMIN C) 250 MG tablet Take 250 mg by mouth daily.   Biotin 5000 MCG CAPS Take by mouth.   levETIRAcetam  (KEPPRA ) 750 MG tablet Take 1 tablet (750 mg total) by mouth 2 (two) times daily.   metoprolol  succinate (TOPROL -XL) 100 MG 24 hr tablet Take 100 mg by mouth daily.   Multiple Vitamin (MULTIVITAMIN WITH MINERALS) TABS tablet Take 1 tablet by mouth daily.   SUMAtriptan  (IMITREX ) 100 MG tablet Take 1 tablet at onset of migraine. Do not take more than 3 a week.     Allergies:   Patient has no known allergies.   Social History   Socioeconomic History   Marital status: Married    Spouse name: Not on file   Number of children: Not on file   Years of education: 13   Highest education level: Some college, no degree  Occupational History   Occupation: Unemployed    Comment: [employer] prior to ICA aneurysm  Tobacco Use   Smoking status: Never   Smokeless tobacco: Never  Vaping Use   Vaping status: Never Used  Substance and Sexual Activity   Alcohol use: Yes    Comment: occ   Drug use: No   Sexual activity: Yes  Other Topics Concern   Not on file  Social History Narrative   Marital Status: Married Astrid)   Children:  Son (1) Daughter (1)    Pets: None   Living Situation: Lives with husband and children.   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   Overall Financial Resource Strain (CARDIA)    Difficulty of  Paying Living Expenses: Patient refused  Food Insecurity: High Risk (06/26/2023)   [facility] from [facility]   Hunger Vital Sign    Within the past 12 months, you worried that your food would run out before you  got money to buy more: Often true    Within the past 12 months, the food you bought just didn't last and you didn't have money to get more. : Sometimes true  Transportation Needs: Not on file (06/26/2023)  Physical Activity: Unknown (12/04/2020)   Received from [facility] visits prior to 11/16/2022.   Exercise Vital Sign    On average, how many days per week do you engage in moderate to strenuous exercise (like a brisk walk)?: 2 days    Minutes of Exercise per Session: Not on file  Stress: No Stress Concern Present (12/04/2020)   Received from [facility] visits prior to 11/16/2022.   [employer] of Occupational Health - Occupational Stress Questionnaire    Feeling of Stress : Only a little  Social Connections: Unknown (12/04/2020)   Received from [facility] visits prior to 11/16/2022.   Social Connection and Isolation Panel    In a typical week, how many times do you talk on the phone with family, friends, or neighbors?: More than three times a week    How often do you get together with friends or relatives?: Once a week    How often do you attend church or religious services?: Patient refused    Do you belong to any clubs or organizations such as church groups, unions, fraternal or athletic groups, or school groups?: No    How often do you attend meetings of the clubs or organizations you belong to?: Never    Are you married, widowed, divorced, separated, never married, or living with a partner?: Married     Family History: The patient's family history includes Cancer in her father; Colon cancer in her father; Colon polyps in her father; Dementia in her mother; Diabetes in her maternal grandmother and mother; Hyperlipidemia in her mother and paternal grandfather; Hypertension in her mother; Kidney disease in her mother; Seizures in her cousin. There is no history of Esophageal cancer, Stomach cancer, or Rectal cancer.  ROS:    Please see the history of present illness.    All other systems reviewed and are negative.  EKGs/Labs/Other Studies Reviewed:    The following studies were reviewed today:  EKG Interpretation Date/Time:  Tuesday July 20 2024 15:47:47 EST Ventricular Rate:  55 PR Interval:  144 QRS Duration:  132 QT Interval:  446 QTC Calculation: 426 R Axis:   187  Text Interpretation: Sinus bradycardia Right bundle branch block Inferior infarct (cited on or before 20-Jul-2024) Cannot rule out Anterior infarct , age undetermined When compared with ECG of 17-May-2018 11:03, Premature atrial complexes are no longer Present Right bundle branch block is now Present Minimal criteria for Anterior infarct are now Present Confirmed by Edwyna Backers [phone]) on 07/20/2024 3:57:40 PM     Recent Labs: 05/27/2024: TSH 1.560  Recent Lipid Panel    Component Value Date/Time   CHOL 180 02/25/2017 1352   TRIG 271 (H) 05/17/2018 2133   HDL 55 02/25/2017 1352   CHOLHDL 3.3 02/25/2017 1352   CHOLHDL 4.5 CALC 12/02/2006 1522   VLDL 12 12/02/2006 1522   LDLCALC 109 (H) 02/25/2017 1352    Physical Exam:    VS:  BP (!) 140/90 Comment: Pt  has not taken pm dose of medication. Advise to keep a BP log and send to Dr. Edwyna.  Pulse (!) 55   Ht 5' 3 (1.6 m)   Wt 234 lb 1.3 oz (106.2 kg)   LMP  (LMP Unknown)   SpO2 97%   BMI 41.47 kg/m     Wt Readings from Last 3 Encounters:  07/20/24 234 lb 1.3 oz (106.2 kg)  07/06/24 228 lb (103.4 kg)  06/10/24 228 lb (103.4 kg)     GEN: Patient is in no acute distress HEENT: Normal NECK: No JVD; No carotid bruits LYMPHATICS: No lymphadenopathy CARDIAC: S1 S2 regular, 2/6 systolic murmur at the apex. RESPIRATORY:  Clear to auscultation without rales, wheezing or rhonchi  ABDOMEN: Soft, non-tender, non-distended MUSCULOSKELETAL:  No edema; No deformity  SKIN: Warm and dry NEUROLOGIC:  Alert and oriented x 3 PSYCHIATRIC:  Normal affect    Signed, Carly JONELLE Edwyna, MD  07/20/2024 4:24 PM    [facility]

## 2024-07-21 ENCOUNTER — Ambulatory Visit (HOSPITAL_BASED_OUTPATIENT_CLINIC_OR_DEPARTMENT_OTHER)
Admission: RE | Admit: 2024-07-21 | Discharge: 2024-07-21 | Disposition: A | Discharge status: Home / Self Care | Source: Ambulatory Visit | Attending: Cardiology | Admitting: Cardiology

## 2024-07-21 ENCOUNTER — Ambulatory Visit (HOSPITAL_BASED_OUTPATIENT_CLINIC_OR_DEPARTMENT_OTHER)
Admission: RE | Admit: 2024-07-21 | Discharge: 2024-07-21 | Disposition: A | Discharge status: Home / Self Care | Payer: Self-pay | Source: Ambulatory Visit | Attending: Cardiology | Admitting: Cardiology

## 2024-07-21 DIAGNOSIS — R011 Cardiac murmur, unspecified: Secondary | ICD-10-CM | POA: Diagnosis present

## 2024-07-21 DIAGNOSIS — E7800 Pure hypercholesterolemia, unspecified: Secondary | ICD-10-CM | POA: Insufficient documentation

## 2024-07-21 LAB — ECHOCARDIOGRAM COMPLETE
AR max vel: 2.05 cm2
AV Area VTI: 2.21 cm2
AV Area mean vel: 2.12 cm2
AV Mean grad: 3 mmHg
AV Peak grad: 5.9 mmHg
Ao pk vel: 1.21 m/s
Area-P 1/2: 4.17 cm2
Calc EF: 66.1 %
MV M vel: 3.78 m/s
MV Peak grad: 57.2 mmHg
S' Lateral: 2.8 cm
Single Plane A2C EF: 66.3 %
Single Plane A4C EF: 67.4 %

## 2024-07-22 ENCOUNTER — Ambulatory Visit: Payer: Self-pay | Admitting: Cardiology

## 2024-07-22 LAB — INSULIN, RANDOM: INSULIN: 12.9 u[IU]/mL (ref 2.6–24.9)

## 2024-07-22 LAB — BASIC METABOLIC PANEL WITH GFR
BUN/Creatinine Ratio: 19 (ref 9–23)
BUN: 18 mg/dL (ref 6–24)
CO2: 25 mmol/L (ref 20–29)
Calcium: 9.7 mg/dL (ref 8.7–10.2)
Chloride: 102 mmol/L (ref 96–106)
Creatinine, Ser: 0.93 mg/dL (ref 0.57–1.00)
Glucose: 85 mg/dL (ref 70–99)
Potassium: 4.6 mmol/L (ref 3.5–5.2)
Sodium: 141 mmol/L (ref 134–144)
eGFR: 73 mL/min/1.73 (ref >59)

## 2024-07-25 ENCOUNTER — Ambulatory Visit: Payer: Self-pay | Admitting: Family Medicine

## 2024-07-26 ENCOUNTER — Telehealth: Payer: Self-pay | Admitting: Cardiology

## 2024-07-26 NOTE — Telephone Encounter (Signed)
 Caller (Diane) called to confirm Dr. Edwyna can see patient's added Calcium  Score report dated 07/23/24.

## 2024-08-02 NOTE — Telephone Encounter (Signed)
-----   Message from Jennifer SAUNDERS Revankar sent at 07/28/2024  9:33 AM EST ----- Patient has a significant abnormal mass in the left lobe of the liver.  This needs to be evaluated with an MRI.  Please call the doctor's office and talk to the nurse and document.  Please call the  patient and let them know about this she so can get this further evaluated.  Also similarly for an issue in the lung.  Kindly document all of these conversations.  Thank you Jennifer SAUNDERS Crape, MD 07/28/2024 9:32 AM  ----- Message ----- From: Interface, Rad Results In Sent: 07/23/2024   3:43 PM EST To: Jennifer SAUNDERS Crape, MD

## 2024-08-02 NOTE — Telephone Encounter (Signed)
 Pt has a full voicemail.  Called Dr. Joleen office left message with staff regarding result and that pt needs follow up appointment. Result sent with note as well.

## 2024-08-03 ENCOUNTER — Ambulatory Visit: Admitting: Family Medicine

## 2024-08-03 ENCOUNTER — Encounter: Payer: Self-pay | Admitting: Family Medicine

## 2024-08-03 VITALS — BP 148/83 | HR 58 | Temp 98.5°F | Ht 63.0 in | Wt 227.0 lb

## 2024-08-03 DIAGNOSIS — N1831 Chronic kidney disease, stage 3a: Secondary | ICD-10-CM | POA: Diagnosis not present

## 2024-08-03 DIAGNOSIS — E88819 Insulin resistance, unspecified: Secondary | ICD-10-CM

## 2024-08-03 DIAGNOSIS — R7303 Prediabetes: Secondary | ICD-10-CM

## 2024-08-03 DIAGNOSIS — E66813 Obesity, class 3: Secondary | ICD-10-CM

## 2024-08-03 DIAGNOSIS — I129 Hypertensive chronic kidney disease with stage 1 through stage 4 chronic kidney disease, or unspecified chronic kidney disease: Secondary | ICD-10-CM

## 2024-08-03 DIAGNOSIS — Z6841 Body Mass Index (BMI) 40.0 and over, adult: Secondary | ICD-10-CM

## 2024-08-03 DIAGNOSIS — I10 Essential (primary) hypertension: Secondary | ICD-10-CM

## 2024-08-03 MED ORDER — METFORMIN HCL ER 500 MG PO TB24: 500.0000 mg | ORAL_TABLET | Freq: Every day | ORAL | 1 refills | Status: DC

## 2024-08-03 NOTE — Progress Notes (Signed)
 Office: [phone]  /  Fax: [phone]  WEIGHT SUMMARY AND BIOMETRICS  Starting Date: 05/27/24  Starting Weight: 235lb   Weight Lost Since Last Visit: 1lb   Vitals Temp: 98.5 F (36.9 C) BP: (!) 148/83 Pulse Rate: (!) 58 SpO2: 99 %   Body Composition  Body Fat %: 45.6 % Fat Mass (lbs): 103.6 lbs Muscle Mass (lbs): 117.6 lbs Total Body Water  (lbs): 80.8 lbs Visceral Fat Rating : 14     HPI  Chief Complaint: OBESITY  Carly [name] is here to discuss her progress with her obesity treatment plan. She is on the the [facility] and states she is following her eating plan approximately 93 % of the time. She states she is exercising 45 minutes 3 times per week.  Interval History:  Since last office visit she is down 1 lb She is down 0.2 pounds of muscle mass and down 1 pound of body fat since last visit Gives her a net weight loss of 8 pounds in the past 2 months and medically supervised weight management This is a 3.4% total body weight loss She is mostly eating on the pescatarian meal plan which feels like a lot of food She did start Metformin  XR 500 mg daily around 2pm with one episode of dizziness She has been getting to the gym 1-3 x a week She likes to do the bike up to 5 miles, not doing any resistance training Her husband is supportive, post-bariatric surgery  Pharmacotherapy: Metformin  XR 500 mg once daily for insulin  resistance  PHYSICAL EXAM:  Blood pressure (!) 148/83, pulse (!) 58, temperature 98.5 F (36.9 C), height 5' 3 (1.6 m), weight 227 lb (103 kg), SpO2 99%. Body mass index is 40.21 kg/m.  General: She is overweight, cooperative, alert, well developed, and in no acute distress. PSYCH: Has normal mood, affect and thought process.   Lungs: Normal breathing effort, no conversational dyspnea.   ASSESSMENT AND PLAN  TREATMENT PLAN FOR OBESITY:  Recommended Dietary Goals  Nathalee is currently in the action stage of change. As such, her goal  is to continue weight management plan. She has agreed to keeping a food journal and adhering to recommended goals of 1300 calories and 80 g of protein. Recommend a meal plan for 1300-calorie low-sodium pescatarian meal plan  Behavioral Intervention  We discussed the following Behavioral Modification Strategies today: increasing vegetables, avoiding skipping meals, increasing water  intake , keeping healthy foods at home, practice mindfulness eating and understand the difference between hunger signals and cravings, avoiding temptations and identifying enticing environmental cues, planning for success, and celebration eating strategies.  Additional resources provided today: NA  Recommended Physical Activity Goals  [name] has been advised to work up to 150 minutes of moderate intensity aerobic activity a week and strengthening exercises 2-3 times per week for cardiovascular health, weight loss maintenance and preservation of muscle mass.   She has agreed to [employer] strengthening exercises with a goal of 2-3 sessions a week  and Increase the intensity, frequency or duration of aerobic exercises   Add in body weight training, core exercises 10 minutes at the gym 3 days a week Aim for exercise bike during gym workouts at least 3 days a week.  Pharmacotherapy changes for the treatment of obesity: none  ASSOCIATED CONDITIONS ADDRESSED TODAY  Insulin  resistance Improving.  Reviewed labs with patient from last visit.  Fasting insulin  droppedFrom 21.2-12.9 with dietary change, improve levels of physical activity.  She has started metformin  in the past  month at 500 mg XR once daily  We discussed reducing bread intake, increasing walking frequency, gym workout time -     metFORMIN  HCl ER; Take 1 tablet (500 mg total) by mouth daily with breakfast.  Dispense: 30 tablet; Refill: 1  Prediabetes Lab Results  Component Value Date   HGBA1C 6.0 (H) 05/27/2024  Continue active plan for weight reduction with a  goal of 10% or more total body weight loss Repeat renal function the next 1 to 2 months on metformin  XR 500 mg once daily  -     metFORMIN  HCl ER; Take 1 tablet (500 mg total) by mouth daily with breakfast.  Dispense: 30 tablet; Refill: 1  Class 3 severe obesity due to excess calories with serious comorbidity and body mass index (BMI) of 40.0 to 44.9 in adult [facility])  Hypertension, unspecified type Blood pressure is elevated today Antihypertensive medications were not changed by her recent cardiology visit Echocardiogram reviewed from 07/21/2024 showing a normal left ventricular ejection fraction of 60 to 65% and cardiac CT scoring: 0.  Cardiology office visit notes reviewed from 07/20/2024.  She recently started monitoring home blood pressure readings noting systolic reading of 120 this morning with a diastolic in the 80s. Continue to monitor home blood pressures 2 days a week while at rest.  Continue all listed antihypertensive medication and follow-up with primary care.  CKD stage 3a, GFR 45-59 ml/min (HCC) Renal function has been stable.  Closely monitor renal function with addition of metformin  the next 1 to 2 months.     She was informed of the importance of frequent follow up visits to maximize her success with intensive lifestyle modifications for her multiple health conditions.   ATTESTASTION STATEMENTS:  Reviewed by clinician on day of visit: allergies, medications, problem list, medical history, surgical history, family history, social history, and previous encounter notes pertinent to obesity diagnosis.   I have personally spent 30 minutes total time today in preparation, patient care, nutritional counseling and education,  and documentation for this visit, including the following: review of most recent clinical lab tests, prescribing medications/ refilling medications, reviewing medical assistant documentation, review and interpretation of bioimpedence results.     Darice Haddock,  D.O. DABFM, DABOM [facility] [address] [place], [place] [address] [phone]

## 2024-08-03 NOTE — Patient Instructions (Addendum)
 Check out Chat GPT with search: 1300 calorie low sodium pescatarian meal plan  Watch portion sizes and snacks Limit bread intake Watch out for sweet treats around the holidays  Aim for gym workouts 3 days/ wk Bike 30-40 min + body weight training 15 min  Great job on cardiac testing!!  Move metformin  XR 500 mg to DINNERtime

## 2024-08-04 ENCOUNTER — Encounter: Payer: Self-pay | Admitting: Neurology

## 2024-09-14 ENCOUNTER — Ambulatory Visit (INDEPENDENT_AMBULATORY_CARE_PROVIDER_SITE_OTHER): Admitting: Family Medicine

## 2024-09-14 ENCOUNTER — Encounter: Payer: Self-pay | Admitting: Family Medicine

## 2024-09-14 VITALS — BP 136/78 | HR 61 | Temp 98.3°F | Ht 63.0 in | Wt 230.0 lb

## 2024-09-14 DIAGNOSIS — Z6841 Body Mass Index (BMI) 40.0 and over, adult: Secondary | ICD-10-CM

## 2024-09-14 DIAGNOSIS — E88819 Insulin resistance, unspecified: Secondary | ICD-10-CM

## 2024-09-14 DIAGNOSIS — R7303 Prediabetes: Secondary | ICD-10-CM | POA: Diagnosis not present

## 2024-09-14 DIAGNOSIS — N1831 Chronic kidney disease, stage 3a: Secondary | ICD-10-CM

## 2024-09-14 MED ORDER — METFORMIN HCL ER 500 MG PO TB24: 500.0000 mg | ORAL_TABLET | Freq: Every day | ORAL | 1 refills | Status: DC

## 2024-09-14 NOTE — Progress Notes (Signed)
 "  Office: [phone]  /  Fax: [phone]  WEIGHT SUMMARY AND BIOMETRICS  Starting Date: 05/27/24  Starting Weight: 235lb   Weight Lost Since Last Visit: 0lb   Vitals Temp: 98.3 F (36.8 C) BP: 136/78 Pulse Rate: 61 SpO2: 97 %   Body Composition  Body Fat %: 46.9 % Fat Mass (lbs): 107.8 lbs Muscle Mass (lbs): 116 lbs Total Body Water  (lbs): 84.4 lbs Visceral Fat Rating : 15    HPI  Chief Complaint: OBESITY  Carly [name] is here to discuss her progress with her obesity treatment plan. She is on the the [facility] and states she is following her eating plan approximately 85-90 % of the time. She states she is doing a vibration plate at night time.   Interval History:  Since last office visit she is up 3 lb She has a net weight loss of 5 lb in 3 mos of medically supervised weight management She typically has breakfast at 10:30 am, lunch at 2pm and dinner around 7:30 pm Goes to bed around 11:30 pm She has not been getting to the gym  She is not tracking her calories She has no coverage for anti obesity medications  Pharmacotherapy: none  PHYSICAL EXAM:  Blood pressure 136/78, pulse 61, temperature 98.3 F (36.8 C), height 5' 3 (1.6 m), weight 230 lb (104.3 kg), SpO2 97%. Body mass index is 40.74 kg/m.  General: She is overweight, cooperative, alert, well developed, and in no acute distress. PSYCH: Has normal mood, affect and thought process.   Lungs: Normal breathing effort, no conversational dyspnea.   ASSESSMENT AND PLAN  TREATMENT PLAN FOR OBESITY:  Recommended Dietary Goals  Carly [name] is currently in the action stage of change. As such, her goal is to continue weight management plan. She has agreed to the [employer].  Behavioral Intervention  We discussed the following Behavioral Modification Strategies today: increasing lean protein intake to established goals, increasing fiber rich foods, increasing water  intake , work on meal planning and  preparation, keeping healthy foods at home, work on managing stress, creating time for self-care and relaxation, continue to work on implementation of reduced calorie nutritional plan, continue to practice mindfulness when eating, and planning for success.  Additional resources provided today: NA  Recommended Physical Activity Goals  Carly [name] has been advised to work up to 150 minutes of moderate intensity aerobic activity a week and strengthening exercises 2-3 times per week for cardiovascular health, weight loss maintenance and preservation of muscle mass.   She has agreed to [employer] strengthening exercises with a goal of 2-3 sessions a week  and Start aerobic activity with a goal of 150 minutes a week at moderate intensity.  - resume silver sneakers/ bike/ group workouts at [employer] for a combination of 3-4 days/ wk  Pharmacotherapy changes for the treatment of obesity: none  ASSOCIATED CONDITIONS ADDRESSED TODAY  CKD stage 3a, GFR 45-59 ml/min (HCC) -     Amb ref to Medical Nutrition Therapy-MNT  Insulin  resistance -     metFORMIN  HCl ER; Take 1 tablet (500 mg total) by mouth daily with breakfast.  Dispense: 30 tablet; Refill: 1 Tolerating metformin  500 mg daily with food well Repeat BMP, fasting insulin  next visit Improve compliance on meal plan, reducing adding sugar and ramp up exercise time  Prediabetes -     metFORMIN  HCl ER; Take 1 tablet (500 mg total) by mouth daily with breakfast.  Dispense: 30 tablet; Refill: 1 -     Amb  ref to Medical Nutrition Therapy-MNT Lab Results  Component Value Date   HGBA1C 6.0 (H) 05/27/2024   Work on [employer] reduction Refer for MNT Continue metformin   Morbid obesity (HCC) Reviewed plan of care with patient Further management options limited due to current medical conditions, medications and cost.     She was informed of the importance of frequent follow up visits to maximize her success with intensive lifestyle modifications for her  multiple health conditions.   ATTESTASTION STATEMENTS:  Reviewed by clinician on day of visit: allergies, medications, problem list, medical history, surgical history, family history, social history, and previous encounter notes pertinent to obesity diagnosis.   I have personally spent 30 minutes total time today in preparation, patient care, nutritional counseling and education,  and documentation for this visit, including the following: review of most recent clinical lab tests, prescribing medications/ refilling medications, reviewing medical assistant documentation, review and interpretation of bioimpedence results.     Carly [name], D.O. DABFM, DABOM [facility] [address] [place], [place] [address] [phone] "

## 2024-10-05 ENCOUNTER — Encounter: Payer: Self-pay | Admitting: Dietician

## 2024-10-05 ENCOUNTER — Encounter: Attending: Family Medicine | Admitting: Dietician

## 2024-10-05 VITALS — Wt 233.0 lb

## 2024-10-05 DIAGNOSIS — I129 Hypertensive chronic kidney disease with stage 1 through stage 4 chronic kidney disease, or unspecified chronic kidney disease: Secondary | ICD-10-CM | POA: Insufficient documentation

## 2024-10-05 DIAGNOSIS — Z713 Dietary counseling and surveillance: Secondary | ICD-10-CM | POA: Diagnosis not present

## 2024-10-05 DIAGNOSIS — R7303 Prediabetes: Secondary | ICD-10-CM | POA: Diagnosis present

## 2024-10-05 DIAGNOSIS — Z7984 Long term (current) use of oral hypoglycemic drugs: Secondary | ICD-10-CM | POA: Insufficient documentation

## 2024-10-05 DIAGNOSIS — Z6841 Body Mass Index (BMI) 40.0 and over, adult: Secondary | ICD-10-CM | POA: Diagnosis not present

## 2024-10-05 DIAGNOSIS — N1831 Chronic kidney disease, stage 3a: Secondary | ICD-10-CM | POA: Insufficient documentation

## 2024-10-05 NOTE — Progress Notes (Signed)
 Medical Nutrition Therapy  Appointment Start time:  1115  Appointment End time:  1200  Primary concerns today: weight loss plateau.    Referral diagnosis: prediabetes Preferred learning style: no preference indicated Learning readiness: ready   NUTRITION ASSESSMENT   Anthropometrics   Wt 10/05/24: 233 lb  Clinical Medical Hx: reviewed; anemia, anxiety, GERD, prediabetes, HLD, HTN, kidney disease Medications: metformin  Labs: reviewed; 05/27/24: A1c 6%, Notable Signs/Symptoms: none reported Food Allergies: none reported  Lifestyle & Dietary Hx  Pt reports she started metformin  2 months ago.   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 Pt reports she stopped eating beef/pork, and eventually moved to a pescatarian diet (last 6 months).   Pt reports she is working with [facility]. Pt reports she was told to eat 1300 calories. Pt reports sometimes she skips meals. Pt reports she was given a meal plan but does not know how to do it.   Pt reports she stays up watching TV or on her phone, and sleeps around 5 hours.   Pt reports she was doing silver sneakers 3x/wk but fell out of the habit. Pt reports she did the stationary bike last week 3 times. Pt reports she likes exercising but has been inconsistent. Pt reports she is not allowed to lift more than 10 lb weights.   Estimated daily fluid intake: 64 oz Supplements: biotin, MVI, vitamin C Sleep: 5 hours sleep Stress / self-care: low to moderate stress Current average weekly physical activity: Sometimes bikes at gym, has been inconsistent but went 3 times last week.   24-Hr Dietary Recall First Meal: 11am: peanut butter sandwich, cup of milk, and orange OR 3 eggs and an orange Snack: none Second Meal: boca burger on 2 slices low cal bread, 2 slices cheese, orange Snack: lentil soup Third Meal: boca burger one bread with cheese Snack: none Beverages: water , 1 c milk,  occasional orange juice, decaf coffee with splenda and creamer   NUTRITION DIAGNOSIS  NB-1.1 Food and nutrition-related knowledge deficit As related to lack of prior education by a registered dietitian.  As evidenced by pt report.   NUTRITION INTERVENTION  Nutrition education (E-1) on the following topics:   Exercise Finding an exercise you enjoy is crucial for maintaining long-term fitness and overall health. Enjoyable activities are more likely to become regular habits, making it easier to stay consistent with physical activity. When you look forward to your workouts, exercise becomes a positive experience rather than a chore, reducing the likelihood of burnout or quitting. Enjoyable exercise also enhances mental well-being, as engaging in activities you love can boost mood, reduce stress, and provide a sense of accomplishment. Aim for 150 minutes of physical activity weekly. Make physical activity a part of your week. Try to include at least 30 minutes of physical activity 5 days each week or at least 150 minutes per week. Regular physical activity promotes overall health-including helping to reduce risk for heart disease and diabetes, promoting mental health, and helping us  sleep better.     Plate Method Fruits & Vegetables: Aim to fill half your plate with a variety of fruits and vegetables. They are rich in vitamins, minerals, and fiber, and can help reduce the risk of chronic diseases. Choose a colorful assortment of fruits and vegetables to ensure you get a wide range of nutrients. Grains and Starches: Make at least half of your grain choices whole grains, such as brown rice, whole wheat bread,  and oats. Whole grains provide fiber, which aids in digestion and healthy cholesterol levels. Aim for whole forms of starchy vegetables such as potatoes, sweet potatoes, beans, peas, and corn, which are fiber rich and provide many vitamins and minerals.  Protein: Incorporate lean sources of protein,  such as poultry, fish, beans, nuts, and seeds, into your meals. Protein is essential for building and repairing tissues, staying full, balancing blood sugar, as well as supporting immune function. Dairy: Include low-fat or fat-free dairy products like milk, yogurt, and cheese in your diet. Dairy foods are excellent sources of calcium  and vitamin D , which are crucial for bone health.   Sleep and Nutrition Impact of sleep on nutrition: Inadequate sleep can have significant impacts on nutrition by increasing appetite and cravings for high-calorie foods, altering food choices, and disrupting metabolic processes. This can lead to weight gain, poor glucose management, and impaired nutrient absorption. Additionally, sleep deprivation often results in reduced physical activity, further affecting overall health. Tips for better sleep: To improve sleep quality, maintain a consistent sleep schedule and create a relaxing bedtime routine. Optimize your sleep environment by keeping it cool, dark, and quiet, and limit screen time before bed. Be mindful of food and drink intake, engage in regular physical activity, and manage stress effectively. By prioritizing better sleep habits, you can positively influence your nutrition and overall well-being.   Handouts Provided Include  Sleep Hygiene  Plate Method  Learning Style & Readiness for Change Teaching method utilized: Visual & Auditory  Demonstrated degree of understanding via: Teach Back  Barriers to learning/adherence to lifestyle change: none  Goals Established by Pt  Goal 1: Aim for at least 7 hours of sleep nightly.   Goal 2: Go to the gym at least 3 days a week for at least 30-45 minutes.   Goal 3: include non-starchy vegetables with lunch and dinner.    MONITORING & EVALUATION Dietary intake, weekly physical activity, and follow up PRN.  Next Steps  Patient is to call for questions.

## 2024-10-13 ENCOUNTER — Ambulatory Visit: Admitting: Family Medicine

## 2024-10-13 ENCOUNTER — Encounter: Payer: Self-pay | Admitting: Family Medicine

## 2024-10-13 VITALS — BP 131/78 | HR 55 | Ht 63.0 in | Wt 230.0 lb

## 2024-10-13 DIAGNOSIS — N1831 Chronic kidney disease, stage 3a: Secondary | ICD-10-CM

## 2024-10-13 DIAGNOSIS — E88819 Insulin resistance, unspecified: Secondary | ICD-10-CM

## 2024-10-13 DIAGNOSIS — Z6841 Body Mass Index (BMI) 40.0 and over, adult: Secondary | ICD-10-CM | POA: Diagnosis not present

## 2024-10-13 DIAGNOSIS — R7303 Prediabetes: Secondary | ICD-10-CM

## 2024-10-13 MED ORDER — METFORMIN HCL ER 500 MG PO TB24: 500.0000 mg | ORAL_TABLET | Freq: Every day | ORAL | 1 refills | Status: AC

## 2024-10-13 NOTE — Progress Notes (Signed)
 "  Office: [phone]  /  Fax: [phone]  WEIGHT SUMMARY AND BIOMETRICS  Starting Date: 05/27/24  Starting Weight: 235lb   Weight Lost Since Last Visit: 0lb   Vitals BP: 131/78 Pulse Rate: (!) 55 SpO2: 99 %   Body Composition  Body Fat %: 46.8 % Fat Mass (lbs): 107.8 lbs Muscle Mass (lbs): 116.4 lbs Total Body Water  (lbs): 84.6 lbs Visceral Fat Rating : 15    HPI  Chief Complaint: OBESITY  Carly [name] is here to discuss her progress with her obesity treatment plan. She is on the the [facility] and states she is following her eating plan approximately 97 % of the time. She states she is walking for 30 minutes 3 times per week.  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 Body mass index is 40.74 kg/m.  General: She is overweight, cooperative, alert, well developed, and in no acute distress. PSYCH: Has normal mood, affect and thought process.   Lungs: Normal breathing effort, no conversational dyspnea.   ASSESSMENT AND PLAN  TREATMENT PLAN FOR OBESITY:  Recommended Dietary Goals  Carly [name] is currently in the action stage of change. As such, her goal is to continue weight management plan. She has agreed to keeping a food journal and adhering to recommended goals of 1200-1400 calories and 80 g of  protein. - REE: 1440 < 4,000 steps/ day  Behavioral Intervention  We discussed the following Behavioral Modification Strategies today:  increasing lean protein intake to established goals, decreasing simple carbohydrates , increasing fiber rich foods, increasing water  intake , keeping healthy foods at home, and planning for success. Work on dietary change goals discussed with RD at 1/20 visit  Additional resources provided today: NA  Recommended Physical Activity Goals  Carly [name] has been advised to work up to 150 minutes of moderate intensity aerobic activity a week and strengthening exercises 2-3 times per week for cardiovascular health, weight loss maintenance and preservation of muscle mass.   She has agreed to Increase the intensity, frequency or duration of aerobic exercises   Increase gym to 3 days/ wk OK to add in body weight training for resistance  Pharmacotherapy changes for the treatment of obesity: none  ASSOCIATED CONDITIONS ADDRESSED TODAY  CKD stage 3a, GFR 45-59 ml/min (HCC) Stable but will repeat today given new onset use of metformin  for prediabetes. Keep total daily protein intake goal 0.8 g/ kg of body weight or ~80 g/ day -     Basic metabolic panel with GFR  Insulin  resistance Repeat fasting insulin  today.  Tolerating metformin  XR 500 mg daily without problems.  Slowly seeing some weight reduction.  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  Work on increasing NEAT. -     metFORMIN  HCl ER; Take 1 tablet (500 mg total) by mouth daily with breakfast.  Dispense: 30 tablet; Refill: 1 -  Insulin , random  Prediabetes Lab Results  Component Value Date   HGBA1C 6.0 (H) 05/27/2024  Repeat A1c today She unfortunately does not qualify for a GLP-1 RA with prediabetes Continue to work on teacher, music She did not set up RD follow up Doing well on metformin  -     metFORMIN  HCl ER; Take 1 tablet (500 mg total) by mouth daily with breakfast.  Dispense: 30 tablet; Refill: 1 -     Hemoglobin A1c  Morbid obesity (HCC) We discussed options given her 5 lb weight loss in 4 mos She does  qualify for bariatric surgery with a BMI of 40 but has complicated medical history making her higher risk.    BMI 40.0-44.9, adult [facility])      She was informed of the importance of frequent follow up visits to maximize her success with intensive lifestyle modifications for her multiple health conditions.   ATTESTASTION STATEMENTS:  Reviewed by clinician on day of visit: allergies, medications, problem list, medical history, surgical history, family history, social history, and previous encounter notes pertinent to obesity diagnosis.   I have personally spent 30 minutes total time today in preparation, patient care, nutritional counseling and education,  and documentation for this visit, including the following: review of most recent clinical lab tests, prescribing medications/ refilling medications, reviewing medical assistant documentation, review and interpretation of bioimpedence results.     Carly [name], D.O. DABFM, DABOM [facility] [address] [place], [place] [address] [phone] "

## 2024-10-13 NOTE — Patient Instructions (Signed)
 Stick to dietary plan outlined by dietician Track calories using the [employer] app on your phone Aim for 1200-1400  calories per day Don't add workouts into the app Protein goal should be ~70-80 grams per day Daily fiber goal: 25-30 grams per day Hydrate well with water  Aim for gym workouts 3 days/ wk

## 2024-10-14 LAB — BASIC METABOLIC PANEL WITH GFR
BUN/Creatinine Ratio: 17 (ref 9–23)
BUN: 18 mg/dL (ref 6–24)
CO2: 25 mmol/L (ref 20–29)
Calcium: 9.8 mg/dL (ref 8.7–10.2)
Chloride: 101 mmol/L (ref 96–106)
Creatinine, Ser: 1.03 mg/dL — ABNORMAL HIGH (ref 0.57–1.00)
Glucose: 86 mg/dL (ref 70–99)
Potassium: 4.7 mmol/L (ref 3.5–5.2)
Sodium: 143 mmol/L (ref 134–144)
eGFR: 64 mL/min/{1.73_m2}

## 2024-10-14 LAB — INSULIN, RANDOM: INSULIN: 13.8 u[IU]/mL (ref 2.6–24.9)

## 2024-10-14 LAB — HEMOGLOBIN A1C
Est. average glucose Bld gHb Est-mCnc: 123 mg/dL
Hgb A1c MFr Bld: 5.9 % — ABNORMAL HIGH (ref 4.8–5.6)

## 2024-10-15 ENCOUNTER — Ambulatory Visit: Payer: Self-pay | Admitting: Family Medicine

## 2024-11-15 ENCOUNTER — Ambulatory Visit: Admitting: Family Medicine

## 2025-03-04 ENCOUNTER — Ambulatory Visit: Admitting: Neurology

## 2025-03-07 ENCOUNTER — Ambulatory Visit: Admitting: Neurology
